# Patient Record
Sex: Female | Born: 1950
Health system: Southern US, Community
[De-identification: ages and names within clinical notes are randomized; demographics above are authoritative.]

## PROBLEM LIST (undated history)

## (undated) DIAGNOSIS — R9389 Abnormal findings on diagnostic imaging of other specified body structures: Secondary | ICD-10-CM

## (undated) DIAGNOSIS — R634 Abnormal weight loss: Secondary | ICD-10-CM

## (undated) DIAGNOSIS — K5909 Other constipation: Secondary | ICD-10-CM

## (undated) DIAGNOSIS — R110 Nausea: Secondary | ICD-10-CM

## (undated) DIAGNOSIS — Z9889 Other specified postprocedural states: Secondary | ICD-10-CM

## (undated) DIAGNOSIS — M1990 Unspecified osteoarthritis, unspecified site: Secondary | ICD-10-CM

## (undated) DIAGNOSIS — E079 Disorder of thyroid, unspecified: Secondary | ICD-10-CM

## (undated) DIAGNOSIS — F419 Anxiety disorder, unspecified: Secondary | ICD-10-CM

## (undated) DIAGNOSIS — K2970 Gastritis, unspecified, without bleeding: Secondary | ICD-10-CM

## (undated) DIAGNOSIS — K921 Melena: Secondary | ICD-10-CM

## (undated) DIAGNOSIS — E119 Type 2 diabetes mellitus without complications: Secondary | ICD-10-CM

## (undated) DIAGNOSIS — B9681 Helicobacter pylori [H. pylori] as the cause of diseases classified elsewhere: Secondary | ICD-10-CM

## (undated) HISTORY — DX: Other constipation: K59.09

## (undated) HISTORY — DX: Gastritis, unspecified, without bleeding: K29.70

## (undated) HISTORY — DX: Disorder of thyroid, unspecified: E07.9

## (undated) HISTORY — DX: Nausea: R11.0

## (undated) HISTORY — PX: ABDOMINAL HYSTERECTOMY: SHX81

## (undated) HISTORY — DX: Helicobacter pylori (H. pylori) as the cause of diseases classified elsewhere: B96.81

## (undated) HISTORY — DX: Abnormal weight loss: R63.4

## (undated) HISTORY — PX: CHOLECYSTECTOMY: SHX55

## (undated) HISTORY — DX: Type 2 diabetes mellitus without complications: E11.9

## (undated) HISTORY — DX: Melena: K92.1

## (undated) HISTORY — DX: Anxiety disorder, unspecified: F41.9

## (undated) HISTORY — DX: Other specified postprocedural states: Z98.890

---

## 1979-03-01 DIAGNOSIS — F419 Anxiety disorder, unspecified: Secondary | ICD-10-CM

## 1979-03-01 HISTORY — DX: Anxiety disorder, unspecified: F41.9

## 1987-07-01 HISTORY — PX: VESICOVAGINAL FISTULA CLOSURE W/ TAH: SUR271

## 2001-06-01 ENCOUNTER — Encounter: Payer: Self-pay | Admitting: Specialist

## 2001-06-01 ENCOUNTER — Ambulatory Visit (HOSPITAL_COMMUNITY): Admission: RE | Admit: 2001-06-01 | Discharge: 2001-06-01 | Payer: Self-pay | Admitting: Specialist

## 2001-09-14 ENCOUNTER — Ambulatory Visit (HOSPITAL_COMMUNITY): Admission: RE | Admit: 2001-09-14 | Discharge: 2001-09-14 | Payer: Self-pay | Admitting: Internal Medicine

## 2002-06-14 ENCOUNTER — Encounter: Payer: Self-pay | Admitting: Obstetrics and Gynecology

## 2002-06-14 ENCOUNTER — Ambulatory Visit (HOSPITAL_COMMUNITY): Admission: RE | Admit: 2002-06-14 | Discharge: 2002-06-14 | Payer: Self-pay | Admitting: Obstetrics and Gynecology

## 2002-08-05 ENCOUNTER — Encounter (HOSPITAL_COMMUNITY): Admission: RE | Admit: 2002-08-05 | Discharge: 2002-09-04 | Payer: Self-pay | Admitting: Cardiology

## 2003-06-19 ENCOUNTER — Ambulatory Visit (HOSPITAL_COMMUNITY): Admission: RE | Admit: 2003-06-19 | Discharge: 2003-06-19 | Payer: Self-pay | Admitting: Specialist

## 2003-07-01 HISTORY — PX: ESOPHAGOGASTRODUODENOSCOPY: SHX1529

## 2003-12-20 ENCOUNTER — Ambulatory Visit (HOSPITAL_COMMUNITY): Admission: RE | Admit: 2003-12-20 | Discharge: 2003-12-20 | Payer: Self-pay | Admitting: Pulmonary Disease

## 2004-04-30 DIAGNOSIS — B9681 Helicobacter pylori [H. pylori] as the cause of diseases classified elsewhere: Secondary | ICD-10-CM

## 2004-04-30 HISTORY — DX: Helicobacter pylori (H. pylori) as the cause of diseases classified elsewhere: B96.81

## 2004-05-15 ENCOUNTER — Ambulatory Visit (HOSPITAL_COMMUNITY): Payer: Self-pay | Admitting: Oncology

## 2004-05-15 ENCOUNTER — Encounter (HOSPITAL_COMMUNITY): Admission: RE | Admit: 2004-05-15 | Discharge: 2004-06-14 | Payer: Self-pay | Admitting: Oncology

## 2004-05-15 ENCOUNTER — Encounter: Admission: RE | Admit: 2004-05-15 | Discharge: 2004-05-15 | Payer: Self-pay | Admitting: Oncology

## 2004-05-27 ENCOUNTER — Ambulatory Visit: Payer: Self-pay | Admitting: Internal Medicine

## 2004-05-29 ENCOUNTER — Ambulatory Visit (HOSPITAL_COMMUNITY): Admission: RE | Admit: 2004-05-29 | Discharge: 2004-05-29 | Payer: Self-pay | Admitting: Internal Medicine

## 2004-07-03 ENCOUNTER — Ambulatory Visit: Payer: Self-pay | Admitting: Internal Medicine

## 2004-07-12 ENCOUNTER — Ambulatory Visit (HOSPITAL_COMMUNITY): Admission: RE | Admit: 2004-07-12 | Discharge: 2004-07-12 | Payer: Self-pay | Admitting: Specialist

## 2004-07-30 ENCOUNTER — Ambulatory Visit: Payer: Self-pay | Admitting: Internal Medicine

## 2004-08-28 ENCOUNTER — Ambulatory Visit: Payer: Self-pay | Admitting: Internal Medicine

## 2004-08-30 ENCOUNTER — Ambulatory Visit (HOSPITAL_COMMUNITY): Admission: RE | Admit: 2004-08-30 | Discharge: 2004-08-30 | Payer: Self-pay | Admitting: Internal Medicine

## 2004-10-10 ENCOUNTER — Ambulatory Visit (HOSPITAL_COMMUNITY): Admission: RE | Admit: 2004-10-10 | Discharge: 2004-10-10 | Payer: Self-pay | Admitting: Pulmonary Disease

## 2004-10-21 ENCOUNTER — Ambulatory Visit: Payer: Self-pay | Admitting: Internal Medicine

## 2005-05-20 ENCOUNTER — Ambulatory Visit (HOSPITAL_COMMUNITY): Admission: RE | Admit: 2005-05-20 | Discharge: 2005-05-20 | Payer: Self-pay | Admitting: Pulmonary Disease

## 2005-06-30 HISTORY — PX: OTHER SURGICAL HISTORY: SHX169

## 2005-07-30 ENCOUNTER — Ambulatory Visit: Payer: Self-pay | Admitting: Internal Medicine

## 2005-08-04 ENCOUNTER — Ambulatory Visit (HOSPITAL_COMMUNITY): Admission: RE | Admit: 2005-08-04 | Discharge: 2005-08-04 | Payer: Self-pay | Admitting: Internal Medicine

## 2005-08-13 ENCOUNTER — Encounter: Admission: RE | Admit: 2005-08-13 | Discharge: 2005-08-13 | Payer: Self-pay | Admitting: Obstetrics and Gynecology

## 2005-08-14 ENCOUNTER — Ambulatory Visit: Payer: Self-pay | Admitting: Internal Medicine

## 2005-08-26 ENCOUNTER — Encounter: Admission: RE | Admit: 2005-08-26 | Discharge: 2005-08-26 | Payer: Self-pay | Admitting: Obstetrics and Gynecology

## 2005-08-28 ENCOUNTER — Ambulatory Visit (HOSPITAL_COMMUNITY): Admission: RE | Admit: 2005-08-28 | Discharge: 2005-08-28 | Payer: Self-pay | Admitting: Internal Medicine

## 2005-08-28 ENCOUNTER — Ambulatory Visit: Payer: Self-pay | Admitting: Internal Medicine

## 2005-08-28 DIAGNOSIS — Z9889 Other specified postprocedural states: Secondary | ICD-10-CM

## 2005-08-28 HISTORY — DX: Other specified postprocedural states: Z98.890

## 2006-02-04 ENCOUNTER — Encounter: Admission: RE | Admit: 2006-02-04 | Discharge: 2006-02-04 | Payer: Self-pay | Admitting: Obstetrics and Gynecology

## 2006-08-17 ENCOUNTER — Encounter: Admission: RE | Admit: 2006-08-17 | Discharge: 2006-08-17 | Payer: Self-pay | Admitting: Obstetrics and Gynecology

## 2007-01-29 ENCOUNTER — Encounter: Admission: RE | Admit: 2007-01-29 | Discharge: 2007-01-29 | Payer: Self-pay | Admitting: Obstetrics and Gynecology

## 2007-07-28 ENCOUNTER — Ambulatory Visit (HOSPITAL_COMMUNITY): Admission: RE | Admit: 2007-07-28 | Discharge: 2007-07-28 | Payer: Self-pay | Admitting: Pulmonary Disease

## 2007-08-20 ENCOUNTER — Encounter: Admission: RE | Admit: 2007-08-20 | Discharge: 2007-08-20 | Payer: Self-pay | Admitting: Obstetrics and Gynecology

## 2007-08-23 ENCOUNTER — Other Ambulatory Visit: Admission: RE | Admit: 2007-08-23 | Discharge: 2007-08-23 | Payer: Self-pay | Admitting: Obstetrics and Gynecology

## 2007-12-20 ENCOUNTER — Ambulatory Visit (HOSPITAL_COMMUNITY): Admission: RE | Admit: 2007-12-20 | Discharge: 2007-12-20 | Payer: Self-pay | Admitting: Pulmonary Disease

## 2008-08-21 ENCOUNTER — Encounter: Admission: RE | Admit: 2008-08-21 | Discharge: 2008-08-21 | Payer: Self-pay | Admitting: Obstetrics and Gynecology

## 2008-09-11 ENCOUNTER — Ambulatory Visit (HOSPITAL_COMMUNITY): Admission: RE | Admit: 2008-09-11 | Discharge: 2008-09-11 | Payer: Self-pay | Admitting: Pulmonary Disease

## 2009-01-24 ENCOUNTER — Encounter: Payer: Self-pay | Admitting: Family Medicine

## 2009-06-30 HISTORY — PX: COLONOSCOPY: SHX174

## 2009-08-16 ENCOUNTER — Ambulatory Visit: Payer: Self-pay | Admitting: Family Medicine

## 2009-08-16 ENCOUNTER — Encounter: Payer: Self-pay | Admitting: Physician Assistant

## 2009-08-16 ENCOUNTER — Telehealth: Payer: Self-pay | Admitting: Family Medicine

## 2009-08-16 DIAGNOSIS — F411 Generalized anxiety disorder: Secondary | ICD-10-CM | POA: Insufficient documentation

## 2009-08-16 DIAGNOSIS — F329 Major depressive disorder, single episode, unspecified: Secondary | ICD-10-CM | POA: Insufficient documentation

## 2009-08-16 DIAGNOSIS — R5381 Other malaise: Secondary | ICD-10-CM | POA: Insufficient documentation

## 2009-08-16 DIAGNOSIS — R5383 Other fatigue: Secondary | ICD-10-CM

## 2009-08-23 ENCOUNTER — Encounter: Admission: RE | Admit: 2009-08-23 | Discharge: 2009-08-23 | Payer: Self-pay | Admitting: Family Medicine

## 2009-08-27 LAB — CONVERTED CEMR LAB
BUN: 15 mg/dL (ref 6–23)
Basophils Absolute: 0 10*3/uL (ref 0.0–0.1)
Basophils Relative: 0 % (ref 0–1)
Bilirubin, Direct: 0.1 mg/dL (ref 0.0–0.3)
Creatinine, Ser: 0.75 mg/dL (ref 0.40–1.20)
Eosinophils Absolute: 0 10*3/uL (ref 0.0–0.7)
Eosinophils Relative: 0 % (ref 0–5)
Glucose, Bld: 92 mg/dL (ref 70–99)
HCT: 44.4 % (ref 36.0–46.0)
Monocytes Relative: 12 % (ref 3–12)
Neutrophils Relative %: 65 % (ref 43–77)
Platelets: 228 10*3/uL (ref 150–400)
RBC: 4.85 M/uL (ref 3.87–5.11)
TSH: 0.764 microintl units/mL (ref 0.350–4.500)
Total Bilirubin: 0.5 mg/dL (ref 0.3–1.2)
Vit D, 25-Hydroxy: 43 ng/mL (ref 30–89)
WBC: 6.1 10*3/uL (ref 4.0–10.5)

## 2009-09-24 ENCOUNTER — Ambulatory Visit: Payer: Self-pay | Admitting: Internal Medicine

## 2009-09-24 DIAGNOSIS — K5909 Other constipation: Secondary | ICD-10-CM | POA: Insufficient documentation

## 2009-09-24 DIAGNOSIS — K921 Melena: Secondary | ICD-10-CM | POA: Insufficient documentation

## 2009-09-24 DIAGNOSIS — R634 Abnormal weight loss: Secondary | ICD-10-CM | POA: Insufficient documentation

## 2009-09-24 DIAGNOSIS — R110 Nausea: Secondary | ICD-10-CM | POA: Insufficient documentation

## 2009-10-01 ENCOUNTER — Encounter: Payer: Self-pay | Admitting: Internal Medicine

## 2009-10-23 ENCOUNTER — Ambulatory Visit (HOSPITAL_COMMUNITY): Admission: RE | Admit: 2009-10-23 | Discharge: 2009-10-23 | Payer: Self-pay | Admitting: Internal Medicine

## 2009-10-23 ENCOUNTER — Ambulatory Visit: Payer: Self-pay | Admitting: Internal Medicine

## 2009-10-25 ENCOUNTER — Encounter: Payer: Self-pay | Admitting: Internal Medicine

## 2009-10-26 ENCOUNTER — Ambulatory Visit: Payer: Self-pay | Admitting: Family Medicine

## 2009-10-26 DIAGNOSIS — M25569 Pain in unspecified knee: Secondary | ICD-10-CM | POA: Insufficient documentation

## 2009-10-26 DIAGNOSIS — R519 Headache, unspecified: Secondary | ICD-10-CM | POA: Insufficient documentation

## 2009-10-26 DIAGNOSIS — R079 Chest pain, unspecified: Secondary | ICD-10-CM | POA: Insufficient documentation

## 2009-10-26 DIAGNOSIS — R51 Headache: Secondary | ICD-10-CM | POA: Insufficient documentation

## 2009-11-02 ENCOUNTER — Ambulatory Visit (HOSPITAL_COMMUNITY): Admission: RE | Admit: 2009-11-02 | Discharge: 2009-11-02 | Payer: Self-pay | Admitting: Family Medicine

## 2009-11-19 ENCOUNTER — Ambulatory Visit (HOSPITAL_COMMUNITY): Payer: Self-pay | Admitting: Psychiatry

## 2009-11-30 ENCOUNTER — Ambulatory Visit (HOSPITAL_COMMUNITY): Payer: Self-pay | Admitting: Psychiatry

## 2009-12-26 ENCOUNTER — Ambulatory Visit (HOSPITAL_COMMUNITY): Payer: Self-pay | Admitting: Psychiatry

## 2009-12-27 ENCOUNTER — Other Ambulatory Visit: Admission: RE | Admit: 2009-12-27 | Discharge: 2009-12-27 | Payer: Self-pay | Admitting: Family Medicine

## 2009-12-27 ENCOUNTER — Ambulatory Visit: Payer: Self-pay | Admitting: Family Medicine

## 2009-12-27 DIAGNOSIS — E782 Mixed hyperlipidemia: Secondary | ICD-10-CM | POA: Insufficient documentation

## 2009-12-27 DIAGNOSIS — E785 Hyperlipidemia, unspecified: Secondary | ICD-10-CM | POA: Insufficient documentation

## 2009-12-28 LAB — CONVERTED CEMR LAB
ALT: 19 units/L (ref 0–35)
Albumin: 4.5 g/dL (ref 3.5–5.2)
Cholesterol: 164 mg/dL (ref 0–200)
HDL: 70 mg/dL (ref >39)
Total CHOL/HDL Ratio: 2.3
Total Protein: 7.2 g/dL (ref 6.0–8.3)
Triglycerides: 69 mg/dL (ref <150)

## 2010-01-02 LAB — CONVERTED CEMR LAB: Pap Smear: NEGATIVE

## 2010-01-09 ENCOUNTER — Ambulatory Visit (HOSPITAL_COMMUNITY): Payer: Self-pay | Admitting: Psychiatry

## 2010-01-29 ENCOUNTER — Ambulatory Visit (HOSPITAL_COMMUNITY): Payer: Self-pay | Admitting: Psychiatry

## 2010-02-13 ENCOUNTER — Ambulatory Visit (HOSPITAL_COMMUNITY): Payer: Self-pay | Admitting: Psychiatry

## 2010-02-27 ENCOUNTER — Ambulatory Visit (HOSPITAL_COMMUNITY): Payer: Self-pay | Admitting: Psychiatry

## 2010-03-13 ENCOUNTER — Ambulatory Visit: Payer: Self-pay | Admitting: Family Medicine

## 2010-03-13 DIAGNOSIS — N3090 Cystitis, unspecified without hematuria: Secondary | ICD-10-CM | POA: Insufficient documentation

## 2010-03-13 DIAGNOSIS — R109 Unspecified abdominal pain: Secondary | ICD-10-CM | POA: Insufficient documentation

## 2010-03-13 DIAGNOSIS — B369 Superficial mycosis, unspecified: Secondary | ICD-10-CM | POA: Insufficient documentation

## 2010-03-13 DIAGNOSIS — N3020 Other chronic cystitis without hematuria: Secondary | ICD-10-CM | POA: Insufficient documentation

## 2010-03-13 LAB — CONVERTED CEMR LAB
Glucose, Urine, Semiquant: NEGATIVE
Protein, U semiquant: NEGATIVE
pH: 7

## 2010-03-18 ENCOUNTER — Ambulatory Visit (HOSPITAL_COMMUNITY): Admission: RE | Admit: 2010-03-18 | Discharge: 2010-03-18 | Payer: Self-pay | Admitting: Family Medicine

## 2010-03-21 ENCOUNTER — Ambulatory Visit (HOSPITAL_COMMUNITY): Payer: Self-pay | Admitting: Psychiatry

## 2010-04-10 ENCOUNTER — Ambulatory Visit (HOSPITAL_COMMUNITY): Payer: Self-pay | Admitting: Psychiatry

## 2010-04-17 ENCOUNTER — Ambulatory Visit: Payer: Self-pay | Admitting: Family Medicine

## 2010-04-17 DIAGNOSIS — J309 Allergic rhinitis, unspecified: Secondary | ICD-10-CM | POA: Insufficient documentation

## 2010-04-17 DIAGNOSIS — B009 Herpesviral infection, unspecified: Secondary | ICD-10-CM | POA: Insufficient documentation

## 2010-05-20 ENCOUNTER — Ambulatory Visit (HOSPITAL_COMMUNITY): Payer: Self-pay | Admitting: Psychiatry

## 2010-05-28 LAB — CONVERTED CEMR LAB
AST: 30 units/L (ref 0–37)
Alkaline Phosphatase: 53 units/L (ref 39–117)
Bilirubin, Direct: 0.1 mg/dL (ref 0.0–0.3)
Cholesterol: 202 mg/dL — ABNORMAL HIGH (ref 0–200)
Indirect Bilirubin: 0.3 mg/dL (ref 0.0–0.9)
Total Bilirubin: 0.4 mg/dL (ref 0.3–1.2)
Total CHOL/HDL Ratio: 2.7

## 2010-06-10 ENCOUNTER — Ambulatory Visit (HOSPITAL_COMMUNITY): Payer: Self-pay | Admitting: Psychiatry

## 2010-06-17 ENCOUNTER — Ambulatory Visit: Payer: Self-pay | Admitting: Family Medicine

## 2010-06-17 DIAGNOSIS — H6690 Otitis media, unspecified, unspecified ear: Secondary | ICD-10-CM | POA: Insufficient documentation

## 2010-07-15 ENCOUNTER — Ambulatory Visit (HOSPITAL_COMMUNITY): Admit: 2010-07-15 | Payer: Self-pay | Admitting: Psychiatry

## 2010-07-19 ENCOUNTER — Ambulatory Visit
Admission: RE | Admit: 2010-07-19 | Discharge: 2010-07-19 | Payer: Self-pay | Source: Home / Self Care | Attending: Family Medicine | Admitting: Family Medicine

## 2010-07-19 DIAGNOSIS — H9209 Otalgia, unspecified ear: Secondary | ICD-10-CM | POA: Insufficient documentation

## 2010-07-19 DIAGNOSIS — J029 Acute pharyngitis, unspecified: Secondary | ICD-10-CM | POA: Insufficient documentation

## 2010-07-19 DIAGNOSIS — L259 Unspecified contact dermatitis, unspecified cause: Secondary | ICD-10-CM | POA: Insufficient documentation

## 2010-07-19 LAB — CONVERTED CEMR LAB: Rapid Strep: NEGATIVE

## 2010-07-21 ENCOUNTER — Encounter: Payer: Self-pay | Admitting: Specialist

## 2010-07-26 ENCOUNTER — Encounter: Payer: Self-pay | Admitting: Family Medicine

## 2010-08-01 NOTE — Assessment & Plan Note (Signed)
Summary: f up   Vital Signs:  Patient profile:   60 year old female Menstrual status:  hysterectomy Height:      67 inches Weight:      126.75 pounds BMI:     19.92 O2 Sat:      98 % Pulse rate:   67 / minute Pulse rhythm:   regular Resp:     16 per minute BP sitting:   120 / 80  (left arm)  Vitals Entered By: Everitt Amber LPN (March 13, 2010 10:47 AM) CC: Follow up chronic problems   Primary Care Kahleb Mcclane:  Lodema Hong  CC:  Follow up chronic problems.  History of Present Illness: Reports  that she has been doing better  since she started therapy. She does experience panic attacks which are not new, and states that her medication does not seem to be helping her much. She c/o lower pelvic pain, was told at ontime this was due to scar tissue.she is concerned and wants this checked.   Denies recent fever or chills. Denies sinus pressure, nasal congestion , ear pain or sore throat. Denies chest congestion, or cough productive of sputum. Denies chest pain, palpitations, PND, orthopnea or leg swelling. Denies abdominal pain, nausea, vomitting, diarrhea or constipation. Denies change in bowel movements or bloody stool. Denies dysuria , frequency, incontinence or hesitancy. Denies  joint pain, swelling, or reduced mobility. Denies headaches, vertigo, seizures. Denies depression, anxiety or insomnia. She still c/o itching in herscalp    Current Medications (verified): 1)  Diazepam 5 Mg Tabs (Diazepam) .... Take 1 Tablet By Mouth Two Times A Day As Needed 2)  Travatan Z 0.004 % Soln (Travoprost) .Marland Kitchen.. 1 Drop in Each Eye Every Evening 3)  Centrum Silver  Tabs (Multiple Vitamins-Minerals) .... Take 1 Tablet By Mouth Once A Day 4)  Derma-Smoothe/fs Body 0.01 % Oil (Fluocinolone Acetonide) .... Apply To Scalp After Shampoo 5)  Ketoconazole 2 % Sham (Ketoconazole) .... Apply To Scalp Once A Week 6)  Hydroxyzine Hcl 10 Mg Tabs (Hydroxyzine Hcl) .Marland Kitchen.. 1-3 Tabs At Bedtime 7)  Zocor 20  Mg Tabs (Simvastatin) .... Take 1 Tab By Mouth At Bedtime  Allergies (verified): No Known Drug Allergies  Past History:  Past Medical History: Anxiety-1980's Glaucoma since 2004  (D.>Whittaker), laser to left  eye  Sept 2011 WEIGHT LOSS (ICD-783.21) NAUSEA, CHRONIC (ICD-787.02) HEMATOCHEZIA (ICD-578.1) Chronic constipation DEPRESSION (ICD-311) negative celiac AB  Last colonoscopy Dr Jena Gauss 08/2005->hemorrhoids h pylori gastritis, last EGD 04/2004  Review of Systems      See HPI Eyes:  Denies discharge, eye pain, and red eye. Derm:  Complains of lesion(s) and rash; round red lesion on right thigh x 2 weeks, raised edge. Psych:  Complains of anxiety; denies depression; symptoms have improved tremendously withtherapy, she states she is glad that she is involved with this. Endo:  Denies cold intolerance, excessive hunger, excessive thirst, and excessive urination. Heme:  Denies abnormal bruising and bleeding. Allergy:  Denies hives or rash and itching eyes.  Physical Exam  General:  Well-developed,well-nourished,in no acute distress; alert,appropriate and cooperative throughout examination HEENT: No facial asymmetry,  EOMI, No sinus tenderness, TM's Clear, oropharynx  pink and moist.   Chest: Clear to auscultation bilaterally.  CVS: S1, S2, No murmurs, No S3.   Abd: Soft, Nontender.  MS: Adequate ROM spine, hips, shoulders and knees.  Ext: No edema.   CNS: CN 2-12 intact, power tone and sensation normal throughout.   Skin: Intact,fungal infectio onm right thigh Psych:  Good eye contact, normal affect.  Memory intact, not anxious or depressed appearing.    Impression & Recommendations:  Problem # 1:  PELVIC PAIN, CHRONIC (ICD-789.09) Assessment Unchanged  Orders: Radiology Referral (Radiology)  Problem # 2:  HYPERLIPIDEMIA (ICD-272.4) Assessment: Improved  Her updated medication list for this problem includes:    Zocor 20 Mg Tabs (Simvastatin) .Marland Kitchen... Take 1 tab by mouth  at bedtime Low fat diet discussed and encouraged, and literature also given  Orders: T-Hepatic Function [phone]) T-Lipid Profile [phone])  Labs Reviewed: SGOT: 21 (12/27/2009)   SGPT: 19 (12/27/2009)   HDL:70 (12/27/2009)  LDL:80 (12/27/2009)  Chol:164 (12/27/2009)  Trig:69 (12/27/2009)  Problem # 3:  ANXIETY (ICD-300.00) Assessment: Improved  The following medications were removed from the medication list:    Diazepam 5 Mg Tabs (Diazepam) .Marland Kitchen... Take 1 tablet by mouth two times a day as needed Her updated medication list for this problem includes:    Hydroxyzine Hcl 10 Mg Tabs (Hydroxyzine hcl) .Marland Kitchen... 1-3 tabs at bedtime    Buspirone Hcl 5 Mg Tabs (Buspirone hcl) .Marland Kitchen... Take 1 tablet by mouth two times a day    Ativan 0.5 Mg Tabs (Lorazepam) ..... Half to one tablet to be taken 30 minutes before anticipated panic situation  Problem # 4:  DERMATOMYCOSIS (ICD-111.9) Assessment: Comment Only  Her updated medication list for this problem includes:    Ketoconazole 2 % Sham (Ketoconazole) .Marland Kitchen... Apply to scalp once a week    Clotrimazole-betamethasone 1-0.05 % Lotn (Clotrimazole-betamethasone) .Marland Kitchen... Apply twice daily to affected area for 10 days, then as needed  Complete Medication List: 1)  Travatan Z 0.004 % Soln (Travoprost) .Marland Kitchen.. 1 drop in each eye every evening 2)  Centrum Silver Tabs (Multiple vitamins-minerals) .... Take 1 tablet by mouth once a day 3)  Derma-smoothe/fs Body 0.01 % Oil (Fluocinolone acetonide) .... Apply to scalp after shampoo 4)  Ketoconazole 2 % Sham (Ketoconazole) .... Apply to scalp once a week 5)  Hydroxyzine Hcl 10 Mg Tabs (Hydroxyzine hcl) .Marland Kitchen.. 1-3 tabs at bedtime 6)  Zocor 20 Mg Tabs (Simvastatin) .... Take 1 tab by mouth at bedtime 7)  Buspirone Hcl 5 Mg Tabs (Buspirone hcl) .... Take 1 tablet by mouth two times a day 8)  Clotrimazole-betamethasone 1-0.05 % Lotn (Clotrimazole-betamethasone) .... Apply twice daily to affected area for 10 days, then  as needed 9)  Oscal 500/200 D-3 500-200 Mg-unit Tabs (Calcium-vitamin d) .... Take 1 tablet by mouth three times a day 10)  Ativan 0.5 Mg Tabs (Lorazepam) .... Half to one tablet to be taken 30 minutes before anticipated panic situation  Other Orders: UA Dipstick W/ Micro (manual) (30865) Influenza Vaccine NON MCR (78469)  Patient Instructions: 1)  Please schedule a follow-up appointment in 3 months. 2)  You will be referred for a pelvic US to eval pain. 3)  congrats on weight gain, you are obviously doing much better.Continue therapy and exercise. 4)  Pls start eating more regularly as discussed. 5)  New med for chronic anxiety , also for panic attacks.stop diazepam. 6)  script will be given for the rash 7)  pls start taking calcium regularly for your bones, med has been sent in. Prescriptions: ATIVAN 0.5 MG TABS (LORAZEPAM) half to one tablet to be taken 30 minutes before anticipated panic situation  #15 x 0   Entered and Authorized by:   Syliva Overman MD   Signed by:   Syliva Overman MD on 03/13/2010   Method used:   Printed then  faxed to ...       [facility]* (retail)       [address]       [place], [place]  [address]       Ph: [phone]       Fax: [phone]   RxID:   [id number] OSCAL 500/200 D-3 500-200 MG-UNIT TABS (CALCIUM-VITAMIN D) Take 1 tablet by mouth three times a day  #90 x 11   Entered and Authorized by:   Syliva Overman MD   Signed by:   Syliva Overman MD on 03/13/2010   Method used:   Electronically to        [facility]* (retail)       [address] [place], [place]  [address]       Ph: [phone]       Fax: [phone]   RxID:   [id number] CLOTRIMAZOLE-BETAMETHASONE 1-0.05 % LOTN (CLOTRIMAZOLE-BETAMETHASONE) apply twice daily to affected area for 10 days, then as needed  #45gm x 0   Entered and Authorized by:   Syliva Overman MD   Signed by:   Syliva Overman MD on 03/13/2010   Method used:   Print then Give to Patient   RxID:   [id number] BUSPIRONE HCL 5 MG TABS (BUSPIRONE HCL) Take 1 tablet by mouth two times a day  #60 x 3   Entered and Authorized by:   Syliva Overman MD   Signed by:   Syliva Overman MD on 03/13/2010   Method used:   Electronically to        [facility] (retail)       [address]       [place], [place]  [address]       Ph: [phone]       Fax: [phone]   RxID:   [id number]    Laboratory Results   Urine Tests    Routine Urinalysis   Color: lt. yellow Glucose: negative   (Normal Range: Negative) Bilirubin: negative   (Normal Range: Negative) Ketone: negative   (Normal Range: Negative) Spec. Gravity: 1.015   (Normal Range: 1.003-1.035) Blood: negative   (Normal Range: Negative) pH: 7.0   (Normal Range: 5.0-8.0) Protein: negative   (Normal Range: Negative) Urobilinogen: 0.2   (Normal Range: 0-1) Nitrite: negative   (Normal Range: Negative) Leukocyte Esterace: negative   (Normal Range: Negative)        Influenza Vaccine    Vaccine Type: Fluvax Non-MCR    Site: right deltoid    Mfr: novartis     Dose: 0.5 ml    Route: IM    Given by: Everitt Amber LPN    Exp. Date: 10/2010    Lot #: [id number]

## 2010-08-01 NOTE — Assessment & Plan Note (Signed)
Summary: office visit   Vital Signs:  Patient profile:   60 year old female Menstrual status:  hysterectomy Height:      67 inches Weight:      120.25 pounds BMI:     18.90 O2 Sat:      97 % Pulse rate:   91 / minute Pulse rhythm:   regular Resp:     16 per minute BP sitting:   100 / 70 Cuff size:   regular  Vitals Entered By: Everitt Amber LPN (October 26, 2009 10:56 AM) CC: Follow up chronic problems   Primary Care Provider:  Lodema Hong  CC:  Follow up chronic problems.  History of Present Illness: The pt comes in with multiple concerns. She recently had a negative colonscopy, which is a relief for her. She continues to experience symptoms of uncontrolled de[pression, she states she was intolerant of the med I put her on ,and goes on to state that she has a long h/o intolerance to depressio  med, has been hospitalised in the past for depression, and is willingto see psychology and psychioatry at this time since she really does not feel well. She denies being either suicidal por homicidal.  She is concerned about hair loss, statews she breaks out intermittentl;y in areas whiuch are tender and puritic in her scalp, dermatology is currently working on this.  She reports increased frequency and severity of headaches, and wonders if something inside of her head is causing the outbreaks on her scalp She denies neurologic deficits associated with the headaches   Current Medications (verified): 1)  Diazepam 5 Mg Tabs (Diazepam) .... Take 1 Tablet By Mouth Two Times A Day As Needed 2)  Travatan Z 0.004 % Soln (Travoprost) .Marland Kitchen.. 1 Drop in Each Eye Every Evening 3)  Centrum Silver  Tabs (Multiple Vitamins-Minerals) .... Take 1 Tablet By Mouth Once A Day 4)  Derma-Smoothe/fs Body 0.01 % Oil (Fluocinolone Acetonide) .... Apply To Scalp After Shampoo 5)  Ketoconazole 2 % Sham (Ketoconazole) .... Apply To Scalp Once A Week 6)  Hydroxyzine Hcl 10 Mg Tabs (Hydroxyzine Hcl) .Marland Kitchen.. 1-3 Tabs At  Bedtime  Allergies (verified): No Known Drug Allergies  Review of Systems      See HPI General:  Complains of fatigue; denies chills and fever. Eyes:  Denies blurring, discharge, eye pain, and red eye. ENT:  Denies earache, hoarseness, nasal congestion, sinus pressure, and sore throat. CV:  Complains of chest pain or discomfort; denies difficulty breathing while lying down, palpitations, and swelling of feet; intermittent chest pain, substernal, no association with activity specifically, non radiating, no associated lightheadedness, nausea or diaphoresis. Resp:  Denies cough and sputum productive. GI:  Denies abdominal pain, constipation, diarrhea, nausea, and vomiting. GU:  intermittent vaginal itch x 1 year, deny any d/c. MS:  Complains of joint pain; arthroscopy right knee in the past,  2007has noterd some instability in the knee over the past severAL months, approx 4 monyths in all. Derm:  Complains of hair loss, itching, lesion(s), and rash. Neuro:  Complains of headaches; denies poor balance, seizures, and sensation of room spinning. Psych:  Complains of anxiety, depression, easily tearful, irritability, and mental problems; denies suicidal thoughts/plans, thoughts of violence, and unusual visions or sounds; unable to tolerate sertraline want s therapy and psych. Endo:  Denies excessive thirst, excessive urination, and heat intolerance. Heme:  Denies abnormal bruising and bleeding. Allergy:  Denies hives or rash and itching eyes.  Physical Exam  General:  Well-developed,well-nourished,in no  acute distress; alert,appropriate and cooperative throughout examination HEENT: No facial asymmetry,  EOMI, No sinus tenderness, TM's Clear, oropharynx  pink and moist.   Chest: Clear to auscultation bilaterally.  CVS: S1, S2, No murmurs, No S3.   Abd: Soft, Nontender.  MS: Adequate ROM spine, hips, shoulders and reduced in knee, with crepitus  Ext: No edema.   CNS: CN 2-12 intact, power tone  and sensation normal throughout.   Skin: Intact, erythemaatous scalp lesions with excoriations present where pt has scraTCHED, no purulent drainage Psych: Good eye contact and depressed appearing.Also anxious    Impression & Recommendations:  Problem # 1:  CHEST PAIN UNSPECIFIED (ICD-786.50)  Orders: EKG w/ Interpretation (93000)NSRT with no ischemia or hypertrophy  Problem # 2:  HEADACHE (ICD-784.0) Assessment: Deteriorated  Orders: Radiology Referral (Radiology)  Problem # 3:  KNEE PAIN, RIGHT (BMW-413.24) Assessment: Deteriorated  Orders: Orthopedic Referral (Ortho)  Problem # 4:  DEPRESSION (ICD-311)  Her updated medication list for this problem includes:    Diazepam 5 Mg Tabs (Diazepam) .Marland Kitchen... Take 1 tablet by mouth two times a day as needed    Hydroxyzine Hcl 10 Mg Tabs (Hydroxyzine hcl) .Marland Kitchen... 1-3 tabs at bedtime  Orders: Psychology Referral (Psychology)  Problem # 5:  ANXIETY (ICD-300.00) Assessment: Deteriorated  Her updated medication list for this problem includes:    Diazepam 5 Mg Tabs (Diazepam) .Marland Kitchen... Take 1 tablet by mouth two times a day as needed    Hydroxyzine Hcl 10 Mg Tabs (Hydroxyzine hcl) .Marland Kitchen... 1-3 tabs at bedtime  Complete Medication List: 1)  Diazepam 5 Mg Tabs (Diazepam) .... Take 1 tablet by mouth two times a day as needed 2)  Travatan Z 0.004 % Soln (Travoprost) .Marland Kitchen.. 1 drop in each eye every evening 3)  Centrum Silver Tabs (Multiple vitamins-minerals) .... Take 1 tablet by mouth once a day 4)  Derma-smoothe/fs Body 0.01 % Oil (Fluocinolone acetonide) .... Apply to scalp after shampoo 5)  Ketoconazole 2 % Sham (Ketoconazole) .... Apply to scalp once a week 6)  Hydroxyzine Hcl 10 Mg Tabs (Hydroxyzine hcl) .Marland Kitchen.. 1-3 tabs at bedtime  Patient Instructions: 1)  cPE in 4 to 8 weeks. 2)  you will be referred to ortho, and for an MRI of the brain and to psyxchology/psychiatry

## 2010-08-01 NOTE — Letter (Signed)
Summary: Internal Other /Procedure schedule  Internal Other /Procedure schedule   Imported By: Cloria Spring LPN 04/54/0981 19:14:78  _____________________________________________________________________  External Attachment:    Type:   Image     Comment:   External Document

## 2010-08-01 NOTE — Assessment & Plan Note (Signed)
Summary: office visit   Vital Signs:  Patient profile:   60 year old female Menstrual status:  hysterectomy Height:      67 inches Weight:      121.75 pounds BMI:     19.14 O2 Sat:      99 % Pulse rate:   87 / minute Pulse rhythm:   regular Resp:     16 per minute BP sitting:   120 / 82  (left arm) Cuff size:   regular  Vitals Entered By: Everitt Amber LPN (December 27, 2009 10:41 AM) CC: here for CPE    Primary Care Provider:  Lodema Hong  CC:  here for CPE .  History of Present Illness: Reports  that she has been doing fairly well. She has now started seeing a therapist , which she finds helpful, she is considering an appt with the psychiatrist for medication management also, I fully encourage herto do this. Denies recent fever or chills. Denies sinus pressure, nasal congestion , ear pain or sore throat. Denies chest congestion, or cough productive of sputum. Denies chest pain, palpitations, PND, orthopnea or leg swelling. Denies abdominal pain, nausea, vomitting, diarrhea or constipation. Denies change in bowel movements or bloody stool. Denies dysuria , frequency, incontinence or hesitancy. Denies  joint pain, swelling, or reduced mobility. Denies headaches, vertigo, seizures.Denies depression, anxiety or insomnia. Stillc/o generalised puritus, no visible rash, few scattered scratch marks, following with dermatology.     Current Medications (verified): 1)  Diazepam 5 Mg Tabs (Diazepam) .... Take 1 Tablet By Mouth Two Times A Day As Needed 2)  Travatan Z 0.004 % Soln (Travoprost) .Marland Kitchen.. 1 Drop in Each Eye Every Evening 3)  Centrum Silver  Tabs (Multiple Vitamins-Minerals) .... Take 1 Tablet By Mouth Once A Day 4)  Derma-Smoothe/fs Body 0.01 % Oil (Fluocinolone Acetonide) .... Apply To Scalp After Shampoo 5)  Ketoconazole 2 % Sham (Ketoconazole) .... Apply To Scalp Once A Week 6)  Hydroxyzine Hcl 10 Mg Tabs (Hydroxyzine Hcl) .Marland Kitchen.. 1-3 Tabs At Bedtime 7)  Zocor 20 Mg Tabs  (Simvastatin) .... Take 1 Tab By Mouth At Bedtime  Allergies (verified): No Known Drug Allergies  Review of Systems      See HPI Eyes:  Denies blurring, discharge, eye pain, and red eye. Psych:  Complains of anxiety, depression, and mental problems; denies suicidal thoughts/plans, thoughts of violence, and unusual visions or sounds. Endo:  Denies cold intolerance, excessive thirst, excessive urination, polyuria, and weight change. Heme:  Denies abnormal bruising and bleeding. Allergy:  Complains of seasonal allergies; denies hives or rash and itching eyes.  Physical Exam  General:  Well-developed,well-nourished,in no acute distress; alert,appropriate and cooperative throughout examination Head:  Normocephalic and atraumatic without obvious abnormalities. No apparent alopecia or balding. Eyes:  No corneal or conjunctival inflammation noted. EOMI. Perrla. Funduscopic exam benign, without hemorrhages, exudates or papilledema. Vision grossly normal. Ears:  External ear exam shows no significant lesions or deformities.  Otoscopic examination reveals clear canals, tympanic membranes are intact bilaterally without bulging, retraction, inflammation or discharge. Hearing is grossly normal bilaterally. Nose:  External nasal examination shows no deformity or inflammation. Nasal mucosa are pink and moist without lesions or exudates. Mouth:  Oral mucosa and oropharynx without lesions or exudates.  Teeth in good repair. Neck:  No deformities, masses, or tenderness noted. Chest Wall:  No deformities, masses, or tenderness noted. Breasts:  No mass, nodules, thickening, tenderness, bulging, retraction, inflamation, nipple discharge or skin changes noted.   Lungs:  Normal  respiratory effort, chest expands symmetrically. Lungs are clear to auscultation, no crackles or wheezes. Heart:  Normal rate and regular rhythm. S1 and S2 normal without gallop, murmur, click, rub or other extra sounds. Abdomen:  Bowel  sounds positive,abdomen soft and non-tender without masses, organomegaly or hernias noted. Rectal:  No external abnormalities noted. Normal sphincter tone. No rectal masses or tenderness.guaic negative stool Genitalia:  normal introitus, no external lesions, no vaginal discharge, mucosa pink and moist, and no adnexal masses or tenderness.  uterus absent Msk:  No deformity or scoliosis noted of thoracic or lumbar spine.   Pulses:  R and L carotid,radial,femoral,dorsalis pedis and posterior tibial pulses are full and equal bilaterally Extremities:  No clubbing, cyanosis, edema, or deformity noted with normal full range of motion of all joints.   Neurologic:  No cranial nerve deficits noted. Station and gait are normal. Plantar reflexes are down-going bilaterally. DTRs are symmetrical throughout. Sensory, motor and coordinative functions appear intact. Skin:  Intact without suspicious lesions or rashes Cervical Nodes:  No lymphadenopathy noted Axillary Nodes:  No palpable lymphadenopathy Inguinal Nodes:  No significant adenopathy Psych:  Cognition and judgment appear intact. Alert and cooperative with normal attention span and concentration. No apparent delusions, illusions, hallucinations   Impression & Recommendations:  Problem # 1:  SCREENING FOR MALIGNANT NEOPLASM OF THE CERVIX (ICD-V76.2) Assessment Comment Only  Orders: Pap Smear (11914)  Problem # 2:  HYPERLIPIDEMIA (ICD-272.4) Assessment: Comment Only  Her updated medication list for this problem includes:    Zocor 20 Mg Tabs (Simvastatin) .Marland Kitchen... Take 1 tab by mouth at bedtime  Orders: T-Hepatic Function [id number]) T-Lipid Profile [id number])  Labs Reviewed: SGOT: 21 (08/16/2009)   SGPT: 18 (08/16/2009), will review f/u labs  Problem # 3:  DEPRESSION (ICD-311) Assessment: Improved  Her updated medication list for this problem includes:    Diazepam 5 Mg Tabs (Diazepam) .Marland Kitchen... Take 1 tablet by mouth two times a day as  needed    Hydroxyzine Hcl 10 Mg Tabs (Hydroxyzine hcl) .Marland Kitchen... 1-3 tabs at bedtime still believe strongly pt needs a mood stabilser/antidepressant, she is open to psych eval  Problem # 4:  ANXIETY (ICD-300.00) Assessment: Unchanged  Her updated medication list for this problem includes:    Diazepam 5 Mg Tabs (Diazepam) .Marland Kitchen... Take 1 tablet by mouth two times a day as needed    Hydroxyzine Hcl 10 Mg Tabs (Hydroxyzine hcl) .Marland Kitchen... 1-3 tabs at bedtime  Complete Medication List: 1)  Diazepam 5 Mg Tabs (Diazepam) .... Take 1 tablet by mouth two times a day as needed 2)  Travatan Z 0.004 % Soln (Travoprost) .Marland Kitchen.. 1 drop in each eye every evening 3)  Centrum Silver Tabs (Multiple vitamins-minerals) .... Take 1 tablet by mouth once a day 4)  Derma-smoothe/fs Body 0.01 % Oil (Fluocinolone acetonide) .... Apply to scalp after shampoo 5)  Ketoconazole 2 % Sham (Ketoconazole) .... Apply to scalp once a week 6)  Hydroxyzine Hcl 10 Mg Tabs (Hydroxyzine hcl) .Marland Kitchen.. 1-3 tabs at bedtime 7)  Zocor 20 Mg Tabs (Simvastatin) .... Take 1 tab by mouth at bedtime  Patient Instructions: 1)  Please schedule a follow-up appointment in 2.5 months. 2)  Hepatic Panel prior to visit, ICD-9: 3)  Lipid Panel prior to visit, ICD-9:   fasting today. 4)  I really do encourage you to get treatment from the psychiatrist also, and to continue to go to therapy.

## 2010-08-01 NOTE — Progress Notes (Signed)
Summary: referral to breast center  Phone Note Call from Patient   Summary of Call: order pt a appt at breast center. she is to go on 08/23/2009 1:50. pt was notified before leaving office. Initial call taken by: Rudene Anda,  August 16, 2009 1:19 PM

## 2010-08-01 NOTE — Assessment & Plan Note (Signed)
Summary: new pt   Vital Signs:  Patient profile:   60 year old female Menstrual status:  hysterectomy Height:      67 inches Weight:      115 pounds BMI:     18.08 O2 Sat:      98 % Pulse rate:   106 / minute Pulse rhythm:   regular Resp:     16 per minute BP sitting:   104 / 70  (left arm) Cuff size:   regular  Vitals Entered By: Everitt Amber LPN (August 16, 2009 10:26 AM) CC: NEW PATIENT     Menstrual Status hysterectomy   CC:  NEW PATIENT.  History of Present Illness: New pt evaluation. She has a previous h/o poor apetite and weight loss down to 99 pounds over a  3 year period She states in the past 1 month she had an episode of constipation, was checked by her past primary  and was told she had the recrtal bleed because  of hemmorhoids. She sttaes every since this she has had some nausea whenever she eats and states she sometimes has back pain with it . She reports chronic depression, and is only on an anxiolytic. SDhe has had this for years, and denies psych hospitalisation. She denies any recent fever or chills. She denies any current head or chest congestion. She denes dysuria , frequency but has  incontinence  Preventive Screening-Counseling & Management  Alcohol-Tobacco     Smoking Status: never  Caffeine-Diet-Exercise     Does Patient Exercise: yes      Drug Use:  no.    Current Medications (verified): 1)  Diazepam 5 Mg Tabs (Diazepam) .... Take 1 Tablet By Mouth Two Times A Day As Needed 2)  Travatan Z 0.004 % Soln (Travoprost) .Marland Kitchen.. 1 Drop in Each Eye Every Evening 3)  Centrum Silver  Tabs (Multiple Vitamins-Minerals) .... Take 1 Tablet By Mouth Once A Day  Allergies (verified): No Known Drug Allergies  Past History:  Family History: Last updated: 08/16/2009 Mother-Living-HTN, glacoma, diverticulosis Father-Deceased-Lung Cancer 3 sisters-1 sister, mentally challenged-2 healthy 1 brother-Healthy, GERD  Social History: Last updated:  08/16/2009 Occupation: homemaker Married Never Smoked Alcohol use-no Drug use-no Regular exercise-yes  Risk Factors: Exercise: yes (08/16/2009)  Risk Factors: Smoking Status: never (08/16/2009)  Past Medical History: Anxiety-1980's Glaucoma since 2004  (D.>Whittaker)  Past Surgical History: Cholecystectomy-1990's Hysterectomy-1989 arthoscopic surgery on right knee-2007  Family History: Mother-Living-HTN, glacoma, diverticulosis Father-Deceased-Lung Cancer 3 sisters-1 sister, mentally challenged-2 healthy 1 brother-Healthy, GERD  Social History: Occupation: homemaker Married Never Smoked Alcohol use-no Drug use-no Regular exercise-yes Smoking Status:  never Drug Use:  no Does Patient Exercise:  yes  Review of Systems      See HPI General:  Complains of fatigue, loss of appetite, and weight loss; denies chills and fever. Eyes:  Complains of vision loss-both eyes; glaucoma. ENT:  Denies earache, hoarseness, nasal congestion, and sinus pressure. CV:  Denies chest pain or discomfort, difficulty breathing while lying down, palpitations, and swelling of feet. Resp:  Complains of cough; denies shortness of breath and sputum productive; intermittent. GI:  Complains of diarrhea and vomiting; 3 day history, with chills and body aches, states in 2005 she had loss of apetite, lost down to 97 pounds,, she had at most 5 watery stools and has vomitted at most oncel. GU:  Complains of incontinence. MS:  Denies joint pain, low back pain, mid back pain, and stiffness. Derm:  Denies changes in color of skin and  dryness. Neuro:  Denies headaches, seizures, and sensation of room spinning. Endo:  Denies cold intolerance, excessive hunger, excessive thirst, excessive urination, heat intolerance, polyuria, and weight change. Heme:  Denies abnormal bruising and bleeding. Allergy:  Denies hives or rash.  Physical Exam  General:  Well-developed,well-nourished,in no acute distress;  alert,appropriate and cooperative throughout examination HEENT: No facial asymmetry,  EOMI, No sinus tenderness, TM's Clear, oropharynx  pink and moist.   Chest: Clear to auscultation bilaterally.  CVS: S1, S2, No murmurs, No S3.   Abd: Soft, Nontender.  MS: Adequate ROM spine, hips, shoulders and knees.  Ext: No edema.   CNS: CN 2-12 intact, power tone and sensation normal throughout.   Skin: Intact, no visible lesions or rashes.  Psych: Good eyand depressed appearing.    Impression & Recommendations:  Problem # 1:  SPECIAL SCREENING FOR OSTEOPOROSIS (ICD-V82.81) Assessment Comment Only  Orders: T-Vitamin D (25-Hydroxy) ([id number])  Problem # 2:  FATIGUE (ICD-780.79) Assessment: Comment Only  Orders: T-Basic Metabolic Panel [id number]) T-CBC w/Diff [phone]) T-TSH [phone])  Problem # 3:  WEIGHT LOSS (ICD-783.21) Assessment: Comment Only  Orders: Gastroenterology Referral (GI)  Problem # 4:  DEPRESSION (ICD-311) Assessment: Deteriorated  Her updated medication list for this problem includes:    Diazepam 5 Mg Tabs (Diazepam) .Marland Kitchen... Take 1 tablet by mouth two times a day as needed    Sertraline Hcl 25 Mg Tabs (Sertraline hcl) .Marland Kitchen... Take 1 tablet by mouth once a day  Problem # 5:  ANXIETY (ICD-300.00) Assessment: Deteriorated  Her updated medication list for this problem includes:    Diazepam 5 Mg Tabs (Diazepam) .Marland Kitchen... Take 1 tablet by mouth two times a day as needed    Sertraline Hcl 25 Mg Tabs (Sertraline hcl) .Marland Kitchen... Take 1 tablet by mouth once a day  Complete Medication List: 1)  Diazepam 5 Mg Tabs (Diazepam) .... Take 1 tablet by mouth two times a day as needed 2)  Travatan Z 0.004 % Soln (Travoprost) .Marland Kitchen.. 1 drop in each eye every evening 3)  Centrum Silver Tabs (Multiple vitamins-minerals) .... Take 1 tablet by mouth once a day 4)  Sertraline Hcl 25 Mg Tabs (Sertraline hcl) .... Take 1 tablet by mouth once a day 5)  Simvastatin 20 Mg Tabs  (Simvastatin) .... Take 1 tab by mouth at bedtime  Other Orders: T-Hepatic Function [phone]) Tdap => 69yrs IM (74259) Admin 1st Vaccine (56387) Admin 1st Vaccine [facility]) [phone])  Patient Instructions: 1)  Please schedule a follow-up appointment in 2 months. 2)  I will start you  on a new med  for depression and anxiety, pls taper off of the valium as you are able and we discussed GRADUALLY.  3)  BMP prior to visit, ICD-9: 4)  CBC w/ Diff prior to visit, ICD-9:  tod 5)  TSh, hepatic panel 6)  You will start med for cholesterol once we assure you that your liver is fine Prescriptions: SIMVASTATIN 20 MG TABS (SIMVASTATIN) Take 1 tab by mouth at bedtime  #30 x 4   Entered and Authorized by:   Syliva Overman MD   Signed by:   Syliva Overman MD on 08/16/2009   Method used:   Electronically to        [facility] (retail)       [address] [place], [place]  [address]       Ph: [phone]  Fax: [phone]   RxID:   [id number] SERTRALINE HCL 25 MG TABS (SERTRALINE HCL) Take 1 tablet by mouth once a day  #30 x 3   Entered and Authorized by:   Syliva Overman MD   Signed by:   Syliva Overman MD on 08/16/2009   Method used:   Electronically to        [facility] (retail)       [address]       [place], [place]  [address]       Ph: [phone]       Fax: [phone]   RxID:   [id number]    Orders Added: 1)  New Patient Level IV [99204] 2)  T-Basic Metabolic Panel [phone] 3)  T-Hepatic Function [80076-22960] 4)  T-CBC w/Diff [[id number]] 5)  T-TSH [76160-73710] 6)  T-Vitamin D (25-Hydroxy) [62694-85462] 7)  Gastroenterology Referral [GI] 8)  Tdap => 22yrs IM [90715] 9)  Admin 1st Vaccine [90471] 10)  Admin 1st Vaccine [facility]) [70350K]    Tetanus/Td Vaccine    Vaccine Type: Tdap    Site: left deltoid    Mfr: Sanofi Pasteur    Dose: 0.5 ml    Route: IM     Given by: Everitt Amber LPN    Exp. Date: 09/20/2011    Lot #: [id number]

## 2010-08-01 NOTE — Assessment & Plan Note (Signed)
Summary: office visit   Vital Signs:  Patient profile:   60 year old female Menstrual status:  hysterectomy Height:      67 inches Weight:      127.75 pounds BMI:     20.08 O2 Sat:      98 % on Room air Pulse rate:   77 / minute Resp:     16 per minute BP sitting:   102 / 70  (left arm)  Vitals Entered By: Mauricia Area CMA (April 17, 2010 2:12 PM) CC: Headache, earache, sore throat, nose stuffy for about a week. Cold sores appeared on lips last night   Referring Provider:  Lodema Hong Primary Provider:  Lodema Hong  CC:  Headache, earache, sore throat, and nose stuffy for about a week. Cold sores appeared on lips last night.  History of Present Illness: Pt reports that she has been sick for about 1 1/2 weeks.  Started with sore throat for a few days, and seemed to be improving but then developed nasal congestion and cough.  Nonprod cough.  Clear nasal mucus. + sneezing.  Remembers having same sxs last yr at this time. No fever or chills.  Today developed fever blisters on lip.  Taking an over the counter decongestant the last few days and nasal congestion is doing much better.    Current Medications (verified): 1)  Travatan Z 0.004 % Soln (Travoprost) .Marland Kitchen.. 1 Drop in Each Eye Every Evening 2)  Centrum Silver  Tabs (Multiple Vitamins-Minerals) .... Take 1 Tablet By Mouth Once A Day 3)  Derma-Smoothe/fs Body 0.01 % Oil (Fluocinolone Acetonide) .... Apply To Scalp After Shampoo 4)  Hydroxyzine Hcl 10 Mg Tabs (Hydroxyzine Hcl) .Marland Kitchen.. 1-3 Tabs At Bedtime 5)  Zocor 20 Mg Tabs (Simvastatin) .... Take 1 Tab By Mouth At Bedtime 6)  Buspirone Hcl 5 Mg Tabs (Buspirone Hcl) .... Take 1 Tablet By Mouth Two Times A Day 7)  Oscal 500/200 D-3 500-200 Mg-Unit Tabs (Calcium-Vitamin D) .... Take 1 Tablet By Mouth Three Times A Day 8)  Ativan 0.5 Mg Tabs (Lorazepam) .... Half To One Tablet To Be Taken 30 Minutes Before Anticipated Panic Situation 9)  Amitiza 24 Mcg Caps (Lubiprostone) .Marland Kitchen.. 1 Cap Twice  Daily  Allergies (verified): No Known Drug Allergies  Past History:  Past medical history reviewed for relevance to current acute and chronic problems.  Past Medical History: Reviewed history from 03/13/2010 and no changes required. Anxiety-1980's Glaucoma since 2004  (D.>Whittaker), laser to left  eye  Sept 2011 WEIGHT LOSS (ICD-783.21) NAUSEA, CHRONIC (ICD-787.02) HEMATOCHEZIA (ICD-578.1) Chronic constipation DEPRESSION (ICD-311) negative celiac AB  Last colonoscopy Dr Jena Gauss 08/2005->hemorrhoids h pylori gastritis, last EGD 04/2004  Review of Systems General:  Denies chills and fever. ENT:  Complains of nasal congestion and postnasal drainage; denies earache, sinus pressure, and sore throat; STATES EARS AND THROAT FEEL ITCHY. CV:  Denies chest pain or discomfort and palpitations. Resp:  Complains of cough; denies shortness of breath, sputum productive, and wheezing. Allergy:  Complains of sneezing.  Physical Exam  General:  Well-developed,well-nourished,in no acute distress; alert,appropriate and cooperative throughout examination Head:  Normocephalic and atraumatic without obvious abnormalities. No apparent alopecia or balding. Ears:  External ear exam shows no significant lesions or deformities.  Otoscopic examination reveals clear canals, tympanic membranes are intact bilaterally without bulging, retraction, inflammation or discharge. Hearing is grossly normal bilaterally. Nose:  no external deformity.  mild mucosal swelling, pale, with clear mucus.  sinuses nontender to percussion Mouth:  Oral mucosa  and oropharynx without lesions or exudates.  Teeth in good repair.  Upper lip along vermillion border multiple tiny vesicles. Neck:  No deformities, masses, or tenderness noted. Lungs:  Normal respiratory effort, chest expands symmetrically. Lungs are clear to auscultation, no crackles or wheezes. Heart:  Normal rate and regular rhythm. S1 and S2 normal without gallop, murmur,  click, rub or other extra sounds. Cervical Nodes:  No lymphadenopathy noted Psych:  Cognition and judgment appear intact. Alert and cooperative with normal attention span and concentration. No apparent delusions, illusions, hallucinations   Impression & Recommendations:  Problem # 1:  ALLERGIC RHINITIS (ICD-477.9) Assessment New  Her updated medication list for this problem includes:    Fexofenadine Hcl 180 Mg Tabs (Fexofenadine hcl) .Marland Kitchen... Take one daily as needed for allergies  Problem # 2:  HERPES LABIALIS (ICD-054.9) Assessment: New  Complete Medication List: 1)  Travatan Z 0.004 % Soln (Travoprost) .Marland Kitchen.. 1 drop in each eye every evening 2)  Centrum Silver Tabs (Multiple vitamins-minerals) .... Take 1 tablet by mouth once a day 3)  Derma-smoothe/fs Body 0.01 % Oil (Fluocinolone acetonide) .... Apply to scalp after shampoo 4)  Hydroxyzine Hcl 10 Mg Tabs (Hydroxyzine hcl) .Marland Kitchen.. 1-3 tabs at bedtime 5)  Zocor 20 Mg Tabs (Simvastatin) .... Take 1 tab by mouth at bedtime 6)  Buspirone Hcl 5 Mg Tabs (Buspirone hcl) .... Take 1 tablet by mouth two times a day 7)  Oscal 500/200 D-3 500-200 Mg-unit Tabs (Calcium-vitamin d) .... Take 1 tablet by mouth three times a day 8)  Ativan 0.5 Mg Tabs (Lorazepam) .... Half to one tablet to be taken 30 minutes before anticipated panic situation 9)  Amitiza 24 Mcg Caps (Lubiprostone) .Marland Kitchen.. 1 cap twice daily 10)  Fexofenadine Hcl 180 Mg Tabs (Fexofenadine hcl) .... Take one daily as needed for allergies 11)  Valacyclovir Hcl 1 Gm Tabs (Valacyclovir hcl) .... Take 2 tabs now and again in 12 hrs  Patient Instructions: 1)  Follow up as planned. 2)  I have prescribed an allergy medication for you and a medication for your cold sores. 3)  You may continue to take your over the counter decongestant and other medications. Prescriptions: VALACYCLOVIR HCL 1 GM TABS (VALACYCLOVIR HCL) take 2 tabs now and again in 12 hrs  #4 x 0   Entered and Authorized by:   Esperanza Sheets PA   Signed by:   Esperanza Sheets PA on 04/17/2010   Method used:   Electronically to        [facility] (retail)       [address]       [place], [place]  [address]       Ph: [phone]       Fax: [phone]   RxID:   [id number] FEXOFENADINE HCL 180 MG TABS (FEXOFENADINE HCL) take one daily as needed for allergies  #30 x 1   Entered and Authorized by:   Esperanza Sheets PA   Signed by:   Esperanza Sheets PA on 04/17/2010   Method used:   Electronically to        [facility] (retail)       [address]       [place], [place]  [address]       Ph: [phone]       Fax: [phone]   RxID:   [id number]    Orders  Added: 1)  Est. Patient Level III [16109]

## 2010-08-01 NOTE — Assessment & Plan Note (Signed)
Summary: office visit   Vital Signs:  Patient profile:   61 year old female Menstrual status:  hysterectomy Height:      67 inches Weight:      131 pounds BMI:     20.59 O2 Sat:      97 % on Room air Pulse rate:   88 / minute Resp:     16 per minute BP sitting:   130 / 80  (left arm)  Vitals Entered By: Adella Hare LPN (June 17, 2010 1:56 PM)  O2 Flow:  Room air CC: follow-up visit Is Patient Diabetic? No   Primary Care Kelisha Dall:  Lodema Hong  CC:  follow-up visit.  History of Present Illness: Reports  that she has been doing fairly well.she has noted improvement ion her generalised anxiety and depression with counselling and has gaioned weight. she is voluntarily off any antidepressant, adn uses shoert acting anxiolytics as needed Denies recent fever or chills. Denies sinus pressure, nasal congestion ,  or sore throat. Denies chest congestion, or cough productive of sputum. Denies chest pain, palpitations, PND, orthopnea or leg swelling. Denies abdominal pain, nausea, vomitting, diarrhea and reports  improved  constipation. Denies change in bowel movements or bloody stool. Denies dysuria , frequency, incontinence or hesitancy.Has problems with hot flashes Denies  joint pain, swelling, or reduced mobility. Denies headaches, vertigo, seizures.  Denies  rash, lesions, or itch.     Current Medications (verified): 1)  Travatan Z 0.004 % Soln (Travoprost) .Marland Kitchen.. 1 Drop in Each Eye Every Evening 2)  Centrum Silver  Tabs (Multiple Vitamins-Minerals) .... Take 1 Tablet By Mouth Once A Day 3)  Hydroxyzine Hcl 10 Mg Tabs (Hydroxyzine Hcl) .Marland Kitchen.. 1-3 Tabs At Bedtime or Every 4 -6 Hours As Needed 4)  Zocor 20 Mg Tabs (Simvastatin) .... Take 1 Tab By Mouth At Bedtime 5)  Oscal 500/200 D-3 500-200 Mg-Unit Tabs (Calcium-Vitamin D) .... Take 1 Tablet By Mouth Three Times A Day 6)  Diazepam 5 Mg Tabs (Diazepam) .... One Tab By Mouth Two Times A Day As Needed For Anxiety  Allergies  (verified): No Known Drug Allergies  Review of Systems      See HPI General:  Complains of fatigue. Eyes:  Denies discharge and red eye. ENT:  Complains of earache; RIGHT EARACHE INTERMITTETLY, ALSO REPORTS SHE HAS NOTED GUM PAIN SINCE LAST NIGHt. GI:  Complains of constipation; using only metAMUCIL WITH SUCCESS. GU:  hot flashes persist. Psych:  Complains of anxiety and panic attacks; denies depression, suicidal thoughts/plans, thoughts of violence, and unusual visions or sounds; less frequent panic attacks, utilising behaviora modification l techniques. Endo:  Denies cold intolerance, excessive hunger, excessive thirst, excessive urination, and heat intolerance. Heme:  Denies abnormal bruising, bleeding, enlarge lymph nodes, and fevers. Allergy:  Complains of seasonal allergies; denies hives or rash and itching eyes.  Physical Exam  General:  Well-developed,well-nourished,in no acute distress; alert,appropriate and cooperative throughout examination HEENT: No facial asymmetry,  EOMI, No sinus tenderness, right tM erythematous and dul,left TM clear, oropharynx  pink and moist.   Chest: Clear to auscultation bilaterally.  CVS: S1, S2, No murmurs, No S3.   Abd: Soft, Nontender.  MS: Adequate ROM spine, hips, shoulders and knees.  Ext: No edema.   CNS: CN 2-12 intact, power tone and sensation normal throughout.   Skin: Intact, no visible lesions or rashes.  Psych: Good eye contact, normal affect.  Memory intact, mildly anxious not depressed appearing.    Impression & Recommendations:  Problem #  1:  ROM (ICD-382.9) Assessment Comment Only  Her updated medication list for this problem includes:    Cephalexin 250 Mg Caps (Cephalexin) .Marland Kitchen... Take 1 capsule by mouth two times a day  Problem # 2:  ALLERGIC RHINITIS (ICD-477.9) Assessment: Unchanged  The following medications were removed from the medication list:    Fexofenadine Hcl 180 Mg Tabs (Fexofenadine hcl) .Marland Kitchen... Take one  daily as needed for allergies  Problem # 3:  DERMATOMYCOSIS (ICD-111.9) Assessment: Unchanged  Her updated medication list for this problem includes:    Fluconazole 150 Mg Tabs (Fluconazole) .Marland Kitchen... Take 1 tablet by mouth once a day as needed for vaginal itch  Problem # 4:  HYPERLIPIDEMIA (ICD-272.4) Assessment: Deteriorated  Her updated medication list for this problem includes:    Zocor 20 Mg Tabs (Simvastatin) .Marland Kitchen... Take 1 tab by mouth at bedtime  Orders: T-Lipid Profile ([id number]) T-Hepatic Function [phone]) Low fat dietdiscussed and encouraged  Labs Reviewed: SGOT: 30 (05/21/2010)   SGPT: 21 (05/21/2010)   HDL:75 (05/21/2010), 70 (12/27/2009)  LDL:113 (05/21/2010), 80 (62/95/2841)  Chol:202 (05/21/2010), 164 (12/27/2009)  Trig:71 (05/21/2010), 69 (12/27/2009)  Complete Medication List: 1)  Travatan Z 0.004 % Soln (Travoprost) .Marland Kitchen.. 1 drop in each eye every evening 2)  Centrum Silver Tabs (Multiple vitamins-minerals) .... Take 1 tablet by mouth once a day 3)  Hydroxyzine Hcl 10 Mg Tabs (Hydroxyzine hcl) .Marland Kitchen.. 1-3 tabs at bedtime or every 4 -6 hours as needed 4)  Zocor 20 Mg Tabs (Simvastatin) .... Take 1 tab by mouth at bedtime 5)  Oscal 500/200 D-3 500-200 Mg-unit Tabs (Calcium-vitamin d) .... Take 1 tablet by mouth three times a day 6)  Diazepam 5 Mg Tabs (Diazepam) .... One half  tablet once daily asneeded for anxiety 7)  Cephalexin 250 Mg Caps (Cephalexin) .... Take 1 capsule by mouth two times a day 8)  Fluconazole 150 Mg Tabs (Fluconazole) .... Take 1 tablet by mouth once a day as needed for vaginal itch  Other Orders: T-Basic Metabolic Panel [phone]) T-CBC w/Diff [phone]) T-TSH [phone])  Patient Instructions: 1)  Please schedule a follow-up appointment in 4 months. 2)  BMP prior to visit, ICD-9: 3)  Hepatic Panel prior to visit, ICD-9: 4)  Lipid Panel prior to visit, ICD-9: 5)  TSH prior to visit, ICD-9: 6)  CBC w/ Diff prior to visit,  ICD-9: 7)  Med are sent in for right ear infection. 8)  Pls keep appt with dentist in January. 9)  You will receive info. on hot flashes 10)  pLS resume simvastatin one every night Prescriptions: FLUCONAZOLE 150 MG TABS (FLUCONAZOLE) Take 1 tablet by mouth once a day as needed for vaginal itch  #2 x 0   Entered and Authorized by:   Syliva Overman MD   Signed by:   Syliva Overman MD on 06/17/2010   Method used:   Electronically to        [facility] (retail)       [address]       [place], [place]  [address]       Ph: [phone]       Fax: [phone]   RxID:   [id number] CEPHALEXIN 250 MG CAPS (CEPHALEXIN) Take 1 capsule by mouth two times a day  #20 x 0   Entered and Authorized by:   Syliva Overman MD   Signed by:   Syliva Overman MD on 06/17/2010   Method used:  Electronically to        [facility] (retail)       [address]       [place], [place]  [address]       Ph: [phone]       Fax: [phone]   RxID:   [id number] DIAZEPAM 5 MG TABS (DIAZEPAM) one half  tablet once daily asneeded for anxiety  #15 x 5   Entered and Authorized by:   Syliva Overman MD   Signed by:   Syliva Overman MD on 06/17/2010   Method used:   Printed then faxed to ...       [facility] (retail)       [address]       [place], [place]  [address]       Ph: [phone]       Fax: [phone]   RxID:   [id number]    Orders Added: 1)  Est. Patient Level IV [01093] 2)  T-Basic Metabolic Panel [phone] 3)  T-Lipid Profile [80061-22930] 4)  T-Hepatic Function [80076-22960] 5)  T-CBC w/Diff [[id number]] 6)  T-TSH [28315-17616]

## 2010-08-01 NOTE — Assessment & Plan Note (Signed)
Summary: WT LOSS,GU   Visit Type:  Initial Consult Referring Provider:  Lodema Hong Primary Care Provider:  Lodema Hong  Chief Complaint:  Wt. loss.  History of Present Illness: 60 y/o black female here for evaluation of wt loss.  Changed to Dr Lodema Hong for PCP.  Has had 8# wt loss in 1 yr and constipation.  Pt has long-standing hx intermittant constipation and previously evaluated here for nausea and weight loss in 2006-2007.  c/o constipation started 3 months.  Tried stool softener two times a day or enemas.  c/o incomplete evacuation.  Lots of straining.  Saw bright red blood 2 months ago one time in large amt, felt like a hemorrhoid.  Tried suppositories which helped w/ bleeding.  Occ nausea, no vomiting. c/o "hot flashes."  Denies heartburn or indigestion.  Denies dysphagia, or odynophagia.  c/o anorexia w/ constipation eating 1-2 good meals per day.  "Feel like I have to have BM every day."    Current Problems (verified): 1)  Constipation, Chronic  (ICD-564.09) 2)  Weight Loss  (ICD-783.21) 3)  Nausea, Chronic  (ICD-787.02) 4)  Fatigue  (ICD-780.79) 5)  Hematochezia  (ICD-578.1) 6)  Depression  (ICD-311) 7)  Anxiety  (ICD-300.00)  Current Medications (verified): 1)  Diazepam 5 Mg Tabs (Diazepam) .... Take 1 Tablet By Mouth Two Times A Day As Needed 2)  Travatan Z 0.004 % Soln (Travoprost) .Marland Kitchen.. 1 Drop in Each Eye Every Evening 3)  Centrum Silver  Tabs (Multiple Vitamins-Minerals) .... Take 1 Tablet By Mouth Once A Day  Allergies (verified): No Known Drug Allergies  Past History:  Past Medical History: Anxiety-1980's Glaucoma since 2004  (D.>Whittaker) WEIGHT LOSS (ICD-783.21) NAUSEA, CHRONIC (ICD-787.02) HEMATOCHEZIA (ICD-578.1) Chronic constipation DEPRESSION (ICD-311) negative celiac AB  Last colonoscopy Dr Jena Gauss 08/2005->hemorrhoids h pylori gastritis, last EGD 04/2004  Family History: Mother-Living-HTN, glacoma, diverticulosis Father-Deceased-Lung Cancer 3 sisters-1  sister, mentally challenged-2 healthy 1 brother-Healthy, GERD, colon polyps 1 son-colon polyps  Review of Systems General:  Complains of fatigue and weight loss; denies fever, chills, sweats, anorexia, weakness, malaise, and sleep disorder. CV:  Denies chest pains, angina, palpitations, syncope, dyspnea on exertion, orthopnea, PND, peripheral edema, and claudication. Resp:  Denies dyspnea at rest, dyspnea with exercise, cough, sputum, wheezing, coughing up blood, and pleurisy. GI:  Denies difficulty swallowing, pain on swallowing, indigestion/heartburn, vomiting, vomiting blood, abdominal pain, jaundice, diarrhea, bloody BM's, black BMs, and fecal incontinence. GU:  Denies urinary burning, blood in urine, nocturnal urination, urinary frequency, and urinary incontinence. Derm:  Denies rash, itching, dry skin, hives, moles, warts, and unhealing ulcers. Psych:  Denies depression, anxiety, memory loss, suicidal ideation, hallucinations, paranoia, phobia, and confusion. Heme:  Denies bruising, bleeding, and enlarged lymph nodes.  Vital Signs:  Patient profile:   60 year old female Menstrual status:  hysterectomy Height:      67 inches Weight:      120 pounds BMI:     18.86 Temp:     98.0 degrees F oral Pulse rate:   72 / minute BP sitting:   110 / 78  (left arm) Cuff size:   regular  Vitals Entered By: Cloria Spring LPN (September 24, 2009 3:01 PM)  Physical Exam  General:  Well developed, well nourished, no acute distress. Head:  Normocephalic and atraumatic. Eyes:  Sclera clear, no icterus. Ears:  Normal auditory acuity. Nose:  No deformity, discharge,  or lesions. Mouth:  No deformity or lesions, dentition normal. Neck:  Supple; no masses or thyromegaly. Lungs:  Clear throughout  to auscultation. Heart:  Regular rate and rhythm; no murmurs, rubs,  or bruits. Abdomen:  Soft, nontender and nondistended. No masses, hepatosplenomegaly or hernias noted. Normal bowel sounds.without guarding  and without rebound.   Msk:  Symmetrical with no gross deformities. Normal posture. Pulses:  Normal pulses noted. Extremities:  No clubbing, cyanosis, edema or deformities noted. Neurologic:  Alert and  oriented x4;  grossly normal neurologically. Skin:  Intact without significant lesions or rashes. Cervical Nodes:  No significant cervical adenopathy. Psych:  Alert and cooperative. Normal mood and affect.  Impression & Recommendations:  Problem # 1:  WEIGHT LOSS (ICD-783.21) 60 y/o black female w/ 8# wt loss in past year.  She has hx chronic constipation, hx h pylori gastritis, and evaluation of weight loss in 2006-2007 without significant findings in GI tract.  She has actually gained 9.5# since last office visit 4 yrs ago.  That being said, she has had recent hematachezia which is concerning and warrants further work-up to be sure this is benign ano-rectal source such as hemorrhoids with straining,given her last colonoscopy was 4 yrs ago.   If nothing is found to explain weight loss, would consider EGD to r/o PUD or refractory h pylori.    Diagnostic colonoscopy +/- EGD to be performed by Dr. Suszanne Conners Rourk in the near future.  I have discussed risks and benefits which include, but are not limited to, bleeding, infection, perforation, or medication reaction.  The patient agrees with this plan and consent will be obtained.  Orders: Consultation Level III (40981)  Problem # 2:  HEMATOCHEZIA (ICD-578.1) See #1  Problem # 3:  CONSTIPATION, CHRONIC (ICD-564.09) See #1  Problem # 4:  NAUSEA, CHRONIC (ICD-787.02) See #1  Patient Instructions: 1)  Begin high fiber diet (handout given) 2)  Amitiza up to two times a day w/ food as needed for constipation.

## 2010-08-01 NOTE — Letter (Signed)
Summary: Patient Notice, Colon Biopsy Results  [facility]  [address]   [place], [place] [address]   Phone: [phone]  Fax: [phone]       October 25, 2009   Tamara Lam [address] [place], [place]  [address] 12-27-1950    Dear [name],  I am pleased to inform you that the biopsies taken during your recent colonoscopy did not show any evidence of cancer upon pathologic examination.  Additional information/recommendations:  Continue with the treatment plan as outlined on the day of your exam.  You should have a repeat colonoscopy examination  in 5 years.  Please call us if you are having persistent problems or have questions about your condition that have not been fully answered at this time.  Sincerely,    R. Roetta Sessions MD, FACP [facility] Ph: [phone]    Fax: [phone]   Appended Document: Patient Notice, Colon Biopsy Results mailed letter to pt  Appended Document: Patient Notice, Colon Biopsy Results reminder in computer

## 2010-08-02 ENCOUNTER — Other Ambulatory Visit: Payer: Self-pay | Admitting: Family Medicine

## 2010-08-02 DIAGNOSIS — Z1231 Encounter for screening mammogram for malignant neoplasm of breast: Secondary | ICD-10-CM

## 2010-08-07 NOTE — Assessment & Plan Note (Signed)
Summary: office visit   Vital Signs:  Patient profile:   60 year old female Menstrual status:  hysterectomy Height:      67 inches Weight:      130.75 pounds BMI:     20.55 O2 Sat:      97 % Temp:     99.7 degrees F Pulse rate:   91 / minute Resp:     16 per minute BP sitting:   120 / 84  (left arm) Cuff size:   regular  Vitals Entered By: Everitt Amber LPN (July 19, 2010 8:45 AM) CC: c/o very bad sore throat and eyes have been very red, itchy and burning. The right one has been swollen on the top lid. Fever, chills and just feeling bad   Primary Care Provider:  Lodema Hong  CC:  c/o very bad sore throat and eyes have been very red, itchy and burning. The right one has been swollen on the top lid. Fever, and chills and just feeling bad.  History of Present Illness: 5 day h/o generalised aches, chills, itchy eyes with redness and sticky drainage at times, sore throat which is worsening, right ear pain, and itch which is not new, she states her symptoms are worse  Current Medications (verified): 1)  Travatan Z 0.004 % Soln (Travoprost) .Marland Kitchen.. 1 Drop in Each Eye Every Evening 2)  Centrum Silver  Tabs (Multiple Vitamins-Minerals) .... Take 1 Tablet By Mouth Once A Day 3)  Hydroxyzine Hcl 10 Mg Tabs (Hydroxyzine Hcl) .Marland Kitchen.. 1-3 Tabs At Bedtime or Every 4 -6 Hours As Needed 4)  Zocor 20 Mg Tabs (Simvastatin) .... Take 1 Tab By Mouth At Bedtime 5)  Oscal 500/200 D-3 500-200 Mg-Unit Tabs (Calcium-Vitamin D) .... Take 1 Tablet By Mouth Three Times A Day 6)  Diazepam 5 Mg Tabs (Diazepam) .... One Half  Tablet Once Daily Asneeded For Anxiety  Allergies (verified): No Known Drug Allergies  Review of Systems      See HPI General:  Complains of chills, fatigue, malaise, and weakness. Eyes:  Denies discharge and red eye. ENT:  Complains of earache, postnasal drainage, and sore throat. CV:  Denies chest pain or discomfort, palpitations, shortness of breath with exertion, and swelling of  feet. Resp:  Denies cough and sputum productive. GI:  Denies abdominal pain, constipation, diarrhea, nausea, and vomiting. GU:  Denies dysuria and urinary frequency. MS:  Complains of muscle aches and muscle weakness. Derm:  Complains of itching and lesion(s). Psych:  Complains of anxiety; denies depression. Endo:  Denies excessive thirst, excessive urination, and heat intolerance. Heme:  Denies abnormal bruising, bleeding, enlarge lymph nodes, and fevers. Allergy:  Denies hives or rash, itching eyes, and seasonal allergies.  Physical Exam  General:  Well-developed,well-nourished,in no acute distress; alert,appropriate and cooperative throughout examination HEENT: No facial asymmetry,  EOMI, No sinus tenderness, TM's Clear, oropharynx  erythematous and moist.No exudate notedBilateral cervical adenitis   Chest: Clear to auscultation bilaterally.  CVS: S1, S2, No murmurs, No S3.   Abd: Soft, Nontender.  MS: Adequate ROM spine, hips, shoulders and knees.  Ext: No edema.   CNS: CN 2-12 intact, power tone and sensation normal throughout.   Skin: Intact, no visible lesions or rashes.  Psych: Good eye contact, normal affect.  Memory intact,mildly anxious not  depressed appearing.    Impression & Recommendations:  Problem # 1:  DERMATITIS (ICD-692.9) Assessment Unchanged  Future Orders: Dermatology Referral (Derma) ... 07/26/2010, pt seeking opinion of a specific derm re purtus  of sclp , has seen at least 3 in the past. She is aware that she may hear nothing new but wants the eval.  Problem # 2:  EAR PAIN, RIGHT (ICD-388.70) Assessment: Unchanged  The following medications were removed from the medication list:    Cephalexin 250 Mg Caps (Cephalexin) .Marland Kitchen... Take 1 capsule by mouth two times a day Her updated medication list for this problem includes:    Penicillin V Potassium 500 Mg Tabs (Penicillin v potassium) .Marland Kitchen... Take 1 tablet by mouth three times a day  Future Orders: ENT  Referral (ENT) ... 07/22/2010  Problem # 3:  ANXIETY (ICD-300.00) Assessment: Improved  Her updated medication list for this problem includes:    Hydroxyzine Hcl 10 Mg Tabs (Hydroxyzine hcl) .Marland Kitchen... 1-3 tabs at bedtime or every 4 -6 hours as needed    Diazepam 5 Mg Tabs (Diazepam) ..... One half  tablet once daily asneeded for anxiety psycho therapy has been espescially helpful  Problem # 4:  ACUTE PHARYNGITIS (ICD-462) Assessment: Comment Only  The following medications were removed from the medication list:    Cephalexin 250 Mg Caps (Cephalexin) .Marland Kitchen... Take 1 capsule by mouth two times a day Her updated medication list for this problem includes:    Penicillin V Potassium 500 Mg Tabs (Penicillin v potassium) .Marland Kitchen... Take 1 tablet by mouth three times a day    First-dukes Mouthwash Susp (Diphenhyd-hydrocort-nystatin) .Marland KitchenMarland KitchenMarland KitchenMarland Kitchen 10cc (two teaspoons) every 8 hours for 5 days , then as needed for painful swallowing  Orders: Rapid Strep (29562)  Instructed to complete antibiotics and call if not improved in 48 hours.   Complete Medication List: 1)  Travatan Z 0.004 % Soln (Travoprost) .Marland Kitchen.. 1 drop in each eye every evening 2)  Centrum Silver Tabs (Multiple vitamins-minerals) .... Take 1 tablet by mouth once a day 3)  Hydroxyzine Hcl 10 Mg Tabs (Hydroxyzine hcl) .Marland Kitchen.. 1-3 tabs at bedtime or every 4 -6 hours as needed 4)  Zocor 20 Mg Tabs (Simvastatin) .... Take 1 tab by mouth at bedtime 5)  Oscal 500/200 D-3 500-200 Mg-unit Tabs (Calcium-vitamin d) .... Take 1 tablet by mouth three times a day 6)  Diazepam 5 Mg Tabs (Diazepam) .... One half  tablet once daily asneeded for anxiety 7)  Penicillin V Potassium 500 Mg Tabs (Penicillin v potassium) .... Take 1 tablet by mouth three times a day 8)  First-dukes Mouthwash Susp (Diphenhyd-hydrocort-nystatin) .Marland Kitchen.. 10cc (two teaspoons) every 8 hours for 5 days , then as needed for painful swallowing  Patient Instructions: 1)  f/u as before. 2)  You are being  treated for acute pharyngitis, meds are sent in for this.You also have upper respiratory infection. 3)  You are being referred to eNT and dermatology 4)  Get plenty of rest, drink lots of clear liquids, and use Tylenol or Ibuprofen for fever and comfort. Return in 7-10 days if you're not better:sooner if you're feeling worse. 5)  Take 650-1000mg  of Tylenol every 4-6 hours as needed for relief of pain or comfort of fever AVOID taking more than 4000mg   in a 24 hour period (can cause liver damage in higher doses). 6)  Oral Rehydration Solution: drink 1/2 ounce every 15 minutes. If tolerated afert 1 hour, drink 1 ounce every 15 minutes. As you can tolerate, keep adding 1/2 ounce every 15 minutes, up to a total of 2-4 ounces. Contact the office if unable to tolerate oral solution, if you keep vomiting, or you continue to have signs of dehydration.  7)  Take your antibiotic as prescribed until ALL of it is gone, but stop if you develop a rash or swelling and contact our office as soon as possible. Prescriptions: FIRST-DUKES MOUTHWASH  SUSP (DIPHENHYD-HYDROCORT-NYSTATIN) 10cc (two teaspoons) every 8 hours for 5 days , then as needed for painful swallowing  #338ml x 0   Entered and Authorized by:   Syliva Overman MD   Signed by:   Syliva Overman MD on 07/19/2010   Method used:   Electronically to        [facility] (retail)       [address]       [place], [place]  [address]       Ph: [phone]       Fax: [phone]   RxID:   [id number] PENICILLIN V POTASSIUM 500 MG TABS (PENICILLIN V POTASSIUM) Take 1 tablet by mouth three times a day  #30 x 0   Entered and Authorized by:   Syliva Overman MD   Signed by:   Syliva Overman MD on 07/19/2010   Method used:   Electronically to        [facility] (retail)       [address]       [place], [place]  [address]       Ph: [phone]       Fax: [phone]   RxID:    [id number]    Orders Added: 1)  Est. Patient Level IV [88416] 2)  Rapid Strep [60630] 3)  ENT Referral [ENT] 4)  Dermatology Referral [Derma]    Laboratory Results    Other Tests  Rapid Strep: negative

## 2010-08-07 NOTE — Letter (Signed)
Summary: ent  ent   Imported By: Lind Guest 07/30/2010 13:13:05  _____________________________________________________________________  External Attachment:    Type:   Image     Comment:   External Document

## 2010-08-26 ENCOUNTER — Ambulatory Visit
Admission: RE | Admit: 2010-08-26 | Discharge: 2010-08-26 | Disposition: A | Discharge status: Home / Self Care | Payer: 59 | Source: Ambulatory Visit | Attending: Family Medicine | Admitting: Family Medicine

## 2010-08-26 DIAGNOSIS — Z1231 Encounter for screening mammogram for malignant neoplasm of breast: Secondary | ICD-10-CM

## 2010-08-29 ENCOUNTER — Encounter: Payer: Self-pay | Admitting: Family Medicine

## 2010-09-05 NOTE — Letter (Signed)
Summary: ent  ent   Imported By: Lind Guest 08/30/2010 14:48:29  _____________________________________________________________________  External Attachment:    Type:   Image     Comment:   External Document

## 2010-10-16 ENCOUNTER — Encounter: Payer: Self-pay | Admitting: Family Medicine

## 2010-10-17 ENCOUNTER — Ambulatory Visit (INDEPENDENT_AMBULATORY_CARE_PROVIDER_SITE_OTHER): Payer: 59 | Admitting: Family Medicine

## 2010-10-17 ENCOUNTER — Encounter: Payer: Self-pay | Admitting: Family Medicine

## 2010-10-17 VITALS — BP 110/80 | HR 80 | Resp 16 | Ht 66.5 in | Wt 127.1 lb

## 2010-10-17 DIAGNOSIS — F329 Major depressive disorder, single episode, unspecified: Secondary | ICD-10-CM

## 2010-10-17 DIAGNOSIS — R5383 Other fatigue: Secondary | ICD-10-CM

## 2010-10-17 DIAGNOSIS — E785 Hyperlipidemia, unspecified: Secondary | ICD-10-CM

## 2010-10-17 DIAGNOSIS — F32A Depression, unspecified: Secondary | ICD-10-CM

## 2010-10-17 DIAGNOSIS — R5381 Other malaise: Secondary | ICD-10-CM

## 2010-10-17 MED ORDER — VENLAFAXINE HCL ER 37.5 MG PO CP24: 37.5000 mg | ORAL_CAPSULE | Freq: Every day | ORAL | Status: DC

## 2010-10-17 MED ORDER — SIMVASTATIN 20 MG PO TABS: 20.0000 mg | ORAL_TABLET | Freq: Every day | ORAL | Status: DC

## 2010-10-17 NOTE — Patient Instructions (Signed)
CPE in 4 months. New med for hot flashes and to improve your energy. Your mammogram in Feb was normal.  Fasting labs are due.

## 2010-10-20 ENCOUNTER — Encounter: Payer: Self-pay | Admitting: Family Medicine

## 2010-10-20 NOTE — Progress Notes (Signed)
  Subjective:    Patient ID: Tamara Lam, female    DOB: [birth year], 60 y.o.   MRN: [id number]  HPI The PT is here for follow up and re-evaluation of chronic medical conditions, medication management and review of recent lab and radiology data.  Preventive health is updated, specifically  Cancer screening, Osteoporosis screening and Immunization.   Questions or concerns regarding consultations or procedures which the PT has had in the interim are  addressed. The PT denies any adverse reactions to current medications since the last visit.  Tamara Lam reports continued chronic fatigue, though she denies being actively depressed, she takes xanax for situational anxiety in particular, and has been in therapy many times in the past. She also has complaints about significant hot flashes, which do not seem to be lessening      Review of Systems SeeHPI Denies recent fever or chills. Denies sinus pressure, nasal congestion, ear pain or sore throat. Denies chest congestion, productive cough or wheezing. Denies chest pains, palpitations, paroxysmal nocturnal dyspnea, orthopnea and leg swelling Denies abdominal pain, nausea, vomiting,diarrhea or constipation.  Denies rectal bleeding or change in bowel movement. Denies dysuria, frequency, hesitancy or incontinence. Denies joint pain, swelling and limitation and mobility. Denies headaches, seizure, numbness, or tingling. Denies skin break down or rash.        Objective:   Physical Exam Patient alert and oriented and in no Cardiopulmonary distress.  HEENT: No facial asymmetry, EOMI, no sinus tenderness, TM's clear, Oropharynx pink and moist.  Neck supple no adenopathy.  Chest: Clear to auscultation bilaterally.  CVS: S1, S2 no murmurs, no S3.  ABD: Soft non tender. Bowel sounds normal.  Ext: No edema  Tamara: Adequate ROM spine, shoulders, hips and knees.  Skin: Intact, no ulcerations or rash noted.  Psych: Good eye contact, normal  affect. Memory intact , mildly anxious but not  depressed appearing.  CNS: CN 2-12 intact, power, tone and sensation normal throughout.        Assessment & Plan:  Fatigue with disabling hot flashes and underlying anxiety  With mild depression: will attempt to add venlafexine 2. Hyperlipidemia;controlled no med change Dematitis and seborreah: unchanged

## 2010-10-29 LAB — HEPATIC FUNCTION PANEL
AST: 22 U/L (ref 0–37)
Albumin: 4.5 g/dL (ref 3.5–5.2)
Alkaline Phosphatase: 52 U/L (ref 39–117)
Total Protein: 7.2 g/dL (ref 6.0–8.3)

## 2010-10-29 LAB — CBC WITH DIFFERENTIAL/PLATELET
Eosinophils Relative: 1 % (ref 0–5)
HCT: 41.7 % (ref 36.0–46.0)
Hemoglobin: 12.9 g/dL (ref 12.0–15.0)
Lymphocytes Relative: 38 % (ref 12–46)
MCHC: 30.9 g/dL (ref 30.0–36.0)
MCV: 90.3 fL (ref 78.0–100.0)
Monocytes Absolute: 0.6 10*3/uL (ref 0.1–1.0)
Monocytes Relative: 9 % (ref 3–12)
Neutro Abs: 3.6 10*3/uL (ref 1.7–7.7)
RDW: 14.4 % (ref 11.5–15.5)
WBC: 6.9 10*3/uL (ref 4.0–10.5)

## 2010-10-29 LAB — BASIC METABOLIC PANEL
CO2: 28 mEq/L (ref 19–32)
Calcium: 9.3 mg/dL (ref 8.4–10.5)
Glucose, Bld: 94 mg/dL (ref 70–99)
Potassium: 4.1 mEq/L (ref 3.5–5.3)
Sodium: 143 mEq/L (ref 135–145)

## 2010-10-29 LAB — LIPID PANEL
HDL: 55 mg/dL (ref >39)
LDL Cholesterol: 93 mg/dL (ref 0–99)
Triglycerides: 69 mg/dL (ref <150)
VLDL: 14 mg/dL (ref 0–40)

## 2010-10-29 LAB — TSH: TSH: 1.411 u[IU]/mL (ref 0.350–4.500)

## 2010-11-15 NOTE — Op Note (Signed)
NAME:  Tamara Lam, Tamara Lam NO.:  [id number]   MEDICAL RECORD NO.:  [id number]          PATIENT TYPE:  AMB   LOCATION:  DAY                           FACILITY:  [facility]   PHYSICIAN:  R. Roetta Sessions, M.D. DATE OF BIRTH:  10/21/1950   DATE OF PROCEDURE:  05/29/2004  DATE OF DISCHARGE:                                 OPERATIVE REPORT   PROCEDURE:  Esophagogastroduodenoscopy with biopsy.   INDICATION FOR PROCEDURE:  The patient is a 60 year old lady with recent  postprandial nausea and weight loss, recent history of easy bruisability for  which she saw Dr. Mariel Sleet.  CT of the abdomen demonstrated a markedly  thickened gastric mucosa concerning for infiltrating process.  She comes now  for EGD.  This approach has been discussed with the patient at length.  The  potential risks, benefits, and alternatives have been reviewed and questions  answered.  She is agreeable.  Please see the documentation in the medical  record for more information.   PROCEDURE NOTE:  O2 saturation, blood pressure, pulse, and respirations were  monitored throughout the entire procedure.   CONSCIOUS SEDATION:  Versed 4 mg IV, Demerol 75 mg IV in divided doses.   INSTRUMENT USED:  Olympus video chip system.   FINDINGS:  Examination of the tubular esophagus revealed no mucosal  abnormalities.  The EG junction was easily traversed.   Stomach:  The gastric cavity was empty.  It failed to insufflate  satisfactorily with air.  There was somewhat of an hourglass configuration  with noncompliance of the gastric cavity midbody to inflate fully.  There  were diffuse submucosal petechial hemorrhages.  Otherwise the mucosa itself  appeared normal.  There was no obvious infiltrating process or ulceration or  erosion seen.  A thorough examination of the gastric mucosa was carried out.  The pylorus was patent and easily traversed.  Examination of the bulb and  second portion revealed no abnormalities.   Therapeutic/diagnostic maneuvers performed:  At least 12 biopsies of the  gastric mucosa were taken for histologic study.  The patient tolerated the  procedure well, was reacted in endoscopy.   IMPRESSION:  1.  Normal esophagus.  2.  Diffuse submucosal petechial hemorrhage of the gastric mucosa, some      noncompliance of the gastric cavity, failure to insufflate fully,      suggestive of hourglass configuration concerning for an underlying      infiltrating process.  Multiple gastric biopsies taken.  3.  Normal D1, D2.   RECOMMENDATIONS:  1.  These findings are concerning given her clinical scenario and the      abnormal CT; however, biopsies may      ultimately not be diagnostic.  Further diagnostic studies may be needed.      Will follow up on pathology.  2.  Check Helicobacter pylori serologies.  3.  Further recommendations to follow.     Otelia Sergeant   RMR/MEDQ  D:  05/29/2004  T:  05/29/2004  Job:  [id number]   cc:   Ramon Dredge L. Juanetta Gosling, M.D.  [address]  [place]  [place] [address]  Fax: [phone]   Laverle Hobby, M.D.   Ladona Horns. Neijstrom, MD  [address]  [place]  [place] [address]  Fax: [phone]

## 2010-11-15 NOTE — Op Note (Signed)
[facility]  Patient:    Tamara Lam, Tamara Lam Visit Number: [id number] MRN: [id number]          Service Type: END Location: DAY Attending Physician:  Jonathon Bellows Dictated by:   Roetta Sessions, M.D. Admit Date:  09/14/2001 Discharge Date: 09/14/2001                             Operative Report  INDICATIONS FOR PROCEDURE:  The patient is a 60 year old lady referred at the courtesy of Dr. Kari Baars for colorectal cancer screening.  She is devoid of any GI symptoms and no family history of colorectal neoplasia.  No prior imaging of her lower GI tract.  Colonoscopy is now being done as a standard screening approach.  This approach has been discussed with Ms. Constance Haw at length at the bedside.  The potential risks, benefits, and alternatives have been reviewed and questions answered.  She is agreeable.  Please see my handwritten H&P for more information.  PROCEDURE NOTE:  O2 saturation, blood pressure, pulse, and respirations were monitored throughout the entirety of the procedure.  CONSCIOUS SEDATION:  Demerol 75 mg IV and Versed 3 mg IV in divided doses.  INSTRUMENT:  Olympus video chip colonoscope.  FINDINGS:  Digital rectal exam revealed no abnormalities.  ENDOSCOPIC FINDINGS:  Prep was good.  RECTUM:  Examination of the rectal mucosa including retroflexed view of the anal verge revealed a 5 mm sessile polyp at 7 cm from the anal verge.  COLON:  The colonic mucosa was surveyed from the rectosigmoid junction through the left transverse and right colon to the area of the appendiceal orifice, ileocecal valve, and cecum. These structures were well-seen and photographed. No colonic mucosal abnormalities were noted.  Upon advancement of the scope to the cecum, the cecum, ileocecal valve, and appendiceal orifice were photographed for the record. From this level, the scope was slowly withdrawn. All previously mentioned mucosal surfaces were again  seen.  No other abnormalities were observed.  The polyp in the rectum was removed with snare cautery and recovered through the scope.  The patient tolerated the procedure well.  IMPRESSION: 1. Polyp in the rectum, resected with snares as described above.     The remainder of the rectal mucosa appeared normal. 2. Normal colon.  RECOMMENDATIONS: 1. No arthritis medications or aspirin for 10 days. 2. Followup on pathology. 3. Further recommendations to follow. Dictated by:   Roetta Sessions, M.D. Attending Physician:  Jonathon Bellows DD:  09/14/01 TD:  09/16/01 Job: [id number]

## 2010-11-15 NOTE — Op Note (Signed)
NAME:  Tamara Lam, Tamara Lam NO.:  [id number]   MEDICAL RECORD NO.:  [id number]          PATIENT TYPE:  AMB   LOCATION:  DAY                           FACILITY:  [facility]   PHYSICIAN:  R. Roetta Sessions, M.D. DATE OF BIRTH:  29-Dec-1950   DATE OF PROCEDURE:  08/28/2005  DATE OF DISCHARGE:                                 OPERATIVE REPORT   PROCEDURE:  Diagnostic colonoscopy.   INDICATIONS FOR PROCEDURE:  The patient is a 60 year old lady with  intermittent nausea who was found to have three out of three returnable  Hemoccult cards being positive recently. She had a colonoscopy in 2003 and  had a benign polyp in the rectum which was removed. Clinically, she has not  had any GI bleeding. No melena or rectal bleeding. No hematemesis. She is  intermittently nauseated. She has had no abdominal pain. She gets nauseated  when she gets stressed out (she states that she when she had a mammogram a  week ago it revealed a cyst requiring further followup, and she became  nauseated). Otherwise, she has done well without GI symptoms most of the  time. She tells me she has gained weight since Christmas of 2006. She  recently had CT of the abdomen and pelvis which demonstrated no  abnormalities. Colonoscopy is now being done. This approach has been  discussed with the patient at length. Potential risks, benefits, and  alternatives have been reviewed and questions answered. She is agreeable.  Please see documentation in the medical record.   PROCEDURE NOTE:  O2 saturation, blood pressure, pulse, and respirations were  monitored throughout the entire procedure. Conscious sedation with Versed 4  mg IV and Demerol 75 mg IV in divided doses.   INSTRUMENT:  Olympus video chip system, pediatric scope.   FINDINGS:  Digital rectal exam revealed no abnormalities.   ENDOSCOPIC FINDINGS:  Prep was good.   Rectum:  Examination of the rectal mucosa including retroflexed view of the  anal verge  revealed only internal hemorrhoids.   Colon:  Colonic mucosa was surveyed from the rectosigmoid junction through  the left, transverse, and right colon to the area of the appendiceal  orifice, ileocecal valve, and cecum. These structures were well seen and  photographed for the record. Terminal ileum was intubated to 10 cm. From  this level, the scope was slowly and cautiously withdrawn, and all  previously mentioned mucosal surfaces were again seen. The colonic mucosa  appeared normal as did the terminal ileal mucosa. The patient tolerated the  procedure well and was reactive to endoscopy.   IMPRESSION:  Internal hemorrhoids. Otherwise, normal rectum, colon, terminal  ileum.   RECOMMENDATIONS:  Baseline CBC today. If her CBC is normal, no further GI  evaluation warranted unless she were to develop GI symptoms.      Jonathon Bellows, M.D.  Electronically Signed     RMR/MEDQ  D:  08/28/2005  T:  08/29/2005  Job:  [id number]   cc:   Lazaro Arms, M.D.  Fax: [phone]   Oneal Deputy. Juanetta Gosling, M.D.  Fax: [phone]

## 2011-01-31 ENCOUNTER — Telehealth: Payer: Self-pay | Admitting: Family Medicine

## 2011-01-31 MED ORDER — DIAZEPAM 5 MG PO TABS: ORAL_TABLET | ORAL | Status: DC

## 2011-01-31 NOTE — Telephone Encounter (Signed)
refilled 

## 2011-02-14 ENCOUNTER — Encounter: Payer: Self-pay | Admitting: Family Medicine

## 2011-02-17 ENCOUNTER — Encounter: Payer: Self-pay | Admitting: Family Medicine

## 2011-02-17 ENCOUNTER — Other Ambulatory Visit (HOSPITAL_COMMUNITY)
Admission: RE | Admit: 2011-02-17 | Discharge: 2011-02-17 | Disposition: A | Discharge status: Home / Self Care | Payer: 59 | Source: Ambulatory Visit | Attending: Family Medicine | Admitting: Family Medicine

## 2011-02-17 ENCOUNTER — Ambulatory Visit (INDEPENDENT_AMBULATORY_CARE_PROVIDER_SITE_OTHER): Payer: 59 | Admitting: Family Medicine

## 2011-02-17 VITALS — BP 130/90 | HR 77 | Resp 16 | Ht 66.5 in | Wt 128.1 lb

## 2011-02-17 DIAGNOSIS — L259 Unspecified contact dermatitis, unspecified cause: Secondary | ICD-10-CM

## 2011-02-17 DIAGNOSIS — F411 Generalized anxiety disorder: Secondary | ICD-10-CM

## 2011-02-17 DIAGNOSIS — Z1211 Encounter for screening for malignant neoplasm of colon: Secondary | ICD-10-CM

## 2011-02-17 DIAGNOSIS — Z23 Encounter for immunization: Secondary | ICD-10-CM

## 2011-02-17 DIAGNOSIS — Z2911 Encounter for prophylactic immunotherapy for respiratory syncytial virus (RSV): Secondary | ICD-10-CM

## 2011-02-17 DIAGNOSIS — E785 Hyperlipidemia, unspecified: Secondary | ICD-10-CM

## 2011-02-17 DIAGNOSIS — Z01419 Encounter for gynecological examination (general) (routine) without abnormal findings: Secondary | ICD-10-CM | POA: Insufficient documentation

## 2011-02-17 DIAGNOSIS — Z0000 Encounter for general adult medical examination without abnormal findings: Secondary | ICD-10-CM

## 2011-02-17 LAB — POC HEMOCCULT BLD/STL (OFFICE/1-CARD/DIAGNOSTIC): Fecal Occult Blood, POC: NEGATIVE

## 2011-02-17 MED ORDER — DIAZEPAM 5 MG PO TABS: ORAL_TABLET | ORAL | Status: DC

## 2011-02-17 MED ORDER — SIMVASTATIN 20 MG PO TABS: 20.0000 mg | ORAL_TABLET | Freq: Every day | ORAL | Status: DC

## 2011-02-17 MED ORDER — MOMETASONE FUROATE 0.1 % EX CREA: TOPICAL_CREAM | CUTANEOUS | Status: DC

## 2011-02-17 NOTE — Assessment & Plan Note (Signed)
Controlled, no change in medication  

## 2011-02-17 NOTE — Patient Instructions (Addendum)
F/u early December  Fasting lipid  And hepatic in 4 months, just before next visit.  Pls call Sept 12 or after for flu vaccine  zostavax today  Pls keep salt down and commit to 30 mins daily of exercise at least 5 days per week.   Blood pressure is elevated today.  Med is sent for rash, use sparingly

## 2011-02-20 NOTE — Progress Notes (Signed)
  Subjective:    Patient ID: Tamara Lam, female    DOB: 1951-03-10, 60 y.o.   MRN: [id number]  HPI The PT is here for annual examand re-evaluation of chronic medical conditions, medication management and review of any available recent lab and radiology data.  Preventive health is updated, specifically  Cancer screening and Immunization.   Questions or concerns regarding consultations or procedures which the PT has had in the interim are  addressed. The PT denies any adverse reactions to current medications since the last visit.  There are no new concerns.  C/o recent flare of eczema with red blotches on arms and face    Review of Systems Denies recent fever or chills. Denies sinus pressure, nasal congestion, ear pain or sore throat. Denies chest congestion, productive cough or wheezing. Denies chest pains, palpitations and leg swelling Denies abdominal pain, nausea, vomiting,diarrhea or constipation.   Denies dysuria, frequency, hesitancy or incontinence. Denies joint pain, swelling and limitation in mobility. Denies headaches, seizures, numbness, or tingling. Denies depression. Continues to experience a significant amount of  anxiety  And mild  insomnia.       Objective:   Physical Exam Pleasant well nourished female, alert and oriented x 3, in no cardio-pulmonary distress. Afebrile. HEENT No facial trauma or asymetry. Sinuses non tender.  EOMI, PERTL, glaucoma, followed by opthalmology  External ears normal, tympanic membranes clear. Oropharynx moist, no exudate,  Neck: supple, no adenopathy,JVD or thyromegaly.No bruits.  Chest: Clear to ascultation bilaterally.No crackles or wheezes. Non tender to palpation  Breast: No asymetry,no masses. No nipple discharge or inversion. No axillary or supraclavicular adenopathy  Cardiovascular system; Heart sounds normal,  S1 and  S2 ,no S3.  No murmur, or thrill. Apical beat not displaced Peripheral pulses  normal.  Abdomen: Soft, non tender, no organomegaly or masses. No bruits. Bowel sounds normal. No guarding, tenderness or rebound.  Rectal:  No mass. Guaiac negative stool.  GU: External genitalia normal. No lesions. Vaginal canal normal.No discharge. Uterus absent, no adnexal masses, no  adnexal tenderness.  Musculoskeletal exam: Full ROM of spine, hips , shoulders and knees. No deformity ,swelling or crepitus noted. No muscle wasting or atrophy.   Neurologic: Cranial nerves 2 to 12 intact. Power, tone ,sensation and reflexes normal throughout. No disturbance in gait. No tremor.  Skin: Intact,erythematous macular rash on forearms and face Pigmentation normal throughout  Psych; Normal mood and affect. Judgement and concentration normal        Assessment & Plan:

## 2011-02-20 NOTE — Assessment & Plan Note (Signed)
Improved, but still persists, not currently seeing therapist, does not feel she needs to

## 2011-02-20 NOTE — Assessment & Plan Note (Signed)
Flare of eczema, med prescribed

## 2011-03-04 ENCOUNTER — Other Ambulatory Visit: Payer: Self-pay | Admitting: Family Medicine

## 2011-03-12 ENCOUNTER — Ambulatory Visit (INDEPENDENT_AMBULATORY_CARE_PROVIDER_SITE_OTHER): Payer: 59 | Admitting: Family Medicine

## 2011-03-12 DIAGNOSIS — Z23 Encounter for immunization: Secondary | ICD-10-CM

## 2011-03-12 MED ORDER — INFLUENZA VAC TYPES A & B PF IM SUSP: 0.5000 mL | Freq: Once | INTRAMUSCULAR | Status: DC

## 2011-03-16 NOTE — Progress Notes (Signed)
  Subjective:    Patient ID: Tamara Lam, female    DOB: [birth year], 60 y.o.   MRN: [id number]  HPI    Review of Systems     Objective:   Physical Exam        Assessment & Plan:  Flu vaccine administered with no adverse effects

## 2011-05-07 ENCOUNTER — Telehealth: Payer: Self-pay | Admitting: Family Medicine

## 2011-05-08 ENCOUNTER — Other Ambulatory Visit: Payer: Self-pay | Admitting: Family Medicine

## 2011-05-08 MED ORDER — DIAZEPAM 5 MG PO TABS: 5.0000 mg | ORAL_TABLET | Freq: Three times a day (TID) | ORAL | Status: DC | PRN

## 2011-05-08 NOTE — Telephone Encounter (Signed)
Message of condolence left script printed to be faxed in am

## 2011-06-03 LAB — HEPATIC FUNCTION PANEL
AST: 18 U/L (ref 0–37)
Albumin: 4.4 g/dL (ref 3.5–5.2)
Alkaline Phosphatase: 46 U/L (ref 39–117)
Total Bilirubin: 0.4 mg/dL (ref 0.3–1.2)
Total Protein: 6.9 g/dL (ref 6.0–8.3)

## 2011-06-03 LAB — LIPID PANEL
HDL: 54 mg/dL (ref >39)
Total CHOL/HDL Ratio: 2.9 Ratio
Triglycerides: 78 mg/dL (ref <150)

## 2011-06-05 ENCOUNTER — Encounter: Payer: Self-pay | Admitting: Family Medicine

## 2011-06-06 ENCOUNTER — Encounter: Payer: Self-pay | Admitting: Family Medicine

## 2011-06-11 ENCOUNTER — Ambulatory Visit (INDEPENDENT_AMBULATORY_CARE_PROVIDER_SITE_OTHER): Payer: 59 | Admitting: Family Medicine

## 2011-06-11 ENCOUNTER — Encounter: Payer: Self-pay | Admitting: Family Medicine

## 2011-06-11 VITALS — BP 124/90 | HR 77 | Resp 16 | Ht 66.5 in | Wt 120.4 lb

## 2011-06-11 DIAGNOSIS — L259 Unspecified contact dermatitis, unspecified cause: Secondary | ICD-10-CM

## 2011-06-11 DIAGNOSIS — E785 Hyperlipidemia, unspecified: Secondary | ICD-10-CM

## 2011-06-11 DIAGNOSIS — Z13820 Encounter for screening for osteoporosis: Secondary | ICD-10-CM

## 2011-06-11 DIAGNOSIS — F411 Generalized anxiety disorder: Secondary | ICD-10-CM

## 2011-06-11 DIAGNOSIS — K5909 Other constipation: Secondary | ICD-10-CM

## 2011-06-11 MED ORDER — MIRTAZAPINE 15 MG PO TBDP: 15.0000 mg | ORAL_TABLET | Freq: Every day | ORAL | Status: AC

## 2011-06-11 NOTE — Assessment & Plan Note (Signed)
Improved and controlled, no med change 

## 2011-06-11 NOTE — Assessment & Plan Note (Signed)
Increased due to recentloss of spouse , will add remeron, poor sleep and weight loss, does not feel the need for therapy aththis time

## 2011-06-11 NOTE — Patient Instructions (Signed)
F/u in early March  New med remeron at bedtime for sleep, appetite and mild depression.  Try calcium with D gel capsule 1200mg /1000IU once daily for bones pls  Dexa will be scheduled for bone density in January   Accept my prayerful condolence in the loss of your spouse

## 2011-06-11 NOTE — Assessment & Plan Note (Signed)
Unchanged, has stopped prescription med , using vinegar on her scalp

## 2011-06-11 NOTE — Progress Notes (Signed)
  Subjective:    Patient ID: Tamara Lam, female    DOB: 1951-02-26, 60 y.o.   MRN: [id number]  HPI The PT is here for follow up and re-evaluation of chronic medical conditions, medication management and review of any available recent lab and radiology data.  Preventive health is updated, specifically  Cancer screening and Immunization.   Questions or concerns regarding consultations or procedures which the PT has had in the interim are  addressed. The PT denies any adverse reactions to current medications since the last visit.   Lost her spouse unexpectedly in the past 2 months, sleep is negatively affected, appetite is poor and she is moderately depressed, not suicidal or homicidal and reports support from family Has stopped using prescription med on scalp states it makes no difference, scalp still itches    Review of Systems See HPI Denies recent fever or chills. Denies sinus pressure, nasal congestion, ear pain or sore throat. Denies chest congestion, productive cough or wheezing. Denies chest pains, palpitations and leg swelling Denies abdominal pain, nausea, vomiting,diarrhea or constipation.   Denies dysuria, frequency, hesitancy or incontinence. Denies joint pain, swelling and limitation in mobility. Denies headaches, seizures, numbness, or tingling.  Denies skin break down or rash.        Objective:   Physical Exam Patient alert and oriented and in no cardiopulmonary distress.  HEENT: No facial asymmetry, EOMI, no sinus tenderness,  oropharynx pink and moist.  Neck supple no adenopathy.  Chest: Clear to auscultation bilaterally.  CVS: S1, S2 no murmurs, no S3.  ABD: Soft non tender. Bowel sounds normal.  Ext: No edema  MS: Adequate ROM spine, shoulders, hips and knees.  Skin: Intact, no ulcerations or rash noted.  Psych: Good eye contact, normal affect. Memory intact mildly depressed appearing.  CNS: CN 2-12 intact, power, tone and sensation normal  throughout.        Assessment & Plan:

## 2011-06-11 NOTE — Assessment & Plan Note (Signed)
Worsened with calcium so stopped med,. Needs dexa next year

## 2011-06-12 ENCOUNTER — Telehealth: Payer: Self-pay | Admitting: Family Medicine

## 2011-06-12 MED ORDER — DIAZEPAM 5 MG PO TABS: 5.0000 mg | ORAL_TABLET | Freq: Three times a day (TID) | ORAL | Status: DC | PRN

## 2011-06-12 NOTE — Telephone Encounter (Signed)
Will send in to [facility]

## 2011-06-19 ENCOUNTER — Ambulatory Visit (HOSPITAL_COMMUNITY)
Admission: RE | Admit: 2011-06-19 | Discharge: 2011-06-19 | Disposition: A | Discharge status: Home / Self Care | Payer: 59 | Source: Ambulatory Visit | Attending: Family Medicine | Admitting: Family Medicine

## 2011-06-19 DIAGNOSIS — M899 Disorder of bone, unspecified: Secondary | ICD-10-CM | POA: Insufficient documentation

## 2011-06-19 DIAGNOSIS — M949 Disorder of cartilage, unspecified: Secondary | ICD-10-CM | POA: Insufficient documentation

## 2011-06-19 DIAGNOSIS — Z13820 Encounter for screening for osteoporosis: Secondary | ICD-10-CM

## 2011-06-19 DIAGNOSIS — Z780 Asymptomatic menopausal state: Secondary | ICD-10-CM | POA: Insufficient documentation

## 2011-06-20 ENCOUNTER — Telehealth: Payer: Self-pay

## 2011-06-20 NOTE — Telephone Encounter (Signed)
Spoke with [facility] and they are needing a order for bone density which was recently performed

## 2011-07-07 ENCOUNTER — Other Ambulatory Visit: Payer: Self-pay | Admitting: Family Medicine

## 2011-07-07 DIAGNOSIS — M8580 Other specified disorders of bone density and structure, unspecified site: Secondary | ICD-10-CM

## 2011-07-10 ENCOUNTER — Other Ambulatory Visit: Payer: Self-pay

## 2011-07-10 DIAGNOSIS — M8580 Other specified disorders of bone density and structure, unspecified site: Secondary | ICD-10-CM

## 2011-07-10 MED ORDER — ALENDRONATE SODIUM 70 MG PO TABS: 70.0000 mg | ORAL_TABLET | ORAL | Status: DC

## 2011-07-15 ENCOUNTER — Encounter: Payer: Self-pay | Admitting: Internal Medicine

## 2011-07-21 ENCOUNTER — Other Ambulatory Visit (HOSPITAL_COMMUNITY): Payer: 59

## 2011-07-22 ENCOUNTER — Encounter: Payer: Self-pay | Admitting: Family Medicine

## 2011-08-04 ENCOUNTER — Other Ambulatory Visit: Payer: Self-pay | Admitting: Family Medicine

## 2011-08-04 DIAGNOSIS — Z1231 Encounter for screening mammogram for malignant neoplasm of breast: Secondary | ICD-10-CM

## 2011-08-07 ENCOUNTER — Telehealth: Payer: Self-pay | Admitting: Family Medicine

## 2011-08-07 MED ORDER — SIMVASTATIN 20 MG PO TABS: 20.0000 mg | ORAL_TABLET | Freq: Every day | ORAL | Status: DC

## 2011-08-07 NOTE — Telephone Encounter (Signed)
Sent in as requested 

## 2011-08-25 ENCOUNTER — Telehealth: Payer: Self-pay | Admitting: Family Medicine

## 2011-08-25 ENCOUNTER — Other Ambulatory Visit: Payer: Self-pay

## 2011-08-25 MED ORDER — DIAZEPAM 5 MG PO TABS: 5.0000 mg | ORAL_TABLET | Freq: Three times a day (TID) | ORAL | Status: DC | PRN

## 2011-08-25 NOTE — Telephone Encounter (Signed)
Med refilled.

## 2011-08-29 ENCOUNTER — Ambulatory Visit
Admission: RE | Admit: 2011-08-29 | Discharge: 2011-08-29 | Disposition: A | Discharge status: Home / Self Care | Payer: Private Health Insurance - Indemnity | Source: Ambulatory Visit | Attending: Family Medicine | Admitting: Family Medicine

## 2011-08-29 DIAGNOSIS — Z1231 Encounter for screening mammogram for malignant neoplasm of breast: Secondary | ICD-10-CM

## 2011-09-17 ENCOUNTER — Other Ambulatory Visit: Payer: Self-pay | Admitting: Family Medicine

## 2011-09-17 ENCOUNTER — Encounter: Payer: Self-pay | Admitting: Family Medicine

## 2011-09-17 ENCOUNTER — Ambulatory Visit (INDEPENDENT_AMBULATORY_CARE_PROVIDER_SITE_OTHER): Payer: Private Health Insurance - Indemnity | Admitting: Family Medicine

## 2011-09-17 VITALS — BP 122/74 | HR 89 | Resp 18 | Ht 66.5 in | Wt 119.1 lb

## 2011-09-17 DIAGNOSIS — R5381 Other malaise: Secondary | ICD-10-CM

## 2011-09-17 DIAGNOSIS — Z1321 Encounter for screening for nutritional disorder: Secondary | ICD-10-CM

## 2011-09-17 DIAGNOSIS — R7989 Other specified abnormal findings of blood chemistry: Secondary | ICD-10-CM

## 2011-09-17 DIAGNOSIS — E785 Hyperlipidemia, unspecified: Secondary | ICD-10-CM

## 2011-09-17 DIAGNOSIS — Z0000 Encounter for general adult medical examination without abnormal findings: Secondary | ICD-10-CM

## 2011-09-17 DIAGNOSIS — F411 Generalized anxiety disorder: Secondary | ICD-10-CM

## 2011-09-17 DIAGNOSIS — Z0189 Encounter for other specified special examinations: Secondary | ICD-10-CM

## 2011-09-17 DIAGNOSIS — R6889 Other general symptoms and signs: Secondary | ICD-10-CM

## 2011-09-17 DIAGNOSIS — F329 Major depressive disorder, single episode, unspecified: Secondary | ICD-10-CM

## 2011-09-17 DIAGNOSIS — F32A Depression, unspecified: Secondary | ICD-10-CM | POA: Insufficient documentation

## 2011-09-17 DIAGNOSIS — Z130 Encounter for screening for diseases of the blood and blood-forming organs and certain disorders involving the immune mechanism: Secondary | ICD-10-CM

## 2011-09-17 DIAGNOSIS — R5383 Other fatigue: Secondary | ICD-10-CM

## 2011-09-17 DIAGNOSIS — Z1329 Encounter for screening for other suspected endocrine disorder: Secondary | ICD-10-CM

## 2011-09-17 MED ORDER — MIRTAZAPINE 15 MG PO TBDP: 15.0000 mg | ORAL_TABLET | Freq: Every day | ORAL | Status: AC

## 2011-09-17 NOTE — Assessment & Plan Note (Signed)
Positive depression, and recent stress of unexpected death of her spouse, never started remeron but intends to Need B12, cbc, ab and tSH

## 2011-09-17 NOTE — Progress Notes (Signed)
  Subjective:    Patient ID: Tamara Lam, female    DOB: 1951-06-15, 61 y.o.   MRN: [id number]  HPI The PT is here for follow up and re-evaluation of chronic medical conditions, medication management and review of any available recent lab and radiology data.  Preventive health is updated, specifically  Cancer screening and Immunization.   Unfortunately, pt unexpectedly lost her spouse approx 4 months ago. Though not overtly depressedIi some ways, her screen is certainly positive, some of which I believe can be safely attributed to the grieving process. Her appetite is poor, she has lost weight and has very poor sleep. She c/o fatigue     Review of Systems See HPI Denies recent fever or chills. Denies sinus pressure, nasal congestion, ear pain or sore throat. Denies chest congestion, productive cough or wheezing. Denies chest pains, palpitations and leg swelling Denies abdominal pain, nausea, vomiting,diarrhea or constipation.   Denies dysuria, frequency, hesitancy or incontinence. Denies joint pain, swelling and limitation in mobility. Denies headaches, seizures, numbness, or tingling.   Denies skin break down or rash.        Objective:   Physical Exam Patient alert and oriented and in no cardiopulmonary distress.  HEENT: No facial asymmetry, EOMI, no sinus tenderness,  oropharynx pink and moist.  Neck supple no adenopathy.  Chest: Clear to auscultation bilaterally.  CVS: S1, S2 no murmurs, no S3.  ABD: Soft non tender. Bowel sounds normal.  Ext: No edema  MS: Adequate ROM spine, shoulders, hips and knees.  Skin: Intact, no ulcerations or rash noted.  Psych: Good eye contact, flat  affect. Memory intact  anxious and  depressed appearing.  CNS: CN 2-12 intact, power, tone and sensation normal throughout.        Assessment & Plan:

## 2011-09-17 NOTE — Patient Instructions (Addendum)
F/u in 3 month.  Please, if you are unable to take remeron , which I believe will help you to feel better and gain weight, call and let me know, since I do believe you need medication. You are coping well overall, and I am sure as time goes on and you start visiting nursing home patients and so on , this will help.  CBC, B12 and TSH and Vit D level check today

## 2011-09-18 LAB — CBC
HCT: 42.9 % (ref 36.0–46.0)
Hemoglobin: 12.7 g/dL (ref 12.0–15.0)
MCH: 27.1 pg (ref 26.0–34.0)
MCHC: 29.6 g/dL — ABNORMAL LOW (ref 30.0–36.0)
MCV: 91.5 fL (ref 78.0–100.0)
RBC: 4.69 MIL/uL (ref 3.87–5.11)

## 2011-09-18 LAB — VITAMIN D 25 HYDROXY (VIT D DEFICIENCY, FRACTURES): Vit D, 25-Hydroxy: 49 ng/mL (ref 30–89)

## 2011-09-18 LAB — VITAMIN B12: Vitamin B-12: 646 pg/mL (ref 211–911)

## 2011-09-19 LAB — T3, FREE: T3, Free: 3.2 pg/mL (ref 2.3–4.2)

## 2011-09-22 ENCOUNTER — Telehealth: Payer: Self-pay | Admitting: Family Medicine

## 2011-09-22 DIAGNOSIS — R7989 Other specified abnormal findings of blood chemistry: Secondary | ICD-10-CM | POA: Insufficient documentation

## 2011-09-22 NOTE — Assessment & Plan Note (Signed)
Increased anxiety and insomnia with recent loss of spouse

## 2011-09-22 NOTE — Assessment & Plan Note (Signed)
Controlled, no change in medication  

## 2011-09-22 NOTE — Telephone Encounter (Signed)
pls let pt know that her thyroid functions screening test is slightly low, which suggests her gland may be slightly overactive, sh needs to have a thyroid US to loofk further at the gland, I have put in referral so let schedulers know after you spk with her please

## 2011-09-24 NOTE — Telephone Encounter (Signed)
Patient aware.

## 2011-09-24 NOTE — Telephone Encounter (Signed)
Called patient and left message for them to return call at the office   

## 2011-09-30 ENCOUNTER — Other Ambulatory Visit: Payer: Self-pay | Admitting: Family Medicine

## 2011-09-30 ENCOUNTER — Ambulatory Visit (HOSPITAL_COMMUNITY)
Admission: RE | Admit: 2011-09-30 | Discharge: 2011-09-30 | Disposition: A | Discharge status: Home / Self Care | Payer: Private Health Insurance - Indemnity | Source: Ambulatory Visit | Attending: Family Medicine | Admitting: Family Medicine

## 2011-09-30 DIAGNOSIS — E042 Nontoxic multinodular goiter: Secondary | ICD-10-CM | POA: Insufficient documentation

## 2011-09-30 DIAGNOSIS — R7989 Other specified abnormal findings of blood chemistry: Secondary | ICD-10-CM

## 2011-09-30 DIAGNOSIS — E041 Nontoxic single thyroid nodule: Secondary | ICD-10-CM

## 2011-09-30 DIAGNOSIS — E349 Endocrine disorder, unspecified: Secondary | ICD-10-CM | POA: Insufficient documentation

## 2011-10-09 ENCOUNTER — Other Ambulatory Visit: Payer: Self-pay | Admitting: Family Medicine

## 2011-10-09 ENCOUNTER — Telehealth: Payer: Self-pay | Admitting: Family Medicine

## 2011-10-09 DIAGNOSIS — R7989 Other specified abnormal findings of blood chemistry: Secondary | ICD-10-CM

## 2011-10-09 NOTE — Telephone Encounter (Signed)
pls refer pt to dr Fransico Him, fax tsh report and thyroid US , eval of goiter an abn TSH.   Pls fax over paper and let Dr Luanne Bras office know that I am cancelling her appointment with [facility] at this time. Pt has also been asked to call and cancel the appt  Any questions, pls ask me

## 2011-10-15 NOTE — Progress Notes (Signed)
Pt was referred to dr nida office. They will call pt with appt. Pt was called and is aware of this referral

## 2011-10-20 ENCOUNTER — Other Ambulatory Visit (HOSPITAL_COMMUNITY): Payer: Self-pay | Admitting: "Endocrinology

## 2011-10-20 DIAGNOSIS — E049 Nontoxic goiter, unspecified: Secondary | ICD-10-CM

## 2011-10-23 ENCOUNTER — Ambulatory Visit (HOSPITAL_COMMUNITY)
Admission: RE | Admit: 2011-10-23 | Discharge: 2011-10-23 | Disposition: A | Discharge status: Home / Self Care | Payer: Private Health Insurance - Indemnity | Source: Ambulatory Visit | Attending: "Endocrinology | Admitting: "Endocrinology

## 2011-10-23 ENCOUNTER — Other Ambulatory Visit (HOSPITAL_COMMUNITY): Payer: Self-pay | Admitting: "Endocrinology

## 2011-10-23 VITALS — BP 134/81 | HR 83 | Temp 97.4°F | Resp 16

## 2011-10-23 DIAGNOSIS — E049 Nontoxic goiter, unspecified: Secondary | ICD-10-CM

## 2011-10-23 DIAGNOSIS — E042 Nontoxic multinodular goiter: Secondary | ICD-10-CM | POA: Insufficient documentation

## 2011-10-23 NOTE — Progress Notes (Signed)
Lidocaine 2%       2mL injected 

## 2011-11-17 ENCOUNTER — Telehealth: Payer: Self-pay | Admitting: Family Medicine

## 2011-11-17 MED ORDER — DIAZEPAM 5 MG PO TABS: 5.0000 mg | ORAL_TABLET | Freq: Three times a day (TID) | ORAL | Status: DC | PRN

## 2011-11-17 NOTE — Telephone Encounter (Signed)
Called in verbally to [facility]

## 2011-12-17 ENCOUNTER — Encounter: Payer: Self-pay | Admitting: Family Medicine

## 2011-12-17 ENCOUNTER — Ambulatory Visit (INDEPENDENT_AMBULATORY_CARE_PROVIDER_SITE_OTHER): Payer: Private Health Insurance - Indemnity | Admitting: Family Medicine

## 2011-12-17 VITALS — BP 122/82 | HR 82 | Resp 16 | Ht 66.5 in | Wt 120.4 lb

## 2011-12-17 DIAGNOSIS — F329 Major depressive disorder, single episode, unspecified: Secondary | ICD-10-CM

## 2011-12-17 DIAGNOSIS — R6889 Other general symptoms and signs: Secondary | ICD-10-CM

## 2011-12-17 DIAGNOSIS — F32A Depression, unspecified: Secondary | ICD-10-CM

## 2011-12-17 DIAGNOSIS — E785 Hyperlipidemia, unspecified: Secondary | ICD-10-CM

## 2011-12-17 DIAGNOSIS — R7989 Other specified abnormal findings of blood chemistry: Secondary | ICD-10-CM

## 2011-12-17 DIAGNOSIS — R0789 Other chest pain: Secondary | ICD-10-CM

## 2011-12-17 MED ORDER — SIMVASTATIN 20 MG PO TABS: 20.0000 mg | ORAL_TABLET | Freq: Every day | ORAL | Status: DC

## 2011-12-17 NOTE — Patient Instructions (Addendum)
F/u in 5 month with rectal exam  No changes in medication.  I am happy that the thyroid evaluation is normal  Please commit to regular physical activity 30 minutes every day.  Aim for at least 7 hours of sleep daily   You will have an EKG in the office for  chest pain   Fasting lipid and hepatic due August 20 or after

## 2011-12-17 NOTE — Progress Notes (Signed)
  Subjective:    Patient ID: Tamara Lam, female    DOB: [birth year], 61 y.o.   MRN: [id number]  HPI The PT is here for follow up and re-evaluation of chronic medical conditions, medication management and review of any available recent lab and radiology data.  Preventive health is updated, specifically  Cancer screening and Immunization.   Questions or concerns regarding consultations or procedures which the PT has had in the interim are  Addressed.Has had complete eval of abn thyroid function test with nodule on gland, all is benign and she requires no med The PT denies any adverse reactions to current medications since the last visit.  States this past weekend she experienced left chest tightness intermittently for approx 1 hours, no nausea, light headedness,  or diaphoresis, no palpitations. Pain had no specific aggravating or relieving factors, was not positional;. She is concerned as to whether this may represent heart disease. C/o excessive hot flashes ans wella s some mild depression, adamant she wants no "depression med" for any reason, she is intolerant to all     Review of Systems See HPI Denies recent fever or chills. Denies sinus pressure, nasal congestion, ear pain or sore throat. Denies chest congestion, productive cough or wheezing.  Denies abdominal pain, nausea, vomiting,diarrhea or constipation.   Denies dysuria, frequency, hesitancy or incontinence. Denies joint pain, swelling and limitation in mobility. Denies headaches, seizures, numbness, or tingling.  Denies skin break down or rash.        Objective:   Physical Exam Patient alert and oriented and in no cardiopulmonary distress.  HEENT: No facial asymmetry, EOMI, no sinus tenderness,  oropharynx pink and moist.  Neck supple no adenopathy.  Chest: Clear to auscultation bilaterally.no reproducible chest wall tenderness  CVS: S1, S2 no murmurs, no S3. EKG: NSR , no LVH, no ischemic changes ABD: Soft non  tender. Bowel sounds normal.  Ext: No edema  MS: Adequate ROM spine, shoulders, hips and knees.  Skin: Intact, no ulcerations or rash noted.  Psych: Good eye contact, normal affect. Memory intact not anxious or depressed appearing.  CNS: CN 2-12 intact, power, tone and sensation normal throughout.        Assessment & Plan:

## 2011-12-21 NOTE — Assessment & Plan Note (Signed)
Has been thoroughly investigated both by endo and ENT, no significant pathology

## 2011-12-21 NOTE — Assessment & Plan Note (Signed)
Voiced concern over possibility of CAD with atypical chest pain history, office EKG shows normal sinus rhythm with no ischemia

## 2011-12-21 NOTE — Assessment & Plan Note (Signed)
Hyperlipidemia:Low fat diet discussed and encouraged.  Controlled, no change in medication Updated lab before the end of this year

## 2011-12-21 NOTE — Assessment & Plan Note (Signed)
Improved, acute grief over husband's passing is much improved, consistently refuses medication, not suicidal or homicidal

## 2011-12-29 ENCOUNTER — Encounter: Payer: Self-pay | Admitting: Family Medicine

## 2012-02-12 ENCOUNTER — Other Ambulatory Visit: Payer: Self-pay | Admitting: Family Medicine

## 2012-06-08 ENCOUNTER — Other Ambulatory Visit: Payer: Self-pay | Admitting: Family Medicine

## 2012-06-09 LAB — HEPATIC FUNCTION PANEL
Alkaline Phosphatase: 47 U/L (ref 39–117)
Bilirubin, Direct: 0.1 mg/dL (ref 0.0–0.3)
Indirect Bilirubin: 0.4 mg/dL (ref 0.0–0.9)
Total Protein: 7 g/dL (ref 6.0–8.3)

## 2012-06-09 LAB — LIPID PANEL
LDL Cholesterol: 87 mg/dL (ref 0–99)
Triglycerides: 55 mg/dL (ref <150)

## 2012-06-16 ENCOUNTER — Encounter: Payer: Self-pay | Admitting: Family Medicine

## 2012-06-16 ENCOUNTER — Ambulatory Visit (INDEPENDENT_AMBULATORY_CARE_PROVIDER_SITE_OTHER): Payer: Private Health Insurance - Indemnity | Admitting: Family Medicine

## 2012-06-16 VITALS — BP 110/78 | HR 72 | Resp 16 | Ht 66.5 in | Wt 116.4 lb

## 2012-06-16 DIAGNOSIS — M8580 Other specified disorders of bone density and structure, unspecified site: Secondary | ICD-10-CM

## 2012-06-16 DIAGNOSIS — L259 Unspecified contact dermatitis, unspecified cause: Secondary | ICD-10-CM

## 2012-06-16 DIAGNOSIS — E785 Hyperlipidemia, unspecified: Secondary | ICD-10-CM

## 2012-06-16 DIAGNOSIS — F32A Depression, unspecified: Secondary | ICD-10-CM

## 2012-06-16 DIAGNOSIS — M949 Disorder of cartilage, unspecified: Secondary | ICD-10-CM

## 2012-06-16 DIAGNOSIS — Z1211 Encounter for screening for malignant neoplasm of colon: Secondary | ICD-10-CM

## 2012-06-16 DIAGNOSIS — F329 Major depressive disorder, single episode, unspecified: Secondary | ICD-10-CM

## 2012-06-16 DIAGNOSIS — M899 Disorder of bone, unspecified: Secondary | ICD-10-CM

## 2012-06-16 MED ORDER — ALENDRONATE SODIUM 70 MG PO TABS: 70.0000 mg | ORAL_TABLET | ORAL | Status: DC

## 2012-06-16 NOTE — Progress Notes (Signed)
  Subjective:    Patient ID: Tamara Lam, female    DOB: 09/22/1950, 61 y.o.   MRN: [id number]  HPI The PT is here for follow up and re-evaluation of chronic medical conditions, medication management and review of any available recent lab and radiology data.  Preventive health is updated, specifically  Cancer screening and Immunization.   Questions or concerns regarding consultations or procedures which the PT has had in the interim are  addressed. The PT denies any adverse reactions to current medications since the last visit.  There are no new concerns.  C/o excessive fatigue, poor appetite and weight loss. Depression screen is positive, grieving the 1 year unexpected loss of her spouse I believe has something to do with this. Continues to c/o itchy scalp, has upcoming derm appt. Has not remained commited to exercise  , but intends to change this as she does admit this helps her to feel better     Review of Systems .c1 Denies recent fever or chills. Denies sinus pressure, nasal congestion, ear pain or sore throat. Denies chest congestion, productive cough or wheezing. Denies chest pains, palpitations and leg swelling Denies abdominal pain, nausea, vomiting,diarrhea or constipation.   Denies dysuria, frequency, hesitancy or incontinence. Denies joint pain, swelling and limitation in mobility. Denies headaches, seizures, numbness, or tingling.       Objective:   Physical Exam  Patient alert and oriented and in no cardiopulmonary distress.  HEENT: No facial asymmetry, EOMI, no sinus tenderness,  oropharynx pink and moist.  Neck supple no adenopathy.  Chest: Clear to auscultation bilaterally.  CVS: S1, S2 no murmurs, no S3.  ABD: Soft non tender. Bowel sounds normal. Rectal: heme negative stool Ext: No edema  MS: Adequate ROM spine, shoulders, hips and knees.  Skin: Intact, no ulcerations or rash noted.  Psych: Good eye contact, blunted  affect. Memory intact not  anxious mildly  depressed appearing.Not suicidal or homicidal  CNS: CN 2-12 intact, power, tone and sensation normal throughout.       Assessment & Plan:

## 2012-06-16 NOTE — Patient Instructions (Addendum)
F/u in 6 month, call if you need me before  PLEASE start calcium with D 1200mg /1000IU one gel capsule once daily for bones Please start once weekly fosamax for bones to rebuild them    Your cholesterol is excellent  You need to start baby aspirin 81mg  once daily for stroke prevention  Take 1 multivitamin one daily  Start one Vit B12 OTC tablet once daily, St. John's Wort , OTC once daily. Start going to the gym at least 5 days per week. Consider volunteering at school or wherever you are interested in. Think of a hobby you can start, this will help Set a bedtime, and go to bed with no light or sound same time every night  Fasting lipid, cmp, vit B12 level, cbc in 6 month  Stool has no hidden blood , which is excellent

## 2012-06-24 ENCOUNTER — Telehealth: Payer: Self-pay | Admitting: Family Medicine

## 2012-06-24 MED ORDER — DIAZEPAM 5 MG PO TABS: ORAL_TABLET | ORAL | Status: DC

## 2012-06-24 NOTE — Telephone Encounter (Signed)
Refill sent.

## 2012-07-05 NOTE — Assessment & Plan Note (Signed)
Controlled, no change in medication Hyperlipidemia:Low fat diet discussed and encouraged.  \ 

## 2012-07-05 NOTE — Assessment & Plan Note (Signed)
Ongoing scalp itch, has upcoming appt with yet another dermatologist to evaluate this.Anxiety and mental health issues I believe are a major contributor, as the skin on the scalp is normal when examined

## 2012-07-05 NOTE — Assessment & Plan Note (Signed)
Pt encouraged to take calcium with D supplements as well as a bisphosphonate

## 2012-07-05 NOTE — Assessment & Plan Note (Signed)
Deteriorated, not suicidal or homicidal. Intloerant of ll medication and no interest in therapy, will work on lifestyle modification

## 2012-08-18 ENCOUNTER — Other Ambulatory Visit: Payer: Self-pay | Admitting: Family Medicine

## 2012-08-25 ENCOUNTER — Other Ambulatory Visit: Payer: Self-pay

## 2012-08-25 ENCOUNTER — Other Ambulatory Visit: Payer: Self-pay | Admitting: Family Medicine

## 2012-08-25 DIAGNOSIS — Z1231 Encounter for screening mammogram for malignant neoplasm of breast: Secondary | ICD-10-CM

## 2012-09-20 ENCOUNTER — Ambulatory Visit
Admission: RE | Admit: 2012-09-20 | Discharge: 2012-09-20 | Disposition: A | Discharge status: Home / Self Care | Payer: BC Managed Care – PPO | Source: Ambulatory Visit

## 2012-09-20 DIAGNOSIS — Z1231 Encounter for screening mammogram for malignant neoplasm of breast: Secondary | ICD-10-CM

## 2012-10-21 DIAGNOSIS — L662 Folliculitis decalvans: Secondary | ICD-10-CM | POA: Insufficient documentation

## 2012-12-15 ENCOUNTER — Ambulatory Visit (INDEPENDENT_AMBULATORY_CARE_PROVIDER_SITE_OTHER): Payer: BC Managed Care – PPO | Admitting: Family Medicine

## 2012-12-15 ENCOUNTER — Encounter: Payer: Self-pay | Admitting: Family Medicine

## 2012-12-15 VITALS — BP 106/72 | HR 80 | Resp 18 | Ht 66.5 in | Wt 118.0 lb

## 2012-12-15 DIAGNOSIS — L309 Dermatitis, unspecified: Secondary | ICD-10-CM

## 2012-12-15 DIAGNOSIS — E785 Hyperlipidemia, unspecified: Secondary | ICD-10-CM

## 2012-12-15 DIAGNOSIS — M8580 Other specified disorders of bone density and structure, unspecified site: Secondary | ICD-10-CM

## 2012-12-15 DIAGNOSIS — R6889 Other general symptoms and signs: Secondary | ICD-10-CM

## 2012-12-15 DIAGNOSIS — M899 Disorder of bone, unspecified: Secondary | ICD-10-CM

## 2012-12-15 DIAGNOSIS — F411 Generalized anxiety disorder: Secondary | ICD-10-CM

## 2012-12-15 DIAGNOSIS — R7989 Other specified abnormal findings of blood chemistry: Secondary | ICD-10-CM

## 2012-12-15 DIAGNOSIS — M949 Disorder of cartilage, unspecified: Secondary | ICD-10-CM

## 2012-12-15 DIAGNOSIS — L259 Unspecified contact dermatitis, unspecified cause: Secondary | ICD-10-CM

## 2012-12-15 MED ORDER — PREDNISONE (PAK) 10 MG PO TABS: 10.0000 mg | ORAL_TABLET | Freq: Two times a day (BID) | ORAL | Status: AC

## 2012-12-15 MED ORDER — DIAZEPAM 5 MG PO TABS: ORAL_TABLET | ORAL | Status: DC

## 2012-12-15 MED ORDER — SIMVASTATIN 20 MG PO TABS: ORAL_TABLET | ORAL | Status: DC

## 2012-12-15 MED ORDER — ESOMEPRAZOLE MAGNESIUM 40 MG PO CPDR: 40.0000 mg | DELAYED_RELEASE_CAPSULE | Freq: Every day | ORAL | Status: DC

## 2012-12-15 NOTE — Progress Notes (Signed)
  Subjective:    Patient ID: Tamara Lam, female    DOB: [birth year], 62 y.o.   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 Denies sinus pressure, nasal congestion, ear pain or sore throat. Denies chest congestion, productive cough or wheezing. Denies chest pains, palpitations and leg swelling Denies abdominal pain, nausea, vomiting,diarrhea or constipation.   Denies dysuria, frequency, hesitancy or incontinence. Denies joint pain, swelling and limitation in mobility. Denies headaches, seizures, numbness, or tingling. Denies depression, anxiety or insomnia.       Objective:   Physical Exam  Patient alert and oriented and in no cardiopulmonary distress.  HEENT: No facial asymmetry, EOMI, no sinus tenderness,  oropharynx pink and moist.  Neck supple no adenopathy.  Chest: Clear to auscultation bilaterally.  CVS: S1, S2 no murmurs, no S3.  ABD: Soft non tender. Bowel sounds normal.  Ext: No edema  MS: Adequate ROM spine, shoulders, hips and knees.  Skin:erythematous macular rash occupying mid and lower 2/3 of face, no drainage, no skin breakdown.  Psych: Good eye contact, normal affect. Memory intact not anxious or depressed appearing.  CNS: CN 2-12 intact, power, tone and sensation normal throughout.       Assessment & Plan:

## 2012-12-15 NOTE — Patient Instructions (Addendum)
F/u with rectal December 19 or after.  Call in October for your flu vaccine  Pls call if you need me before   CBC, fasting lipid, cmp and TSH end July (give prednisone a chance to get out of your system as may raise blood sugar)  You will be referred to see Dr Fransico Him early August for follow up  Prednisone for 5 days is prescribed for the rash on your face, start today,and I hope this helps the scalp also.  I strongly suggest you call for a sooner appt with your dermatologist at [facility] so they can see the rash on your face also  You will get a script and a coupon for nexium 1 daily for 1 week, to reduce the potential stomach irritation which you may develop

## 2012-12-18 NOTE — Assessment & Plan Note (Signed)
Controlled, no change in medication Med as before

## 2012-12-18 NOTE — Assessment & Plan Note (Signed)
Followed by endo, has appt in next 1 to 2 monht

## 2012-12-18 NOTE — Assessment & Plan Note (Signed)
Acute flare of rash on face, trial of prednisone, encouraged pt to have her derm see her asap

## 2012-12-18 NOTE — Assessment & Plan Note (Signed)
continue calcium and bishosphonate

## 2012-12-18 NOTE — Assessment & Plan Note (Signed)
Hyperlipidemia:Low fat diet discussed and encouraged.  Updated lab needed 

## 2013-02-02 ENCOUNTER — Other Ambulatory Visit (HOSPITAL_COMMUNITY): Payer: Self-pay | Admitting: "Endocrinology

## 2013-02-02 DIAGNOSIS — E049 Nontoxic goiter, unspecified: Secondary | ICD-10-CM

## 2013-02-03 ENCOUNTER — Ambulatory Visit (HOSPITAL_COMMUNITY)
Admission: RE | Admit: 2013-02-03 | Discharge: 2013-02-03 | Disposition: A | Discharge status: Home / Self Care | Payer: BC Managed Care – PPO | Source: Ambulatory Visit | Attending: "Endocrinology | Admitting: "Endocrinology

## 2013-02-03 DIAGNOSIS — E042 Nontoxic multinodular goiter: Secondary | ICD-10-CM | POA: Insufficient documentation

## 2013-02-03 DIAGNOSIS — E049 Nontoxic goiter, unspecified: Secondary | ICD-10-CM

## 2013-02-16 LAB — COMPREHENSIVE METABOLIC PANEL
AST: 21 U/L (ref 0–37)
Albumin: 4 g/dL (ref 3.5–5.2)
Alkaline Phosphatase: 46 U/L (ref 39–117)
BUN: 9 mg/dL (ref 6–23)
Calcium: 9.5 mg/dL (ref 8.4–10.5)
Chloride: 106 mEq/L (ref 96–112)
Glucose, Bld: 84 mg/dL (ref 70–99)
Potassium: 4 mEq/L (ref 3.5–5.3)
Sodium: 144 mEq/L (ref 135–145)
Total Protein: 6.8 g/dL (ref 6.0–8.3)

## 2013-02-16 LAB — CBC
HCT: 38.2 % (ref 36.0–46.0)
MCV: 87.4 fL (ref 78.0–100.0)
RBC: 4.37 MIL/uL (ref 3.87–5.11)
RDW: 14.7 % (ref 11.5–15.5)
WBC: 6.2 10*3/uL (ref 4.0–10.5)

## 2013-02-16 LAB — LIPID PANEL: LDL Cholesterol: 76 mg/dL (ref 0–99)

## 2013-03-28 ENCOUNTER — Ambulatory Visit (INDEPENDENT_AMBULATORY_CARE_PROVIDER_SITE_OTHER): Payer: BC Managed Care – PPO | Admitting: Family Medicine

## 2013-03-28 ENCOUNTER — Encounter: Payer: Self-pay | Admitting: Family Medicine

## 2013-03-28 VITALS — BP 106/64 | HR 86 | Resp 18 | Ht 66.5 in | Wt 120.0 lb

## 2013-03-28 DIAGNOSIS — K219 Gastro-esophageal reflux disease without esophagitis: Secondary | ICD-10-CM

## 2013-03-28 DIAGNOSIS — M79671 Pain in right foot: Secondary | ICD-10-CM | POA: Insufficient documentation

## 2013-03-28 DIAGNOSIS — Z23 Encounter for immunization: Secondary | ICD-10-CM

## 2013-03-28 DIAGNOSIS — L259 Unspecified contact dermatitis, unspecified cause: Secondary | ICD-10-CM

## 2013-03-28 DIAGNOSIS — M79609 Pain in unspecified limb: Secondary | ICD-10-CM

## 2013-03-28 DIAGNOSIS — M8580 Other specified disorders of bone density and structure, unspecified site: Secondary | ICD-10-CM

## 2013-03-28 DIAGNOSIS — F411 Generalized anxiety disorder: Secondary | ICD-10-CM

## 2013-03-28 DIAGNOSIS — M899 Disorder of bone, unspecified: Secondary | ICD-10-CM

## 2013-03-28 DIAGNOSIS — I889 Nonspecific lymphadenitis, unspecified: Secondary | ICD-10-CM

## 2013-03-28 DIAGNOSIS — E785 Hyperlipidemia, unspecified: Secondary | ICD-10-CM

## 2013-03-28 MED ORDER — GABAPENTIN 100 MG PO CAPS: ORAL_CAPSULE | ORAL | Status: DC

## 2013-03-28 MED ORDER — CEPHALEXIN 500 MG PO CAPS: 500.0000 mg | ORAL_CAPSULE | Freq: Two times a day (BID) | ORAL | Status: AC

## 2013-03-28 NOTE — Progress Notes (Signed)
  Subjective:    Patient ID: Tamara Lam, female    DOB: May 05, 1951, 62 y.o.   MRN: [id number]  HPI  Tenderness of both feet x 4 months, and she has laso noted blue spots on lateral aspects of both feet with no trauma in the past 2 week, concerned about poor circulation or blood clots Knot in left groin x 2 weeks, no fever, always  Feels cold. No other new concerns at this time. Still experiencing itching of the scalp, she has stopped using chemicals and is still bothered, under the care of dermatology  Review of Systems See HPI Denies recent fever or chills. Denies sinus pressure, nasal congestion, ear pain or sore throat. Denies chest congestion, productive cough or wheezing. Denies chest pains, palpitations and leg swelling Denies abdominal pain, nausea, vomiting,diarrhea or constipation.   Denies dysuria, frequency, hesitancy or incontinence. Denies joint pain, swelling and limitation in mobility. Denies headaches, seizures, numbness, or tingling. Denies depression, uncontrolled  anxiety or insomnia.       Objective:   Physical Exam  Patient alert and oriented and in no cardiopulmonary distress.  HEENT: No facial asymmetry, EOMI, no sinus tenderness,  oropharynx pink and moist.  Neck supple no adenopathy.  Chest: Clear to auscultation bilaterally.  CVS: S1, S2 no murmurs, no S3.Good pulses DP and posterior tibialis  ABD: Soft non tender. Bowel sounds normal.tender left inguinal node  Ext: No edema  MS: Adequate ROM spine, shoulders, hips and knees.  Skin: Intact, no ulcerations or rash noted.No erythema , no discoloration, even in extremities, area of patient's concern  Psych: Good eye contact, normal affect. Memory intact mildly anxious not depressed appearing.  CNS: CN 2-12 intact, power, tone and sensation normal throughout.       Assessment & Plan:

## 2013-03-28 NOTE — Patient Instructions (Addendum)
F/u with rectal in 4.5 month, call if you need me before     Flu vaccine today   Keflex prescribed for 1 week for swollen tender gland in the left groin  Neurontin 1 at bedtime prescribed for bilateral foot pain, take at bedtime  No sign of infection, poor circulation or blood clot in the feet   Fasting lipid and hepatic in 4 month, just before visit

## 2013-03-28 NOTE — Assessment & Plan Note (Addendum)
Left inguinal x 2 weeks, recent h/o left cervical, however has scalp lesions 1 week of keflex prescribed.  Pt to call back with recurrence or lack of resolution

## 2013-04-23 DIAGNOSIS — K219 Gastro-esophageal reflux disease without esophagitis: Secondary | ICD-10-CM | POA: Insufficient documentation

## 2013-04-23 NOTE — Assessment & Plan Note (Signed)
Continue fosamax and calcium 

## 2013-04-23 NOTE — Assessment & Plan Note (Signed)
Normal exam, likely related to nerve irritation in spine, trial of gabapentin low dose, pt to call back if no improvement

## 2013-04-23 NOTE — Assessment & Plan Note (Signed)
Controlled, no change in medication Updated lab next visit. Hyperlipidemia:Low fat diet discussed and encouraged.

## 2013-04-23 NOTE — Assessment & Plan Note (Signed)
Unchanged, affects scalp,managed by dermatology

## 2013-04-23 NOTE — Assessment & Plan Note (Signed)
Controlled continue medication 

## 2013-04-23 NOTE — Assessment & Plan Note (Signed)
Controlled, no change in medication  

## 2013-04-24 DIAGNOSIS — Z23 Encounter for immunization: Secondary | ICD-10-CM

## 2013-06-08 ENCOUNTER — Other Ambulatory Visit: Payer: Self-pay | Admitting: Family Medicine

## 2013-06-16 ENCOUNTER — Telehealth: Payer: Self-pay

## 2013-06-16 MED ORDER — AZITHROMYCIN 250 MG PO TABS: ORAL_TABLET | ORAL | Status: AC

## 2013-06-16 NOTE — Telephone Encounter (Signed)
z pack is being sent in, advise if no better with this medication needs evaluation at the urgent care, explain this antibiotic should trat her infection, it is a 5 day course but works for 1`0 days

## 2013-06-16 NOTE — Telephone Encounter (Signed)
Patient aware.

## 2013-07-06 ENCOUNTER — Ambulatory Visit: Payer: BC Managed Care – PPO | Admitting: Family Medicine

## 2013-07-25 LAB — HEPATIC FUNCTION PANEL
ALT: 14 U/L (ref 0–35)
AST: 19 U/L (ref 0–37)
Albumin: 4.3 g/dL (ref 3.5–5.2)
Alkaline Phosphatase: 48 U/L (ref 39–117)
Bilirubin, Direct: 0.1 mg/dL (ref 0.0–0.3)
Indirect Bilirubin: 0.4 mg/dL (ref 0.0–0.9)
Total Bilirubin: 0.5 mg/dL (ref 0.3–1.2)
Total Protein: 7.4 g/dL (ref 6.0–8.3)

## 2013-07-25 LAB — LIPID PANEL
Cholesterol: 171 mg/dL (ref 0–200)
HDL: 67 mg/dL (ref >39)
LDL Cholesterol: 91 mg/dL (ref 0–99)
Total CHOL/HDL Ratio: 2.6 Ratio
Triglycerides: 64 mg/dL (ref <150)
VLDL: 13 mg/dL (ref 0–40)

## 2013-08-01 ENCOUNTER — Encounter: Payer: Self-pay | Admitting: Family Medicine

## 2013-08-01 ENCOUNTER — Ambulatory Visit (INDEPENDENT_AMBULATORY_CARE_PROVIDER_SITE_OTHER): Payer: BC Managed Care – PPO | Admitting: Family Medicine

## 2013-08-01 VITALS — BP 118/64 | HR 92 | Resp 18 | Ht 66.5 in | Wt 118.0 lb

## 2013-08-01 DIAGNOSIS — E038 Other specified hypothyroidism: Secondary | ICD-10-CM

## 2013-08-01 DIAGNOSIS — R7989 Other specified abnormal findings of blood chemistry: Secondary | ICD-10-CM

## 2013-08-01 DIAGNOSIS — M8580 Other specified disorders of bone density and structure, unspecified site: Secondary | ICD-10-CM

## 2013-08-01 DIAGNOSIS — K219 Gastro-esophageal reflux disease without esophagitis: Secondary | ICD-10-CM

## 2013-08-01 DIAGNOSIS — E049 Nontoxic goiter, unspecified: Secondary | ICD-10-CM

## 2013-08-01 DIAGNOSIS — E041 Nontoxic single thyroid nodule: Secondary | ICD-10-CM

## 2013-08-01 DIAGNOSIS — E042 Nontoxic multinodular goiter: Secondary | ICD-10-CM | POA: Insufficient documentation

## 2013-08-01 DIAGNOSIS — E785 Hyperlipidemia, unspecified: Secondary | ICD-10-CM

## 2013-08-01 DIAGNOSIS — F411 Generalized anxiety disorder: Secondary | ICD-10-CM

## 2013-08-01 DIAGNOSIS — M949 Disorder of cartilage, unspecified: Secondary | ICD-10-CM

## 2013-08-01 DIAGNOSIS — M899 Disorder of bone, unspecified: Secondary | ICD-10-CM

## 2013-08-01 DIAGNOSIS — R946 Abnormal results of thyroid function studies: Secondary | ICD-10-CM

## 2013-08-01 DIAGNOSIS — N3000 Acute cystitis without hematuria: Secondary | ICD-10-CM

## 2013-08-01 DIAGNOSIS — L259 Unspecified contact dermatitis, unspecified cause: Secondary | ICD-10-CM

## 2013-08-01 DIAGNOSIS — H409 Unspecified glaucoma: Secondary | ICD-10-CM

## 2013-08-01 DIAGNOSIS — E039 Hypothyroidism, unspecified: Secondary | ICD-10-CM

## 2013-08-01 LAB — POCT URINALYSIS DIPSTICK
Bilirubin, UA: NEGATIVE
Glucose, UA: NEGATIVE
KETONES UA: NEGATIVE
Leukocytes, UA: NEGATIVE
Nitrite, UA: NEGATIVE
PH UA: 6.5
PROTEIN UA: NEGATIVE
Spec Grav, UA: 1.01
UROBILINOGEN UA: 0.2

## 2013-08-01 MED ORDER — DIAZEPAM 5 MG PO TABS: ORAL_TABLET | ORAL | Status: DC

## 2013-08-01 NOTE — Patient Instructions (Addendum)
  CPE mid to end August  Pls sched your mammogram due March 25 or after  You are referred to Dr Dorris Fetch about your thyroid gland  Cholesterol remains good and liver function test is normal , no change in medication for cholesterol  You are referred for dexa scan to re evaluate osteoperosis  Pls start regular exercise , this will improve all aspects of your health  TSH, free T3 and free T4 and vit D level on Thursday when you are getting blood for your dermatologist'  Ear exam is normal  Urine is being checked for infection I hope that your skin improves

## 2013-08-05 ENCOUNTER — Other Ambulatory Visit: Payer: Self-pay | Admitting: Family Medicine

## 2013-08-05 ENCOUNTER — Encounter: Payer: Self-pay | Admitting: Family Medicine

## 2013-08-05 LAB — T4, FREE: Free T4: 1.22 ng/dL (ref 0.80–1.80)

## 2013-08-05 LAB — T3, FREE: T3 FREE: 3.2 pg/mL (ref 2.3–4.2)

## 2013-08-05 LAB — TSH: TSH: 0.687 u[IU]/mL (ref 0.350–4.500)

## 2013-08-10 ENCOUNTER — Other Ambulatory Visit (HOSPITAL_COMMUNITY): Payer: BC Managed Care – PPO

## 2013-08-10 ENCOUNTER — Ambulatory Visit (HOSPITAL_COMMUNITY)
Admission: RE | Admit: 2013-08-10 | Discharge: 2013-08-10 | Disposition: A | Discharge status: Home / Self Care | Payer: BC Managed Care – PPO | Source: Ambulatory Visit | Attending: Family Medicine | Admitting: Family Medicine

## 2013-08-10 DIAGNOSIS — M8580 Other specified disorders of bone density and structure, unspecified site: Secondary | ICD-10-CM

## 2013-08-10 DIAGNOSIS — M899 Disorder of bone, unspecified: Secondary | ICD-10-CM | POA: Insufficient documentation

## 2013-08-10 DIAGNOSIS — Z780 Asymptomatic menopausal state: Secondary | ICD-10-CM | POA: Insufficient documentation

## 2013-08-10 DIAGNOSIS — R29890 Loss of height: Secondary | ICD-10-CM | POA: Insufficient documentation

## 2013-08-10 DIAGNOSIS — M949 Disorder of cartilage, unspecified: Principal | ICD-10-CM

## 2013-08-16 ENCOUNTER — Emergency Department (HOSPITAL_COMMUNITY): Payer: BC Managed Care – PPO

## 2013-08-16 ENCOUNTER — Encounter (HOSPITAL_COMMUNITY): Payer: Self-pay | Admitting: Emergency Medicine

## 2013-08-16 ENCOUNTER — Inpatient Hospital Stay (HOSPITAL_COMMUNITY)
Admission: EM | Admit: 2013-08-16 | Discharge: 2013-08-20 | DRG: 390 | Disposition: A | Discharge status: Home / Self Care | Payer: BC Managed Care – PPO | Attending: General Surgery | Admitting: General Surgery

## 2013-08-16 DIAGNOSIS — K5900 Constipation, unspecified: Secondary | ICD-10-CM | POA: Diagnosis present

## 2013-08-16 DIAGNOSIS — Z8249 Family history of ischemic heart disease and other diseases of the circulatory system: Secondary | ICD-10-CM

## 2013-08-16 DIAGNOSIS — K565 Intestinal adhesions [bands], unspecified as to partial versus complete obstruction: Principal | ICD-10-CM | POA: Diagnosis present

## 2013-08-16 DIAGNOSIS — Z83719 Family history of colon polyps, unspecified: Secondary | ICD-10-CM

## 2013-08-16 DIAGNOSIS — Z90710 Acquired absence of both cervix and uterus: Secondary | ICD-10-CM

## 2013-08-16 DIAGNOSIS — K56609 Unspecified intestinal obstruction, unspecified as to partial versus complete obstruction: Secondary | ICD-10-CM | POA: Diagnosis present

## 2013-08-16 DIAGNOSIS — H409 Unspecified glaucoma: Secondary | ICD-10-CM | POA: Diagnosis present

## 2013-08-16 DIAGNOSIS — R109 Unspecified abdominal pain: Secondary | ICD-10-CM

## 2013-08-16 DIAGNOSIS — Z8371 Family history of colonic polyps: Secondary | ICD-10-CM

## 2013-08-16 DIAGNOSIS — R112 Nausea with vomiting, unspecified: Secondary | ICD-10-CM

## 2013-08-16 DIAGNOSIS — Z801 Family history of malignant neoplasm of trachea, bronchus and lung: Secondary | ICD-10-CM

## 2013-08-16 DIAGNOSIS — F411 Generalized anxiety disorder: Secondary | ICD-10-CM | POA: Diagnosis present

## 2013-08-16 LAB — COMPREHENSIVE METABOLIC PANEL
ALT: 21 U/L (ref 0–35)
AST: 26 U/L (ref 0–37)
Albumin: 4.5 g/dL (ref 3.5–5.2)
Alkaline Phosphatase: 53 U/L (ref 39–117)
BUN: 16 mg/dL (ref 6–23)
CALCIUM: 10.9 mg/dL — AB (ref 8.4–10.5)
CO2: 29 mEq/L (ref 19–32)
CREATININE: 0.71 mg/dL (ref 0.50–1.10)
Chloride: 97 mEq/L (ref 96–112)
GFR calc Af Amer: >90 mL/min (ref >90)
GFR calc non Af Amer: >90 mL/min (ref >90)
Glucose, Bld: 141 mg/dL — ABNORMAL HIGH (ref 70–99)
Potassium: 3.7 mEq/L (ref 3.7–5.3)
Sodium: 142 mEq/L (ref 137–147)
TOTAL PROTEIN: 8.2 g/dL (ref 6.0–8.3)
Total Bilirubin: 0.4 mg/dL (ref 0.3–1.2)

## 2013-08-16 LAB — URINALYSIS, ROUTINE W REFLEX MICROSCOPIC
Bilirubin Urine: NEGATIVE
Glucose, UA: NEGATIVE mg/dL
Ketones, ur: 40 mg/dL — AB
LEUKOCYTES UA: NEGATIVE
NITRITE: NEGATIVE
Protein, ur: NEGATIVE mg/dL
SPECIFIC GRAVITY, URINE: 1.01 (ref 1.005–1.030)
Urobilinogen, UA: 0.2 mg/dL (ref 0.0–1.0)
pH: 7 (ref 5.0–8.0)

## 2013-08-16 LAB — CBC WITH DIFFERENTIAL/PLATELET
BASOS PCT: 0 % (ref 0–1)
Basophils Absolute: 0 10*3/uL (ref 0.0–0.1)
EOS ABS: 0 10*3/uL (ref 0.0–0.7)
EOS PCT: 0 % (ref 0–5)
HEMATOCRIT: 44.3 % (ref 36.0–46.0)
HEMOGLOBIN: 14.2 g/dL (ref 12.0–15.0)
Lymphocytes Relative: 10 % — ABNORMAL LOW (ref 12–46)
Lymphs Abs: 1.3 10*3/uL (ref 0.7–4.0)
MCH: 28.7 pg (ref 26.0–34.0)
MCHC: 32.1 g/dL (ref 30.0–36.0)
MCV: 89.5 fL (ref 78.0–100.0)
MONO ABS: 0.5 10*3/uL (ref 0.1–1.0)
MONOS PCT: 3 % (ref 3–12)
NEUTROS PCT: 87 % — AB (ref 43–77)
Neutro Abs: 11.8 10*3/uL — ABNORMAL HIGH (ref 1.7–7.7)
Platelets: 236 10*3/uL (ref 150–400)
RBC: 4.95 MIL/uL (ref 3.87–5.11)
RDW: 15.1 % (ref 11.5–15.5)
WBC: 13.5 10*3/uL — ABNORMAL HIGH (ref 4.0–10.5)

## 2013-08-16 LAB — URINE MICROSCOPIC-ADD ON

## 2013-08-16 LAB — LIPASE, BLOOD: LIPASE: 27 U/L (ref 11–59)

## 2013-08-16 LAB — LACTIC ACID, PLASMA: Lactic Acid, Venous: 1.7 mmol/L (ref 0.5–2.2)

## 2013-08-16 MED ORDER — ACETAMINOPHEN 325 MG PO TABS: 650.0000 mg | ORAL_TABLET | Freq: Four times a day (QID) | ORAL | Status: DC | PRN

## 2013-08-16 MED ORDER — ENOXAPARIN SODIUM 40 MG/0.4ML ~~LOC~~ SOLN
40.0000 mg | SUBCUTANEOUS | Status: DC
  Administered 2013-08-16 – 2013-08-19 (×4): 40 mg via SUBCUTANEOUS
  Filled 2013-08-16 (×4): qty 0.4

## 2013-08-16 MED ORDER — HYDROMORPHONE HCL PF 1 MG/ML IJ SOLN
1.0000 mg | INTRAMUSCULAR | Status: DC | PRN
  Administered 2013-08-16 – 2013-08-19 (×8): 1 mg via INTRAVENOUS
  Filled 2013-08-16 (×8): qty 1

## 2013-08-16 MED ORDER — KCL IN DEXTROSE-NACL 20-5-0.45 MEQ/L-%-% IV SOLN
INTRAVENOUS | Status: DC
  Administered 2013-08-16 – 2013-08-19 (×6): via INTRAVENOUS

## 2013-08-16 MED ORDER — ACETAMINOPHEN 650 MG RE SUPP: 650.0000 mg | Freq: Four times a day (QID) | RECTAL | Status: DC | PRN

## 2013-08-16 MED ORDER — ONDANSETRON HCL 4 MG/2ML IJ SOLN
4.0000 mg | Freq: Four times a day (QID) | INTRAMUSCULAR | Status: DC | PRN
  Administered 2013-08-17 – 2013-08-19 (×5): 4 mg via INTRAVENOUS
  Filled 2013-08-16 (×5): qty 2

## 2013-08-16 MED ORDER — SODIUM CHLORIDE 0.9 % IV SOLN
1000.0000 mL | Freq: Once | INTRAVENOUS | Status: AC
  Administered 2013-08-16: 1000 mL via INTRAVENOUS

## 2013-08-16 MED ORDER — TRAVOPROST 0.004 % OP SOLN
1.0000 [drp] | Freq: Every day | OPHTHALMIC | Status: DC
  Administered 2013-08-16 – 2013-08-19 (×4): 1 [drp] via OPHTHALMIC
  Filled 2013-08-16: qty 2.5

## 2013-08-16 MED ORDER — IOHEXOL 300 MG/ML  SOLN
50.0000 mL | Freq: Once | INTRAMUSCULAR | Status: AC | PRN
  Administered 2013-08-16: 50 mL via ORAL

## 2013-08-16 MED ORDER — SODIUM CHLORIDE 0.9 % IV SOLN
1000.0000 mL | INTRAVENOUS | Status: DC
  Administered 2013-08-16: 1000 mL via INTRAVENOUS

## 2013-08-16 MED ORDER — LORAZEPAM 2 MG/ML IJ SOLN: 1.0000 mg | INTRAMUSCULAR | Status: DC | PRN

## 2013-08-16 MED ORDER — HYDROMORPHONE HCL PF 1 MG/ML IJ SOLN
1.0000 mg | Freq: Once | INTRAMUSCULAR | Status: AC
  Administered 2013-08-16: 1 mg via INTRAVENOUS
  Filled 2013-08-16: qty 1

## 2013-08-16 MED ORDER — ONDANSETRON HCL 4 MG/2ML IJ SOLN
4.0000 mg | Freq: Once | INTRAMUSCULAR | Status: AC
  Administered 2013-08-16: 4 mg via INTRAVENOUS
  Filled 2013-08-16: qty 2

## 2013-08-16 MED ORDER — DIPHENHYDRAMINE HCL 50 MG/ML IJ SOLN: 12.5000 mg | Freq: Four times a day (QID) | INTRAMUSCULAR | Status: DC | PRN

## 2013-08-16 MED ORDER — IOHEXOL 300 MG/ML  SOLN
100.0000 mL | Freq: Once | INTRAMUSCULAR | Status: AC | PRN
  Administered 2013-08-16: 100 mL via INTRAVENOUS

## 2013-08-16 MED ORDER — DIPHENHYDRAMINE HCL 12.5 MG/5ML PO ELIX: 12.5000 mg | ORAL_SOLUTION | Freq: Four times a day (QID) | ORAL | Status: DC | PRN

## 2013-08-16 NOTE — H&P (Signed)
Tamara Lam is an 63 y.o. female.   Chief Complaint: Nausea and vomiting HPI: Patient presents with a less than 24-hour history of worsening nausea, vomiting, and abdominal distention. She states her last bowel movement was 48 hours ago. She's never had an episode like this before. She denies any other acute medical problem. CT scan the abdomen and pelvis reveals a small bowel obstruction most likely secondary to adhesive disease. She is status post hysterectomy and cholecystectomy in the remote past.  Past Medical History  Diagnosis Date  . Anxiety 1980's   . Glaucoma 2004    Dr. Venetia Maxon, laser to left eye Sept 2011  . Weight loss   . Chronic nausea   . Hematochezia   . Chronic constipation   . Abnormal celiac antibody panel   . Hx of colonoscopy 08/2005    Dr. Gala Romney / hemorrhoids   . Helicobacter pylori gastritis 04/2004    last EGD     Past Surgical History  Procedure Laterality Date  . Cholecystectomy  1990's  . Vesicovaginal fistula closure w/ tah  1989  . Arthoscopic surgery on right knee  2007    Family History  Problem Relation Age of Onset  . Hypertension Mother   . Glaucoma Mother   . Diverticulosis Mother   . Lung cancer Father   . Mental illness Sister   . GER disease Brother   . Colon polyps Brother   . Colon polyps Son    Social History:  reports that she has never smoked. She does not have any smokeless tobacco history on file. She reports that she does not drink alcohol. Her drug history is not on file.  Allergies: No Known Allergies   (Not in a hospital admission)  Results for orders placed during the hospital encounter of 08/16/13 (from the past 48 hour(s))  CBC WITH DIFFERENTIAL     Status: Abnormal   Collection Time    08/16/13  4:44 AM      Result Value Ref Range   WBC 13.5 (*) 4.0 - 10.5 K/uL   RBC 4.95  3.87 - 5.11 MIL/uL   Hemoglobin 14.2  12.0 - 15.0 g/dL   HCT 44.3  36.0 - 46.0 %   MCV 89.5  78.0 - 100.0 fL   MCH 28.7  26.0 - 34.0 pg    MCHC 32.1  30.0 - 36.0 g/dL   RDW 15.1  11.5 - 15.5 %   Platelets 236  150 - 400 K/uL   Neutrophils Relative % 87 (*) 43 - 77 %   Neutro Abs 11.8 (*) 1.7 - 7.7 K/uL   Lymphocytes Relative 10 (*) 12 - 46 %   Lymphs Abs 1.3  0.7 - 4.0 K/uL   Monocytes Relative 3  3 - 12 %   Monocytes Absolute 0.5  0.1 - 1.0 K/uL   Eosinophils Relative 0  0 - 5 %   Eosinophils Absolute 0.0  0.0 - 0.7 K/uL   Basophils Relative 0  0 - 1 %   Basophils Absolute 0.0  0.0 - 0.1 K/uL  COMPREHENSIVE METABOLIC PANEL     Status: Abnormal   Collection Time    08/16/13  4:44 AM      Result Value Ref Range   Sodium 142  137 - 147 mEq/L   Potassium 3.7  3.7 - 5.3 mEq/L   Chloride 97  96 - 112 mEq/L   CO2 29  19 - 32 mEq/L   Glucose, Bld 141 (*)  70 - 99 mg/dL   BUN 16  6 - 23 mg/dL   Creatinine, Ser 0.71  0.50 - 1.10 mg/dL   Calcium 10.9 (*) 8.4 - 10.5 mg/dL   Total Protein 8.2  6.0 - 8.3 g/dL   Albumin 4.5  3.5 - 5.2 g/dL   AST 26  0 - 37 U/L   ALT 21  0 - 35 U/L   Alkaline Phosphatase 53  39 - 117 U/L   Total Bilirubin 0.4  0.3 - 1.2 mg/dL   GFR calc non Af Amer >90  >90 mL/min   GFR calc Af Amer >90  >90 mL/min   Comment: (NOTE)     The eGFR has been calculated using the CKD EPI equation.     This calculation has not been validated in all clinical situations.     eGFR's persistently <90 mL/min signify possible Chronic Kidney     Disease.  LIPASE, BLOOD     Status: None   Collection Time    08/16/13  4:44 AM      Result Value Ref Range   Lipase 27  11 - 59 U/L  LACTIC ACID, PLASMA     Status: None   Collection Time    08/16/13  4:44 AM      Result Value Ref Range   Lactic Acid, Venous 1.7  0.5 - 2.2 mmol/L  URINALYSIS, ROUTINE W REFLEX MICROSCOPIC     Status: Abnormal   Collection Time    08/16/13  7:32 AM      Result Value Ref Range   Color, Urine YELLOW  YELLOW   APPearance CLEAR  CLEAR   Specific Gravity, Urine 1.010  1.005 - 1.030   pH 7.0  5.0 - 8.0   Glucose, UA NEGATIVE  NEGATIVE  mg/dL   Hgb urine dipstick TRACE (*) NEGATIVE   Bilirubin Urine NEGATIVE  NEGATIVE   Ketones, ur 40 (*) NEGATIVE mg/dL   Protein, ur NEGATIVE  NEGATIVE mg/dL   Urobilinogen, UA 0.2  0.0 - 1.0 mg/dL   Nitrite NEGATIVE  NEGATIVE   Leukocytes, UA NEGATIVE  NEGATIVE  URINE MICROSCOPIC-ADD ON     Status: None   Collection Time    08/16/13  7:32 AM      Result Value Ref Range   RBC / HPF 0-2  <3 RBC/hpf   Ct Abdomen Pelvis W Contrast  08/16/2013   CLINICAL DATA:  Epigastric abdominal pain and vomiting. Previous cholecystectomy, hysterectomy and vesico vaginal fistula repair.  EXAM: CT ABDOMEN AND PELVIS WITH CONTRAST  TECHNIQUE: Multidetector CT imaging of the abdomen and pelvis was performed using the standard protocol following bolus administration of intravenous contrast.  CONTRAST:  139m OMNIPAQUE IOHEXOL 300 MG/ML  SOLN  COMPARISON:  08/04/2005 and pelvic ultrasound dated 03/18/2010.  FINDINGS: Multiple dilated proximal small bowel loops with normal caliber distal loops. The stomach is also dilated. The transition to normal caliber small bowel is in the right upper pelvis with some focal luminal narrowing at that location. No hernia seen. No evidence of appendicitis. Cholecystectomy clips. Small amount of free peritoneal fluid.  Tiny bilateral renal calculi. No bladder or ureteral calculi seen and no hydronephrosis. Surgically absent uterus. No adnexal masses or enlarged lymph nodes.  Unremarkable spleen, pancreas and adrenal glands. Minimal atelectasis at both lung bases. Minimal lumbar spine degenerative changes.  IMPRESSION: 1. Small bowel obstruction, most likely due to an adhesion in the upper right pelvis. 2. Small amount of free peritoneal fluid. 3.  Minimal bibasilar atelectasis. 4. Tiny, bilateral, nonobstructing renal calculi.   Electronically Signed   By: Enrique Sack M.D.   On: 08/16/2013 07:44    Review of Systems  Constitutional: Positive for malaise/fatigue.  Respiratory: Negative.    Cardiovascular: Negative.   Gastrointestinal: Positive for nausea, vomiting and abdominal pain.  Genitourinary: Negative.   Skin: Negative.   All other systems reviewed and are negative.    Blood pressure 145/86, pulse 80, temperature 98.2 F (36.8 C), temperature source Oral, resp. rate 16, height '5\' 7"'  (1.702 m), weight 53.524 kg (118 lb), SpO2 98.00%. Physical Exam  Constitutional: She is oriented to person, place, and time. She appears well-developed and well-nourished.  HENT:  Head: Normocephalic and atraumatic.  Neck: Normal range of motion. Neck supple.  Cardiovascular: Normal rate, regular rhythm and normal heart sounds.   Respiratory: Effort normal and breath sounds normal.  GI: Soft. She exhibits distension. There is no tenderness. There is no rebound.  Decreased bowel sounds noted. No rigidity noted. No hernias noted.  Genitourinary:  Rectal examination unremarkable.  Neurological: She is alert and oriented to person, place, and time.  Skin: Skin is warm and dry.     Assessment/Plan Impression: Small bowel obstruction secondary to adhesive disease Plan: Will admit the patient to the hospital for bowel decompression and rest. No need for acute surgical intervention at this time. Will monitor situation. The management plan has been discussed with the patient, who agrees to the treatment plan.  Ted Goodner A 08/16/2013, 9:20 AM

## 2013-08-16 NOTE — Progress Notes (Signed)
Utilization Review Complete  
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
## 2013-08-16 NOTE — ED Provider Notes (Signed)
Patient care assumed from Dr. Roxanne Mins at shift change. Patient is a 63 year old female who presents with abdominal cramping and bloating since yesterday evening. She has vomited several times. Care was signed out to me awaiting results of a CT scan. This was performed and reveals a small bowel obstruction. I've discussed these results with Dr. Arnoldo Morale who will evaluate the patient in the ER and determine the final disposition.  Veryl Speak, MD 08/16/13 [phone]

## 2013-08-16 NOTE — Progress Notes (Signed)
Pt came up to floor from ED. PT VS are stable and she is in NAD except for complaining of nausea. Will continue to monitor.

## 2013-08-16 NOTE — ED Notes (Signed)
Pt in CT.

## 2013-08-16 NOTE — ED Notes (Signed)
Onset of "gassy" stomach 6pm, progressed to epigastric pain then vomiting. Now feels weak with 9/10 epigastric/abd pain

## 2013-08-16 NOTE — ED Notes (Signed)
Pt c/o pain and nausea, notified EDP and medications given.

## 2013-08-16 NOTE — Progress Notes (Signed)
Placed #16 fr NG tube to pt's left nare. Pt tolerated procedure well. Auscultated for placement, had Gerlean Ren, RN as second nurse to confirm placement via auscultation. NG tube hooked up to low intermittent suction, will continue to monitor.

## 2013-08-17 LAB — BASIC METABOLIC PANEL
BUN: 9 mg/dL (ref 6–23)
CHLORIDE: 102 meq/L (ref 96–112)
CO2: 30 mEq/L (ref 19–32)
Calcium: 8.9 mg/dL (ref 8.4–10.5)
Creatinine, Ser: 0.61 mg/dL (ref 0.50–1.10)
GFR calc Af Amer: >90 mL/min (ref >90)
GFR calc non Af Amer: >90 mL/min (ref >90)
Glucose, Bld: 125 mg/dL — ABNORMAL HIGH (ref 70–99)
Potassium: 4.2 mEq/L (ref 3.7–5.3)
Sodium: 139 mEq/L (ref 137–147)

## 2013-08-17 LAB — CBC
HCT: 37.3 % (ref 36.0–46.0)
Hemoglobin: 11.7 g/dL — ABNORMAL LOW (ref 12.0–15.0)
MCH: 29 pg (ref 26.0–34.0)
MCHC: 31.4 g/dL (ref 30.0–36.0)
MCV: 92.3 fL (ref 78.0–100.0)
PLATELETS: 227 10*3/uL (ref 150–400)
RBC: 4.04 MIL/uL (ref 3.87–5.11)
RDW: 15.5 % (ref 11.5–15.5)
WBC: 11.4 10*3/uL — AB (ref 4.0–10.5)

## 2013-08-17 LAB — PHOSPHORUS: Phosphorus: 2.5 mg/dL (ref 2.3–4.6)

## 2013-08-17 LAB — MAGNESIUM: Magnesium: 2.1 mg/dL (ref 1.5–2.5)

## 2013-08-17 MED ORDER — PANTOPRAZOLE SODIUM 40 MG IV SOLR
40.0000 mg | Freq: Every day | INTRAVENOUS | Status: DC
  Administered 2013-08-17 – 2013-08-20 (×4): 40 mg via INTRAVENOUS
  Filled 2013-08-17 (×4): qty 40

## 2013-08-17 MED ORDER — MENTHOL 3 MG MT LOZG
1.0000 | LOZENGE | OROMUCOSAL | Status: DC | PRN
  Administered 2013-08-17: 3 mg via ORAL
  Filled 2013-08-17: qty 9

## 2013-08-17 NOTE — Care Management Note (Signed)
    Page 1 of 1   08/17/2013     11:15:28 AM   CARE MANAGEMENT NOTE 08/17/2013  Patient:  Tamara Lam, Tamara Lam   Account Number:  [id number]  Date Initiated:  08/17/2013  Documentation initiated by:  Claretha Cooper  Subjective/Objective Assessment:   Pt admitted from home where he lives with her son. States she is independent with ADL and does not anticipate [facility] DME needs.     Action/Plan:   Anticipated DC Date:  08/20/2013   Anticipated DC Plan:  [place]  CM consult      Choice offered to / List presented to:             Status of service:  Completed, signed off Medicare Important Message given?   (If response is "NO", the following Medicare IM given date fields will be blank) Date Medicare IM given:   Date Additional Medicare IM given:    Discharge Disposition:    Per UR Regulation:    If discussed at Long Length of Stay Meetings, dates discussed:    Comments:  08/17/13 Claretha Cooper RN BSN CM

## 2013-08-17 NOTE — Progress Notes (Signed)
Subjective: Less abdominal pain noted. No flatus or bowel movement yet.  Objective: Vital signs in last 24 hours: Temp:  [97.4 F (36.3 C)-98.3 F (36.8 C)] 98.2 F (36.8 C) (02/18 1300) Pulse Rate:  [76-86] 76 (02/18 1300) Resp:  [20] 20 (02/18 1300) BP: (100-139)/(51-72) 115/72 mmHg (02/18 1300) SpO2:  [95 %-100 %] 96 % (02/18 1300) Last BM Date: 08/15/13  Intake/Output from previous day: 02/17 0701 - 02/18 0700 In: 1556.7 [I.V.:1556.7] Out: 2025 [Urine:900; Emesis/NG output:1125] Intake/Output this shift:    General appearance: alert, cooperative and no distress Resp: clear to auscultation bilaterally Cardio: regular rate and rhythm, S1, S2 normal, no murmur, click, rub or gallop GI: Soft. Less distention noted. Minimal bowel sounds appreciated. No rigidity noted.  Lab Results:   Recent Labs  08/16/13 0444 08/17/13 0534  WBC 13.5* 11.4*  HGB 14.2 11.7*  HCT 44.3 37.3  PLT 236 227   BMET  Recent Labs  08/16/13 0444 08/17/13 0534  NA 142 139  K 3.7 4.2  CL 97 102  CO2 29 30  GLUCOSE 141* 125*  BUN 16 9  CREATININE 0.71 0.61  CALCIUM 10.9* 8.9   PT/INR No results found for this basename: LABPROT, INR,  in the last 72 hours  Studies/Results: Ct Abdomen Pelvis W Contrast  08/16/2013   CLINICAL DATA:  Epigastric abdominal pain and vomiting. Previous cholecystectomy, hysterectomy and vesico vaginal fistula repair.  EXAM: CT ABDOMEN AND PELVIS WITH CONTRAST  TECHNIQUE: Multidetector CT imaging of the abdomen and pelvis was performed using the standard protocol following bolus administration of intravenous contrast.  CONTRAST:  130mL OMNIPAQUE IOHEXOL 300 MG/ML  SOLN  COMPARISON:  08/04/2005 and pelvic ultrasound dated 03/18/2010.  FINDINGS: Multiple dilated proximal small bowel loops with normal caliber distal loops. The stomach is also dilated. The transition to normal caliber small bowel is in the right upper pelvis with some focal luminal narrowing at that  location. No hernia seen. No evidence of appendicitis. Cholecystectomy clips. Small amount of free peritoneal fluid.  Tiny bilateral renal calculi. No bladder or ureteral calculi seen and no hydronephrosis. Surgically absent uterus. No adnexal masses or enlarged lymph nodes.  Unremarkable spleen, pancreas and adrenal glands. Minimal atelectasis at both lung bases. Minimal lumbar spine degenerative changes.  IMPRESSION: 1. Small bowel obstruction, most likely due to an adhesion in the upper right pelvis. 2. Small amount of free peritoneal fluid. 3. Minimal bibasilar atelectasis. 4. Tiny, bilateral, nonobstructing renal calculi.   Electronically Signed   By: Enrique Sack M.D.   On: 08/16/2013 07:44    Anti-infectives: Anti-infectives   None      Assessment/Plan: Impression: Small bowel structure most likely secondary to adhesive disease, slightly improved since yesterday. No return of bowel function noted yet. Plan: Continue current management. Continue NG tube decompression.  LOS: 1 day    Tamara Lam A 08/17/2013

## 2013-08-17 NOTE — Progress Notes (Signed)
Notified Dr. Arnoldo Morale that pts NGT output had changed color during the night per report. Output was green in color and changed to brown/reddish in color. When I arrived to dept at 0730 output was still brown/red looking with coffee ground appearance. At this time, output has changed to a tan/white color with some redness. Order received for 40mg  Protonix IV Q24hr. Dr. Arnoldo Morale will see pt later today. Marry Guan

## 2013-08-18 LAB — CBC
HEMATOCRIT: 37 % (ref 36.0–46.0)
HEMOGLOBIN: 11.7 g/dL — AB (ref 12.0–15.0)
MCH: 29.4 pg (ref 26.0–34.0)
MCHC: 31.6 g/dL (ref 30.0–36.0)
MCV: 93 fL (ref 78.0–100.0)
Platelets: 205 10*3/uL (ref 150–400)
RBC: 3.98 MIL/uL (ref 3.87–5.11)
RDW: 15.4 % (ref 11.5–15.5)
WBC: 8.8 10*3/uL (ref 4.0–10.5)

## 2013-08-18 LAB — BASIC METABOLIC PANEL
BUN: 4 mg/dL — AB (ref 6–23)
CHLORIDE: 101 meq/L (ref 96–112)
CO2: 30 meq/L (ref 19–32)
Calcium: 8.8 mg/dL (ref 8.4–10.5)
Creatinine, Ser: 0.6 mg/dL (ref 0.50–1.10)
GFR calc Af Amer: >90 mL/min (ref >90)
GFR calc non Af Amer: >90 mL/min (ref >90)
GLUCOSE: 118 mg/dL — AB (ref 70–99)
POTASSIUM: 4.1 meq/L (ref 3.7–5.3)
Sodium: 139 mEq/L (ref 137–147)

## 2013-08-18 NOTE — Progress Notes (Signed)
Pt ambulated in hallway, she ambulated past nurses station. Pt tolerated very well. Pt states that she has begun to pass gas periodically. Marry Guan

## 2013-08-18 NOTE — Progress Notes (Signed)
  Subjective: No significant bowel movement yet. No abdominal pain noted.  Objective: Vital signs in last 24 hours: Temp:  [97.2 F (36.2 C)-99 F (37.2 C)] 97.2 F (36.2 C) (02/19 0528) Pulse Rate:  [73-80] 80 (02/19 0528) Resp:  [20] 20 (02/19 0528) BP: (115-136)/(68-75) 125/75 mmHg (02/19 0528) SpO2:  [96 %-99 %] 98 % (02/19 0528) Last BM Date: 08/15/13  Intake/Output from previous day: 02/18 0701 - 02/19 0700 In: 1076.7 [I.V.:1076.7] Out: 2575 [Urine:2575] Intake/Output this shift: Total I/O In: 1076.7 [I.V.:1076.7] Out: 1650 [Urine:1650]  General appearance: alert, cooperative and no distress Resp: clear to auscultation bilaterally Cardio: regular rate and rhythm, S1, S2 normal, no murmur, click, rub or gallop GI: Soft. Occasional bowel sounds appreciated. No distention noted.  Lab Results:   Recent Labs  08/17/13 0534 08/18/13 0557  WBC 11.4* 8.8  HGB 11.7* 11.7*  HCT 37.3 37.0  PLT 227 205   BMET  Recent Labs  08/16/13 0444 08/17/13 0534  NA 142 139  K 3.7 4.2  CL 97 102  CO2 29 30  GLUCOSE 141* 125*  BUN 16 9  CREATININE 0.71 0.61  CALCIUM 10.9* 8.9   PT/INR No results found for this basename: LABPROT, INR,  in the last 72 hours  Studies/Results: Ct Abdomen Pelvis W Contrast  08/16/2013   CLINICAL DATA:  Epigastric abdominal pain and vomiting. Previous cholecystectomy, hysterectomy and vesico vaginal fistula repair.  EXAM: CT ABDOMEN AND PELVIS WITH CONTRAST  TECHNIQUE: Multidetector CT imaging of the abdomen and pelvis was performed using the standard protocol following bolus administration of intravenous contrast.  CONTRAST:  187mL OMNIPAQUE IOHEXOL 300 MG/ML  SOLN  COMPARISON:  08/04/2005 and pelvic ultrasound dated 03/18/2010.  FINDINGS: Multiple dilated proximal small bowel loops with normal caliber distal loops. The stomach is also dilated. The transition to normal caliber small bowel is in the right upper pelvis with some focal luminal  narrowing at that location. No hernia seen. No evidence of appendicitis. Cholecystectomy clips. Small amount of free peritoneal fluid.  Tiny bilateral renal calculi. No bladder or ureteral calculi seen and no hydronephrosis. Surgically absent uterus. No adnexal masses or enlarged lymph nodes.  Unremarkable spleen, pancreas and adrenal glands. Minimal atelectasis at both lung bases. Minimal lumbar spine degenerative changes.  IMPRESSION: 1. Small bowel obstruction, most likely due to an adhesion in the upper right pelvis. 2. Small amount of free peritoneal fluid. 3. Minimal bibasilar atelectasis. 4. Tiny, bilateral, nonobstructing renal calculi.   Electronically Signed   By: Enrique Sack M.D.   On: 08/16/2013 07:44    Anti-infectives: Anti-infectives   None      Assessment/Plan: Impression: Small bowel obstruction, no significant return of bowel function yet. Plan: Continue NG tube decompression.  LOS: 2 days    Tamara Lam A 08/18/2013

## 2013-08-19 NOTE — Progress Notes (Signed)
  Subjective: Has passed gas. No bowel movement yet. No abdominal pain noted.  Objective: Vital signs in last 24 hours: Temp:  [98.1 F (36.7 C)-98.6 F (37 C)] 98.4 F (36.9 C) (02/20 0504) Pulse Rate:  [81-84] 81 (02/20 0504) Resp:  [18-20] 18 (02/20 0504) BP: (104-134)/(62-80) 131/80 mmHg (02/20 0504) SpO2:  [95 %-98 %] 97 % (02/20 0504) Last BM Date: 08/15/13  Intake/Output from previous day: 02/19 0701 - 02/20 0700 In: 1843.8 [I.V.:1843.8] Out: 3300 [Urine:3100; Emesis/NG output:200] Intake/Output this shift:    General appearance: alert, cooperative and no distress Resp: clear to auscultation bilaterally Cardio: regular rate and rhythm, S1, S2 normal, no murmur, click, rub or gallop GI: soft, non-tender; bowel sounds normal; no masses,  no organomegaly  Lab Results:   Recent Labs  08/17/13 0534 08/18/13 0557  WBC 11.4* 8.8  HGB 11.7* 11.7*  HCT 37.3 37.0  PLT 227 205   BMET  Recent Labs  08/17/13 0534 08/18/13 0557  NA 139 139  K 4.2 4.1  CL 102 101  CO2 30 30  GLUCOSE 125* 118*  BUN 9 4*  CREATININE 0.61 0.60  CALCIUM 8.9 8.8   PT/INR No results found for this basename: LABPROT, INR,  in the last 72 hours  Studies/Results: No results found.  Anti-infectives: Anti-infectives   None      Assessment/Plan: Impression: Small bowel obstruction, resolving Plan: Will remove NG tube and advance to full liquid diet. Will urge ambulation. No need for surgical intervention at this time.  LOS: 3 days    Tamara Lam A 08/19/2013

## 2013-08-20 DIAGNOSIS — H409 Unspecified glaucoma: Secondary | ICD-10-CM | POA: Insufficient documentation

## 2013-08-20 DIAGNOSIS — N3001 Acute cystitis with hematuria: Secondary | ICD-10-CM | POA: Insufficient documentation

## 2013-08-20 NOTE — Discharge Summary (Signed)
Physician Discharge Summary  Patient ID: Tamara Lam MRN: [id number] DOB/AGE: [birth year] 63 y.o.  Admit date: 08/16/2013 Discharge date: 08/20/2013  Admission Diagnoses: Small bowel obstruction secondary to adhesive disease  Discharge Diagnoses: Same Active Problems:   Small bowel obstruction   Discharged Condition: good  Hospital Course: Patient is a 63 year old black female who presented to the emergency room with worsening nausea, vomiting, abdominal distention. She is on CT scan the abdomen and pelvis to have a small bowel obstruction most likely secondary to adhesive disease. She was admitted the hospital for NG tube decompression and IV fluids. Once her bowel function returned, her diet was advanced. She has had multiple bowel movements over the past 24 hours. She does not require surgical intervention. She is being discharged home in good improving condition.  Discharge Exam: Blood pressure 124/80, pulse 61, temperature 97.9 F (36.6 C), temperature source Oral, resp. rate 20, height 5\' 7"  (1.702 m), weight 54.035 kg (119 lb 2 oz), SpO2 97.00%. General appearance: alert, cooperative and no distress Resp: clear to auscultation bilaterally Cardio: regular rate and rhythm, S1, S2 normal, no murmur, click, rub or gallop GI: soft, non-tender; bowel sounds normal; no masses,  no organomegaly  Disposition: Home   Future Appointments Provider Department Dept Phone   02/20/2014 1:00 PM Fayrene Helper, MD [facility] [phone]   Patient should bring all necessary paperwork to be completed.  Arrive 15 minutes prior to the appointment.       Medication List         alendronate 70 MG tablet  Commonly known as:  FOSAMAX  TAKE 1 TABLET EVERY WEEK IN THE MORNING 30 MIN BEFORE EATING WITH AN 8OZ GLASS OF WATER (SIT UP 30 MIN)     aspirin EC 81 MG tablet  Take 1 tablet (81 mg total) by mouth daily.     calcium-vitamin D 500-200 MG-UNIT per tablet  Commonly known  as:  OSCAL WITH D  Take 1 tablet by mouth daily.     CENTRUM SILVER PO  Take 1 tablet by mouth daily.     diazepam 5 MG tablet  Commonly known as:  VALIUM  - One tablet once daily, as needed, for anxiety   -   - Ten tablets to last 30 days     docusate sodium 100 MG capsule  Commonly known as:  COLACE  Take 200 mg by mouth daily as needed for mild constipation.     folic acid 1 MG tablet  Commonly known as:  FOLVITE  Take 1 mg by mouth daily.     methotrexate 2.5 MG tablet  Commonly known as:  RHEUMATREX  Take 10 mg by mouth once a week. Four tablets once weekly.     simvastatin 20 MG tablet  Commonly known as:  ZOCOR  TAKE (1) TABLET BY MOUTH AT BEDTIME FOR CHOLESTEROL.     TRAVATAN 0.004 % ophthalmic solution  Generic drug:  travoprost (benzalkonium)  Place 1 drop into both eyes at bedtime.           Follow-up Information   Follow up with Jamesetta So, MD. (As needed)    Specialty:  General Surgery   Contact information:   [address] [place] [address] [phone]       Signed: Aviva Signs A 08/20/2013, 12:04 PM

## 2013-08-20 NOTE — Assessment & Plan Note (Signed)
Chronic scaling of scalp follpowed for years by multiple different dermatologists, now on methotrexate , which is new , her provider is at [facility]

## 2013-08-20 NOTE — Assessment & Plan Note (Signed)
Controlled, no change in medication Hyperlipidemia:Low fat diet discussed and encouraged.  \ 

## 2013-08-20 NOTE — Discharge Instructions (Signed)
Small Bowel Obstruction °A small bowel obstruction means something is blocking the small intestine. The small intestine is the long tube that connects the stomach to the colon. Treatment depends on what is causing the problem. Treatment also depends on how bad the problem is. °HOME CARE °Your doctor may let you go home if your small bowel is not completely blocked. °· Rest. °· Follow your diet as told by your doctor. °· Only drink clear liquids until you start to get better. °· Avoid solid foods as told by your doctor. °· Only take medicine as told by your doctor. °GET HELP RIGHT AWAY IF: °· You have pain or cramps that get worse. °· You throw up (vomit) blood. °· You feel sick to your stomach (nauseous), or you cannot stop throwing up. °· You cannot drink fluids. °· You feel confused. °· You feel dry or thirsty (dehydrated). °· Your belly gets more puffy (bloated). °· You have chills. °· You have a fever. °· You feel weak, or you pass out (faint). °MAKE SURE YOU: °· Understand these instructions. °· Will watch your condition. °· Will get help right away if you are not doing well or get worse. °Document Released: 07/24/2004 Document Revised: 09/08/2011 Document Reviewed: 09/24/2010 °[employer]® Patient Information ©2014 [employer]. ° °

## 2013-08-20 NOTE — Progress Notes (Signed)
Pt states she has had several small bowel movements throughout the night and has been passing gas.  Pt ate 75% of her breakfast this morning.

## 2013-08-20 NOTE — Assessment & Plan Note (Signed)
Needs update dexa, also advised pt of the need to take daily calcium with d and commit to regular exercise

## 2013-08-20 NOTE — Assessment & Plan Note (Signed)
Treated and re evaluated on a regula basis, reports doing fairly well as far as response is concerned

## 2013-08-20 NOTE — Progress Notes (Signed)
   Subjective:    Patient ID: Tamara Lam, female    DOB: Mar 29, 1951, 63 y.o.   MRN: [id number]  HPI The PT is here for follow up and re-evaluation of chronic medical conditions, medication management and review of any available recent lab and radiology data.  Preventive health is updated, specifically  Cancer screening and Immunization.   Questions or concerns regarding consultations or procedures which the PT has had in the interim are  Addressed.Has been seeing the dermatologist at [facility], started on new medication and has lab work due this week she will have her thyroid labs added as well which will be forwarded to endo The PT denies any adverse reactions to current medications since the last visit.  C/o ear pressure bilaterally, and wants this checked for impaction, no hearing loss or drainage noted       Review of Systems See HPI Denies recent fever or chills. Denies sinus pressure, nasal congestion, ear pain or sore throat. Denies chest congestion, productive cough or wheezing. Denies chest pains, palpitations and leg swelling Denies abdominal pain, nausea, vomiting.   2 day h/o urinary frequency and mild dysuria, no flank pain , fever , chills or nausea. Denies joint pain, swelling and limitation in mobility. Denies headaches, seizures, numbness, or tingling. Denies depression, anxiety or insomnia. Denies skin break down or rash.        Objective:   Physical Exam  BP 118/64  Pulse 92  Resp 18  Ht 5' 6.5" (1.689 m)  Wt 118 lb 0.6 oz (53.543 kg)  BMI 18.77 kg/m2  SpO2 99% Patient alert and oriented and in no cardiopulmonary distress.  HEENT: No facial asymmetry, EOMI, no sinus tenderness,  oropharynx pink and moist.  Neck supple no adenopathy.TM clear bilaterally no cerumen impaction, normal outer ear exam. Goiter  Chest: Clear to auscultation bilaterally.  CVS: S1, S2 no murmurs, no S3.  ABD: Soft non tender. Bowel sounds normal.No renal angle or suprapubic  tenderness  Ext: No edema  MS: Adequate ROM spine, shoulders, hips and knees.  Skin: Intact, no ulcerations or rash noted.  Psych: Good eye contact, normal affect. Memory intact not anxious or depressed appearing.  CNS: CN 2-12 intact, power, tone and sensation normal throughout.       Assessment & Plan:  HYPERLIPIDEMIA Controlled, no change in medication Hyperlipidemia:Low fat diet discussed and encouraged.    ANXIETY Controlled with use of ativan , continue same  Goiter Followed by endo, thyroid function is normal, needs to make f/u appt with endo however  GERD (gastroesophageal reflux disease) Uses protonix on as needed basis , will try to wean off if possible  Acute cystitis Symptomatic , however UA is negative so patient is reassured  Osteopenia Needs update dexa, also advised pt of the need to take daily calcium with d and commit to regular exercise  DERMATITIS Chronic scaling of scalp follpowed for years by multiple different dermatologists, now on methotrexate , which is new , her provider is at [place] Treated and re evaluated on a regula basis, reports doing fairly well as far as response is concerned

## 2013-08-20 NOTE — Assessment & Plan Note (Signed)
Uses protonix on as needed basis , will try to wean off if possible

## 2013-08-20 NOTE — Assessment & Plan Note (Signed)
Followed by endo, thyroid function is normal, needs to make f/u appt with endo however

## 2013-08-20 NOTE — Assessment & Plan Note (Signed)
Symptomatic , however UA is negative so patient is reassured

## 2013-08-20 NOTE — Assessment & Plan Note (Signed)
Controlled with use of ativan , continue same

## 2013-08-22 NOTE — Progress Notes (Signed)
UR chart review completed.  

## 2013-08-30 ENCOUNTER — Other Ambulatory Visit: Payer: Self-pay

## 2013-08-30 DIAGNOSIS — Z1231 Encounter for screening mammogram for malignant neoplasm of breast: Secondary | ICD-10-CM

## 2013-09-23 ENCOUNTER — Telehealth: Payer: Self-pay

## 2013-09-23 NOTE — Telephone Encounter (Signed)
Yes ok 

## 2013-09-23 NOTE — Telephone Encounter (Signed)
Patient aware.  Will call the week of 4/6 to schedule.

## 2013-09-26 ENCOUNTER — Ambulatory Visit
Admission: RE | Admit: 2013-09-26 | Discharge: 2013-09-26 | Disposition: A | Discharge status: Home / Self Care | Payer: Self-pay | Source: Ambulatory Visit

## 2013-09-26 DIAGNOSIS — Z1231 Encounter for screening mammogram for malignant neoplasm of breast: Secondary | ICD-10-CM

## 2013-11-03 ENCOUNTER — Other Ambulatory Visit: Payer: Self-pay | Admitting: Family Medicine

## 2013-11-23 ENCOUNTER — Other Ambulatory Visit: Payer: Self-pay | Admitting: Family Medicine

## 2014-01-07 DIAGNOSIS — B350 Tinea barbae and tinea capitis: Secondary | ICD-10-CM | POA: Insufficient documentation

## 2014-02-09 ENCOUNTER — Encounter: Payer: BC Managed Care – PPO | Admitting: Family Medicine

## 2014-02-20 ENCOUNTER — Other Ambulatory Visit (HOSPITAL_COMMUNITY)
Admission: RE | Admit: 2014-02-20 | Discharge: 2014-02-20 | Disposition: A | Discharge status: Home / Self Care | Payer: BC Managed Care – PPO | Source: Ambulatory Visit | Attending: Family Medicine | Admitting: Family Medicine

## 2014-02-20 ENCOUNTER — Encounter: Payer: Self-pay | Admitting: Family Medicine

## 2014-02-20 ENCOUNTER — Ambulatory Visit (INDEPENDENT_AMBULATORY_CARE_PROVIDER_SITE_OTHER): Payer: BC Managed Care – PPO | Admitting: Family Medicine

## 2014-02-20 VITALS — BP 130/84 | HR 89 | Resp 16 | Ht 66.5 in | Wt 117.1 lb

## 2014-02-20 DIAGNOSIS — E785 Hyperlipidemia, unspecified: Secondary | ICD-10-CM

## 2014-02-20 DIAGNOSIS — Z1151 Encounter for screening for human papillomavirus (HPV): Secondary | ICD-10-CM | POA: Insufficient documentation

## 2014-02-20 DIAGNOSIS — F411 Generalized anxiety disorder: Secondary | ICD-10-CM

## 2014-02-20 DIAGNOSIS — Z1211 Encounter for screening for malignant neoplasm of colon: Secondary | ICD-10-CM

## 2014-02-20 DIAGNOSIS — F419 Anxiety disorder, unspecified: Secondary | ICD-10-CM | POA: Insufficient documentation

## 2014-02-20 DIAGNOSIS — L259 Unspecified contact dermatitis, unspecified cause: Secondary | ICD-10-CM

## 2014-02-20 DIAGNOSIS — Z23 Encounter for immunization: Secondary | ICD-10-CM

## 2014-02-20 DIAGNOSIS — Z01419 Encounter for gynecological examination (general) (routine) without abnormal findings: Secondary | ICD-10-CM | POA: Diagnosis present

## 2014-02-20 DIAGNOSIS — Z124 Encounter for screening for malignant neoplasm of cervix: Secondary | ICD-10-CM

## 2014-02-20 DIAGNOSIS — Z0000 Encounter for general adult medical examination without abnormal findings: Secondary | ICD-10-CM

## 2014-02-20 LAB — POC HEMOCCULT BLD/STL (OFFICE/1-CARD/DIAGNOSTIC): Fecal Occult Blood, POC: NEGATIVE

## 2014-02-20 MED ORDER — DIAZEPAM 5 MG PO TABS: ORAL_TABLET | ORAL | Status: DC

## 2014-02-20 MED ORDER — BUSPIRONE HCL 5 MG PO TABS: 5.0000 mg | ORAL_TABLET | Freq: Three times a day (TID) | ORAL | Status: DC

## 2014-02-20 NOTE — Patient Instructions (Addendum)
F/u mid October, call if you need me before  Fasting lipid, cmp and CBc when you get labs for Dr Dorris Fetch  Flu vaccine today  NEW for anxiety is buspar , take every 8 hours on schedule, I believe this will help you a lot, call if o problems   Generalized Anxiety Disorder Generalized anxiety disorder (GAD) is a mental disorder. It interferes with life functions, including relationships, work, and school. GAD is different from normal anxiety, which everyone experiences at some point in their lives in response to specific life events and activities. Normal anxiety actually helps [place] prepare for and get through these life events and activities. Normal anxiety goes away after the event or activity is over.  GAD causes anxiety that is not necessarily related to specific events or activities. It also causes excess anxiety in proportion to specific events or activities. The anxiety associated with GAD is also difficult to control. GAD can vary from mild to severe. People with severe GAD can have intense waves of anxiety with physical symptoms (panic attacks).  SYMPTOMS The anxiety and worry associated with GAD are difficult to control. This anxiety and worry are related to many life events and activities and also occur more days than not for 6 months or longer. People with GAD also have three or more of the following symptoms (one or more in children):  Restlessness.   Fatigue.  Difficulty concentrating.   Irritability.  Muscle tension.  Difficulty sleeping or unsatisfying sleep. DIAGNOSIS GAD is diagnosed through an assessment by your health care provider. Your health care provider will ask you questions aboutyour mood,physical symptoms, and events in your life. Your health care provider may ask you about your medical history and use of alcohol or drugs, including prescription medicines. Your health care provider may also do a physical exam and blood tests. Certain medical conditions and the use  of certain substances can cause symptoms similar to those associated with GAD. Your health care provider may refer you to a mental health specialist for further evaluation. TREATMENT The following therapies are usually used to treat GAD:   Medication. Antidepressant medication usually is prescribed for long-term daily control. Antianxiety medicines may be added in severe cases, especially when panic attacks occur.   Talk therapy (psychotherapy). Certain types of talk therapy can be helpful in treating GAD by providing support, education, and guidance. A form of talk therapy called cognitive behavioral therapy can teach you healthy ways to think about and react to daily life events and activities.  Stress managementtechniques. These include yoga, meditation, and exercise and can be very helpful when they are practiced regularly. A mental health specialist can help determine which treatment is best for you. Some people see improvement with one therapy. However, other people require a combination of therapies. Document Released: 10/11/2012 Document Revised: 10/31/2013 Document Reviewed: 10/11/2012 [facility] Patient Information 2015 [place], [place]. This information is not intended to replace advice given to you by your health care provider. Make sure you discuss any questions you have with your health care provider.

## 2014-02-22 LAB — CYTOLOGY - PAP

## 2014-02-26 ENCOUNTER — Other Ambulatory Visit: Payer: Self-pay | Admitting: Family Medicine

## 2014-03-06 DIAGNOSIS — Z0000 Encounter for general adult medical examination without abnormal findings: Secondary | ICD-10-CM | POA: Insufficient documentation

## 2014-03-06 DIAGNOSIS — Z23 Encounter for immunization: Secondary | ICD-10-CM | POA: Insufficient documentation

## 2014-03-06 NOTE — Assessment & Plan Note (Signed)
Updated lab needed at/ before next visit. Hyperlipidemia:Low fat diet discussed and encouraged.  Controlled when last checked

## 2014-03-06 NOTE — Assessment & Plan Note (Signed)
Uncontrolled, pt has chronic anxiety which has worsened since the loss of her spouse, visible tremor. Start buspar on regular schedule

## 2014-03-06 NOTE — Progress Notes (Signed)
   Subjective:    Patient ID: Tamara Lam, female    DOB: 1950/11/01, 63 y.o.   MRN: [id number]  HPI Patient is in for annual physical exam. Flu vaccine is administered as this is due She c/o increased and uncontrolled anxiety, obvious visible tremor of hands, and heart beating hard and fast during exam Notes that she has panic attacks at times also. Symptoms have worsened since her spouse's passing,  She does have a son living with her and denies being afraid on her own Scalp condition has improved since she has started regularly using oral antifungal, currently being treated in [place] See HPI     Objective:   Physical Exam  BP 130/84  Pulse 89  Resp 16  Ht 5' 6.5" (1.689 m)  Wt 117 lb 1.9 oz (53.125 kg)  BMI 18.62 kg/m2  SpO2 99% Pleasant adequately  female, alert and oriented x 3, in no cardio-pulmonary distress.Anxious , with visible tremor of both hands  Afebrile. HEENT No facial trauma or asymetry. Sinuses non tender.  EOMI, PERTL,  External ears normal, tympanic membranes clear. Oropharynx moist, no exudate, good dentition. Neck: supple, no adenopathy,JVD or thyromegaly.No bruits.  Chest: Clear to ascultation bilaterally.No crackles or wheezes. Non tender to palpation  Breast: No asymetry,no masses or lumps. No tenderness. No nipple discharge or inversion. No axillary or supraclavicular adenopathy  Cardiovascular system; Heart sounds normal,  S1 and  S2 ,no S3.  No murmur, or thrill. Apical beat not displaced Peripheral pulses normal.  Abdomen: Soft, non tender, no organomegaly or masses. No bruits. Bowel sounds normal. No guarding, tenderness or rebound.  Rectal:  Normal sphincter tone. No mass.No rectal masses.  Guaiac negative stool.  GU: External genitalia normal female genitalia , female distribution of hair. No lesions. Urethral meatus normal in size, no  Prolapse, no lesions visibly  Present. Bladder non  tender. Vagina pink and moist , with no visible lesions , discharge present . Adequate pelvic support no  cystocele or rectocele noted  Uterus absent, no adnexal masses, no  adnexal tenderness.   Musculoskeletal exam: Full ROM of spine, hips , shoulders and knees. No deformity ,swelling or crepitus noted. No muscle wasting or atrophy.   Neurologic: Cranial nerves 2 to 12 intact. Power, tone ,sensation and reflexes normal throughout. No disturbance in gait. No tremor.  Skin: Intact, no ulceration, erythema , scaling or rash noted. Pigmentation normal throughout  Psych; Normal mood and affect. Judgement and concentration normal       Assessment & Plan:  Annual physical exam Annual exam as documented. Counseling done  re healthy lifestyle involving commitment to 150 minutes exercise per week, heart healthy diet, and attaining healthy weight.The importance of adequate sleep also discussed. Regular seat belt use , is also discussed.  Immunization and cancer screening needs are specifically addressed at this visit.   Generalized anxiety disorder Uncontrolled, pt has chronic anxiety which has worsened since the loss of her spouse, visible tremor. Start buspar on regular schedule  Need for prophylactic vaccination and inoculation against influenza Vaccine administered at visit.   DERMATITIS Improved on antifungal tabs by dermatology  HYPERLIPIDEMIA Updated lab needed at/ before next visit. Hyperlipidemia:Low fat diet discussed and encouraged.  Controlled when last checked

## 2014-03-06 NOTE — Assessment & Plan Note (Signed)
Vaccine administered at visit.  

## 2014-03-06 NOTE — Assessment & Plan Note (Signed)
Annual exam as documented. Counseling done  re healthy lifestyle involving commitment to 150 minutes exercise per week, heart healthy diet, and attaining healthy weight.The importance of adequate sleep also discussed. Regular seat belt use , is also discussed.  Immunization and cancer screening needs are specifically addressed at this visit.

## 2014-03-06 NOTE — Assessment & Plan Note (Signed)
Improved on antifungal tabs by dermatology

## 2014-03-21 LAB — LIPID PANEL

## 2014-03-21 LAB — COMPREHENSIVE METABOLIC PANEL

## 2014-03-21 LAB — CBC
HCT: 39.3 % (ref 36.0–46.0)
HEMOGLOBIN: 12.6 g/dL (ref 12.0–15.0)
MCH: 28 pg (ref 26.0–34.0)
MCHC: 32.1 g/dL (ref 30.0–36.0)
MCV: 87.3 fL (ref 78.0–100.0)
Platelets: 181 10*3/uL (ref 150–400)
RBC: 4.5 MIL/uL (ref 3.87–5.11)
RDW: 14.6 % (ref 11.5–15.5)
WBC: 4.6 10*3/uL (ref 4.0–10.5)

## 2014-04-11 ENCOUNTER — Telehealth: Payer: Self-pay | Admitting: Family Medicine

## 2014-04-11 LAB — HM DIABETES EYE EXAM

## 2014-04-11 NOTE — Telephone Encounter (Signed)
Referral entered  

## 2014-04-11 NOTE — Telephone Encounter (Signed)
Spoke with pt re cholesterol , liver and kidney function being excellent and at goal. Her HBa1C is abnormal at 6.5 , she is already aware per Dr Dorris Fetch. I encouraged her to go to diabetic ed at  His office , she has agreed please send over referral. She also understands the need to commit to dialy exercise for 51mins

## 2014-04-27 ENCOUNTER — Ambulatory Visit: Payer: BC Managed Care – PPO | Admitting: Family Medicine

## 2014-05-01 ENCOUNTER — Encounter: Payer: Self-pay | Admitting: Nutrition

## 2014-05-01 ENCOUNTER — Other Ambulatory Visit (HOSPITAL_COMMUNITY): Payer: Self-pay | Admitting: "Endocrinology

## 2014-05-01 ENCOUNTER — Encounter: Payer: BC Managed Care – PPO | Attending: Family Medicine | Admitting: Nutrition

## 2014-05-01 VITALS — Ht 67.0 in | Wt 117.8 lb

## 2014-05-01 DIAGNOSIS — E1165 Type 2 diabetes mellitus with hyperglycemia: Secondary | ICD-10-CM

## 2014-05-01 DIAGNOSIS — E119 Type 2 diabetes mellitus without complications: Secondary | ICD-10-CM | POA: Insufficient documentation

## 2014-05-01 DIAGNOSIS — E118 Type 2 diabetes mellitus with unspecified complications: Secondary | ICD-10-CM

## 2014-05-01 DIAGNOSIS — E0520 Thyrotoxicosis with toxic multinodular goiter without thyrotoxic crisis or storm: Secondary | ICD-10-CM

## 2014-05-01 DIAGNOSIS — Z713 Dietary counseling and surveillance: Secondary | ICD-10-CM | POA: Insufficient documentation

## 2014-05-01 DIAGNOSIS — IMO0002 Reserved for concepts with insufficient information to code with codable children: Secondary | ICD-10-CM

## 2014-05-01 NOTE — Patient Instructions (Signed)
Plan:  Aim for 2-3 Carb Choices per meal (30-45 grams) +/- 1 either way  Avoid Snacks. Cut out sweets, sodas and sweet tea Eat three meals per day about the same time every day. Focus on meal consistency of time. Do not skip meals. Include protein in moderation with your meals and snacks Consider  increasing your activity level by 30 minutes daily as tolerated Bake and broil foods. Increase fiber in diet as tolerated.

## 2014-05-01 NOTE — Progress Notes (Signed)
Diabetes Self-Management Education  Visit Type:    Appt. Start Time: 1330 Appt. End Time: 1500  05/01/2014  [name], identified by name and date of birth, is a 63 y.o. female with a diagnosis of Diabetes: Type 2.  Other people present during visit:  None   ASSESSMENT  Height 5\' 7"  (1.702 m), weight 117 lb 12.8 oz (53.434 kg). Body mass index is 18.45 kg/(m^2).  Initial Visit Information:  Are you currently following a meal plan?: No   Are you taking your medications as prescribed?: Not on Medications Are you checking your feet?: No   How often do you need to have someone help you when you read instructions, pamphlets, or other written materials from your doctor or pharmacy?: 1 - Never What is the last grade level you completed in school?: 11  Psychosocial:    Patient Belief/Attitude about Diabetes: Motivated to manage diabetes Self-care barriers: None Self-management support: Doctor's office, Friends, Family, CDE visits Other persons present: Patient Patient Concerns: Nutrition/Meal planning, Healthy Lifestyle Special Needs: None Preferred Learning Style: No preference indicated Learning Readiness: Ready  Complications:   Last HgB A1C per patient/outside source: 6.5 mg/dL How often do you check your blood sugar?: Not recommended by provider Have you had a dilated eye exam in the past 12 months?: Yes Have you had a dental exam in the past 12 months?: Yes  Diet Intake:  Breakfast: SomeTimes nothing.;  Cereal(multigrain cheerrios) or raisin bran with 2% Lactaid Lunch: sometimes skips; sub or salad, sweet tea but has cut that out recently or 50/50. Dinner: Ribs, baked potato; likes fish, vegetables, water Snack (evening): none; sometimes has desserts for Sunday church function Beverage(s): water, sweet tea, or occassionally soda Eating pattern in very inconsistent and she wants to improve it.  Exercise:  Exercise: Light (walking / raking leaves), Moderate  (swimming / aerobic walking) Light Exercise amount of time (min / week): 30  Individualized Plan for Diabetes Self-Management Training:   Learning Objective:  Patient will have a greater understanding of diabetes self-management.  Patient education plan per assessed needs and concerns is to attend individual sessions for   3 visits.  Education Topics Reviewed with Patient Today:  Definition of diabetes, type 1 and 2, and the diagnosis of diabetes, Factors that contribute to the development of diabetes, Explored patient's options for treatment of their diabetes Role of diet in the treatment of diabetes and the relationship between the three main macronutrients and blood glucose level, Carbohydrate counting, Reviewed blood glucose goals for pre and post meals and how to evaluate the patients' food intake on their blood glucose level., Meal timing in regards to the patients' current diabetes medication., Information on hints to eating out and maintain blood glucose control. Role of exercise on diabetes management, blood pressure control and cardiac health. Reviewed patients medication for diabetes, action, purpose, timing of dose and side effects.     Relationship between chronic complications and blood glucose control, Assessed and discussed foot care and prevention of foot problems, Identified and discussed with patient  current chronic complications, Dental care, Lipid levels, blood glucose control and heart disease Role of stress on diabetes, Worked with patient to identify barriers to care and solutions, Identified and addressed patients feelings and concerns about diabetes, Brainstormed with patient on coping mechanisms for social situations, getting support from significant others, dealing with feelings about diabetes   Lifestyle issues that need to be addressed for better diabetes care  PATIENTS GOALS/Plan (Developed by the patient):  Nutrition: Follow meal plan discussed  Plan:  Plan:   Aim for 2-3 Carb Choices per meal (30-45 grams) +/- 1 either way  Skip snacks unless feeling a low blood sugar. Include protein in moderation with your meals  Consider reading food labels for Total Carbohydrate and Fat Grams of foods Consider  increasing your activity level by 30 minutes daily as tolerated Goal: Eat three balanced meals per day about the same time. 2. Cut out sweet tea and concentrated sweets. 3. Reduce fat intake and bake and broil foods.   Expected Outcomes:    Improved eating habits and A1C less than 6% in three months without hypoglycemia episodes  Education material provided: Living Well with Diabetes, The Plate Method, Carb Meal Plan Card  If problems or questions, patient to contact team via:  Phone [phone]  Future DSME appointment:  6 weeks.

## 2014-05-19 ENCOUNTER — Ambulatory Visit (HOSPITAL_COMMUNITY)
Admission: RE | Admit: 2014-05-19 | Discharge: 2014-05-19 | Disposition: A | Discharge status: Home / Self Care | Payer: BC Managed Care – PPO | Source: Ambulatory Visit | Attending: "Endocrinology | Admitting: "Endocrinology

## 2014-05-19 DIAGNOSIS — E0520 Thyrotoxicosis with toxic multinodular goiter without thyrotoxic crisis or storm: Secondary | ICD-10-CM | POA: Diagnosis present

## 2014-05-30 ENCOUNTER — Other Ambulatory Visit: Payer: Self-pay | Admitting: Family Medicine

## 2014-06-14 ENCOUNTER — Encounter: Payer: BC Managed Care – PPO | Attending: "Endocrinology | Admitting: Nutrition

## 2014-06-14 VITALS — Ht 67.0 in | Wt 115.8 lb

## 2014-06-14 DIAGNOSIS — Z713 Dietary counseling and surveillance: Secondary | ICD-10-CM | POA: Diagnosis not present

## 2014-06-14 DIAGNOSIS — IMO0002 Reserved for concepts with insufficient information to code with codable children: Secondary | ICD-10-CM

## 2014-06-14 DIAGNOSIS — E1165 Type 2 diabetes mellitus with hyperglycemia: Secondary | ICD-10-CM

## 2014-06-14 DIAGNOSIS — E118 Type 2 diabetes mellitus with unspecified complications: Secondary | ICD-10-CM | POA: Diagnosis present

## 2014-06-14 NOTE — Progress Notes (Signed)
  Diabetes Self-Management Education  Visit Type:    Appt. Start Time: 1330 Appt. End Time: 1500  06/14/2014  [name], identified by name and date of birth, is a 63 y.o. female with a diagnosis of Diabetes:  .  Other people present during visit:  None   ASSESSMENT  Height 5\' 7"  (1.702 m), weight 115 lb 12.8 oz (52.527 kg). Body mass index is 18.13 kg/(m^2).  Initial Visit Information:  Follow up Visit. She feels she is eating some better.  Not testing blood sugars but trying to eat more consistent food choices and better planned meals.                 Psychosocial:  No problems     Reviewed how stress effects blood sugars and benefits of healthy lifestyle.    Complications:  None    Diet Intake:  She is working on eating better balanced meals and improving food choices to more fresh fruits, vegetables and cutting out the sodas, sweets and desserts.   Eating pattern in very inconsistent and she wants to improve it. B) 1 egg and 1 slice bacon L) Ham sandwich and carrots D) Meat and a vegetable. Drinks water. Diet is inconsistent in all food groups and insuffient of CHO and calories at meals creating unplanned weight loss. DIdn't feel confident about how many carbs to eat per meal.  Exercise:  Exercise: Light (walking / raking leaves), Moderate (swimming / aerobic walking) Light Exercise amount of time (min / week): 30  Individualized Plan for Diabetes Self-Management Training:   Learning Objective:  Patient will have a greater understanding of diabetes self-management.  Patient education plan per assessed needs and concerns is to attend individual sessions for   3 visits.  Education Topics Reviewed with Patient Today: Meal planning using My Plate and Meal Plan card with carb counting. Reviewed portion sizes, reading food labels, target ranges for bloods sugars and prevention of complications.                     PATIENTS GOALS/Plan (Developed by  the patient): 1. To get A1C below 6% in three months. 2. Prevent complications from DM. 3. Exercise 30 minutes 3-4 times per week. 4. Prevent weight loss and gain weight to 120 lbs to maintain.   Plan:  Plan:  Aim for 3 Carb Choices per meal (30-45 grams) +/- 1 either way  Skip snacks unless feeling a low blood sugar. Include protein in moderation with your meals  Consider reading food labels for Total Carbohydrate and Fat Grams of foods Consider  increasing your activity level by 30 minutes daily as tolerated Goal: Eat three balanced meals per day about the same time. 2. Get A1c TO 6% OR BELOW  Expected Outcomes:    Improved eating habits and A1C less than 6% in three months without hypoglycemia episodes  Education material provided:  The Plate Method, Carb Meal Plan Card and reading food labels  If problems or questions, patient to contact team via:  Phone [phone]  Future DSME appointment:  6 weeks.

## 2014-06-15 NOTE — Patient Instructions (Signed)
Plan:  Aim for 3 Carb Choices per meal (30-45 grams) +/- 1 either way  Skip snacks unless feeling a low blood sugar. Include protein in moderation with your meals  Consider reading food labels for Total Carbohydrate and Fat Grams of foods Consider  increasing your activity level by 30 minutes daily as tolerated Goal: Eat three balanced meals per day about the same time. 2. Get A1c TO 6% OR BELOW

## 2014-06-20 ENCOUNTER — Ambulatory Visit (HOSPITAL_COMMUNITY)
Admission: RE | Admit: 2014-06-20 | Discharge: 2014-06-20 | Disposition: A | Discharge status: Home / Self Care | Payer: BC Managed Care – PPO | Source: Ambulatory Visit | Attending: Family Medicine | Admitting: Family Medicine

## 2014-06-20 ENCOUNTER — Encounter: Payer: Self-pay | Admitting: Family Medicine

## 2014-06-20 ENCOUNTER — Ambulatory Visit (INDEPENDENT_AMBULATORY_CARE_PROVIDER_SITE_OTHER): Payer: BC Managed Care – PPO | Admitting: Family Medicine

## 2014-06-20 VITALS — BP 108/70 | HR 82 | Resp 16 | Ht 67.0 in | Wt 113.1 lb

## 2014-06-20 DIAGNOSIS — M545 Low back pain: Secondary | ICD-10-CM | POA: Diagnosis present

## 2014-06-20 DIAGNOSIS — F411 Generalized anxiety disorder: Secondary | ICD-10-CM

## 2014-06-20 DIAGNOSIS — E785 Hyperlipidemia, unspecified: Secondary | ICD-10-CM

## 2014-06-20 DIAGNOSIS — E119 Type 2 diabetes mellitus without complications: Secondary | ICD-10-CM

## 2014-06-20 DIAGNOSIS — M79672 Pain in left foot: Secondary | ICD-10-CM

## 2014-06-20 DIAGNOSIS — M79671 Pain in right foot: Secondary | ICD-10-CM

## 2014-06-20 DIAGNOSIS — Z23 Encounter for immunization: Secondary | ICD-10-CM

## 2014-06-20 NOTE — Patient Instructions (Addendum)
F/u in 4 month, call if you need me before  Prevnar today  Microalb today  Pls sign for  Eye exam to be sent today  Pls get xray of back today, if entirely normal , you will be referred to podiatry about your feet.  Fasting lipid, cmp and EGFR today  Unable to assess safety of ANY OTC  med other than a plain multivitamin when on prescription medciation  Colonoscopy next due in 2021

## 2014-06-20 NOTE — Assessment & Plan Note (Signed)
Vaccine administered at visit.  

## 2014-06-22 ENCOUNTER — Other Ambulatory Visit: Payer: Self-pay

## 2014-06-22 DIAGNOSIS — M79671 Pain in right foot: Secondary | ICD-10-CM

## 2014-06-22 DIAGNOSIS — M79672 Pain in left foot: Principal | ICD-10-CM

## 2014-06-22 LAB — MICROALBUMIN / CREATININE URINE RATIO
CREATININE, URINE: 165.6 mg/dL
MICROALB UR: 0.9 mg/dL (ref <2.0)
Microalb Creat Ratio: 5.4 mg/g (ref 0.0–30.0)

## 2014-06-24 ENCOUNTER — Other Ambulatory Visit: Payer: Self-pay | Admitting: Family Medicine

## 2014-06-26 NOTE — Assessment & Plan Note (Signed)
Increasingly disabling will refer to podiatry if no abnormality in c spine, foot exam today is non revealing

## 2014-06-26 NOTE — Assessment & Plan Note (Signed)
Followed by endo, pt on npo medcation currently

## 2014-06-26 NOTE — Progress Notes (Signed)
   Subjective:    Patient ID: Tamara Lam, female    DOB: 08-04-1950, 63 y.o.   MRN: [id number]  HPI The PT is here for follow up and re-evaluation of chronic medical conditions, medication management and review of any available recent lab and radiology data.  Preventive health is updated, specifically  Cancer screening and Immunization.   Questions or concerns regarding consultations or procedures which the PT has had in the interim are  Addressed.Seen regularly by endo, derm and opthalmology, generally doing well in all areas, states pressures in eyes is good  The PT stopped buspar, states it made her feel funny, has questions about the safety of an OTC med for anxiety, on rx meds I cannot verify this.  C/o increadsed bilateral foot pain , mainly on dorsum, no direct trauma    Review of Systems See HPI Denies recent fever or chills. Denies sinus pressure, nasal congestion, ear pain or sore throat. Denies chest congestion, productive cough or wheezing. Denies chest pains, palpitations and leg swelling Denies abdominal pain, nausea, vomiting,diarrhea or constipation.   Denies dysuria, frequency, hesitancy or incontinence. Denies headaches, seizures, numbness, or tingling.  Denies skin break down or rash.        Objective:   Physical Exam BP 108/70 mmHg  Pulse 82  Resp 16  Ht 5\' 7"  (1.702 m)  Wt 113 lb 1.9 oz (51.311 kg)  BMI 17.71 kg/m2  SpO2 100% Patient alert and oriented and in no cardiopulmonary distress.  HEENT: No facial asymmetry, EOMI,   oropharynx pink and moist.  Neck supple no JVD, no mass.  Chest: Clear to auscultation bilaterally.  CVS: S1, S2 no murmurs, no S3.Regular rate.  ABD: Soft non tender.   Ext: No edema  MS: Adequate ROM spine, shoulders, hips and knees.  Skin: Intact, no ulcerations or rash noted.  Psych: Good eye contact, normal affect. Memory intact not anxious or depressed appearing.  CNS: CN 2-12 intact, power,  normal  throughout.no focal deficits noted.        Assessment & Plan:  Need for vaccination with 13-polyvalent pneumococcal conjugate vaccine Vaccine administered at visit.   Hyperlipidemia LDL goal <100 Updated lab needed at/ before next visit. Hyperlipidemia:Low fat diet discussed and encouraged.    Generalized anxiety disorder Intolerant of buspar, will be maintained on anxiolytic as before, overall , improved  Foot pain, bilateral Increasingly disabling will refer to podiatry if no abnormality in c spine, foot exam today is non revealing  Type 2 diabetes mellitus with HbA1C goal below 7.5 Followed by endo, pt on npo medcation currently

## 2014-06-26 NOTE — Assessment & Plan Note (Signed)
Intolerant of buspar, will be maintained on anxiolytic as before, overall , improved

## 2014-06-26 NOTE — Assessment & Plan Note (Signed)
Updated lab needed at/ before next visit. Hyperlipidemia:Low fat diet discussed and encouraged.   

## 2014-07-07 LAB — HEMOGLOBIN A1C: A1c: 6

## 2014-07-12 ENCOUNTER — Encounter: Payer: Self-pay | Admitting: Podiatry

## 2014-07-12 ENCOUNTER — Ambulatory Visit (INDEPENDENT_AMBULATORY_CARE_PROVIDER_SITE_OTHER): Payer: BLUE CROSS/BLUE SHIELD

## 2014-07-12 ENCOUNTER — Ambulatory Visit (INDEPENDENT_AMBULATORY_CARE_PROVIDER_SITE_OTHER): Payer: BLUE CROSS/BLUE SHIELD | Admitting: Podiatry

## 2014-07-12 VITALS — BP 117/80 | HR 74 | Resp 18

## 2014-07-12 DIAGNOSIS — R52 Pain, unspecified: Secondary | ICD-10-CM

## 2014-07-12 DIAGNOSIS — M7662 Achilles tendinitis, left leg: Secondary | ICD-10-CM

## 2014-07-12 DIAGNOSIS — M7670 Peroneal tendinitis, unspecified leg: Secondary | ICD-10-CM | POA: Diagnosis not present

## 2014-07-12 DIAGNOSIS — M7661 Achilles tendinitis, right leg: Secondary | ICD-10-CM | POA: Diagnosis not present

## 2014-07-12 DIAGNOSIS — M129 Arthropathy, unspecified: Secondary | ICD-10-CM

## 2014-07-12 DIAGNOSIS — M19079 Primary osteoarthritis, unspecified ankle and foot: Secondary | ICD-10-CM

## 2014-07-12 NOTE — Patient Instructions (Signed)
Achilles Tendinitis Achilles tendinitis is inflammation of the tough, cord-like band that attaches the lower muscles of your leg to your heel (Achilles tendon). It is usually caused by overusing the tendon and joint involved.  CAUSES Achilles tendinitis can happen because of:  A sudden increase in exercise or activity (such as running).  Doing the same exercises or activities (such as jumping) over and over.  Not warming up calf muscles before exercising.  Exercising in shoes that are worn out or not made for exercise.  Having arthritis or a bone growth on the back of the heel bone. This can rub against the tendon and hurt the tendon. SIGNS AND SYMPTOMS The most common symptoms are:  Pain in the back of the leg, just above the heel. The pain usually gets worse with exercise and better with rest.  Stiffness or soreness in the back of the leg, especially in the morning.  Swelling of the skin over the Achilles tendon.  Trouble standing on tiptoe. Sometimes, an Achilles tendon tears (ruptures). Symptoms of an Achilles tendon rupture can include:  Sudden, severe pain in the back of the leg.  Trouble putting weight on the foot or walking normally. DIAGNOSIS Achilles tendinitis will be diagnosed based on symptoms and a physical examination. An X-ray may be done to check if another condition is causing your symptoms. An MRI may be ordered if your health care provider suspects you may have completely torn your tendon, which is called an Achilles tendon rupture.  TREATMENT  Achilles tendinitis usually gets better over time. It can take weeks to months to heal completely. Treatment focuses on treating the symptoms and helping the injury heal. HOME CARE INSTRUCTIONS   Rest your Achilles tendon and avoid activities that cause pain.  Apply ice to the injured area:  Put ice in a plastic bag.  Place a towel between your skin and the bag.  Leave the ice on for 20 minutes, 2-3 times a  day  Try to avoid using the tendon (other than gentle range of motion) while the tendon is painful. Do not resume use until instructed by your health care provider. Then begin use gradually. Do not increase use to the point of pain. If pain does develop, decrease use and continue the above measures. Gradually increase activities that do not cause discomfort until you achieve normal use.  Do exercises to make your calf muscles stronger and more flexible. Your health care provider or physical therapist can recommend exercises for you to do.  Wrap your ankle with an elastic bandage or other wrap. This can help keep your tendon from moving too much. Your health care provider will show you how to wrap your ankle correctly.  Only take over-the-counter or prescription medicines for pain, discomfort, or fever as directed by your health care provider. SEEK MEDICAL CARE IF:   Your pain and swelling increase or pain is uncontrolled with medicines.  You develop new, unexplained symptoms or your symptoms get worse.  You are unable to move your toes or foot.  You develop warmth and swelling in your foot.  You have an unexplained temperature. MAKE SURE YOU:   Understand these instructions.  Will watch your condition.  Will get help right away if you are not doing well or get worse. Document Released: 03/26/2005 Document Revised: 04/06/2013 Document Reviewed: 01/26/2013 [employer] Patient Information 2015 [employer]. This information is not intended to replace advice given to you by your health care provider. Make sure you discuss   any questions you have with your health care provider. Peroneal Tendinitis with Rehab Tendonitis is inflammation of a tendon. Inflammation of the tendons on the back of the outer ankle (peroneal tendons) is known as peroneal tendonitis. The peroneal tendons are responsible for connecting the muscles that allow you to stand on your tiptoes to the bones of the ankle. For  this reason, peroneal tendonitis often causes pain when trying to complete such motions. Peroneal tendonitis often involves a tear (strain) of the peroneal tendons. Strains are classified into three categories. Grade 1 strains cause pain, but the tendon is not lengthened. Grade 2 strains include a lengthened ligament, due to the ligament being stretched or partially ruptured. With grade 2 strains there is still function, although function may be decreased. Grade 3 strains involve a complete tear of the tendon or muscle, and function is usually impaired. SYMPTOMS   Pain, tenderness, swelling, warmth, or redness over the back of the outer side of the ankle, the outer part of the mid-foot, or the bottom of the arch.  Pain that gets worse with ankle motion (especially when pushing off or pushing down with the front of the foot), or when standing on the ball of the foot or pushing the foot outward.  Crackling sound (crepitation) when the tendon is moved or touched. CAUSES  Peroneal tendinitis occurs when injury to the peroneal tendons causes the body to respond with inflammation. Common causes of injury include:  An overuse injury, in which the groove behind the outer ankle (where the tendon is located) causes wear on the tendon.  A sudden stress placed on the tendon, such as from an increase in the intensity, frequency, or duration of training.  Direct hit (trauma) to the tendon.  Return to activity too soon after a previous ankle injury. RISK INCREASES WITH:  Sports that require sudden, repetitive pushing off of the foot, such as jumping or quick starts.  Kicking and running sports, especially running down hills or long distances.  Poor strength and flexibility.  Previous injury to the foot, ankle, or leg. PREVENTION  Warm up and stretch properly before activity.  Allow for adequate recovery between workouts.  Maintain physical fitness:  Strength, flexibility, and  endurance.  Cardiovascular fitness.  Complete rehabilitation after previous injury. PROGNOSIS  If treated properly, peroneal tendonitis usually heals within 6 weeks.  RELATED COMPLICATIONS  Longer healing time, if not properly treated or if not given enough time to heal.  Recurring symptoms if activity is resumed too soon, with overuse, or when using poor technique.  If untreated, tendinitis may result in tendon rupture, requiring surgery. TREATMENT  Treatment first involves the use of ice and medicine to reduce pain and inflammation. The use of strengthening and stretching exercises may help reduce pain with activity. These exercises may be performed at home or with a therapist. Sometimes, the foot and ankle will be restrained for 10 to 14 days to promote healing. Your caregiver may advise that you place a heel lift in your shoes to reduce the stress placed on the tendon. If nonsurgical treatment is unsuccessful, surgery to remove the inflamed tendon lining (sheath) may be advised.  MEDICATION   If pain medicine is needed, nonsteroidal anti-inflammatory medicines (aspirin and ibuprofen), or other minor pain relievers (acetaminophen), are often advised.  Do not take pain medicine for 7 days before surgery.  Prescription pain relievers may be given, if your caregiver thinks they are needed. Use only as directed and only as much as  you need. HEAT AND COLD  Cold treatment (icing) should be applied for 10 to 15 minutes every 2 to 3 hours for inflammation and pain, and immediately after activity that aggravates your symptoms. Use ice packs or an ice massage.  Heat treatment may be used before performing stretching and strengthening activities prescribed by your caregiver, physical therapist, or athletic trainer. Use a heat pack or a warm water soak. SEEK MEDICAL CARE IF:  Symptoms get worse or do not improve in 2 to 4 weeks, despite treatment.  New, unexplained symptoms develop. (Drugs  used in treatment may produce side effects.) EXERCISES RANGE OF MOTION (ROM) AND STRETCHING EXERCISES - Peroneal Tendinitis These exercises may help you when beginning to rehabilitate your injury. Your symptoms may resolve with or without further involvement from your physician, physical therapist or athletic trainer. While completing these exercises, remember:   Restoring tissue flexibility helps normal motion to return to the joints. This allows healthier, less painful movement and activity.  An effective stretch should be held for at least 30 seconds.  A stretch should never be painful. You should only feel a gentle lengthening or release in the stretched tissue. RANGE OF MOTION - Ankle Eversion  Sit with your right / left ankle crossed over your opposite knee.  Grip your foot with your opposite hand, placing your thumb on the top of your foot and your fingers across the bottom of your foot.  Gently push your foot downward with a slight rotation, so your littlest toes rise slightly toward the ceiling.  You should feel a gentle stretch on the inside of your ankle. Hold the stretch for __________ seconds. Repeat __________ times. Complete this exercise __________ times per day.  RANGE OF MOTION - Ankle Inversion  Sit with your right / left ankle crossed over your opposite knee.  Grip your foot with your opposite hand, placing your thumb on the bottom of your foot and your fingers across the top of your foot.  Gently pull your foot so the smallest toe comes toward you and your thumb pushes the inside of the ball of your foot away from you.  You should feel a gentle stretch on the outside of your ankle. Hold the stretch for __________ seconds. Repeat __________ times. Complete this exercise __________ times per day.  RANGE OF MOTION - Ankle Plantar Flexion  Sit with your right / left leg crossed over your opposite knee.  Use your opposite hand to pull the top of your foot and toes  toward you.  You should feel a gentle stretch on the top of your foot and ankle. Hold this position for __________ seconds. Repeat __________ times. Complete __________ times per day.  STRETCH - Gastroc, Standing  Place your hands on a wall.  Extend your right / left leg behind you, keeping the front knee somewhat bent.  Slightly point your toes inward on your back foot.  Keeping your right / left heel on the floor and your knee straight, shift your weight toward the wall, not allowing your back to arch.  You should feel a gentle stretch in the calf. Hold this position for __________ seconds. Repeat __________ times. Complete this stretch __________ times per day. STRETCH - Soleus, Standing  Place your hands on a wall.  Extend your right / left leg behind you, keeping the other knee somewhat bent.  Slightly point your toes inward on your back foot.  Keep your heel on the floor, bend your back knee, and  slightly shift your weight over the back leg so that you feel a gentle stretch deep in your back calf.  Hold this position for __________ seconds. Repeat __________ times. Complete this stretch __________ times per day. STRETCH - Gastrocsoleus, Standing Note: This exercise can place a lot of stress on your foot and ankle. Please complete this exercise only if specifically instructed by your caregiver.   Place the ball of your right / left foot on a step, keeping your other foot firmly on the same step.  Hold on to the wall or a rail for balance.  Slowly lift your other foot, allowing your body weight to press your heel down over the edge of the step.  You should feel a stretch in your right / left calf.  Hold this position for __________ seconds.  Repeat this exercise with a slight bend in your knee. Repeat __________ times. Complete this stretch __________ times per day.  STRENGTHENING EXERCISES - Peroneal Tendinitis  These exercises may help you when beginning to  rehabilitate your injury. They may resolve your symptoms with or without further involvement from your physician, physical therapist or athletic trainer. While completing these exercises, remember:   Muscles can gain both the endurance and the strength needed for everyday activities through controlled exercises.  Complete these exercises as instructed by your physician, physical therapist or athletic trainer. Increase the resistance and repetitions only as guided by your caregiver. STRENGTH - Dorsiflexors  Secure a rubber exercise band or tubing to a fixed object (table, pole) and loop the other end around your right / left foot.  Sit on the floor facing the fixed object. The band should be slightly tense when your foot is relaxed.  Slowly draw your foot back toward you, using your ankle and toes.  Hold this position for __________ seconds. Slowly release the tension in the band and return your foot to the starting position. Repeat __________ times. Complete this exercise __________ times per day.  STRENGTH - Towel Curls  Sit in a chair, on a non-carpeted surface.  Place your foot on a towel, keeping your heel on the floor.  Pull the towel toward your heel only by curling your toes. Keep your heel on the floor.  If instructed by your physician, physical therapist or athletic trainer, add weight to the end of the towel. Repeat __________ times. Complete this exercise __________ times per day. STRENGTH - Ankle Eversion   Secure one end of a rubber exercise band or tubing to a fixed object (table, pole). Loop the other end around your foot, just before your toes.  Place your fists between your knees. This will focus your strengthening at your ankle.  Drawing the band across your opposite foot, away from the pole, slowly, pull your little toe out and up. Make sure the band is positioned to resist the entire motion.  Hold this position for __________ seconds.  Have your muscles resist  the band, as it slowly pulls your foot back to the starting position. Repeat __________ times. Complete this exercise __________ times per day.  Document Released: 06/16/2005 Document Revised: 10/31/2013 Document Reviewed: 09/28/2008 [facility] Patient Information 2015 [place], [place]. This information is not intended to replace advice given to you by your health care provider. Make sure you discuss any questions you have with your health care provider.

## 2014-07-12 NOTE — Progress Notes (Signed)
   Subjective:    Patient ID: Tamara Lam, female    DOB: [birth year], 64 y.o.   MRN: [id number]  HPI  64 year old female presents the office today with complaints of bilateral foot pain. She states that she has pain on the top of her foot as well as into the heel area. She states is in ongoing for approximate one year and has been intermittent in nature. She denies any history of injury or trauma to bilateral lower extremities. She denies any recent swelling or redness overlying the area. She states that she has pain particularly with prolonged ambulation however the discomfort is not limited her activity. No other complaints at this time. She states that she is diabetic and her blood sugar is controlled.     Review of Systems  Constitutional:       NIGHT SWEATS  Eyes:       GLAUCOMA  Hematological: Bruises/bleeds easily.  All other systems reviewed and are negative.      Objective:   Physical Exam AAO 3, NAD DP/PT pulses palpable bilaterally, CRT less than 3 seconds Protective sensation intact with Simms Weinstein monofilament, vibratory sensation intact, Achilles tendon reflex intact. There is mild discomfort on the posterior aspect of bilateral heels at the insertion of the Achilles tendon. There is no pain on the mid substance of the Achilles tendon and the tendons are intact. There is no pain along the plantar medial tubercle of the calcaneus at the insertion of the plantar fascia. There is no pain with lateral compression of the calcaneus or pain the vibratory sensation bilaterally. There is mild discomfort along the course of the peroneal tendons along its insertion of the fifth metatarsal base and inferior to the lateral malleolus. There is no discomfort with range of motion. There is mild discomfort along the dorsal aspect of bilateral midfoot with a slight exostosis palpable. There is no overlying edema, erythema, increase in warmth to bilateral lower extremities. No open  lesions or pre-ulcerative lesions identified. No pain with calf compression, swelling, warmth, erythema.       Assessment & Plan:  64 year old female with insertional Achilles tendinitis, peroneal tendinitis, midfoot arthritic changes. -X-rays were obtained and reviewed with the patient. -Treatment options were discussed including alternatives, risks, complications. -Discussed stretching exercises for both the peroneal tendinitis as well as the Achilles tendinitis. Also, ice to the area.  -Discussed shoe gear modifications and possible orthotics to help the midfoot discomfort to help her foot from collapsing and also to help the Achilles tendinitis. -Follow-up in 4 weeks or sooner should any problems arise. In the meantime, encouraged to call the office with any questions, concerns, change in symptoms. Follow-up with PCP for other issues mentioned in the ROS.

## 2014-08-09 ENCOUNTER — Ambulatory Visit: Payer: BLUE CROSS/BLUE SHIELD | Admitting: Podiatry

## 2014-08-30 ENCOUNTER — Other Ambulatory Visit: Payer: Self-pay

## 2014-08-30 DIAGNOSIS — Z1231 Encounter for screening mammogram for malignant neoplasm of breast: Secondary | ICD-10-CM

## 2014-09-13 ENCOUNTER — Encounter: Payer: Self-pay | Admitting: Nutrition

## 2014-09-13 ENCOUNTER — Encounter: Payer: 59 | Attending: "Endocrinology | Admitting: Nutrition

## 2014-09-13 VITALS — Ht 67.0 in | Wt 115.9 lb

## 2014-09-13 DIAGNOSIS — Z713 Dietary counseling and surveillance: Secondary | ICD-10-CM | POA: Insufficient documentation

## 2014-09-13 DIAGNOSIS — E1165 Type 2 diabetes mellitus with hyperglycemia: Secondary | ICD-10-CM

## 2014-09-13 DIAGNOSIS — E118 Type 2 diabetes mellitus with unspecified complications: Secondary | ICD-10-CM | POA: Diagnosis present

## 2014-09-13 DIAGNOSIS — IMO0002 Reserved for concepts with insufficient information to code with codable children: Secondary | ICD-10-CM

## 2014-09-13 NOTE — Patient Instructions (Signed)
Goals:  Follow Diabetes Meal Plan as instructed  Eat 3 meals   Limit carbohydrate intake to 30-45 grams carbohydrate/meal  Avoid snacks between meals.  Add lean protein foods to meals/snacks  Don't skip meals.  Drink Glucerna and fruit/cheese or PB if needed for lunch meal.  Aim for 30 mins of physical activity daily  Avoid low blood sugars  Gain 1 lb per month til next visit.

## 2014-09-13 NOTE — Progress Notes (Signed)
  Medical Nutrition Therapy:  Appt start time: 1400 end time:  1430.  Assessment:  Primary concerns today: F/u DM  She notes she is still missing some lunches during the day. Doesn't have an appetite for much food during the day. Weight is down from usual weight 118-120 lbs. Cares for her aging parents and stays on the go a lot.  Attends the [employer] program at the [facility] a few times per week. Lost her husband a few years ago and lives by herself. Doesn't cook a lot.   Complains of instant hot flashes at times with slight nausea. Discussed possible low blood sugar episode and would need to eat and or check blood sugar. Not checking blood sugars since A1C was 6.0% last visit she states.    MEDICATIONS: See list  DIETARY INTAKE:  24-hr recall:  B) BoiIled egg, toast, OR cereal with milk or nutritigrain bar L)  PB and banana on 2 slices bread; occasionally has fruit cup or a piece of fruit. water D) Baked chicken, peas, greens, water Doesn't really snacks between meals. Has cut out higher fat and other snacks to help improve blood sugar but has caused her weight to drop. Willing to try to drink Glucerna and fruit or yogurt as a meal replacement for lunch or TV dinner-healthy choice or something since she doesn't cook much for herself. Drinks mostly water  Recent physical activity: walks and does the [employer] at [employer].  Estimated energy needs: 1500 calories 170 g carbohydrates 112 g protein 42 g fat  Progress Towards Goal(s):  In progress.   Nutritional Diagnosis:  NB-1.1 Food and nutrition-related knowledge deficit As related to Diabetes.  As evidenced by A1C 6.5% in the past..    Intervention:  Nutrition counseling and diabetes education provided on meal planning, portion sizes, signs and symptoms of hypo/hyperglycemia and importance of adequate calories, protein and fat at meals for nutritional needs. Stressed need for three balanced meals per day.  Goals:  Follow  Diabetes Meal Plan as instructed  Eat 3 meals   Limit carbohydrate intake to 30-45 grams carbohydrate/meal  Avoid snacks between meals.  Add lean protein foods to meals/snacks  Don't skip meals.  Drink Glucerna and fruit/cheese or PB if needed for lunch meal.  Aim for 30 mins of physical activity daily  Avoid low blood sugars  Gain 1 lb per month til next visit.  Handouts given during visit include: The Plate Method Meal Plan Card  Monitoring/Evaluation:  Dietary intake, exercise, meal planning, and body weight in 3 months.

## 2014-09-28 ENCOUNTER — Ambulatory Visit
Admission: RE | Admit: 2014-09-28 | Discharge: 2014-09-28 | Disposition: A | Discharge status: Home / Self Care | Payer: BLUE CROSS/BLUE SHIELD | Source: Ambulatory Visit

## 2014-09-28 DIAGNOSIS — Z1231 Encounter for screening mammogram for malignant neoplasm of breast: Secondary | ICD-10-CM

## 2014-10-10 LAB — LIPID PANEL
CHOLESTEROL: 162 mg/dL (ref 0–200)
HDL: 67 mg/dL (ref >=46)
LDL CALC: 80 mg/dL (ref 0–99)
TRIGLYCERIDES: 74 mg/dL (ref <150)
Total CHOL/HDL Ratio: 2.4 Ratio
VLDL: 15 mg/dL (ref 0–40)

## 2014-10-10 LAB — COMPLETE METABOLIC PANEL WITH GFR
ALK PHOS: 34 U/L — AB (ref 39–117)
ALT: 16 U/L (ref 0–35)
AST: 22 U/L (ref 0–37)
Albumin: 4.4 g/dL (ref 3.5–5.2)
BUN: 13 mg/dL (ref 6–23)
CHLORIDE: 105 meq/L (ref 96–112)
CO2: 31 meq/L (ref 19–32)
CREATININE: 0.65 mg/dL (ref 0.50–1.10)
Calcium: 9.2 mg/dL (ref 8.4–10.5)
GFR, Est African American: >89 mL/min
GFR, Est Non African American: >89 mL/min
Glucose, Bld: 76 mg/dL (ref 70–99)
Potassium: 4.2 mEq/L (ref 3.5–5.3)
Sodium: 143 mEq/L (ref 135–145)
Total Bilirubin: 0.5 mg/dL (ref 0.2–1.2)
Total Protein: 6.8 g/dL (ref 6.0–8.3)

## 2014-10-19 ENCOUNTER — Encounter: Payer: Self-pay | Admitting: Family Medicine

## 2014-10-19 ENCOUNTER — Ambulatory Visit (INDEPENDENT_AMBULATORY_CARE_PROVIDER_SITE_OTHER): Payer: BLUE CROSS/BLUE SHIELD | Admitting: Family Medicine

## 2014-10-19 ENCOUNTER — Ambulatory Visit (HOSPITAL_COMMUNITY)
Admission: RE | Admit: 2014-10-19 | Discharge: 2014-10-19 | Disposition: A | Discharge status: Home / Self Care | Payer: BLUE CROSS/BLUE SHIELD | Source: Ambulatory Visit | Attending: Family Medicine | Admitting: Family Medicine

## 2014-10-19 VITALS — BP 122/76 | HR 80 | Resp 18 | Ht 67.0 in | Wt 117.0 lb

## 2014-10-19 DIAGNOSIS — R05 Cough: Secondary | ICD-10-CM | POA: Insufficient documentation

## 2014-10-19 DIAGNOSIS — F411 Generalized anxiety disorder: Secondary | ICD-10-CM | POA: Diagnosis not present

## 2014-10-19 DIAGNOSIS — R053 Chronic cough: Secondary | ICD-10-CM

## 2014-10-19 DIAGNOSIS — E785 Hyperlipidemia, unspecified: Secondary | ICD-10-CM

## 2014-10-19 DIAGNOSIS — B369 Superficial mycosis, unspecified: Secondary | ICD-10-CM | POA: Insufficient documentation

## 2014-10-19 DIAGNOSIS — R7989 Other specified abnormal findings of blood chemistry: Secondary | ICD-10-CM

## 2014-10-19 DIAGNOSIS — J449 Chronic obstructive pulmonary disease, unspecified: Secondary | ICD-10-CM | POA: Insufficient documentation

## 2014-10-19 DIAGNOSIS — K219 Gastro-esophageal reflux disease without esophagitis: Secondary | ICD-10-CM

## 2014-10-19 DIAGNOSIS — M1711 Unilateral primary osteoarthritis, right knee: Secondary | ICD-10-CM

## 2014-10-19 MED ORDER — TERBINAFINE HCL 250 MG PO TABS: 250.0000 mg | ORAL_TABLET | Freq: Every day | ORAL | Status: DC

## 2014-10-19 NOTE — Patient Instructions (Addendum)
Annual physical exam in 5 month, call if you need me before   Use antifungal tablet  for 1 week to rash on right breast if no better / still there call so I ask  A breast Doctor to check this     Labs are excellent ,   CXR today for chronic cough which is dry, and once the report is in we will call and ask you start a reflux medication if it clear  All the best with right knee  Natural management of hot fl;ashes Thanks for choosing [facility], we consider it a privelige to serve you.

## 2014-10-19 NOTE — Progress Notes (Signed)
Subjective:    Patient ID: Tamara Lam, female    DOB: 1950-07-08, 64 y.o.   MRN: [id number]  HPI   Tamara Lam     MRN: [id number]      DOB: [birth year]   HPI Tamara Lam is here for follow up and re-evaluation of chronic medical conditions, medication management and review of any available recent lab and radiology data.  Preventive health is updated, specifically  Cancer screening and Immunization.   Questions or concerns regarding consultations or procedures which the PT has had in the interim are  addressed. The PT denies any adverse reactions to current medications since the last visit.  C/o chronic dry cough which is disturbing C/o rash on right breast  In past 4 to 6 weeks ROS Denies recent fever or chills. Denies sinus pressure, nasal congestion, ear pain or sore throat. Denies chest congestion, productive cough or wheezing. Denies chest pains, palpitations and leg swelling Denies abdominal pain, nausea, vomiting,diarrhea or constipation.   Denies dysuria, frequency, hesitancy or incontinence. Denies headaches, seizures, numbness, or tingling. Denies  Uncontrolled , anxiety or insomnia.Denies depression    PE  BP 122/76 mmHg  Pulse 80  Resp 18  Ht 5\' 7"  (1.702 m)  Wt 117 lb 0.6 oz (53.089 kg)  BMI 18.33 kg/m2  SpO2 98%  Patient alert and oriented and in no cardiopulmonary distress.  HEENT: No facial asymmetry, EOMI,   oropharynx pink and moist.  Neck supple no JVD, no mass.  Chest: Clear to auscultation bilaterally.  CVS: S1, S2 no murmurs, no S3.Regular rate.  ABD: Soft non tender.   Ext: No edema  MS: Adequate ROM spine, shoulders, hips and reduced in right knee  Skin: Intact, fungal hyperpigmented rash on right breast max diameter approx 2 cm at 3 o clock position  Psych: Good eye contact, normal affect. Memory intact not anxious or depressed appearing.  CNS: CN 2-12 intact, power,  normal throughout.no focal deficits noted.   Assessment &  Plan   Dermatomycosis Short course of antifungal prescribed for rash on right breast , pt to call back if persists for evaluation by breast surgeon   Hyperlipidemia LDL goal <100 Controlled, no change in medication Hyperlipidemia:Low fat diet discussed and encouraged.   Lipid Panel  Lab Results  Component Value Date   CHOL 162 10/09/2014   HDL 67 10/09/2014   LDLCALC 80 10/09/2014   TRIG 74 10/09/2014   CHOLHDL 2.4 10/09/2014         Generalized anxiety disorder Improved, continue current management   Abnormal TSH Followed by endo   Chronic cough Non productive x months, CXr prior to definitive management strategy, history inconsistent with PND , may well be due to reflux   GERD (gastroesophageal reflux disease) Reports tht she is asymptomatic and takes no meds regualrly   Degenerative arthritis of knee Marked deterioration with instability and limitation in mobility, has upcoming appt with ortho   Type 2 diabetes mellitus with HbA1C goal below 7.5 Patient educated about the importance of limiting  Carbohydrate intake , the need to commit to daily physical activity for a minimum of 30 minutes , and to commit weight loss. The fact that changes in all these areas will reduce or eliminate all together the development of diabetes is stressed.   Diabetic Labs Latest Ref Rng 10/09/2014 06/20/2014 08/18/2013 08/17/2013 08/16/2013  Microalbumin <2.0 mg/dL - 0.9 - - -  Micro/Creat Ratio 0.0 - 30.0 mg/g - 5.4 - - -  Chol 0 - 200 mg/dL 162 - - - -  HDL >=46 mg/dL 67 - - - -  Calc LDL 0 - 99 mg/dL 80 - - - -  Triglycerides <150 mg/dL 74 - - - -  Creatinine 0.50 - 1.10 mg/dL 0.65 - 0.60 0.61 0.71   BP/Weight 10/19/2014 09/13/2014 07/12/2014 06/20/2014 06/14/2014 05/01/2014 3/52/4818  Systolic BP 590 - 931 121 - - 624  Diastolic BP 76 - 80 70 - - 84  Wt. (Lbs) 117.04 115.9 - 113.12 115.8 117.8 117.12  BMI 18.33 18.15 - 17.71 18.13 18.45 18.62   Foot/eye exam completion  dates Latest Ref Rng 06/20/2014 04/11/2014  Eye Exam No Retinopathy - No Retinopathy  Foot Form Completion - Done -             Review of Systems     Objective:   Physical Exam        Assessment & Plan:

## 2014-10-22 DIAGNOSIS — M179 Osteoarthritis of knee, unspecified: Secondary | ICD-10-CM | POA: Insufficient documentation

## 2014-10-22 DIAGNOSIS — M1710 Unilateral primary osteoarthritis, unspecified knee: Secondary | ICD-10-CM | POA: Insufficient documentation

## 2014-10-22 NOTE — Assessment & Plan Note (Signed)
Patient educated about the importance of limiting  Carbohydrate intake , the need to commit to daily physical activity for a minimum of 30 minutes , and to commit weight loss. The fact that changes in all these areas will reduce or eliminate all together the development of diabetes is stressed.   Diabetic Labs Latest Ref Rng 10/09/2014 06/20/2014 08/18/2013 08/17/2013 08/16/2013  Microalbumin <2.0 mg/dL - 0.9 - - -  Micro/Creat Ratio 0.0 - 30.0 mg/g - 5.4 - - -  Chol 0 - 200 mg/dL 162 - - - -  HDL >=46 mg/dL 67 - - - -  Calc LDL 0 - 99 mg/dL 80 - - - -  Triglycerides <150 mg/dL 74 - - - -  Creatinine 0.50 - 1.10 mg/dL 0.65 - 0.60 0.61 0.71   BP/Weight 10/19/2014 09/13/2014 07/12/2014 06/20/2014 06/14/2014 05/01/2014 07/30/8655  Systolic BP 846 - 962 952 - - 841  Diastolic BP 76 - 80 70 - - 84  Wt. (Lbs) 117.04 115.9 - 113.12 115.8 117.8 117.12  BMI 18.33 18.15 - 17.71 18.13 18.45 18.62   Foot/eye exam completion dates Latest Ref Rng 06/20/2014 04/11/2014  Eye Exam No Retinopathy - No Retinopathy  Foot Form Completion - Done -

## 2014-10-22 NOTE — Assessment & Plan Note (Signed)
Improved, continue current management

## 2014-10-22 NOTE — Assessment & Plan Note (Signed)
Non productive x months, CXr prior to definitive management strategy, history inconsistent with PND , may well be due to reflux

## 2014-10-22 NOTE — Assessment & Plan Note (Signed)
Controlled, no change in medication Hyperlipidemia:Low fat diet discussed and encouraged.   Lipid Panel  Lab Results  Component Value Date   CHOL 162 10/09/2014   HDL 67 10/09/2014   LDLCALC 80 10/09/2014   TRIG 74 10/09/2014   CHOLHDL 2.4 10/09/2014

## 2014-10-22 NOTE — Assessment & Plan Note (Signed)
Short course of antifungal prescribed for rash on right breast , pt to call back if persists for evaluation by breast surgeon

## 2014-10-22 NOTE — Assessment & Plan Note (Signed)
Reports tht she is asymptomatic and takes no meds regualrly

## 2014-10-22 NOTE — Assessment & Plan Note (Signed)
Marked deterioration with instability and limitation in mobility, has upcoming appt with ortho

## 2014-10-22 NOTE — Assessment & Plan Note (Signed)
Followed by endo.  

## 2014-10-26 ENCOUNTER — Encounter: Payer: Self-pay | Admitting: Internal Medicine

## 2014-11-21 ENCOUNTER — Other Ambulatory Visit: Payer: Self-pay | Admitting: Family Medicine

## 2014-12-20 LAB — HEMOGLOBIN A1C: HEMOGLOBIN A1C: 5.9 % (ref 4.0–6.0)

## 2014-12-20 LAB — TSH: TSH: 1.05 u[IU]/mL (ref <=5.90)

## 2014-12-27 ENCOUNTER — Ambulatory Visit: Payer: Self-pay | Admitting: Nutrition

## 2015-01-02 ENCOUNTER — Other Ambulatory Visit: Payer: Self-pay | Admitting: Family Medicine

## 2015-02-05 ENCOUNTER — Encounter: Payer: Self-pay | Admitting: Gastroenterology

## 2015-02-05 ENCOUNTER — Other Ambulatory Visit: Payer: Self-pay

## 2015-02-05 ENCOUNTER — Ambulatory Visit (INDEPENDENT_AMBULATORY_CARE_PROVIDER_SITE_OTHER): Payer: BLUE CROSS/BLUE SHIELD | Admitting: Gastroenterology

## 2015-02-05 VITALS — BP 122/71 | HR 87 | Temp 98.2°F | Ht 67.0 in | Wt 116.0 lb

## 2015-02-05 DIAGNOSIS — R634 Abnormal weight loss: Secondary | ICD-10-CM

## 2015-02-05 DIAGNOSIS — Z86010 Personal history of colonic polyps: Secondary | ICD-10-CM | POA: Insufficient documentation

## 2015-02-05 DIAGNOSIS — K5909 Other constipation: Secondary | ICD-10-CM | POA: Diagnosis not present

## 2015-02-05 MED ORDER — LUBIPROSTONE 24 MCG PO CAPS: 24.0000 ug | ORAL_CAPSULE | Freq: Two times a day (BID) | ORAL | Status: DC

## 2015-02-05 MED ORDER — PEG 3350-KCL-NA BICARB-NACL 420 G PO SOLR: 4000.0000 mL | Freq: Once | ORAL | Status: DC

## 2015-02-05 NOTE — Patient Instructions (Signed)
We have scheduled you for a colonoscopy with Dr. Gala Romney.  For constipation: you may take Amitiza 24 mcg twice a day with food to avoid nausea.   I would like to see you in 6 months to make sure your weight is remaining stable.

## 2015-02-05 NOTE — Progress Notes (Signed)
Primary Care Physician:  Tula Nakayama, MD Primary Gastroenterologist:  Dr. Gala Romney   Chief Complaint  Patient presents with  . Follow-up    having some constipation    HPI:   Tamara Lam is a 64 y.o. female presenting today due to need for updated colonoscopy. Last procedure in 2011 with anal canal/internal hemorrhoids, diminutive rectosigmoid polyp with path showing polypoid rectal mucosa. She has multiple family members with history of colon polyps.   Lost husband in 2012. Has weighed in the mid 120s in the past. States after losing her husband has been unable to pick up weight. Has history of thyroid issues and followed by Dr. Dorris Fetch. After diagnosis with diabetes, has been watching weight. Now staying around 1teens. No rectal bleeding. Sometimes bowel movements do not feel productive, despite stool softeners. Historically, 24 mcg has worked best in the past. Has intermittent cough. PCP raised question of reflux. CXR showed mild changes of COPD with mild progression. No dysphagia. No reflux although she did have one isolated incidence of nocturnal reflux. Doesn't care much about eating 3 meals a day. Has had long history of not wanting to eat.   Past Medical History  Diagnosis Date  . Anxiety 1980's   . Glaucoma 2004    Dr. Venetia Maxon, laser to left eye Sept 2011  . Weight loss   . Chronic nausea   . Hematochezia   . Chronic constipation   . Abnormal celiac antibody panel   . Hx of colonoscopy 08/2005    Dr. Gala Romney / hemorrhoids   . Helicobacter pylori gastritis 04/2004    last EGD   . Diabetes mellitus without complication   . Thyroid disease     Past Surgical History  Procedure Laterality Date  . Cholecystectomy  1990's  . Vesicovaginal fistula closure w/ tah  1989  . Arthoscopic surgery on right knee  2007    Current Outpatient Prescriptions  Medication Sig Dispense Refill  . alendronate (FOSAMAX) 70 MG tablet TAKE 1 TABLET EVERY WEEK IN THE MORNING 30 MIN  BEFORE EATING WITH AN 8OZ GLASS OF WATER (SIT UP 30 MIN) 12 tablet 1  . aspirin EC 81 MG tablet Take 1 tablet (81 mg total) by mouth daily. 150 tablet 2  . Calcium Carbonate-Vit D-Min (CALCIUM 1200) 1200-1000 MG-UNIT CHEW Chew 1 each by mouth daily.    . diazepam (VALIUM) 5 MG tablet TAKE 1 TABLET BY MOUTH DAILY AS NEEDED FOR ANXIETY. 30 tablet 4  . docusate sodium (COLACE) 100 MG capsule Take 200 mg by mouth daily as needed for mild constipation.    . Multiple Vitamins-Minerals (CENTRUM SILVER PO) Take 1 tablet by mouth daily.     . simvastatin (ZOCOR) 20 MG tablet TAKE (1) TABLET BY MOUTH AT BEDTIME FOR CHOLESTEROL. 30 tablet 3  . travoprost, benzalkonium, (TRAVATAN) 0.004 % ophthalmic solution Place 1 drop into both eyes at bedtime.       No current facility-administered medications for this visit.    Allergies as of 02/05/2015  . (No Known Allergies)    Family History  Problem Relation Age of Onset  . Hypertension Mother   . Glaucoma Mother   . Diverticulosis Mother   . Lung cancer Father   . Mental illness Sister   . GER disease Brother   . Colon polyps Brother   . Colon polyps Son   . Heart disease Mother   . Colon cancer Neg Hx     History  Social History  . Marital Status: Widowed    Spouse Name: N/A  . Number of Children: N/A  . Years of Education: N/A   Occupational History  . homemaker     Social History Main Topics  . Smoking status: Never Smoker   . Smokeless tobacco: Not on file     Comment: Never smoked  . Alcohol Use: No  . Drug Use: No  . Sexual Activity: Not on file   Other Topics Concern  . Not on file   Social History Narrative    Review of Systems: Gen: see HPI CV: Denies chest pain, heart palpitations, peripheral edema, syncope.  Resp: +cough since last year GI: see HPI GU : Denies urinary burning, urinary frequency, urinary hesitancy MS: Denies joint pain, muscle weakness, cramps, or limitation of movement.  Derm: Denies rash,  itching, dry skin Psych: Denies depression, anxiety, memory loss, and confusion Heme: Denies bruising, bleeding, and enlarged lymph nodes.  Physical Exam: BP 122/71 mmHg  Pulse 87  Temp(Src) 98.2 F (36.8 C) (Oral)  Ht 5\' 7"  (1.702 m)  Wt 116 lb (52.617 kg)  BMI 18.16 kg/m2 General:   Alert and oriented. Pleasant and cooperative. Well-nourished and well-developed.  Head:  Normocephalic and atraumatic. Eyes:  Without icterus, sclera clear and conjunctiva pink.  Ears:  Normal auditory acuity. Nose:  No deformity, discharge,  or lesions. Mouth:  No deformity or lesions, oral mucosa pink.  Lungs:  Clear to auscultation bilaterally. No wheezes, rales, or rhonchi. No distress.  Heart:  S1, S2 present without murmurs appreciated.  Abdomen:  +BS, soft, non-tender and non-distended. No HSM noted. No guarding or rebound. No masses appreciated.  Rectal:  Deferred  Msk:  Symmetrical without gross deformities. Normal posture. Extremities:  Without edema. Neurologic:  Alert and  oriented x4;  grossly normal neurologically. Psych:  Alert and cooperative. Normal mood and affect.

## 2015-02-06 NOTE — Assessment & Plan Note (Signed)
Resume Amitiza 24 mcg po BID. Samples and prescription provided.  

## 2015-02-06 NOTE — Assessment & Plan Note (Signed)
64 year old female with history of benign colonic polyp in 2011 and slated for 5 year surveillance due to family history of multiple colon polyps, presenting to schedule procedure. No concerning lower GI symptoms noted, although she does report vague weight loss that is likely multifactorial (see "loss of weight").   Proceed with TCS with Dr. Gala Romney in near future: the risks, benefits, and alternatives have been discussed with the patient in detail. The patient states understanding and desires to proceed.

## 2015-02-06 NOTE — Assessment & Plan Note (Signed)
Historically weighing in 120s, now in the 1teens. However, I feel this is related to a multitude of factors to include loss of husband, mother, diagnosis of diabetes, lifestyle changes. Doubt occult malignancy. Will continue to monitor weight and proceed with routine colonoscopy. Return in 6 months.

## 2015-02-06 NOTE — Progress Notes (Signed)
cc'ed to pcp °

## 2015-02-08 ENCOUNTER — Other Ambulatory Visit: Payer: Self-pay | Admitting: Family Medicine

## 2015-02-09 ENCOUNTER — Telehealth: Payer: Self-pay

## 2015-02-09 NOTE — Telephone Encounter (Signed)
Received fax from the pharmacy- pt needed a PA for Amitiza. PA was done and denied. Per insurance pt has to try and fail: linzess, lactulose, and miralax before they will pay for Amitiza.

## 2015-02-09 NOTE — Telephone Encounter (Signed)
Can we call the patient and ask if she has tried any of these already (mostly likely Miralax) and what the result was?

## 2015-02-20 NOTE — Telephone Encounter (Signed)
Pt called and cancelled her procedure for 08/26

## 2015-02-23 ENCOUNTER — Ambulatory Visit (HOSPITAL_COMMUNITY)
Admission: RE | Admit: 2015-02-23 | Payer: BLUE CROSS/BLUE SHIELD | Source: Ambulatory Visit | Admitting: Internal Medicine

## 2015-02-23 ENCOUNTER — Encounter (HOSPITAL_COMMUNITY): Admission: RE | Payer: Self-pay | Source: Ambulatory Visit

## 2015-02-23 SURGERY — COLONOSCOPY
Anesthesia: Moderate Sedation

## 2015-03-09 IMAGING — CT CT ABD-PELV W/ CM
2 of 4 series · 16 of 46 positions shown, 18 images · IV contrast (Omnipaque 300)
Comparison: 08/04/2005 and pelvic ultrasound dated 03/18/2010.

CLINICAL DATA: Epigastric abdominal pain and vomiting. Previous
cholecystectomy, hysterectomy and vesico vaginal fistula repair.

EXAM:
CT ABDOMEN AND PELVIS WITH CONTRAST
TECHNIQUE: Multidetector CT imaging of the abdomen and pelvis was performed
using the standard protocol following bolus administration of
intravenous contrast.
CONTRAST:  100mL OMNIPAQUE IOHEXOL 300 MG/ML  SOLN

[Series 3: abd_pel_with 5.0 b40f · axial · 0.59mm/px · z∈[-459,-59]mm · 13 of 90 slices shown, 15 images]
[im 5/90  soft-tissue]
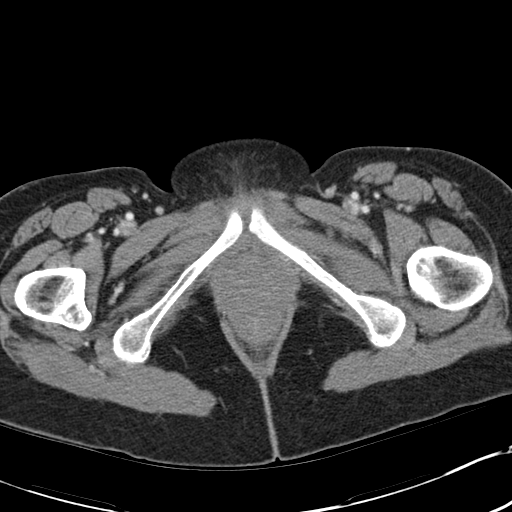
[im 5/90  bone]
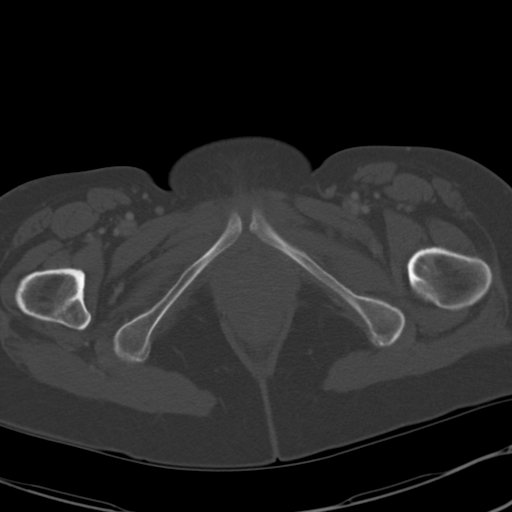
[im 13/90  soft-tissue]
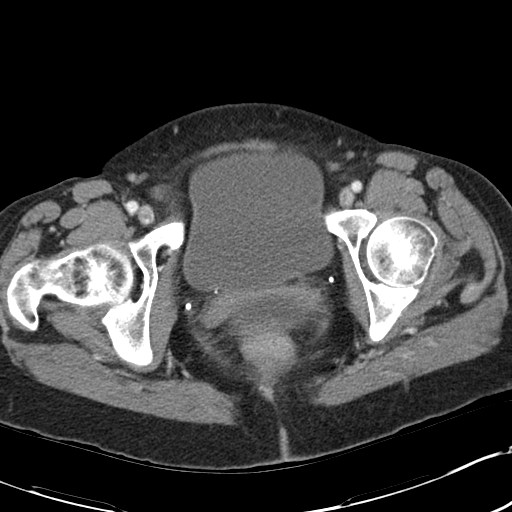
[im 17/90  soft-tissue]
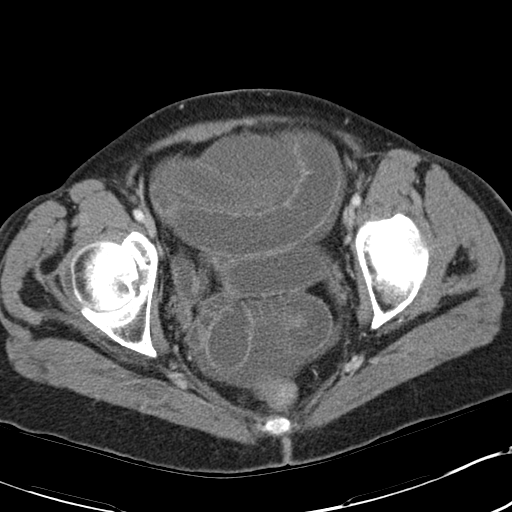
[im 26/90  soft-tissue]
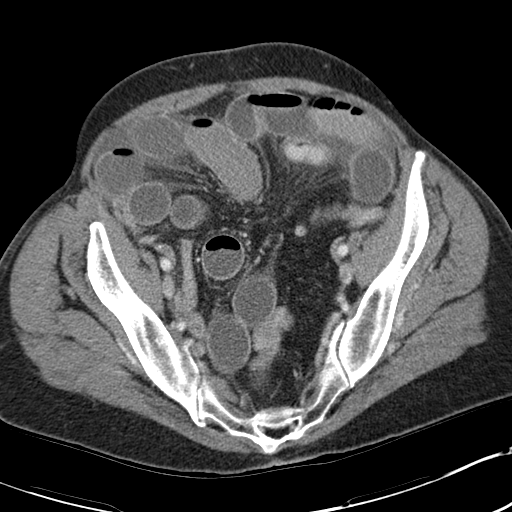
[im 30/90  soft-tissue]
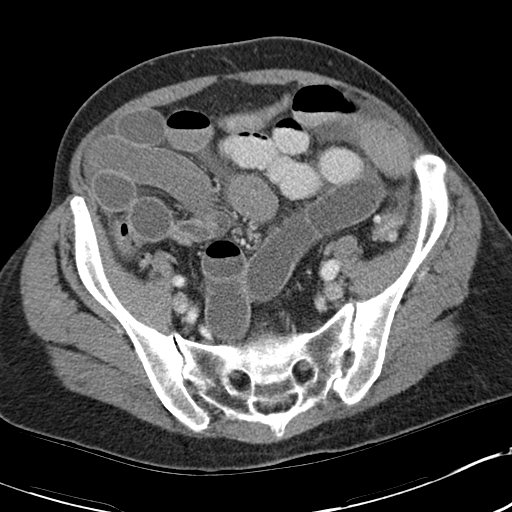
[im 39/90  soft-tissue]
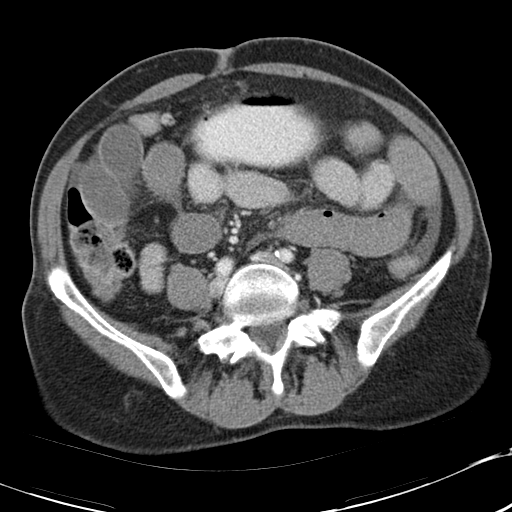
[im 47/90  soft-tissue]
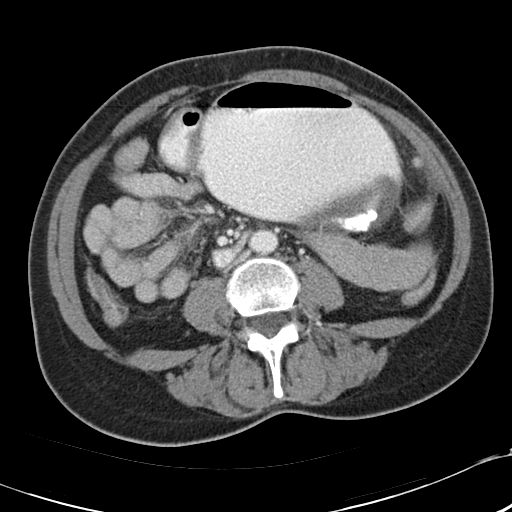
[im 51/90  soft-tissue]
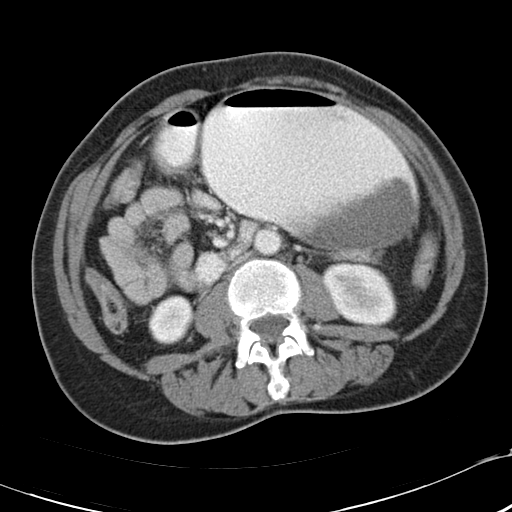
[im 60/90  soft-tissue]
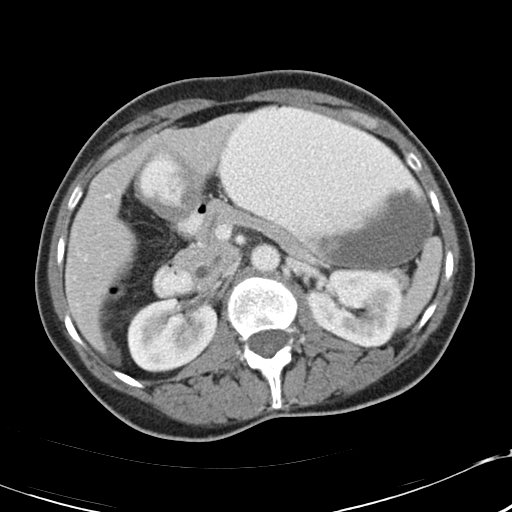
[im 60/90  bone]
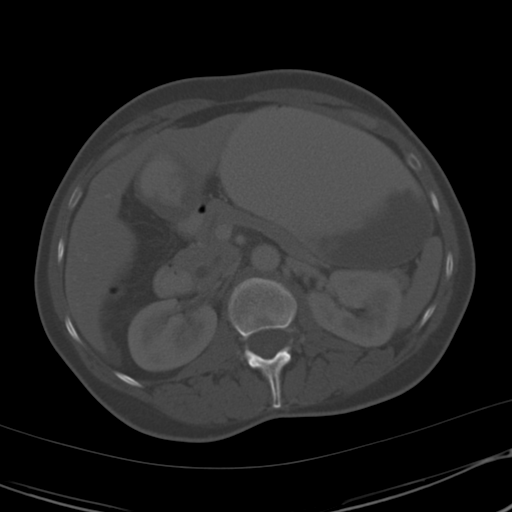
[im 64/90  soft-tissue]
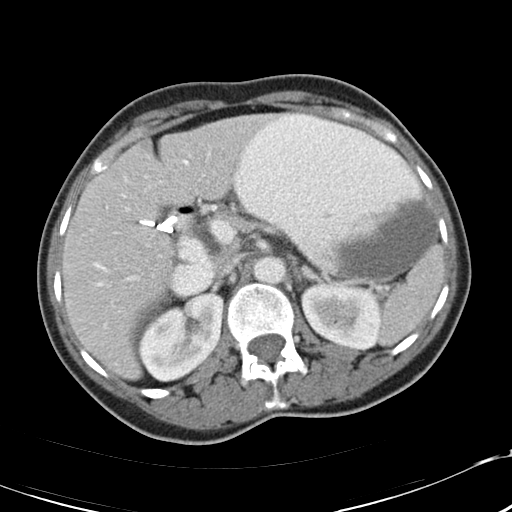
[im 73/90  soft-tissue]
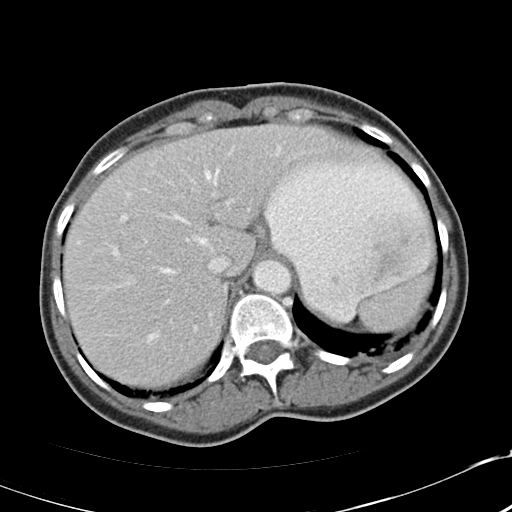
[im 77/90  soft-tissue]
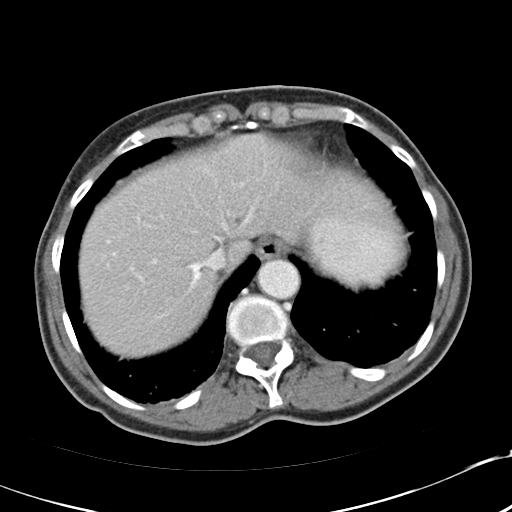
[im 85/90  soft-tissue]
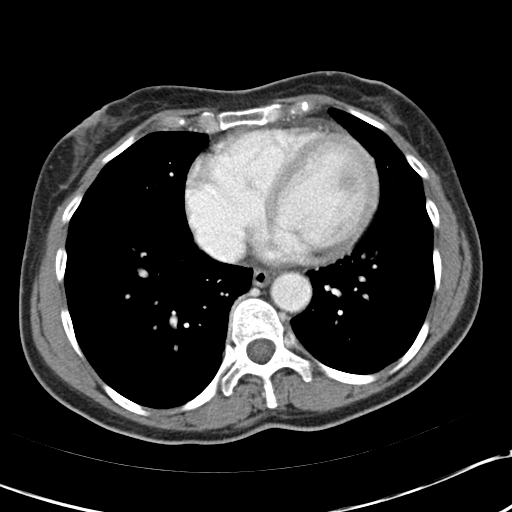

[Series 5: abd_pel_with 3.0 spo · coronal · 0.59mm/px · 3 of 81 slices shown]
[im 27/81  soft-tissue]
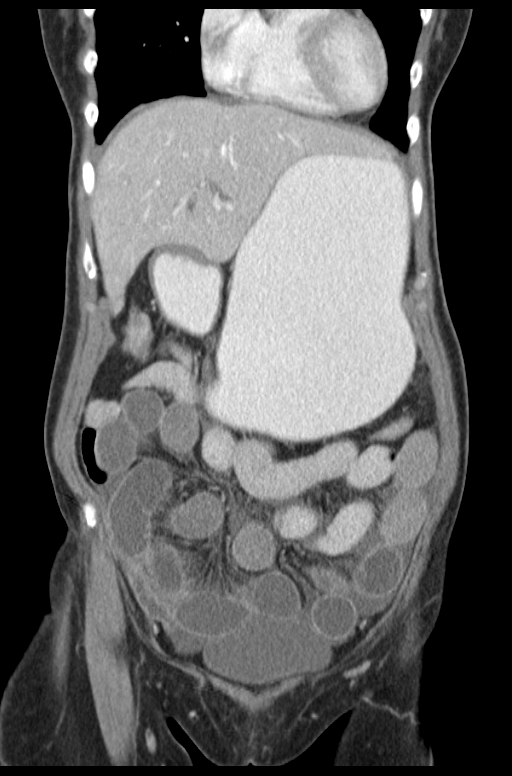
[im 36/81  soft-tissue]
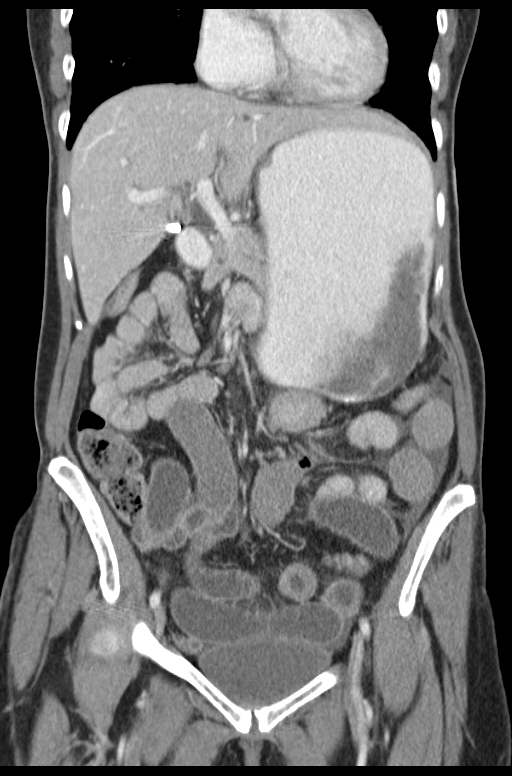
[im 45/81  soft-tissue]
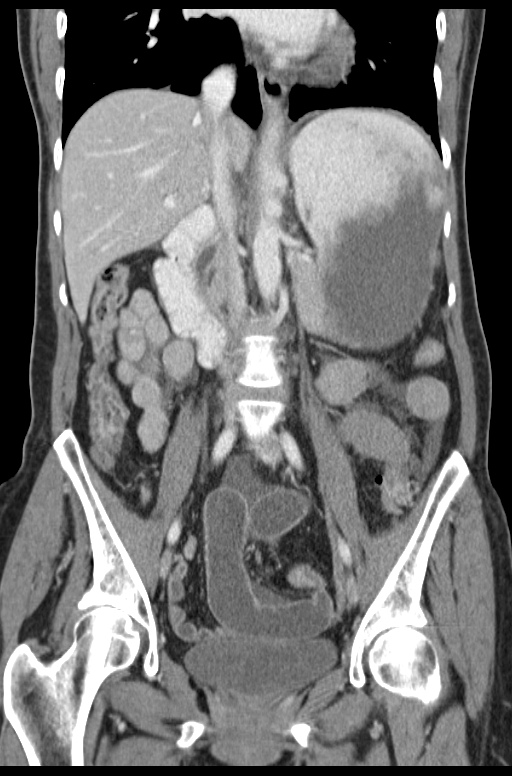

[16 of 46 positions shown; findings below may reference images not displayed]

FINDINGS: Multiple dilated proximal small bowel loops with normal caliber
distal loops. The stomach is also dilated. The transition to normal
caliber small bowel is in the right upper pelvis with some focal
luminal narrowing at that location. No hernia seen. No evidence of
appendicitis. Cholecystectomy clips. Small amount of free peritoneal
fluid.

Tiny bilateral renal calculi. No bladder or ureteral calculi seen
and no hydronephrosis. Surgically absent uterus. No adnexal masses
or enlarged lymph nodes.

Unremarkable spleen, pancreas and adrenal glands. Minimal
atelectasis at both lung bases. Minimal lumbar spine degenerative
changes.
IMPRESSION: 1. Small bowel obstruction, most likely due to an adhesion in the
upper right pelvis.
2. Small amount of free peritoneal fluid.
3. Minimal bibasilar atelectasis.
4. Tiny, bilateral, nonobstructing renal calculi.

## 2015-03-13 ENCOUNTER — Other Ambulatory Visit: Payer: Self-pay | Admitting: Family Medicine

## 2015-03-27 ENCOUNTER — Encounter: Payer: Self-pay | Admitting: Family Medicine

## 2015-03-27 ENCOUNTER — Ambulatory Visit (INDEPENDENT_AMBULATORY_CARE_PROVIDER_SITE_OTHER): Payer: BLUE CROSS/BLUE SHIELD | Admitting: Family Medicine

## 2015-03-27 VITALS — BP 102/70 | HR 82 | Resp 16 | Ht 67.0 in | Wt 115.1 lb

## 2015-03-27 DIAGNOSIS — Z0000 Encounter for general adult medical examination without abnormal findings: Secondary | ICD-10-CM | POA: Diagnosis not present

## 2015-03-27 DIAGNOSIS — Z23 Encounter for immunization: Secondary | ICD-10-CM

## 2015-03-27 DIAGNOSIS — Z1211 Encounter for screening for malignant neoplasm of colon: Secondary | ICD-10-CM | POA: Diagnosis not present

## 2015-03-27 NOTE — Progress Notes (Signed)
   Subjective:    Patient ID: Tamara Lam, female    DOB: 04/20/1951, 64 y.o.   MRN: [id number]  HPI Patient is in for annual physical exam. Concerned about moles and skin tags , and intends to have dermatology evaluate , which I strongly advocate also Recent labs,are reviewed. Immunization is reviewed , and  updated if needed.    Review of Systems See HPI     Objective:   Physical Exam BP 102/70 mmHg  Pulse 82  Resp 16  Ht 5\' 7"  (1.702 m)  Wt 115 lb 1.9 oz (52.218 kg)  BMI 18.03 kg/m2  SpO2 99%  Pleasant  female, alert and oriented x 3, in no cardio-pulmonary distress. Afebrile. HEENT No facial trauma or asymetry. Sinuses non tender.  Extra occullar muscles intact,  External ears normal, tympanic membranes clear. Oropharynx moist, no exudate,fairly  good dentition. Neck: supple, no adenopathy,JVD or thyromegaly.No bruits.  Chest: Clear to ascultation bilaterally.No crackles or wheezes. Non tender to palpation  Breast: No asymetry,no masses or lumps. No tenderness. No nipple discharge or inversion. No axillary or supraclavicular adenopathy  Cardiovascular system; Heart sounds normal,  S1 and  S2 ,no S3.  No murmur, or thrill. Apical beat not displaced Peripheral pulses normal.  Abdomen: Soft, non tender, no organomegaly or masses. No bruits. Bowel sounds normal. No guarding, tenderness or rebound.  Rectal:  Normal sphincter tone. No mass.No rectal masses.  Guaiac negative stool.  GU: External genitalia normal female genitalia , female distribution of hair. No lesions. Urethral meatus normal in size, no  Prolapse, no lesions visibly  Present. Bladder non tender. Vagina pink and moist , with no visible lesions , discharge present . Adequate pelvic support no  cystocele or rectocele noted  Uterus absent , no adnexal masses, no  adnexal tenderness.   Musculoskeletal exam: Full ROM of spine, hips , shoulders and knees. No deformity ,swelling or  crepitus noted. No muscle wasting or atrophy.   Neurologic: Cranial nerves 2 to 12 intact. Power, tone ,sensation and reflexes normal throughout. No disturbance in gait. No tremor.  Skin: Intact, no ulceration, erythema , scaling or rash noted.multiple pigmented nevi and skin tags on face, trunk and extremities  Psych; Normal mood and affect. Judgement and concentration normal         Assessment & Plan:  Annual physical exam Annual exam as documented. Counseling done  re healthy lifestyle involving commitment to 150 minutes exercise per week, heart healthy diet, and attaining healthy weight.The importance of adequate sleep also discussed. Regular seat belt use and home safety, is also discussed. Changes in health habits are decided on by the patient with goals and time frames  set for achieving them. Immunization and cancer screening needs are specifically addressed at this visit.   Special screening for malignant neoplasms, colon Rectal exam : no pal[pable mass and heme negative stool

## 2015-03-27 NOTE — Patient Instructions (Addendum)
Welcome to medicare in mid  May 2017, call if you need me before  Please follow through on recommended colonoscopy  CXR shows no fluid, infection or growth in your lungs, suggests mild COPD. If cough persists, recurs or worsens , I will refer you to lung specialist , notindicated now based on history and exam  Flu vaccine today  Excellent labs  Call next March for labs for May visit please  Please make and keep appt for skin specialist to evaluate and treat mole on right cheek and examine entire skin where you have areas of concern

## 2015-03-30 DIAGNOSIS — Z0000 Encounter for general adult medical examination without abnormal findings: Secondary | ICD-10-CM | POA: Insufficient documentation

## 2015-03-30 DIAGNOSIS — Z1211 Encounter for screening for malignant neoplasm of colon: Secondary | ICD-10-CM | POA: Insufficient documentation

## 2015-03-30 NOTE — Assessment & Plan Note (Signed)
Rectal exam : no pal[pable mass and heme negative stool

## 2015-03-30 NOTE — Assessment & Plan Note (Signed)

## 2015-04-02 LAB — POC HEMOCCULT BLD/STL (OFFICE/1-CARD/DIAGNOSTIC): FECAL OCCULT BLD: NEGATIVE

## 2015-04-02 NOTE — Addendum Note (Signed)
Addended by: Eual Fines on: 04/02/2015 11:29 AM   Modules accepted: Orders

## 2015-04-26 ENCOUNTER — Other Ambulatory Visit: Payer: Self-pay | Admitting: Family Medicine

## 2015-06-04 ENCOUNTER — Other Ambulatory Visit: Payer: Self-pay | Admitting: "Endocrinology

## 2015-06-04 DIAGNOSIS — E049 Nontoxic goiter, unspecified: Secondary | ICD-10-CM

## 2015-06-18 ENCOUNTER — Other Ambulatory Visit: Payer: Self-pay | Admitting: Family Medicine

## 2015-06-21 ENCOUNTER — Other Ambulatory Visit: Payer: Self-pay | Admitting: "Endocrinology

## 2015-06-21 ENCOUNTER — Ambulatory Visit (HOSPITAL_COMMUNITY)
Admission: RE | Admit: 2015-06-21 | Discharge: 2015-06-21 | Disposition: A | Discharge status: Home / Self Care | Payer: BLUE CROSS/BLUE SHIELD | Source: Ambulatory Visit | Attending: "Endocrinology | Admitting: "Endocrinology

## 2015-06-21 DIAGNOSIS — E042 Nontoxic multinodular goiter: Secondary | ICD-10-CM

## 2015-06-28 ENCOUNTER — Ambulatory Visit: Payer: Self-pay | Admitting: "Endocrinology

## 2015-07-09 ENCOUNTER — Ambulatory Visit: Payer: Self-pay | Admitting: "Endocrinology

## 2015-07-17 ENCOUNTER — Ambulatory Visit (INDEPENDENT_AMBULATORY_CARE_PROVIDER_SITE_OTHER): Payer: BLUE CROSS/BLUE SHIELD | Admitting: "Endocrinology

## 2015-07-17 ENCOUNTER — Encounter: Payer: Self-pay | Admitting: "Endocrinology

## 2015-07-17 VITALS — BP 114/75 | HR 80 | Ht 67.0 in | Wt 116.0 lb

## 2015-07-17 DIAGNOSIS — E042 Nontoxic multinodular goiter: Secondary | ICD-10-CM | POA: Diagnosis not present

## 2015-07-17 DIAGNOSIS — E119 Type 2 diabetes mellitus without complications: Secondary | ICD-10-CM

## 2015-07-17 NOTE — Progress Notes (Signed)
Subjective:    Patient ID: Tamara Lam, female    DOB: 07-12-1950, PCP Tula Nakayama, MD   Past Medical History  Diagnosis Date  . Anxiety 1980's   . Glaucoma 2004    Dr. Venetia Maxon, laser to left eye Sept 2011  . Weight loss   . Chronic nausea   . Hematochezia   . Chronic constipation   . Abnormal celiac antibody panel   . Hx of colonoscopy 08/2005    Dr. Gala Romney / hemorrhoids   . Helicobacter pylori gastritis 04/2004    last EGD   . Diabetes mellitus without complication ([place])   . Thyroid disease    Past Surgical History  Procedure Laterality Date  . Cholecystectomy  1990's  . Vesicovaginal fistula closure w/ tah  1989  . Arthoscopic surgery on right knee  2007  . Colonoscopy  2011    Dr. Gala Romney: anal canal/internal hemorrhoids, dimintuive rectosigmoid polyp (polypoid rectal mucosa), few sigmoid diverticula    Social History   Social History  . Marital Status: Widowed    Spouse Name: N/A  . Number of Children: N/A  . Years of Education: N/A   Occupational History  . homemaker     Social History Main Topics  . Smoking status: Never Smoker   . Smokeless tobacco: None     Comment: Never smoked  . Alcohol Use: No  . Drug Use: No  . Sexual Activity: Not Asked   Other Topics Concern  . None   Social History Narrative   Outpatient Encounter Prescriptions as of 07/17/2015  Medication Sig  . alendronate (FOSAMAX) 70 MG tablet TAKE 1 TABLET EVERY WEEK IN THE MORNING 30 MIN BEFORE EATING WITH AN 8OZ GLASS OF WATER (SIT UP 30 MIN)  . aspirin EC 81 MG tablet Take 1 tablet (81 mg total) by mouth daily.  . Calcium Carbonate-Vit D-Min (CALCIUM 1200) 1200-1000 MG-UNIT CHEW Chew 1 each by mouth daily.  . diazepam (VALIUM) 5 MG tablet TAKE 1 TABLET BY MOUTH DAILY AS NEEDED FOR ANXIETY.  Marland Kitchen docusate sodium (COLACE) 100 MG capsule Take 200 mg by mouth daily as needed for mild constipation.  Marland Kitchen lubiprostone (AMITIZA) 24 MCG capsule Take 1 capsule (24 mcg total) by mouth 2  (two) times daily with a meal.  . Multiple Vitamins-Minerals (CENTRUM SILVER PO) Take 1 tablet by mouth daily.   . simvastatin (ZOCOR) 20 MG tablet TAKE (1) TABLET BY MOUTH AT BEDTIME FOR CHOLESTEROL.  Marland Kitchen travoprost, benzalkonium, (TRAVATAN) 0.004 % ophthalmic solution Place 1 drop into both eyes at bedtime.     No facility-administered encounter medications on file as of 07/17/2015.   ALLERGIES: No Known Allergies VACCINATION STATUS: Immunization History  Administered Date(s) Administered  . Influenza Whole 03/13/2010, 03/12/2011  . Influenza,inj,Quad PF,36+ Mos 04/24/2013, 02/20/2014, 03/27/2015  . Pneumococcal Conjugate-13 06/20/2014  . Td 08/16/2009  . Zoster 02/17/2011    HPI  65 yr old female with medical hx as follows. She is here to follow up for her MNG and type 2 DM. She is s/p FNA of thyroid nodule which was benign last year. her repeat thyroid u/s showed steady bilateral thyroid nodules, but thyroid enlarging overall.  Her new TFTs are WNL. her a1c is better at 5.9%.  She denies heat/cold intolerance. denies any family hx of thyroid dysfunction nor thyroid cancer. she denies exposure to neck radiation. No dysphagia , nor SOB. she has steady weight.  Review of Systems  Constitutional: stable weight , no fatigue,  no subjective hyperthermia/hypothermia Eyes: no blurry vision, no xerophthalmia ENT: no sore throat, no nodules palpated in throat, no dysphagia/odynophagia, no hoarseness Cardiovascular: no CP/SOB/palpitations/leg swelling Respiratory: no cough/SOB Gastrointestinal: no N/V/D/C Musculoskeletal: no muscle/joint aches Skin: no rashes Neurological: no tremors/numbness/tingling/dizziness Psychiatric: no depression/anxiety   Objective:    BP 114/75 mmHg  Pulse 80  Ht 5\' 7"  (1.702 m)  Wt 116 lb (52.617 kg)  BMI 18.16 kg/m2  SpO2 98%  Wt Readings from Last 3 Encounters:  07/17/15 116 lb (52.617 kg)  03/27/15 115 lb 1.9 oz (52.218 kg)  02/05/15 116 lb  (52.617 kg)    Physical Exam Constitutional:  in NAD Eyes: PERRLA, EOMI, no exophthalmos ENT: moist mucous membranes, + thyromegaly, no cervical lymphadenopathy Cardiovascular: RRR, No MRG Respiratory: CTA B Gastrointestinal: abdomen soft, NT, ND, BS+ Musculoskeletal: no deformities, strength intact in all 4 Skin: moist, warm, no rashes Neurological: no tremor with outstretched hands, DTR normal in all 4  CMP ( most recent) CMP     Component Value Date/Time   NA 143 10/09/2014 1057   K 4.2 10/09/2014 1057   CL 105 10/09/2014 1057   CO2 31 10/09/2014 1057   GLUCOSE 76 10/09/2014 1057   BUN 13 10/09/2014 1057   CREATININE 0.65 10/09/2014 1057   CREATININE 0.60 08/18/2013 0557   CALCIUM 9.2 10/09/2014 1057   PROT 6.8 10/09/2014 1057   ALBUMIN 4.4 10/09/2014 1057   AST 22 10/09/2014 1057   ALT 16 10/09/2014 1057   ALKPHOS 34* 10/09/2014 1057   BILITOT 0.5 10/09/2014 1057   GFRNONAA >89 10/09/2014 1057   GFRNONAA >90 08/18/2013 0557   GFRAA >89 10/09/2014 1057   GFRAA >90 08/18/2013 0557     Diabetic Labs (most recent): Lab Results  Component Value Date   HGBA1C 5.9 12/20/2014     Lipid Panel ( most recent) Lipid Panel     Component Value Date/Time   CHOL 162 10/09/2014 1057   TRIG 74 10/09/2014 1057   HDL 67 10/09/2014 1057   CHOLHDL 2.4 10/09/2014 1057   VLDL 15 10/09/2014 1057   LDLCALC 80 10/09/2014 1057     06/21/2015: Thyroid function test within normal limits, A1c 5.9%.   Assessment & Plan:   1. Nontoxic multinodular goiter  her prior repeat thyroid ultrasound showed right lobe increased in size 6.8cms with largest previously biopsied nodule of 1.9cms, left lobe increased to 6.3 cms from 5.8 cms. last thyroid u/s was s/f stable or decreased nodule size bilaterally. Her prior THYROID , FINE NEEDLE ASPIRATION of RIGHT lobe nodule: FINDINGS CONSISTENT WITH HYPERPLASTIC LESION. She will not need intervention for this now.  repeat TFts and p/e in 12  months .   2. Diabetes mellitus without complication ([facility]) Her [id number] is now better at 5.9% from 6.5%. Since she is a light weight individual , she will not need medications. - dietary advice was given to her to avoid simple Carbs. May need repeat a1c with next visit.  - I advised patient to maintain close follow up with Tula Nakayama, MD for primary care needs. Follow up plan: Return in about 1 year (around 07/16/2016) for follow up with pre-visit labs.  Glade Lloyd, MD Phone: [phone]  Fax: [phone]   07/17/2015, 3:25 PM

## 2015-08-08 ENCOUNTER — Ambulatory Visit: Payer: BLUE CROSS/BLUE SHIELD | Admitting: Gastroenterology

## 2015-09-15 ENCOUNTER — Other Ambulatory Visit: Payer: Self-pay | Admitting: Family Medicine

## 2015-09-25 ENCOUNTER — Encounter: Payer: Self-pay | Admitting: Internal Medicine

## 2015-10-08 ENCOUNTER — Other Ambulatory Visit: Payer: Self-pay

## 2015-10-08 DIAGNOSIS — Z1231 Encounter for screening mammogram for malignant neoplasm of breast: Secondary | ICD-10-CM

## 2015-10-09 ENCOUNTER — Ambulatory Visit (INDEPENDENT_AMBULATORY_CARE_PROVIDER_SITE_OTHER): Payer: Medicare Other | Admitting: Gastroenterology

## 2015-10-09 ENCOUNTER — Other Ambulatory Visit: Payer: Self-pay

## 2015-10-09 ENCOUNTER — Encounter: Payer: Self-pay | Admitting: Gastroenterology

## 2015-10-09 VITALS — BP 129/79 | HR 67 | Temp 97.5°F | Ht 67.0 in | Wt 119.0 lb

## 2015-10-09 DIAGNOSIS — K5909 Other constipation: Secondary | ICD-10-CM

## 2015-10-09 DIAGNOSIS — Z86010 Personal history of colonic polyps: Secondary | ICD-10-CM

## 2015-10-09 MED ORDER — PEG 3350-KCL-NA BICARB-NACL 420 G PO SOLR: 4000.0000 mL | ORAL | Status: DC

## 2015-10-09 NOTE — Progress Notes (Signed)
Referring Provider: Fayrene Helper, MD Primary Care Physician:  Tula Nakayama, MD  Primary GI: Dr. Gala Romney   Chief Complaint  Patient presents with  . Follow-up  . set up TCS    HPI:   Tamara Lam is a 65 y.o. female presenting today due to need for updated colonoscopy. Last procedure in 2011 with anal canal/internal hemorrhoids, diminutive rectosigmoid polyp with path showing polypoid rectal mucosa. She has multiple family members with history of colon polyps.   No rectal bleeding. Feels more constipated than the last time when she was here in Aug 2016. Has taken Amitiza 24 mcg in the past. Has been taking prn, trying to spread it out. Feels like constipation is somewhat worse than it was. Will feel really constipated and then take the Amitiza and have to go 4-5 times.     Past Medical History  Diagnosis Date  . Anxiety 1980's   . Glaucoma 2004    Dr. Venetia Maxon, laser to left eye Sept 2011  . Weight loss   . Chronic nausea   . Hematochezia   . Chronic constipation   . Hx of colonoscopy 08/2005    Dr. Gala Romney / hemorrhoids   . Helicobacter pylori gastritis 04/2004    last EGD   . Diabetes mellitus without complication ([place])   . Thyroid disease     Past Surgical History  Procedure Laterality Date  . Cholecystectomy  1990's  . Vesicovaginal fistula closure w/ tah  1989  . Arthoscopic surgery on right knee  2007  . Colonoscopy  2011    Dr. Gala Romney: anal canal/internal hemorrhoids, dimintuive rectosigmoid polyp (polypoid rectal mucosa), few sigmoid diverticula     Current Outpatient Prescriptions  Medication Sig Dispense Refill  . alendronate (FOSAMAX) 70 MG tablet TAKE 1 TABLET ONCE WEEKLY IN THE AM 30 MIN BEFORE EATING WITH AN 8OZ GLASS OF WATER (SIT UP 30 MIN) 12 tablet 0  . aspirin EC 81 MG tablet Take 1 tablet (81 mg total) by mouth daily. 150 tablet 2  . Calcium Carbonate-Vit D-Min (CALCIUM 1200) 1200-1000 MG-UNIT CHEW Chew 1 each by mouth daily.    .  diazepam (VALIUM) 5 MG tablet TAKE 1 TABLET BY MOUTH DAILY AS NEEDED FOR ANXIETY. 30 tablet 1  . docusate sodium (COLACE) 100 MG capsule Take 200 mg by mouth daily as needed for mild constipation.    Marland Kitchen lubiprostone (AMITIZA) 24 MCG capsule Take 1 capsule (24 mcg total) by mouth 2 (two) times daily with a meal. 60 capsule 3  . Multiple Vitamins-Minerals (CENTRUM SILVER PO) Take 1 tablet by mouth daily.     . simvastatin (ZOCOR) 20 MG tablet TAKE (1) TABLET BY MOUTH AT BEDTIME FOR CHOLESTEROL. 30 tablet 3  . travoprost, benzalkonium, (TRAVATAN) 0.004 % ophthalmic solution Place 1 drop into both eyes at bedtime.      . polyethylene glycol-electrolytes (TRILYTE) 420 g solution Take 4,000 mLs by mouth as directed. 4000 mL 0   No current facility-administered medications for this visit.    Allergies as of 10/09/2015  . (No Known Allergies)    Family History  Problem Relation Age of Onset  . Hypertension Mother   . Glaucoma Mother   . Diverticulosis Mother   . Lung cancer Father   . Mental illness Sister   . GER disease Brother   . Colon polyps Brother   . Colon polyps Son   . Heart disease Mother   . Colon cancer Neg Hx  Social History   Social History  . Marital Status: Widowed    Spouse Name: N/A  . Number of Children: N/A  . Years of Education: N/A   Occupational History  . homemaker     Social History Main Topics  . Smoking status: Never Smoker   . Smokeless tobacco: None     Comment: Never smoked  . Alcohol Use: No  . Drug Use: No  . Sexual Activity: Not Asked   Other Topics Concern  . None   Social History Narrative    Review of Systems: Gen: Denies fever, chills, anorexia. Denies fatigue, weakness, weight loss.  CV: Denies chest pain, palpitations, syncope, peripheral edema, and claudication. Resp: Denies dyspnea at rest, cough, wheezing, coughing up blood, and pleurisy. GI: Denies vomiting blood, jaundice, and fecal incontinence.   Denies dysphagia or  odynophagia. Derm: Denies rash, itching, dry skin Psych: occasional depression  Heme: Denies bruising, bleeding, and enlarged lymph nodes.  Physical Exam: BP 129/79 mmHg  Pulse 67  Temp(Src) 97.5 F (36.4 C)  Ht 5\' 7"  (1.702 m)  Wt 119 lb (53.978 kg)  BMI 18.63 kg/m2 General:   Alert and oriented. No distress noted. Pleasant and cooperative.  Head:  Normocephalic and atraumatic. Eyes:  Conjuctiva clear without scleral icterus. Mouth:  Oral mucosa pink and moist. Good dentition. No lesions. Heart:  S1, S2 present without murmurs, rubs, or gallops. Regular rate and rhythm. Abdomen:  +BS, soft, non-tender and non-distended. No rebound or guarding. No HSM or masses noted. Msk:  Symmetrical without gross deformities. Normal posture. Extremities:  Without edema. Neurologic:  Alert and  oriented x4;  grossly normal neurologically. Psych:  Alert and cooperative. Normal mood and affect.

## 2015-10-09 NOTE — Assessment & Plan Note (Signed)
Amitiza 24 mcg not working as well. Trial Trulance 3 mg once daily, with or without food. Samples provided.

## 2015-10-09 NOTE — Assessment & Plan Note (Signed)
65 year old female with history of benign colon polyp in 2011 and slated or 5 year surveillance due to family history of multiple colon polyps. No concerning lower GI symptoms. Chronic constipation noted, no rectal bleeding.  Proceed with TCS with Dr. Gala Romney in near future: the risks, benefits, and alternatives have been discussed with the patient in detail. The patient states understanding and desires to proceed. Phenergan 12.5 mg IV on call (takes valium prn).

## 2015-10-09 NOTE — Patient Instructions (Signed)
Start taking Trulance one tablet with or without food daily. I have given you samples for a week. This is for constipation. Let me know how this works for you!  We have scheduled you for a colonoscopy with Dr. Gala Romney in the near future!

## 2015-10-10 NOTE — Progress Notes (Signed)
cc'ed to pcp °

## 2015-10-16 ENCOUNTER — Telehealth: Payer: Self-pay | Admitting: Internal Medicine

## 2015-10-16 MED ORDER — PLECANATIDE 3 MG PO TABS: 1.0000 | ORAL_TABLET | Freq: Every day | ORAL | Status: DC

## 2015-10-16 NOTE — Telephone Encounter (Signed)
[phone]  PATIENT CAME IN AND STATED THAT THE TRULANCE 3MG  THAT WAS GIVEN TO HER BY ANNA WAS WORKING AND HER PHARMACY IS [facility]

## 2015-10-16 NOTE — Telephone Encounter (Signed)
Pt asked for another sample box. Gave her one box and she is requesting rx sent to her pharmacy.

## 2015-10-16 NOTE — Telephone Encounter (Signed)
I sent in prescription

## 2015-10-19 ENCOUNTER — Other Ambulatory Visit: Payer: Self-pay

## 2015-10-19 MED ORDER — ALENDRONATE SODIUM 70 MG PO TABS: ORAL_TABLET | ORAL | Status: DC

## 2015-10-19 MED ORDER — DIAZEPAM 5 MG PO TABS: ORAL_TABLET | ORAL | Status: DC

## 2015-10-19 MED ORDER — SIMVASTATIN 20 MG PO TABS: ORAL_TABLET | ORAL | Status: DC

## 2015-10-22 ENCOUNTER — Telehealth: Payer: Self-pay

## 2015-10-22 DIAGNOSIS — E785 Hyperlipidemia, unspecified: Secondary | ICD-10-CM

## 2015-10-22 DIAGNOSIS — Z114 Encounter for screening for human immunodeficiency virus [HIV]: Secondary | ICD-10-CM

## 2015-10-22 DIAGNOSIS — E119 Type 2 diabetes mellitus without complications: Secondary | ICD-10-CM

## 2015-10-22 DIAGNOSIS — Z1159 Encounter for screening for other viral diseases: Secondary | ICD-10-CM

## 2015-10-22 DIAGNOSIS — Z1321 Encounter for screening for nutritional disorder: Secondary | ICD-10-CM

## 2015-10-22 NOTE — Telephone Encounter (Signed)
Labs ordered to be done prior to appointment and mailed to home address

## 2015-10-23 NOTE — Telephone Encounter (Signed)
PA done for Trulance. It was denied, pt has not tried and failed linzess.

## 2015-10-23 NOTE — Telephone Encounter (Signed)
Pt is aware and is willing to try linzess. I have left samples and instructions at the front desk.

## 2015-10-23 NOTE — Telephone Encounter (Signed)
May give samples of Linzess 145 mcg. IF she likes Linzess, can stay with that. If not, then can try to get Trulance approved since she will have failed both Linzess and Amitiza

## 2015-10-26 ENCOUNTER — Ambulatory Visit
Admission: RE | Admit: 2015-10-26 | Discharge: 2015-10-26 | Disposition: A | Discharge status: Home / Self Care | Payer: Medicare Other | Source: Ambulatory Visit

## 2015-10-26 DIAGNOSIS — Z1231 Encounter for screening mammogram for malignant neoplasm of breast: Secondary | ICD-10-CM

## 2015-10-29 ENCOUNTER — Telehealth: Payer: Self-pay

## 2015-10-29 NOTE — Telephone Encounter (Signed)
I tried to call the patient, no answer and I was unable to leave a message  

## 2015-10-29 NOTE — Telephone Encounter (Signed)
Pt called to talk about her TCS and your co-pay part. She stated that she should not have a co-pay. I told her that since she has a history of polyps it will always be coded as that and that is why she has a co-pay. She understood

## 2015-10-29 NOTE — Telephone Encounter (Signed)
Pt is calling back ans she just don't understand why she would have to have it coded that way since it has been 5 years ago.

## 2015-10-30 ENCOUNTER — Telehealth: Payer: Self-pay | Admitting: General Practice

## 2015-10-30 ENCOUNTER — Encounter (HOSPITAL_COMMUNITY)
Admission: RE | Disposition: A | Discharge status: Home / Self Care | Payer: Self-pay | Source: Ambulatory Visit | Attending: Internal Medicine

## 2015-10-30 ENCOUNTER — Encounter (HOSPITAL_COMMUNITY): Payer: Self-pay | Admitting: *Deleted

## 2015-10-30 ENCOUNTER — Ambulatory Visit (HOSPITAL_COMMUNITY)
Admission: RE | Admit: 2015-10-30 | Discharge: 2015-10-30 | Disposition: A | Discharge status: Home / Self Care | Payer: Medicare Other | Source: Ambulatory Visit | Attending: Internal Medicine | Admitting: Internal Medicine

## 2015-10-30 DIAGNOSIS — E079 Disorder of thyroid, unspecified: Secondary | ICD-10-CM | POA: Diagnosis not present

## 2015-10-30 DIAGNOSIS — Z7982 Long term (current) use of aspirin: Secondary | ICD-10-CM | POA: Diagnosis not present

## 2015-10-30 DIAGNOSIS — K5730 Diverticulosis of large intestine without perforation or abscess without bleeding: Secondary | ICD-10-CM

## 2015-10-30 DIAGNOSIS — R634 Abnormal weight loss: Secondary | ICD-10-CM | POA: Diagnosis not present

## 2015-10-30 DIAGNOSIS — Z79899 Other long term (current) drug therapy: Secondary | ICD-10-CM | POA: Insufficient documentation

## 2015-10-30 DIAGNOSIS — Z86010 Personal history of colonic polyps: Secondary | ICD-10-CM

## 2015-10-30 DIAGNOSIS — E119 Type 2 diabetes mellitus without complications: Secondary | ICD-10-CM | POA: Diagnosis not present

## 2015-10-30 DIAGNOSIS — K640 First degree hemorrhoids: Secondary | ICD-10-CM | POA: Insufficient documentation

## 2015-10-30 DIAGNOSIS — Z8371 Family history of colonic polyps: Secondary | ICD-10-CM | POA: Diagnosis not present

## 2015-10-30 DIAGNOSIS — Z1211 Encounter for screening for malignant neoplasm of colon: Secondary | ICD-10-CM | POA: Diagnosis not present

## 2015-10-30 DIAGNOSIS — F419 Anxiety disorder, unspecified: Secondary | ICD-10-CM | POA: Diagnosis not present

## 2015-10-30 HISTORY — PX: COLONOSCOPY: SHX5424

## 2015-10-30 SURGERY — COLONOSCOPY
Anesthesia: Moderate Sedation

## 2015-10-30 MED ORDER — MIDAZOLAM HCL 5 MG/5ML IJ SOLN
INTRAMUSCULAR | Status: DC | PRN
  Administered 2015-10-30: 2 mg via INTRAVENOUS
  Administered 2015-10-30: 1 mg via INTRAVENOUS

## 2015-10-30 MED ORDER — MEPERIDINE HCL 100 MG/ML IJ SOLN
INTRAMUSCULAR | Status: AC
  Filled 2015-10-30: qty 2

## 2015-10-30 MED ORDER — PROMETHAZINE HCL 25 MG/ML IJ SOLN
INTRAMUSCULAR | Status: AC
  Filled 2015-10-30: qty 1

## 2015-10-30 MED ORDER — ONDANSETRON HCL 4 MG/2ML IJ SOLN
INTRAMUSCULAR | Status: DC
Start: 2015-10-30 — End: 2015-10-30
  Filled 2015-10-30: qty 2

## 2015-10-30 MED ORDER — SODIUM CHLORIDE 0.9 % IV SOLN
INTRAVENOUS | Status: DC
  Administered 2015-10-30: 09:00:00 via INTRAVENOUS

## 2015-10-30 MED ORDER — PROMETHAZINE HCL 25 MG/ML IJ SOLN
12.5000 mg | Freq: Once | INTRAMUSCULAR | Status: AC
  Administered 2015-10-30: 12.5 mg via INTRAVENOUS

## 2015-10-30 MED ORDER — MIDAZOLAM HCL 5 MG/5ML IJ SOLN
INTRAMUSCULAR | Status: DC
Start: 2015-10-30 — End: 2015-10-30
  Filled 2015-10-30: qty 10

## 2015-10-30 MED ORDER — MEPERIDINE HCL 100 MG/ML IJ SOLN
INTRAMUSCULAR | Status: DC | PRN
Start: 2015-10-30 — End: 2015-10-30
  Administered 2015-10-30: 50 mg via INTRAVENOUS

## 2015-10-30 MED ORDER — STERILE WATER FOR IRRIGATION IR SOLN
Status: DC | PRN
  Administered 2015-10-30: 10:00:00

## 2015-10-30 MED ORDER — ONDANSETRON HCL 4 MG/2ML IJ SOLN
INTRAMUSCULAR | Status: DC | PRN
  Administered 2015-10-30: 4 mg via INTRAVENOUS

## 2015-10-30 MED ORDER — SODIUM CHLORIDE 0.9% FLUSH
INTRAVENOUS | Status: DC
Start: 2015-10-30 — End: 2015-10-30
  Filled 2015-10-30: qty 10

## 2015-10-30 NOTE — Op Note (Signed)
[facility] Patient Name: Tamara Lam Procedure Date: 10/30/2015 9:54 AM MRN: [id number] Date of Birth: 1950-11-02 Attending MD: Norvel Richards , MD CSN: [id number] Age: 65 Admit Type: Outpatient Procedure:                Colonoscopy - high risk screening; Linzezss 145 has                            modestly helped constipation Indications:              Colon cancer screening in patient at increased                            risk: Family history of 1st-degree relative with                            colon polyps Providers:                Norvel Richards, MD, Hinton Rao, RN, Georgeann Oppenheim, Technician Referring MD:             Norwood Levo. Moshe Cipro, MD Medicines:                Midazolam 4 mg IV, Meperidine 50 mg IV,                            Promethazine 12.5 mg IV, Ondansetron 4 mg IV Complications:            No immediate complications. Estimated Blood Loss:     Estimated blood loss: none. Procedure:                Pre-Anesthesia Assessment:                           - Prior to the procedure, a History and Physical                            was performed, and patient medications and                            allergies were reviewed. The patient's tolerance of                            previous anesthesia was also reviewed. The risks                            and benefits of the procedure and the sedation                            options and risks were discussed with the patient.                            All questions were answered, and informed consent  was obtained. ASA Grade Assessment: II - A patient                            with mild systemic disease. After reviewing the                            risks and benefits, the patient was deemed in                            satisfactory condition to undergo the procedure.                           After obtaining informed consent, the colonoscope                       was passed under direct vision. Throughout the                            procedure, the patient's blood pressure, pulse, and                            oxygen saturations were monitored continuously. The                            [id number] scope was introduced through                            the anus and advanced to the the cecum, identified                            by appendiceal orifice and ileocecal valve. The                            colonoscopy was performed without difficulty. The                            patient tolerated the procedure well. The quality                            of the bowel preparation was adequate. The                            ileocecal valve, appendiceal orifice, and rectum                            were photographed. Scope In: 10:04:37 AM Scope Out: 10:16:42 AM Scope Withdrawal Time: 0 hours 7 minutes 21 seconds  Total Procedure Duration: 0 hours 12 minutes 5 seconds  Findings:      The perianal and digital rectal examinations were normal.      Multiple small-mouthed diverticula were found in the entire colon. .      Non-bleeding hemorrhoids were found during retroflexion. The hemorrhoids       were Grade I (internal hemorrhoids that do not prolapse). The remainder       of the  rectum and colon appear normal. Impression:               - Diverticulosis in the entire examined colon.                           - Non-bleeding hemorrhoids.                           - No specimens collected. Moderate Sedation:      Moderate (conscious) sedation was administered by the endoscopy nurse       and supervised by the endoscopist. The following parameters were       monitored: oxygen saturation, heart rate, blood pressure, respiratory       rate, EKG, adequacy of pulmonary ventilation, and response to care.       Total physician intraservice time was 16 minutes. Recommendation:           - Repeat colonoscopy in 5 years for  screening                            purposes.                           - Return to GI office in 3 months.                           - Use Benefiber one teaspoon PO BID indefinitely.                           - Increase Linzess (linaclotide) 290 mcg PO daily                            indefinitely.                           - Advance diet as tolerated today. Procedure Code(s):        --- Professional ---                           [phone], Colonoscopy, flexible; diagnostic, including                            collection of specimen(s) by brushing or washing,                            when performed (separate procedure)                           99152, Moderate sedation services provided by the                            same physician or other qualified health care                            professional performing the diagnostic or  therapeutic service that the sedation supports,                            requiring the presence of an independent trained                            observer to assist in the monitoring of the                            patient's level of consciousness and physiological                            status; initial 15 minutes of intraservice time,                            patient age 32 years or older Diagnosis Code(s):        --- Professional ---                           Z83.71, Family history of colonic polyps                           K64.0, First degree hemorrhoids                           K57.30, Diverticulosis of large intestine without                            perforation or abscess without bleeding CPT copyright 2016 [employer] Association. All rights reserved. The codes documented in this report are preliminary and upon coder review may  be revised to meet current compliance requirements. Cristopher Estimable. Amrita Radu, MD Norvel Richards, MD 10/30/2015 10:28:52 AM This report has been signed electronically. Number of  Addenda: 0

## 2015-10-30 NOTE — Interval H&P Note (Signed)
History and Physical Interval Note:  10/30/2015 9:54 AM  Tamara Lam  has presented today for surgery, with the diagnosis of history of polyps  The various methods of treatment have been discussed with the patient and family. After consideration of risks, benefits and other options for treatment, the patient has consented to  Procedure(s) with comments: COLONOSCOPY (N/A) - 1015 as a surgical intervention .  The patient's history has been reviewed, patient examined, no change in status, stable for surgery.  I have reviewed the patient's chart and labs.  Questions were answered to the patient's satisfaction.     Suleman Gunning  No change. Linzess helping at 145 - May need to go up to 290.  No change. High-risk screening colonoscopy per plan.  The risks, benefits, limitations, alternatives and imponderables have been reviewed with the patient. Questions have been answered. All parties are agreeable.

## 2015-10-30 NOTE — Telephone Encounter (Signed)
Pt called this afternoon to follow up from a conversation she had yesterday with GF about a bill. I told her that CM had tried to call her yesterday and could not leave a message. I told patient that CM had already left for the day and I would let her know that she had called. Pt said that she would try again tomorrow.

## 2015-10-30 NOTE — H&P (View-Only) (Signed)
Referring Provider: Fayrene Helper, MD Primary Care Physician:  Tula Nakayama, MD  Primary GI: Dr. Gala Romney   Chief Complaint  Patient presents with  . Follow-up  . set up TCS    HPI:   Tamara Lam is a 65 y.o. female presenting today due to need for updated colonoscopy. Last procedure in 2011 with anal canal/internal hemorrhoids, diminutive rectosigmoid polyp with path showing polypoid rectal mucosa. She has multiple family members with history of colon polyps.   No rectal bleeding. Feels more constipated than the last time when she was here in Aug 2016. Has taken Amitiza 24 mcg in the past. Has been taking prn, trying to spread it out. Feels like constipation is somewhat worse than it was. Will feel really constipated and then take the Amitiza and have to go 4-5 times.     Past Medical History  Diagnosis Date  . Anxiety 1980's   . Glaucoma 2004    Dr. Venetia Maxon, laser to left eye Sept 2011  . Weight loss   . Chronic nausea   . Hematochezia   . Chronic constipation   . Hx of colonoscopy 08/2005    Dr. Gala Romney / hemorrhoids   . Helicobacter pylori gastritis 04/2004    last EGD   . Diabetes mellitus without complication ([place])   . Thyroid disease     Past Surgical History  Procedure Laterality Date  . Cholecystectomy  1990's  . Vesicovaginal fistula closure w/ tah  1989  . Arthoscopic surgery on right knee  2007  . Colonoscopy  2011    Dr. Gala Romney: anal canal/internal hemorrhoids, dimintuive rectosigmoid polyp (polypoid rectal mucosa), few sigmoid diverticula     Current Outpatient Prescriptions  Medication Sig Dispense Refill  . alendronate (FOSAMAX) 70 MG tablet TAKE 1 TABLET ONCE WEEKLY IN THE AM 30 MIN BEFORE EATING WITH AN 8OZ GLASS OF WATER (SIT UP 30 MIN) 12 tablet 0  . aspirin EC 81 MG tablet Take 1 tablet (81 mg total) by mouth daily. 150 tablet 2  . Calcium Carbonate-Vit D-Min (CALCIUM 1200) 1200-1000 MG-UNIT CHEW Chew 1 each by mouth daily.    .  diazepam (VALIUM) 5 MG tablet TAKE 1 TABLET BY MOUTH DAILY AS NEEDED FOR ANXIETY. 30 tablet 1  . docusate sodium (COLACE) 100 MG capsule Take 200 mg by mouth daily as needed for mild constipation.    Marland Kitchen lubiprostone (AMITIZA) 24 MCG capsule Take 1 capsule (24 mcg total) by mouth 2 (two) times daily with a meal. 60 capsule 3  . Multiple Vitamins-Minerals (CENTRUM SILVER PO) Take 1 tablet by mouth daily.     . simvastatin (ZOCOR) 20 MG tablet TAKE (1) TABLET BY MOUTH AT BEDTIME FOR CHOLESTEROL. 30 tablet 3  . travoprost, benzalkonium, (TRAVATAN) 0.004 % ophthalmic solution Place 1 drop into both eyes at bedtime.      . polyethylene glycol-electrolytes (TRILYTE) 420 g solution Take 4,000 mLs by mouth as directed. 4000 mL 0   No current facility-administered medications for this visit.    Allergies as of 10/09/2015  . (No Known Allergies)    Family History  Problem Relation Age of Onset  . Hypertension Mother   . Glaucoma Mother   . Diverticulosis Mother   . Lung cancer Father   . Mental illness Sister   . GER disease Brother   . Colon polyps Brother   . Colon polyps Son   . Heart disease Mother   . Colon cancer Neg Hx  Social History   Social History  . Marital Status: Widowed    Spouse Name: N/A  . Number of Children: N/A  . Years of Education: N/A   Occupational History  . homemaker     Social History Main Topics  . Smoking status: Never Smoker   . Smokeless tobacco: None     Comment: Never smoked  . Alcohol Use: No  . Drug Use: No  . Sexual Activity: Not Asked   Other Topics Concern  . None   Social History Narrative    Review of Systems: Gen: Denies fever, chills, anorexia. Denies fatigue, weakness, weight loss.  CV: Denies chest pain, palpitations, syncope, peripheral edema, and claudication. Resp: Denies dyspnea at rest, cough, wheezing, coughing up blood, and pleurisy. GI: Denies vomiting blood, jaundice, and fecal incontinence.   Denies dysphagia or  odynophagia. Derm: Denies rash, itching, dry skin Psych: occasional depression  Heme: Denies bruising, bleeding, and enlarged lymph nodes.  Physical Exam: BP 129/79 mmHg  Pulse 67  Temp(Src) 97.5 F (36.4 C)  Ht 5\' 7"  (1.702 m)  Wt 119 lb (53.978 kg)  BMI 18.63 kg/m2 General:   Alert and oriented. No distress noted. Pleasant and cooperative.  Head:  Normocephalic and atraumatic. Eyes:  Conjuctiva clear without scleral icterus. Mouth:  Oral mucosa pink and moist. Good dentition. No lesions. Heart:  S1, S2 present without murmurs, rubs, or gallops. Regular rate and rhythm. Abdomen:  +BS, soft, non-tender and non-distended. No rebound or guarding. No HSM or masses noted. Msk:  Symmetrical without gross deformities. Normal posture. Extremities:  Without edema. Neurologic:  Alert and  oriented x4;  grossly normal neurologically. Psych:  Alert and cooperative. Normal mood and affect.

## 2015-10-30 NOTE — Discharge Instructions (Signed)
Colonoscopy Discharge Instructions  Read the instructions outlined below and refer to this sheet in the next few weeks. These discharge instructions provide you with general information on caring for yourself after you leave the hospital. Your doctor may also give you specific instructions. While your treatment has been planned according to the most current medical practices available, unavoidable complications occasionally occur. If you have any problems or questions after discharge, call Dr. Gala Romney at [phone]. ACTIVITY  You may resume your regular activity, but move at a slower pace for the next 24 hours.   Take frequent rest periods for the next 24 hours.   Walking will help get rid of the air and reduce the bloated feeling in your belly (abdomen).   No driving for 24 hours (because of the medicine (anesthesia) used during the test).    Do not sign any important legal documents or operate any machinery for 24 hours (because of the anesthesia used during the test).  NUTRITION  Drink plenty of fluids.   You may resume your normal diet as instructed by your doctor.   Begin with a light meal and progress to your normal diet. Heavy or fried foods are harder to digest and may make you feel sick to your stomach (nauseated).   Avoid alcoholic beverages for 24 hours or as instructed.  MEDICATIONS  You may resume your normal medications unless your doctor tells you otherwise.  WHAT YOU CAN EXPECT TODAY  Some feelings of bloating in the abdomen.   Passage of more gas than usual.   Spotting of blood in your stool or on the toilet paper.  IF YOU HAD POLYPS REMOVED DURING THE COLONOSCOPY:  No aspirin products for 7 days or as instructed.   No alcohol for 7 days or as instructed.   Eat a soft diet for the next 24 hours.  FINDING OUT THE RESULTS OF YOUR TEST Not all test results are available during your visit. If your test results are not back during the visit, make an appointment  with your caregiver to find out the results. Do not assume everything is normal if you have not heard from your caregiver or the medical facility. It is important for you to follow up on all of your test results.  SEEK IMMEDIATE MEDICAL ATTENTION IF:  You have more than a spotting of blood in your stool.   Your belly is swollen (abdominal distention).   You are nauseated or vomiting.   You have a temperature over 101.   You have abdominal pain or discomfort that is severe or gets worse throughout the day.    Diverticulosis and constipation information provided  Benefiber 1 tablespoon twice daily  Increase Linzess to 290 once daily  Office visit with [place] in 3 months  Repeat colonoscopy in 5 years.  Constipation, Adult Constipation is when a person has fewer than three bowel movements a week, has difficulty having a bowel movement, or has stools that are dry, hard, or larger than normal. As people grow older, constipation is more common. A low-fiber diet, not taking in enough fluids, and taking certain medicines may make constipation worse.  CAUSES   Certain medicines, such as antidepressants, pain medicine, iron supplements, antacids, and water pills.   Certain diseases, such as diabetes, irritable bowel syndrome (IBS), thyroid disease, or depression.   Not drinking enough water.   Not eating enough fiber-rich foods.   Stress or travel.   Lack of physical activity or exercise.   Ignoring  the urge to have a bowel movement.   Using laxatives too much.  SIGNS AND SYMPTOMS   Having fewer than three bowel movements a week.   Straining to have a bowel movement.   Having stools that are hard, dry, or larger than normal.   Feeling full or bloated.   Pain in the lower abdomen.   Not feeling relief after having a bowel movement.  DIAGNOSIS  Your health care provider will take a medical history and perform a physical exam. Further testing may be done for  severe constipation. Some tests may include:  A barium enema X-ray to examine your rectum, colon, and, sometimes, your small intestine.   A sigmoidoscopy to examine your lower colon.   A colonoscopy to examine your entire colon. TREATMENT  Treatment will depend on the severity of your constipation and what is causing it. Some dietary treatments include drinking more fluids and eating more fiber-rich foods. Lifestyle treatments may include regular exercise. If these diet and lifestyle recommendations do not help, your health care provider may recommend taking over-the-counter laxative medicines to help you have bowel movements. Prescription medicines may be prescribed if over-the-counter medicines do not work.  HOME CARE INSTRUCTIONS   Eat foods that have a lot of fiber, such as fruits, vegetables, whole grains, and beans.  Limit foods high in fat and processed sugars, such as french fries, hamburgers, cookies, candies, and soda.   A fiber supplement may be added to your diet if you cannot get enough fiber from foods.   Drink enough fluids to keep your urine clear or pale yellow.   Exercise regularly or as directed by your health care provider.   Go to the restroom when you have the urge to go. Do not hold it.   Only take over-the-counter or prescription medicines as directed by your health care provider. Do not take other medicines for constipation without talking to your health care provider first.  [place] IF:   You have bright red blood in your stool.   Your constipation lasts for more than 4 days or gets worse.   You have abdominal or rectal pain.   You have thin, pencil-like stools.   You have unexplained weight loss. MAKE SURE YOU:   Understand these instructions.  Will watch your condition.  Will get help right away if you are not doing well or get worse.   This information is not intended to replace advice given to you by your health  care provider. Make sure you discuss any questions you have with your health care provider.   Document Released: 03/14/2004 Document Revised: 07/07/2014 Document Reviewed: 03/28/2013 Elsevier Interactive Patient Education 2016 [employer].   Diverticulosis Diverticulosis is the condition that develops when small pouches (diverticula) form in the wall of your colon. Your colon, or large intestine, is where water is absorbed and stool is formed. The pouches form when the inside layer of your colon pushes through weak spots in the outer layers of your colon. CAUSES  No one knows exactly what causes diverticulosis. RISK FACTORS  Being older than 1. Your risk for this condition increases with age. Diverticulosis is rare in people younger than 40 years. By age 108, almost everyone has it.  Eating a low-fiber diet.  Being frequently constipated.  Being overweight.  Not getting enough exercise.  Smoking.  Taking over-the-counter pain medicines, like aspirin and ibuprofen. SYMPTOMS  Most people with diverticulosis do not have symptoms. DIAGNOSIS  Because diverticulosis  often has no symptoms, health care providers often discover the condition during an exam for other colon problems. In many cases, a health care provider will diagnose diverticulosis while using a flexible scope to examine the colon (colonoscopy). TREATMENT  If you have never developed an infection related to diverticulosis, you may not need treatment. If you have had an infection before, treatment may include:  Eating more fruits, vegetables, and grains.  Taking a fiber supplement.  Taking a live bacteria supplement (probiotic).  Taking medicine to relax your colon. HOME CARE INSTRUCTIONS   Drink at least 6-8 glasses of water each day to prevent constipation.  Try not to strain when you have a bowel movement.  Keep all follow-up appointments. If you have had an infection before:  Increase the fiber in your  diet as directed by your health care provider or dietitian.  Take a dietary fiber supplement if your health care provider approves.  Only take medicines as directed by your health care provider. SEEK MEDICAL CARE IF:   You have abdominal pain.  You have bloating.  You have cramps.  You have not gone to the bathroom in 3 days. SEEK IMMEDIATE MEDICAL CARE IF:   Your pain gets worse.  Yourbloating becomes very bad.  You have a fever or chills, and your symptoms suddenly get worse.  You begin vomiting.  You have bowel movements that are bloody or black. MAKE SURE YOU:  Understand these instructions.  Will watch your condition.  Will get help right away if you are not doing well or get worse.   This information is not intended to replace advice given to you by your health care provider. Make sure you discuss any questions you have with your health care provider.   Document Released: 03/13/2004 Document Revised: 06/21/2013 Document Reviewed: 05/11/2013 Elsevier Interactive Patient Education [employer].

## 2015-10-31 ENCOUNTER — Encounter (HOSPITAL_COMMUNITY): Payer: Self-pay | Admitting: Internal Medicine

## 2015-10-31 NOTE — Telephone Encounter (Signed)
I explained to the patient that a claim has not been submitted and we would need to wait until we submit the claim for her tcs that she had yesterday.  She thanked me for my help and we ended the call

## 2015-10-31 NOTE — Telephone Encounter (Signed)
I spoke with the patient and she received a call from the [facility] stating she had a $275 copayment, however, she thought she should not have a copayment.

## 2015-11-05 DIAGNOSIS — E559 Vitamin D deficiency, unspecified: Secondary | ICD-10-CM | POA: Diagnosis not present

## 2015-11-05 DIAGNOSIS — Z1159 Encounter for screening for other viral diseases: Secondary | ICD-10-CM | POA: Diagnosis not present

## 2015-11-05 DIAGNOSIS — E785 Hyperlipidemia, unspecified: Secondary | ICD-10-CM | POA: Diagnosis not present

## 2015-11-05 DIAGNOSIS — E119 Type 2 diabetes mellitus without complications: Secondary | ICD-10-CM | POA: Diagnosis not present

## 2015-11-06 LAB — COMPREHENSIVE METABOLIC PANEL
ALK PHOS: 36 U/L (ref 33–130)
ALT: 14 U/L (ref 6–29)
AST: 19 U/L (ref 10–35)
Albumin: 4.1 g/dL (ref 3.6–5.1)
BUN: 13 mg/dL (ref 7–25)
CHLORIDE: 105 mmol/L (ref 98–110)
CO2: 29 mmol/L (ref 20–31)
Calcium: 9.2 mg/dL (ref 8.6–10.4)
Creat: 0.62 mg/dL (ref 0.50–0.99)
GLUCOSE: 90 mg/dL (ref 65–99)
POTASSIUM: 4 mmol/L (ref 3.5–5.3)
Sodium: 143 mmol/L (ref 135–146)
Total Bilirubin: 0.4 mg/dL (ref 0.2–1.2)
Total Protein: 6.4 g/dL (ref 6.1–8.1)

## 2015-11-06 LAB — CBC
HEMATOCRIT: 39.8 % (ref 35.0–45.0)
Hemoglobin: 12.3 g/dL (ref 11.7–15.5)
MCH: 27.9 pg (ref 27.0–33.0)
MCHC: 30.9 g/dL — ABNORMAL LOW (ref 32.0–36.0)
MCV: 90.2 fL (ref 80.0–100.0)
MPV: 11.1 fL (ref 7.5–12.5)
PLATELETS: 200 10*3/uL (ref 140–400)
RBC: 4.41 MIL/uL (ref 3.80–5.10)
RDW: 13.9 % (ref 11.0–15.0)
WBC: 4.9 10*3/uL (ref 3.8–10.8)

## 2015-11-06 LAB — LIPID PANEL
CHOL/HDL RATIO: 2.2 ratio (ref <=5.0)
Cholesterol: 150 mg/dL (ref 125–200)
HDL: 67 mg/dL (ref >=46)
LDL Cholesterol: 72 mg/dL (ref <130)
Triglycerides: 54 mg/dL (ref <150)
VLDL: 11 mg/dL (ref <30)

## 2015-11-06 LAB — HIV ANTIBODY (ROUTINE TESTING W REFLEX): HIV 1&2 Ab, 4th Generation: NONREACTIVE

## 2015-11-06 LAB — HEPATITIS C ANTIBODY: HCV Ab: NEGATIVE

## 2015-11-06 LAB — VITAMIN D 25 HYDROXY (VIT D DEFICIENCY, FRACTURES): Vit D, 25-Hydroxy: 45 ng/mL (ref 30–100)

## 2015-11-08 ENCOUNTER — Telehealth: Payer: Self-pay

## 2015-11-08 NOTE — Telephone Encounter (Signed)
Pt called and states that she needs a RX for the 137mcg states that she feels that the 264mcg will be to hard on her. States that teh 145 samples are doing great

## 2015-11-12 MED ORDER — LINACLOTIDE 145 MCG PO CAPS
145.0000 ug | ORAL_CAPSULE | Freq: Every day | ORAL | Status: DC
Start: 2015-11-12 — End: 2016-02-07

## 2015-11-12 NOTE — Telephone Encounter (Signed)
Completed.

## 2015-11-13 ENCOUNTER — Ambulatory Visit (INDEPENDENT_AMBULATORY_CARE_PROVIDER_SITE_OTHER): Payer: Medicare Other | Admitting: Family Medicine

## 2015-11-13 ENCOUNTER — Other Ambulatory Visit: Payer: Self-pay

## 2015-11-13 ENCOUNTER — Encounter: Payer: Self-pay | Admitting: Family Medicine

## 2015-11-13 VITALS — BP 118/80 | HR 75 | Resp 16 | Ht 67.0 in | Wt 119.0 lb

## 2015-11-13 DIAGNOSIS — Z0000 Encounter for general adult medical examination without abnormal findings: Secondary | ICD-10-CM | POA: Diagnosis not present

## 2015-11-13 DIAGNOSIS — M1711 Unilateral primary osteoarthritis, right knee: Secondary | ICD-10-CM

## 2015-11-13 DIAGNOSIS — E119 Type 2 diabetes mellitus without complications: Secondary | ICD-10-CM

## 2015-11-13 LAB — POCT URINALYSIS DIPSTICK
BILIRUBIN UA: NEGATIVE
GLUCOSE UA: NEGATIVE
KETONES UA: NEGATIVE
Leukocytes, UA: NEGATIVE
Nitrite, UA: NEGATIVE
Protein, UA: NEGATIVE
SPEC GRAV UA: 1.01
Urobilinogen, UA: 0.2
pH, UA: 6

## 2015-11-13 NOTE — Assessment & Plan Note (Signed)

## 2015-11-13 NOTE — Patient Instructions (Signed)
Annual physical exam in 5.5 month, call if you need me sooner  Excellent labs  Commit to daily exercise and improved sleep  Keep house clutter free to reduce falls  Fall Prevention in the Home  Falls can cause injuries. They can happen to people of all ages. There are many things you can do to make your home safe and to help prevent falls.  WHAT CAN I DO ON THE OUTSIDE OF MY HOME?  Regularly fix the edges of walkways and driveways and fix any cracks.  Remove anything that might make you trip as you walk through a door, such as a raised step or threshold.  Trim any bushes or trees on the path to your home.  Use bright outdoor lighting.  Clear any walking paths of anything that might make someone trip, such as rocks or tools.  Regularly check to see if handrails are loose or broken. Make sure that both sides of any steps have handrails.  Any raised decks and porches should have guardrails on the edges.  Have any leaves, snow, or ice cleared regularly.  Use sand or salt on walking paths during winter.  Clean up any spills in your garage right away. This includes oil or grease spills. WHAT CAN I DO IN THE BATHROOM?   Use night lights.  Install grab bars by the toilet and in the tub and shower. Do not use towel bars as grab bars.  Use non-skid mats or decals in the tub or shower.  If you need to sit down in the shower, use a plastic, non-slip stool.  Keep the floor dry. Clean up any water that spills on the floor as soon as it happens.  Remove soap buildup in the tub or shower regularly.  Attach bath mats securely with double-sided non-slip rug tape.  Do not have throw rugs and other things on the floor that can make you trip. WHAT CAN I DO IN THE BEDROOM?  Use night lights.  Make sure that you have a light by your bed that is easy to reach.  Do not use any sheets or blankets that are too big for your bed. They should not hang down onto the floor.  Have a firm chair  that has side arms. You can use this for support while you get dressed.  Do not have throw rugs and other things on the floor that can make you trip. WHAT CAN I DO IN THE KITCHEN?  Clean up any spills right away.  Avoid walking on wet floors.  Keep items that you use a lot in easy-to-reach places.  If you need to reach something above you, use a strong step stool that has a grab bar.  Keep electrical cords out of the way.  Do not use floor polish or wax that makes floors slippery. If you must use wax, use non-skid floor wax.  Do not have throw rugs and other things on the floor that can make you trip. WHAT CAN I DO WITH MY STAIRS?  Do not leave any items on the stairs.  Make sure that there are handrails on both sides of the stairs and use them. Fix handrails that are broken or loose. Make sure that handrails are as long as the stairways.  Check any carpeting to make sure that it is firmly attached to the stairs. Fix any carpet that is loose or worn.  Avoid having throw rugs at the top or bottom of the stairs. If you do  have throw rugs, attach them to the floor with carpet tape.  Make sure that you have a light switch at the top of the stairs and the bottom of the stairs. If you do not have them, ask someone to add them for you. WHAT ELSE CAN I DO TO HELP PREVENT FALLS?  Wear shoes that:  Do not have high heels.  Have rubber bottoms.  Are comfortable and fit you well.  Are closed at the toe. Do not wear sandals.  If you use a stepladder:  Make sure that it is fully opened. Do not climb a closed stepladder.  Make sure that both sides of the stepladder are locked into place.  Ask someone to hold it for you, if possible.  Clearly mark and make sure that you can see:  Any grab bars or handrails.  First and last steps.  Where the edge of each step is.  Use tools that help you move around (mobility aids) if they are needed. These  include:  Canes.  Walkers.  Scooters.  Crutches.  Turn on the lights when you go into a dark area. Replace any light bulbs as soon as they burn out.  Set up your furniture so you have a clear path. Avoid moving your furniture around.  If any of your floors are uneven, fix them.  If there are any pets around you, be aware of where they are.  Review your medicines with your doctor. Some medicines can make you feel dizzy. This can increase your chance of falling. Ask your doctor what other things that you can do to help prevent falls.   This information is not intended to replace advice given to you by your health care provider. Make sure you discuss any questions you have with your health care provider.   Document Released: 04/12/2009 Document Revised: 10/31/2014 Document Reviewed: 07/21/2014 Elsevier Interactive Patient Education 2016 [place] are referred to Dr of your choice to eval for knee replacement

## 2015-11-13 NOTE — Progress Notes (Signed)
Subjective:    Patient ID: Tamara Lam, female    DOB: 11-Apr-1951, 65 y.o.   MRN: [id number]  HPI Preventive Screening-Counseling & Management   Patient present here today for a welcome to medicare  Visit. C/o increased right knee and instability and near falls, needs and is ready for replacement   Current Problems (verified)   Medications Prior to Visit Allergies (verified)   PAST HISTORY  Family History (verfied)  Social History widowed with 3 children. Never smoker, not working currently    Risk Factors  Current exercise habits:  Silver sneakers classes 4 days a week , limited walking due to arthritis in knee Dietary issues discussed: heart healthy diet, encouraged to eat more vegetables and limit fried foods and carbs    Cardiac risk factors:   Depression Screen  (Note: if answer to either of the following is "Yes", a more complete depression screening is indicated)   Over the past two weeks, have you felt down, depressed or hopeless? sometimes Over the past two weeks, have you felt little interest or pleasure in doing things? sometimes  Have you lost interest or pleasure in daily life? No  Do you often feel hopeless? No  Do you cry easily over simple problems? No   Activities of Daily Living  In your present state of health, do you have any difficulty performing the following activities?  Driving?: No Managing money?: No Feeding yourself?:No Getting from bed to chair?:No Climbing a flight of stairs?:yes , right knee pain and instability Preparing food and eating?:No Bathing or showering?:No Getting dressed?:No Getting to the toilet?:No Using the toilet?:No Moving around from place to place?: yes at times, right knee buckles  Fall Risk Assessment In the past year have you fallen or had a near fall?:yes , approx 4 buckles in past 12 month Are you currently taking any medications that make you dizzy?:No   Hearing Difficulties: No Do you often ask  people to speak up or repeat themselves?:No Do you experience ringing or noises in your ears?:No Do you have difficulty understanding soft or whispered voices?:No  Cognitive Testing  Alert? Yes Normal Appearance?Yes  Oriented to person? Yes Place? Yes  Time? Yes  Displays appropriate judgment?Yes  Can read the correct time from a watch face? yes Are you having problems remembering things? No   Advanced Directives have been discussed with the patient?Yes, states she has a living will  , son, Tamara Lam is her HOPA   List the Names of Other Physician/Practitioners you currently use:  Dr nida (endo)  Dr Gala Romney (GI )    Indicate any recent Medical Services you may have received from other than [facility] providers in the past year (date may be approximate).   Assessment:    Annual Wellness Exam   Plan:    .  Medicare Attestation  I have personally reviewed:  The patient's medical and social history  Their use of alcohol, tobacco or illicit drugs  Their current medications and supplements  The patient's functional ability including ADLs,fall risks, home safety risks, cognitive, and hearing and visual impairment  Diet and physical activities  Evidence for depression or mood disorders  The patient's weight, height, BMI, and visual acuity have been recorded in the chart. I have made referrals, counseling, and provided education to the patient based on review of the above and I have provided the patient with a written personalized care plan for preventive services.      Review of Systems  Objective:   Physical Exam  BP 118/80 mmHg  Pulse 75  Resp 16  Ht 5\' 7"  (1.702 m)  Wt 119 lb (53.978 kg)  BMI 18.63 kg/m2  SpO2 99%   EKG: NSR, no ischemia , no lVH  UA: negative for protein, sugar and infection  Endo: diabetic foot exam normal    Assessment & Plan:  Welcome to Medicare preventive visit Annual exam as documented. Counseling done  re healthy lifestyle  involving commitment to 150 minutes exercise per week, heart healthy diet, and attaining healthy weight.The importance of adequate sleep also discussed. Regular seat belt use and home safety, is also discussed. Changes in health habits are decided on by the patient with goals and time frames  set for achieving them. Immunization and cancer screening needs are specifically addressed at this visit.   Degenerative arthritis of knee Increased pain and instability noted in right knee, feels ready for replacement , which has been recommended in the past, will refer to ortho of her choice

## 2015-11-14 ENCOUNTER — Telehealth: Payer: Self-pay | Admitting: Internal Medicine

## 2015-11-14 LAB — MICROALBUMIN / CREATININE URINE RATIO
Creatinine, Urine: 54 mg/dL (ref 20–320)
Microalb Creat Ratio: 7 mcg/mg creat (ref <30)
Microalb, Ur: 0.4 mg/dL

## 2015-11-14 NOTE — Telephone Encounter (Signed)
Pt called to follow up on a Linzess prescription. I told her the prescription had been sent to [facility]  90 day supply with 3 refills. She asking for [place] to fax it to [place] so she will not have to wait for it to come in the mail. Can we just give her samples to hold her until it is delivered to her? Please advise  [phone]

## 2015-11-15 NOTE — Assessment & Plan Note (Signed)
Increased pain and instability noted in right knee, feels ready for replacement , which has been recommended in the past, will refer to ortho of her choice

## 2015-11-20 NOTE — Telephone Encounter (Signed)
#  2 boxes of linzess are at the front desk. Tried to call pt- NA-LMOM that samples were here for her.

## 2015-12-03 ENCOUNTER — Other Ambulatory Visit: Payer: Self-pay | Admitting: Family Medicine

## 2015-12-07 ENCOUNTER — Other Ambulatory Visit: Payer: Self-pay | Admitting: Family Medicine

## 2015-12-24 ENCOUNTER — Ambulatory Visit (INDEPENDENT_AMBULATORY_CARE_PROVIDER_SITE_OTHER): Payer: Medicare Other | Admitting: Family Medicine

## 2015-12-24 ENCOUNTER — Encounter: Payer: Self-pay | Admitting: Family Medicine

## 2015-12-24 VITALS — BP 116/64 | HR 66 | Resp 18 | Ht 67.0 in | Wt 120.1 lb

## 2015-12-24 DIAGNOSIS — L309 Dermatitis, unspecified: Secondary | ICD-10-CM | POA: Diagnosis not present

## 2015-12-24 MED ORDER — CEPHALEXIN 500 MG PO CAPS: 500.0000 mg | ORAL_CAPSULE | Freq: Two times a day (BID) | ORAL | Status: DC

## 2015-12-24 MED ORDER — PREDNISONE 5 MG (21) PO TBPK: 5.0000 mg | ORAL_TABLET | ORAL | Status: DC

## 2015-12-24 MED ORDER — METHYLPREDNISOLONE ACETATE 80 MG/ML IJ SUSP
80.0000 mg | Freq: Once | INTRAMUSCULAR | Status: AC
  Administered 2015-12-24: 80 mg via INTRAMUSCULAR

## 2015-12-24 NOTE — Assessment & Plan Note (Addendum)
Depo medrol 80 mg iM in office, then pred dose pack, keflex for bacterial superinfection, pt to call if not improved or worsening in next 1 week for dermatology eval

## 2015-12-24 NOTE — Progress Notes (Signed)
   Subjective:    Patient ID: Tamara Lam, female    DOB: Aug 17, 1950, 65 y.o.   MRN: [id number]  HPI  2 week h/o rash on arms and neck and face, was in her garden 2 weeks ago, no fever, chills or pus draining , no know allergy to plant, no insect bite noted Denies cough , wheeze , or difficulty breathing  Review of Systems See HPI Denies sinus  pressure, ear pain , sore throat or drainage Denies chest pains, palpitations and leg swelling Denies abdominal pain, nausea, vomiting,diarrhea or constipation.   Denies dysuria, frequency, hesitancy or incontinence. Denies joint pain, swelling and limitation in mobility. Denies headaches, seizures, numbness, or tingling. Denies depression, anxiety or insomnia.       Objective:   Physical Exam BP 116/64 mmHg  Pulse 66  Resp 18  Ht 5\' 7"  (1.702 m)  Wt 120 lb 1.9 oz (54.486 kg)  BMI 18.81 kg/m2  SpO2 98% Patient alert and oriented and in no cardiopulmonary distress.  HEENT: No facial asymmetry, EOMI,   oropharynx pink and moist.  Neck supple no JVD, no mass.  Chest: Clear to auscultation bilaterally.  CVS: S1, S2 no murmurs, no S3.Regular rate.  ABD: Soft non tender.   Ext: No edema  MS: Adequate ROM spine, shoulders, hips and knees. Skin:macular rash in patches on upper extremities, neck and face, erythematous, , no visible puncture sites,  No rash on back, some lesions have crusting consistent with bacterial superinfection  Psych: Good eye contact, normal affect. Memory intact not anxious or depressed appearing.  CNS: CN 2-12 intact, power,  normal throughout.no focal deficits noted.        Assessment & Plan:  Dermatitis Depo medrol 80 mg iM in office, then pred dose pack, keflex for bacterial superinfection, pt to call if not improved or worsening in next 1 week for dermatology eval

## 2015-12-24 NOTE — Patient Instructions (Addendum)
F/u as before  Call if you need  Me sooner  Pls call by end of week IF not improving , i will refer you to dermatology  Depo medrol in office , prednisone and keflex sent to your local pharmacy   I believe your rash is due to something your skin has been exposed to, eg poison oak, however since linzesse is new pls stop and get in touch with your GI Doc about this

## 2016-01-25 DIAGNOSIS — M1711 Unilateral primary osteoarthritis, right knee: Secondary | ICD-10-CM | POA: Diagnosis not present

## 2016-01-30 ENCOUNTER — Ambulatory Visit (INDEPENDENT_AMBULATORY_CARE_PROVIDER_SITE_OTHER): Payer: Medicare Other | Admitting: Gastroenterology

## 2016-01-30 ENCOUNTER — Encounter: Payer: Self-pay | Admitting: Gastroenterology

## 2016-01-30 VITALS — BP 122/73 | HR 85 | Temp 98.2°F | Ht 67.0 in | Wt 121.2 lb

## 2016-01-30 DIAGNOSIS — K5909 Other constipation: Secondary | ICD-10-CM | POA: Diagnosis not present

## 2016-01-30 NOTE — Progress Notes (Signed)
cc'ed to pcp °

## 2016-01-30 NOTE — Progress Notes (Signed)
Referring Provider: Fayrene Helper, MD Primary Care Physician:  Tula Nakayama, MD  Primary GI: Dr. Gala Romney   Chief Complaint  Patient presents with  . Follow-up    some constipation and some problems with her stomach    HPI:   Tamara Lam is a 65 y.o. female presenting today with a history of chronic constipation, recent colonoscopy completed with personal history of colon polyps and family history of colon polyps. Due for surveillance again in 2022.   Has been taking Linzess 145 mcg once each day. Did well for awhile but now feels more abdominal discomfort correlating with less productive BMs. Feels she needs something stronger. Interested in trying a higher dose of Linzess. No rectal bleeding.   Past Medical History:  Diagnosis Date  . Anxiety 1980's   . Chronic constipation   . Chronic nausea   . Diabetes mellitus without complication ([place])   . Glaucoma 2004   Dr. Venetia Maxon, laser to left eye Sept 2011  . Helicobacter pylori gastritis 04/2004   last EGD   . Hematochezia   . Hx of colonoscopy 08/2005   Dr. Gala Romney / hemorrhoids   . Thyroid disease   . Weight loss     Past Surgical History:  Procedure Laterality Date  . arthoscopic surgery on right knee  2007  . CHOLECYSTECTOMY  1990's  . COLONOSCOPY  2011   Dr. Gala Romney: anal canal/internal hemorrhoids, dimintuive rectosigmoid polyp (polypoid rectal mucosa), few sigmoid diverticula   . COLONOSCOPY N/A 10/30/2015   Dr. Gala Romney: diverticulosis, hemorrhoids, surveillance in 2022   . VESICOVAGINAL FISTULA CLOSURE W/ TAH  1989    Current Outpatient Prescriptions  Medication Sig Dispense Refill  . alendronate (FOSAMAX) 70 MG tablet TAKE 1 TABLET ONCE WEEKLY  IN THE MORNING 30 MINUTES  BEFORE EATING WITH AN 8OZ  GLASS OF WATER. SIT UP FOR  30 MINUTES AFTER TAKING. 12 tablet 1  . aspirin EC 81 MG tablet Take 1 tablet (81 mg total) by mouth daily. 150 tablet 2  . Calcium Carbonate-Vit D-Min (CALCIUM 1200) 1200-1000 MG-UNIT  CHEW Chew 1 each by mouth daily.    . diazepam (VALIUM) 5 MG tablet TAKE 1 TABLET BY MOUTH DAILY AS NEEDED FOR ANXIETY. 90 tablet 0  . linaclotide (LINZESS) 145 MCG CAPS capsule Take 1 capsule (145 mcg total) by mouth daily before breakfast. 90 capsule 3  . Multiple Vitamins-Minerals (CENTRUM SILVER PO) Take 1 tablet by mouth daily.     . simvastatin (ZOCOR) 20 MG tablet TAKE TABLET BY MOUTH AT  BEDTIME FOR CHOLESTEROL. 90 tablet 1  . travoprost, benzalkonium, (TRAVATAN) 0.004 % ophthalmic solution Place 1 drop into both eyes at bedtime.       No current facility-administered medications for this visit.     Allergies as of 01/30/2016  . (No Known Allergies)    Family History  Problem Relation Age of Onset  . Hypertension Mother   . Glaucoma Mother   . Diverticulosis Mother   . Heart disease Mother   . Lung cancer Father   . Mental illness Sister   . Cancer Sister   . Colon polyps Son   . Colon cancer Neg Hx     Social History   Social History  . Marital status: Widowed    Spouse name: N/A  . Number of children: N/A  . Years of education: N/A   Occupational History  . homemaker  Unemployed   Social History Main Topics  . Smoking  status: Never Smoker  . Smokeless tobacco: None     Comment: Never smoked  . Alcohol use No  . Drug use: No  . Sexual activity: Not Currently   Other Topics Concern  . None   Social History Narrative  . None    Review of Systems: Negative unless mentioned in HPI   Physical Exam: BP 122/73   Pulse 85   Temp 98.2 F (36.8 C) (Oral)   Ht 5\' 7"  (1.702 m)   Wt 121 lb 3.2 oz (55 kg)   BMI 18.98 kg/m  General:   Alert and oriented. No distress noted. Pleasant and cooperative.  Head:  Normocephalic and atraumatic. Eyes:  Conjuctiva clear without scleral icterus. Abdomen:  +BS, soft, non-tender and non-distended. No rebound or guarding. No HSM or masses noted. Extremities:  Without edema. Neurologic:  Alert and  oriented x4;   grossly normal neurologically. Psych:  Alert and cooperative. Normal mood and affect.

## 2016-01-30 NOTE — Assessment & Plan Note (Signed)
Increase Linzess to 290 mcg daily. Samples provided. Patient to call if she would like a prescription. Return in 6 months.

## 2016-01-30 NOTE — Patient Instructions (Signed)
Let's try Linzess 290 mcg once each morning, 30 minutes before breakfast. I have provided samples. Let me know if you would like a prescription!  We will see you in 6 months!

## 2016-02-07 ENCOUNTER — Telehealth: Payer: Self-pay | Admitting: Internal Medicine

## 2016-02-07 MED ORDER — LINACLOTIDE 290 MCG PO CAPS: 290.0000 ug | ORAL_CAPSULE | Freq: Every day | ORAL | 2 refills | Status: DC

## 2016-02-07 MED ORDER — LINACLOTIDE 290 MCG PO CAPS: 290.0000 ug | ORAL_CAPSULE | Freq: Every day | ORAL | 1 refills | Status: DC

## 2016-02-07 NOTE — Telephone Encounter (Signed)
linzess 290 is working for the patient and  Needs a prescription (mail) [facility] 90 day supply. Any question [phone]

## 2016-02-07 NOTE — Telephone Encounter (Signed)
Please notify the patient refill sent to pharmacy [facility] Mail order) as requested.

## 2016-02-07 NOTE — Telephone Encounter (Signed)
Routing to the refill box. 

## 2016-02-11 ENCOUNTER — Ambulatory Visit (INDEPENDENT_AMBULATORY_CARE_PROVIDER_SITE_OTHER): Payer: Medicare Other | Admitting: Family Medicine

## 2016-02-11 ENCOUNTER — Encounter: Payer: Self-pay | Admitting: Family Medicine

## 2016-02-11 VITALS — BP 102/60 | HR 78 | Resp 18 | Ht 67.0 in | Wt 121.0 lb

## 2016-02-11 DIAGNOSIS — E785 Hyperlipidemia, unspecified: Secondary | ICD-10-CM

## 2016-02-12 NOTE — Telephone Encounter (Signed)
Pt aware.

## 2016-02-16 ENCOUNTER — Telehealth: Payer: Self-pay | Admitting: Family Medicine

## 2016-02-16 DIAGNOSIS — Z01818 Encounter for other preprocedural examination: Secondary | ICD-10-CM

## 2016-02-16 NOTE — Progress Notes (Signed)
Appointment cancelled as surgery is in next 2 months, she is rescheduled for clearance nearer date of surgery Her chronic health conditions are stable and well controlled.

## 2016-02-16 NOTE — Telephone Encounter (Signed)
pls contact pt, she needs fasting labs 1 week prior to her Nov appt for her annual exam and this will also be an appt for medical clearance for surgery Needs cBC, fastig lipid, cmp and eGFR, hBA1C, TSH, vit D first week in ,   , also CXR  If she is getting labds for Dr Dorris Fetch, before or around that time remove the TSH please as he is treating her thyroid disease  Pls explain to pt the plan and ,mail her the orders ??? pls ask

## 2016-02-29 NOTE — Telephone Encounter (Signed)
Patient aware and lab order mailed  

## 2016-02-29 NOTE — Addendum Note (Signed)
Addended by: Eual Fines on: 02/29/2016 01:40 PM   Modules accepted: Orders

## 2016-03-06 ENCOUNTER — Telehealth: Payer: Self-pay | Admitting: Internal Medicine

## 2016-03-06 NOTE — Telephone Encounter (Signed)
Pt seen AB on 8/2 and was put on Linzess 145. Then she was put on Linzess 290. She said since Monday she hasn't felt to good. Feeling gassy, bloated and abdominal pain. She said this morning she wiped a little bit of light blood and mucous after having a small BM. Please advise and call [phone]

## 2016-03-07 MED ORDER — HYDROCORTISONE 2.5 % RE CREA: 1.0000 "application " | TOPICAL_CREAM | Freq: Two times a day (BID) | RECTAL | 1 refills | Status: DC

## 2016-03-07 NOTE — Telephone Encounter (Signed)
Spoke with the pt and she had noticed on Monday that she had a lot of bloating and gas. By Wednesday she was having bloating, gas and nausea and a headache and she noticed a small about of stool with a little bit of pinkish blood and mucous. Has not had a bm today. She  Has not had any more nausea since Wednesday but has some gas pains in her abd. Bloating is not as bad today.no bm since Wednesday, but she is not feeling constipated today. She wants to know what she can do for the gas and bloating.

## 2016-03-07 NOTE — Telephone Encounter (Signed)
Pt is aware.  

## 2016-03-07 NOTE — Addendum Note (Signed)
Addended by: Annitta Needs on: 03/07/2016 12:08 PM   Modules accepted: Orders

## 2016-03-07 NOTE — Telephone Encounter (Signed)
I have sent in cream to use twice a day. She has a history of hemorrhoids.   Would keep taking Linzess 290 mcg daily if she isn't already. Could take probiotic daily in addition. Gas-x or beano OTC would be ok. If she gets in a bind where she needs to have a BM over the weekend, a dose of Miralax in addition to [place] it not an issue.

## 2016-03-31 DIAGNOSIS — H25813 Combined forms of age-related cataract, bilateral: Secondary | ICD-10-CM | POA: Diagnosis not present

## 2016-03-31 DIAGNOSIS — H401131 Primary open-angle glaucoma, bilateral, mild stage: Secondary | ICD-10-CM | POA: Diagnosis not present

## 2016-04-07 ENCOUNTER — Ambulatory Visit: Payer: Self-pay | Admitting: Orthopedic Surgery

## 2016-04-14 ENCOUNTER — Encounter: Payer: Medicare Other | Admitting: Family Medicine

## 2016-04-28 ENCOUNTER — Ambulatory Visit (INDEPENDENT_AMBULATORY_CARE_PROVIDER_SITE_OTHER): Payer: Medicare Other

## 2016-04-28 DIAGNOSIS — Z23 Encounter for immunization: Secondary | ICD-10-CM | POA: Diagnosis not present

## 2016-05-05 ENCOUNTER — Ambulatory Visit (HOSPITAL_COMMUNITY)
Admission: RE | Admit: 2016-05-05 | Discharge: 2016-05-05 | Disposition: A | Discharge status: Home / Self Care | Payer: Medicare Other | Source: Ambulatory Visit | Attending: Family Medicine | Admitting: Family Medicine

## 2016-05-05 DIAGNOSIS — R918 Other nonspecific abnormal finding of lung field: Secondary | ICD-10-CM | POA: Insufficient documentation

## 2016-05-05 DIAGNOSIS — E785 Hyperlipidemia, unspecified: Secondary | ICD-10-CM | POA: Diagnosis not present

## 2016-05-05 DIAGNOSIS — E559 Vitamin D deficiency, unspecified: Secondary | ICD-10-CM | POA: Diagnosis not present

## 2016-05-05 DIAGNOSIS — Z01818 Encounter for other preprocedural examination: Secondary | ICD-10-CM | POA: Diagnosis not present

## 2016-05-05 DIAGNOSIS — E119 Type 2 diabetes mellitus without complications: Secondary | ICD-10-CM | POA: Diagnosis not present

## 2016-05-06 DIAGNOSIS — L219 Seborrheic dermatitis, unspecified: Secondary | ICD-10-CM | POA: Diagnosis not present

## 2016-05-06 DIAGNOSIS — D485 Neoplasm of uncertain behavior of skin: Secondary | ICD-10-CM | POA: Diagnosis not present

## 2016-05-06 DIAGNOSIS — L309 Dermatitis, unspecified: Secondary | ICD-10-CM | POA: Diagnosis not present

## 2016-05-06 LAB — CBC
HCT: 41.9 % (ref 35.0–45.0)
Hemoglobin: 12.9 g/dL (ref 11.7–15.5)
MCH: 28 pg (ref 27.0–33.0)
MCHC: 30.8 g/dL — ABNORMAL LOW (ref 32.0–36.0)
MCV: 90.9 fL (ref 80.0–100.0)
MPV: 10.6 fL (ref 7.5–12.5)
PLATELETS: 220 10*3/uL (ref 140–400)
RBC: 4.61 MIL/uL (ref 3.80–5.10)
RDW: 14.7 % (ref 11.0–15.0)
WBC: 5.2 10*3/uL (ref 3.8–10.8)

## 2016-05-06 LAB — HEMOGLOBIN A1C
Hgb A1c MFr Bld: 5.6 % (ref <5.7)
Mean Plasma Glucose: 114 mg/dL

## 2016-05-06 LAB — LIPID PANEL
CHOL/HDL RATIO: 2.3 ratio (ref <5.0)
Cholesterol: 162 mg/dL (ref <200)
HDL: 69 mg/dL (ref >50)
LDL Cholesterol: 80 mg/dL
Triglycerides: 67 mg/dL (ref <150)
VLDL: 13 mg/dL (ref <30)

## 2016-05-06 LAB — COMPLETE METABOLIC PANEL WITH GFR
ALBUMIN: 4.4 g/dL (ref 3.6–5.1)
ALK PHOS: 40 U/L (ref 33–130)
ALT: 15 U/L (ref 6–29)
AST: 23 U/L (ref 10–35)
BUN: 13 mg/dL (ref 7–25)
CHLORIDE: 103 mmol/L (ref 98–110)
CO2: 32 mmol/L — ABNORMAL HIGH (ref 20–31)
Calcium: 9.8 mg/dL (ref 8.6–10.4)
Creat: 0.69 mg/dL (ref 0.50–0.99)
GFR, Est African American: >89 mL/min (ref >=60)
GFR, Est Non African American: >89 mL/min (ref >=60)
GLUCOSE: 89 mg/dL (ref 65–99)
POTASSIUM: 4 mmol/L (ref 3.5–5.3)
Sodium: 142 mmol/L (ref 135–146)
Total Bilirubin: 0.5 mg/dL (ref 0.2–1.2)
Total Protein: 7 g/dL (ref 6.1–8.1)

## 2016-05-06 LAB — VITAMIN D 25 HYDROXY (VIT D DEFICIENCY, FRACTURES): Vit D, 25-Hydroxy: 45 ng/mL (ref 30–100)

## 2016-05-12 ENCOUNTER — Ambulatory Visit (INDEPENDENT_AMBULATORY_CARE_PROVIDER_SITE_OTHER): Payer: Medicare Other | Admitting: Family Medicine

## 2016-05-12 ENCOUNTER — Other Ambulatory Visit: Payer: Self-pay

## 2016-05-12 VITALS — BP 120/80 | HR 88 | Resp 16 | Ht 67.0 in | Wt 125.0 lb

## 2016-05-12 DIAGNOSIS — Z1211 Encounter for screening for malignant neoplasm of colon: Secondary | ICD-10-CM | POA: Diagnosis not present

## 2016-05-12 DIAGNOSIS — Z23 Encounter for immunization: Secondary | ICD-10-CM

## 2016-05-12 DIAGNOSIS — Z0000 Encounter for general adult medical examination without abnormal findings: Secondary | ICD-10-CM

## 2016-05-12 DIAGNOSIS — M79631 Pain in right forearm: Secondary | ICD-10-CM

## 2016-05-12 DIAGNOSIS — R9389 Abnormal findings on diagnostic imaging of other specified body structures: Secondary | ICD-10-CM

## 2016-05-12 DIAGNOSIS — Z01818 Encounter for other preprocedural examination: Secondary | ICD-10-CM | POA: Diagnosis not present

## 2016-05-12 DIAGNOSIS — R938 Abnormal findings on diagnostic imaging of other specified body structures: Secondary | ICD-10-CM

## 2016-05-12 LAB — POCT URINALYSIS DIPSTICK
Bilirubin, UA: NEGATIVE
Blood, UA: NEGATIVE
GLUCOSE UA: NEGATIVE
Ketones, UA: NEGATIVE
Leukocytes, UA: NEGATIVE
NITRITE UA: NEGATIVE
Protein, UA: NEGATIVE
Spec Grav, UA: 1.01
UROBILINOGEN UA: 0.2
pH, UA: 5.5

## 2016-05-12 LAB — POC HEMOCCULT BLD/STL (OFFICE/1-CARD/DIAGNOSTIC): Fecal Occult Blood, POC: NEGATIVE

## 2016-05-12 NOTE — Patient Instructions (Addendum)
Annual wellness by end  January   F/U with MD 5 months   Pneumonia 23 vaccine today  You are referred  For lung function test, if needed, and to see Dr Luan Pulling due to abnormal CXR prior to surgery  EKG in office today and urinalysis for surgery

## 2016-05-15 ENCOUNTER — Encounter: Payer: Self-pay | Admitting: Family Medicine

## 2016-05-15 ENCOUNTER — Ambulatory Visit (HOSPITAL_COMMUNITY)
Admission: RE | Admit: 2016-05-15 | Discharge: 2016-05-15 | Disposition: A | Discharge status: Home / Self Care | Payer: Medicare Other | Source: Ambulatory Visit | Attending: Family Medicine | Admitting: Family Medicine

## 2016-05-15 DIAGNOSIS — Z0000 Encounter for general adult medical examination without abnormal findings: Secondary | ICD-10-CM | POA: Insufficient documentation

## 2016-05-15 DIAGNOSIS — R938 Abnormal findings on diagnostic imaging of other specified body structures: Secondary | ICD-10-CM | POA: Diagnosis not present

## 2016-05-15 DIAGNOSIS — Z1211 Encounter for screening for malignant neoplasm of colon: Secondary | ICD-10-CM | POA: Insufficient documentation

## 2016-05-15 DIAGNOSIS — M79631 Pain in right forearm: Secondary | ICD-10-CM

## 2016-05-15 DIAGNOSIS — Z01818 Encounter for other preprocedural examination: Secondary | ICD-10-CM | POA: Insufficient documentation

## 2016-05-15 NOTE — Assessment & Plan Note (Signed)

## 2016-05-15 NOTE — Progress Notes (Signed)
    Tamara Lam     MRN: [id number]      DOB: September 11, 1950  HPI: Patient is in for annual physical exam. Needs medical clearance for proposed right knee surgery Abnormal CXR with worsening hyperinflation x 1 year C/o right forearm pain, no trauma for past several weeks Recent labs,  are reviewed. Immunization is reviewed , and  updated if needed.   PE: BP 120/80   Pulse 88   Resp 16   Ht 5\' 7"  (1.702 m)   Wt 125 lb (56.7 kg)   SpO2 99%   BMI 19.58 kg/m   Pleasant  female, alert and oriented x 3, in no cardio-pulmonary distress. Afebrile. HEENT No facial trauma or asymetry. Sinuses non tender.  Extra occullar muscles intact, pupils equally reactive to light. External ears normal, tympanic membranes clear. Oropharynx moist, no exudate. Neck: supple, no adenopathy,JVD or thyromegaly.No bruits.  Chest: Clear to ascultation bilaterally.No crackles or wheezes. Non tender to palpation  Breast: No asymetry,no masses or lumps. No tenderness. No nipple discharge or inversion. No axillary or supraclavicular adenopathy  Cardiovascular system; Heart sounds normal,  S1 and  S2 ,no S3.  No murmur, or thrill. Apical beat not displaced Peripheral pulses normal.  Abdomen: Soft, non tender, no organomegaly or masses. No bruits. Bowel sounds normal. No guarding, tenderness or rebound.  Rectal:  Normal sphincter tone. No rectal mass. Guaiac negative stool.  GU: External genitalia normal female genitalia , normal female distribution of hair. No lesions. Urethral meatus normal in size, no  Prolapse, no lesions visibly  Present. Bladder non tender. Vagina pink and moist , with no visible lesions , discharge present . Adequate pelvic support no  cystocele or rectocele noted Uterus absent, no adnexal masses, no  adnexal tenderness.   Musculoskeletal exam: Full ROM of spine, hips , shoulders and knees. Localized area of tenderness distal aspect of rigth forearm, no abnormality  of overlying skin, no palpanble nodule/ lump No deformity ,swelling or crepitus noted. No muscle wasting or atrophy.   Neurologic: Cranial nerves 2 to 12 intact. Power, tone ,sensation and reflexes normal throughout. No disturbance in gait. No tremor.  Skin: Intact, no ulceration, erythema , scaling or rash noted. Pigmentation normal throughout  Psych; Normal mood and affect. Judgement and concentration normal   Assessment & Plan:  Annual physical exam Annual exam as documented. Counseling done  re healthy lifestyle involving commitment to 150 minutes exercise per week, heart healthy diet, and attaining healthy weight.The importance of adequate sleep also discussed. Regular seat belt use and home safety, is also discussed. Changes in health habits are decided on by the patient with goals and time frames  set for achieving them. Immunization and cancer screening needs are specifically addressed at this visit.   Preoperative general physical examination Exam normal EKG in office: NSR, no ischemia, no LVH Urinalysis : normal CXR; reports progressive hyperinflation, pt has no personal h/o nicotine use or significant 2nd hand smoke exposure, will get pulmonary evaluation, prior to medically clearing for knee surgery   Right forearm pain Several week h/o rigth forearm pain, no associated trauma, localized, x ray to evaluate bone, no skin or soft tissue abnormality in area of concern

## 2016-05-15 NOTE — Assessment & Plan Note (Signed)
Several week h/o rigth forearm pain, no associated trauma, localized, x ray to evaluate bone, no skin or soft tissue abnormality in area of concern

## 2016-05-15 NOTE — Assessment & Plan Note (Signed)
Exam normal EKG in office: NSR, no ischemia, no LVH Urinalysis : normal CXR; reports progressive hyperinflation, pt has no personal h/o nicotine use or significant 2nd hand smoke exposure, will get pulmonary evaluation, prior to medically clearing for knee surgery

## 2016-05-16 ENCOUNTER — Encounter (HOSPITAL_COMMUNITY): Payer: Self-pay

## 2016-05-16 ENCOUNTER — Telehealth: Payer: Self-pay | Admitting: Family Medicine

## 2016-05-16 ENCOUNTER — Encounter (HOSPITAL_COMMUNITY)
Admission: RE | Admit: 2016-05-16 | Discharge: 2016-05-16 | Disposition: A | Discharge status: Home / Self Care | Payer: Medicare Other | Source: Ambulatory Visit | Attending: Orthopedic Surgery | Admitting: Orthopedic Surgery

## 2016-05-16 DIAGNOSIS — Z0183 Encounter for blood typing: Secondary | ICD-10-CM | POA: Insufficient documentation

## 2016-05-16 DIAGNOSIS — Z01812 Encounter for preprocedural laboratory examination: Secondary | ICD-10-CM | POA: Diagnosis not present

## 2016-05-16 DIAGNOSIS — M1711 Unilateral primary osteoarthritis, right knee: Secondary | ICD-10-CM | POA: Insufficient documentation

## 2016-05-16 HISTORY — DX: Abnormal findings on diagnostic imaging of other specified body structures: R93.89

## 2016-05-16 HISTORY — DX: Unspecified osteoarthritis, unspecified site: M19.90

## 2016-05-16 LAB — PROTIME-INR
INR: 1.07
Prothrombin Time: 14 seconds (ref 11.4–15.2)

## 2016-05-16 LAB — URINALYSIS, ROUTINE W REFLEX MICROSCOPIC
Bilirubin Urine: NEGATIVE
Glucose, UA: NEGATIVE mg/dL
KETONES UR: NEGATIVE mg/dL
LEUKOCYTES UA: NEGATIVE
NITRITE: NEGATIVE
PROTEIN: NEGATIVE mg/dL
Specific Gravity, Urine: 1.009 (ref 1.005–1.030)
pH: 5.5 (ref 5.0–8.0)

## 2016-05-16 LAB — SURGICAL PCR SCREEN
MRSA, PCR: NEGATIVE
STAPHYLOCOCCUS AUREUS: NEGATIVE

## 2016-05-16 LAB — URINE MICROSCOPIC-ADD ON
BACTERIA UA: NONE SEEN
WBC, UA: NONE SEEN WBC/hpf (ref 0–5)

## 2016-05-16 LAB — APTT: aPTT: 29 seconds (ref 24–36)

## 2016-05-16 LAB — ABO/RH: ABO/RH(D): O POS

## 2016-05-16 NOTE — Pre-Procedure Instructions (Signed)
EKG 11'17,CXR 11'17, labs -CBC,CMP, Hgb A1C= 5.6, Urine -Epic.

## 2016-05-16 NOTE — Telephone Encounter (Signed)
[name] is calling back from Dr. Luan Pulling office stating Dr. Luan Pulling saw Tamara Lam yesterday 05/15/16 and ran a few test on her and she has been released for surgery.

## 2016-05-16 NOTE — Patient Instructions (Addendum)
Tamara Lam  05/16/2016   Your procedure is scheduled on: 05-26-16    Report to [facility] Main  Entrance take [facility]  elevators to 3rd floor to  [place] at   [place] AM.  Call this number if you have problems the morning of surgery [phone]   Remember: ONLY 1 PERSON MAY GO WITH YOU TO SHORT STAY TO GET  READY MORNING OF [place].  Do not eat food or drink liquids :After Midnight.     Take these medicines the morning of surgery with A SIP OF WATER: Tylenol -if need, eye drops-usual bedtime. Valium -if need.. DO NOT TAKE ANY DIABETIC MEDICATIONS DAY OF YOUR SURGERY                               You may not have any metal on your body including hair pins and              piercings  Do not wear jewelry, make-up, lotions, powders or perfumes, deodorant             Do not wear nail polish.  Do not shave  48 hours prior to surgery.              Men may shave face and neck.   Do not bring valuables to the hospital. [place].  Contacts, dentures or bridgework may not be worn into surgery.  Leave suitcase in the car. After surgery it may be brought to your room.     Patients discharged the day of surgery will not be allowed to drive home.  Name and phone number of your driver: daughter Albina Billet Simpson-[phone] cell  Special Instructions: N/A              Please read over the following fact sheets you were given: _____________________________________________________________________             [facility] - Preparing for Surgery Before surgery, you can play an important role.  Because skin is not sterile, your skin needs to be as free of germs as possible.  You can reduce the number of germs on your skin by washing with CHG (chlorahexidine gluconate) soap before surgery.  CHG is an antiseptic cleaner which kills germs and bonds with the skin to continue killing germs even after washing. Please  DO NOT use if you have an allergy to CHG or antibacterial soaps.  If your skin becomes reddened/irritated stop using the CHG and inform your nurse when you arrive at Short Stay. Do not shave (including legs and underarms) for at least 48 hours prior to the first CHG shower.  You may shave your face/neck. Please follow these instructions carefully:  1.  Shower with CHG Soap the night before surgery and the  morning of Surgery.  2.  If you choose to wash your hair, wash your hair first as usual with your  normal  shampoo.  3.  After you shampoo, rinse your hair and body thoroughly to remove the  shampoo.                           4.  Use CHG as you would any other  liquid soap.  You can apply chg directly  to the skin and wash                       Gently with a scrungie or clean washcloth.  5.  Apply the CHG Soap to your body ONLY FROM THE NECK DOWN.   Do not use on face/ open                           Wound or open sores. Avoid contact with eyes, ears mouth and genitals (private parts).                       Wash face,  Genitals (private parts) with your normal soap.             6.  Wash thoroughly, paying special attention to the area where your surgery  will be performed.  7.  Thoroughly rinse your body with warm water from the neck down.  8.  DO NOT shower/wash with your normal soap after using and rinsing off  the CHG Soap.                9.  Pat yourself dry with a clean towel.            10.  Wear clean pajamas.            11.  Place clean sheets on your bed the night of your first shower and do not  sleep with pets. Day of Surgery : Do not apply any lotions/deodorants the morning of surgery.  Please wear clean clothes to the hospital/surgery center.  FAILURE TO FOLLOW THESE INSTRUCTIONS MAY RESULT IN THE CANCELLATION OF YOUR SURGERY PATIENT SIGNATURE_________________________________  NURSE  SIGNATURE__________________________________  ________________________________________________________________________   Adam Phenix  An incentive spirometer is a tool that can help keep your lungs clear and active. This tool measures how well you are filling your lungs with each breath. Taking long deep breaths may help reverse or decrease the chance of developing breathing (pulmonary) problems (especially infection) following:  A long period of time when you are unable to move or be active. BEFORE THE PROCEDURE   If the spirometer includes an indicator to show your best effort, your nurse or respiratory therapist will set it to a desired goal.  If possible, sit up straight or lean slightly forward. Try not to slouch.  Hold the incentive spirometer in an upright position. INSTRUCTIONS FOR USE  1. Sit on the edge of your bed if possible, or sit up as far as you can in bed or on a chair. 2. Hold the incentive spirometer in an upright position. 3. Breathe out normally. 4. Place the mouthpiece in your mouth and seal your lips tightly around it. 5. Breathe in slowly and as deeply as possible, raising the piston or the ball toward the top of the column. 6. Hold your breath for 3-5 seconds or for as long as possible. Allow the piston or ball to fall to the bottom of the column. 7. Remove the mouthpiece from your mouth and breathe out normally. 8. Rest for a few seconds and repeat Steps 1 through 7 at least 10 times every 1-2 hours when you are awake. Take your time and take a few normal breaths between deep breaths. 9. The spirometer may include an indicator to show your best effort. Use the indicator as a goal  to work toward during each repetition. 10. After each set of 10 deep breaths, practice coughing to be sure your lungs are clear. If you have an incision (the cut made at the time of surgery), support your incision when coughing by placing a pillow or rolled up towels firmly  against it. Once you are able to get out of bed, walk around indoors and cough well. You may stop using the incentive spirometer when instructed by your caregiver.  RISKS AND COMPLICATIONS  Take your time so you do not get dizzy or light-headed.  If you are in pain, you may need to take or ask for pain medication before doing incentive spirometry. It is harder to take a deep breath if you are having pain. AFTER USE  Rest and breathe slowly and easily.  It can be helpful to keep track of a log of your progress. Your caregiver can provide you with a simple table to help with this. If you are using the spirometer at home, follow these instructions: [place] IF:   You are having difficultly using the spirometer.  You have trouble using the spirometer as often as instructed.  Your pain medication is not giving enough relief while using the spirometer.  You develop fever of 100.5 F (38.1 C) or higher. SEEK IMMEDIATE MEDICAL CARE IF:   You cough up bloody sputum that had not been present before.  You develop fever of 102 F (38.9 C) or greater.  You develop worsening pain at or near the incision site. MAKE SURE YOU:   Understand these instructions.  Will watch your condition.  Will get help right away if you are not doing well or get worse. Document Released: 10/27/2006 Document Revised: 09/08/2011 Document Reviewed: 12/28/2006 ExitCare Patient Information 2014 ExitCare, [place].   ________________________________________________________________________  WHAT IS A BLOOD TRANSFUSION? Blood Transfusion Information  A transfusion is the replacement of blood or some of its parts. Blood is made up of multiple cells which provide different functions.  Red blood cells carry oxygen and are used for blood loss replacement.  White blood cells fight against infection.  Platelets control bleeding.  Plasma helps clot blood.  Other blood products are available for  specialized needs, such as hemophilia or other clotting disorders. BEFORE THE TRANSFUSION  Who gives blood for transfusions?   Healthy volunteers who are fully evaluated to make sure their blood is safe. This is blood bank blood. Transfusion therapy is the safest it has ever been in the practice of medicine. Before blood is taken from a donor, a complete history is taken to make sure that person has no history of diseases nor engages in risky social behavior (examples are intravenous drug use or sexual activity with multiple partners). The donor's travel history is screened to minimize risk of transmitting infections, such as malaria. The donated blood is tested for signs of infectious diseases, such as HIV and hepatitis. The blood is then tested to be sure it is compatible with you in order to minimize the chance of a transfusion reaction. If you or a relative donates blood, this is often done in anticipation of surgery and is not appropriate for emergency situations. It takes many days to process the donated blood. RISKS AND COMPLICATIONS Although transfusion therapy is very safe and saves many lives, the main dangers of transfusion include:   Getting an infectious disease.  Developing a transfusion reaction. This is an allergic reaction to something in the blood you were given. Every precaution  is taken to prevent this. The decision to have a blood transfusion has been considered carefully by your caregiver before blood is given. Blood is not given unless the benefits outweigh the risks. AFTER THE TRANSFUSION  Right after receiving a blood transfusion, you will usually feel much better and more energetic. This is especially true if your red blood cells have gotten low (anemic). The transfusion raises the level of the red blood cells which carry oxygen, and this usually causes an energy increase.  The nurse administering the transfusion will monitor you carefully for complications. HOME CARE  INSTRUCTIONS  No special instructions are needed after a transfusion. You may find your energy is better. Speak with your caregiver about any limitations on activity for underlying diseases you may have. SEEK MEDICAL CARE IF:   Your condition is not improving after your transfusion.  You develop redness or irritation at the intravenous (IV) site. SEEK IMMEDIATE MEDICAL CARE IF:  Any of the following symptoms occur over the next 12 hours:  Shaking chills.  You have a temperature by mouth above 102 F (38.9 C), not controlled by medicine.  Chest, back, or muscle pain.  People around you feel you are not acting correctly or are confused.  Shortness of breath or difficulty breathing.  Dizziness and fainting.  You get a rash or develop hives.  You have a decrease in urine output.  Your urine turns a dark color or changes to pink, red, or brown. Any of the following symptoms occur over the next 10 days:  You have a temperature by mouth above 102 F (38.9 C), not controlled by medicine.  Shortness of breath.  Weakness after normal activity.  The white part of the eye turns yellow (jaundice).  You have a decrease in the amount of urine or are urinating less often.  Your urine turns a dark color or changes to pink, red, or brown. Document Released: 06/13/2000 Document Revised: 09/08/2011 Document Reviewed: 01/31/2008 [facility] Patient Information 2014 [place], [place].  _______________________________________________________________________

## 2016-05-16 NOTE — Telephone Encounter (Signed)
gREAt!

## 2016-05-21 ENCOUNTER — Other Ambulatory Visit: Payer: Self-pay

## 2016-05-21 MED ORDER — DIAZEPAM 5 MG PO TABS: ORAL_TABLET | ORAL | 0 refills | Status: DC

## 2016-05-25 ENCOUNTER — Ambulatory Visit: Payer: Self-pay | Admitting: Orthopedic Surgery

## 2016-05-25 NOTE — H&P (Signed)
Tamara Lam DOB: 1950/08/30 Married / Language: English / Race: Black or African American Female Date of Admission:  05/26/16 CC:  Right Knee Pain History of Present Illness  The patient is a 65 year old female who comes in for a preoperative History and Physical. The patient is scheduled for a right total knee arthroplasty to be performed by Dr. Dione Plover. Aluisio, MD at [facility] on 05-26-2016. The patient is a 65 year old female who presentedwith knee complaints. The patient reports right knee symptoms including: pain, catching, giving way and grinding which began 1 year(s) ago without any known injury (pain got "really bad"). The patient describes the severity of the symptoms as moderate in severity. The patient describes their pain as dull.The patient feels that the symptoms are worsening. The patient has the current diagnosis of knee osteoarthritis. Symptoms are reported to be located in the right knee and include knee pain. The patient reports that symptoms radiate to the right lower leg. Current treatment includes application of heat, knee brace and nonsteroidal anti-inflammatory drugs (Tylenol). Note for "Knee pain": 2007 Dr. Alvan Dame right knee scope Baker Janus had problems with this knee for many years now. Unfortunately, however, in the past year it has gotten much worse. The pain is problematic for her, but even worsen the pain is the dysfunction. She states the knee feels unstable to her. She cannot trust it. It feels like it is going to make her fall at times. She has had injections in the past including cortisone and visco supplements, but has not had a tremendous amount of benefit with those injections. She is ready to proceed with knee replacement at this time. They have been treated conservatively in the past for the above stated problem and despite conservative measures, they continue to have progressive pain and severe functional limitations and dysfunction. They have failed  non-operative management including home exercise, medications, and injections. It is felt that they would benefit from undergoing total joint replacement. Risks and benefits of the procedure have been discussed with the patient and they elect to proceed with surgery. There are no active contraindications to surgery such as ongoing infection or rapidly progressive neurological disease.  Problem List/Past Medical   Chronic pain of right knee (M25.561)  Primary osteoarthritis of right knee (M17.11)  Anxiety Disorder  Depression  Hypercholesterolemia  Tear, lateral meniscus, knee, current (836.1) [06/05/2005]: Chondromalacia, patella (M22.40) [07/16/2006]: Glaucoma  Hemorrhoids  Diverticulosis  Menopause  Thyroid Nodule  History of Goiter  Allergies No Known Drug Allergies  Family History  Congestive Heart Failure  mother Heart Disease  mother  Social History Alcohol use  never consumed alcohol Children  3 Current work status  retired Engineer, agricultural (Currently)  no Drug/Alcohol Rehab (Previously)  no Exercise  Exercises weekly; does other Illicit drug use  no Marital status  widowed Pain Contract  no Tobacco / smoke exposure  no Tobacco use  never smoker Psychologist, prison and probation services Will, Healthcare POA Post-Surgical Plans  Home  Medication History Vitamin C (500MG  Capsule, Oral) Active. Linzess (290MCG Capsule, Oral) Active. Travatan Z (0.004% Solution, Ophthalmic) Active. Simvastatin (20MG  Tablet, Oral) Active. Diazepam (5MG  Tablet, Oral) Active. Centrum Silver (Oral) Active. Calcium 600 (600MG  Tablet, Oral) Active. Aspirin Childrens (81MG  Tablet Chewable, Oral) Active. Fosamax (70MG  Tablet, Oral) Active. Alendronate Sodium (70MG  Tablet, Oral) Active. Culturelle Probiotic Active.   Past Surgical History Arthroscopy of Knee  Date: 2007. right Gallbladder Surgery  laporoscopic - 1990's Hysterectomy  Date: 1989. partial  (  non-cancerous)    Review of Systems  General Present- Chills, Fatigue and Night Sweats. Not Present- Fever, Memory Loss, Weight Gain and Weight Loss. Skin Not Present- Eczema, Hives, Itching, Lesions and Rash. HEENT Not Present- Dentures, Double Vision, Headache, Hearing Loss, Tinnitus and Visual Loss. Respiratory Not Present- Allergies, Chronic Cough, Coughing up blood, Shortness of breath at rest and Shortness of breath with exertion. Cardiovascular Not Present- Chest Pain, Difficulty Breathing Lying Down, Murmur, Palpitations, Racing/skipping heartbeats and Swelling. Gastrointestinal Present- Constipation. Not Present- Abdominal Pain, Bloody Stool, Diarrhea, Difficulty Swallowing, Heartburn, Jaundice, Loss of appetitie, Nausea and Vomiting. Female Genitourinary Not Present- Blood in Urine, Discharge, Flank Pain, Incontinence, Painful Urination, Urgency, Urinary frequency, Urinary Retention, Urinating at Night and Weak urinary stream. Musculoskeletal Present- Joint Pain, Joint Swelling, Morning Stiffness, Muscle Pain and Muscle Weakness. Not Present- Back Pain and Spasms. Neurological Not Present- Blackout spells, Difficulty with balance, Dizziness, Paralysis, Tremor and Weakness. Psychiatric Not Present- Insomnia.  Vitals Weight: 124 lb Height: 67in Weight was reported by patient. Height was reported by patient. Body Surface Area: 1.65 m Body Mass Index: 19.42 kg/m  Pulse: 84 (Regular)  BP: 142/86 (Sitting, Right Arm, Standard)  Physical Exam General Mental Status -Alert, cooperative and good historian. General Appearance-pleasant, Not in acute distress. Orientation-Oriented X3. Build & Nutrition-Well nourished and Well developed. Note: thin framed  Head and Neck Head-normocephalic, atraumatic . Neck Global Assessment - supple, no bruit auscultated on the right, no bruit auscultated on the left.  Eye Vision-Wears corrective lenses(readers). Pupil -  Bilateral-Regular and Round. Motion - Bilateral-EOMI.  Chest and Lung Exam Auscultation Breath sounds - clear at anterior chest wall and clear at posterior chest wall. Adventitious sounds - No Adventitious sounds.  Cardiovascular Auscultation Rhythm - Regular rate and rhythm. Heart Sounds - S1 WNL and S2 WNL. Murmurs & Other Heart Sounds - Auscultation of the heart reveals - No Murmurs.  Abdomen Palpation/Percussion Tenderness - Abdomen is non-tender to palpation. Rigidity (guarding) - Abdomen is soft. Auscultation Auscultation of the abdomen reveals - Bowel sounds normal.  Female Genitourinary Note: Not done, not pertinent to present illness   Musculoskeletal Note: On exam, she is a very pleasant, well-developed female, alert and oriented, in no apparent distress. Her hips show normal range of motion with no discomfort. Her right knee shows a slight valgus deformity. There is no effusion. Range is 5 to 125, but marked crepitus on range of motion, tenderness lateral greater than medial with no instability noted. Left knee exam is unremarkable. Range 0 to 125. Slight crepitus on range of motion but no tenderness or instability.  Her radiographs, AP both knees and lateral of the right show that she has bone-on-bone arthritis in the lateral and patellofemoral compartments of the right knee. This is gotten progressively worse compared to previous films.   Assessment & Plan  Primary osteoarthritis of right knee (M17.11)  Note:Surgical Plans: Right Total Knee Replacement  Disposition: Home  PCP: Dr. Tula Nakayama  IV TXA  Anesthesia Issues: None  Signed electronically by Ok Edwards, III PA-C

## 2016-05-26 ENCOUNTER — Inpatient Hospital Stay (HOSPITAL_COMMUNITY): Payer: Medicare Other | Admitting: Anesthesiology

## 2016-05-26 ENCOUNTER — Encounter (HOSPITAL_COMMUNITY): Payer: Self-pay | Admitting: *Deleted

## 2016-05-26 ENCOUNTER — Encounter (HOSPITAL_COMMUNITY)
Admission: RE | Disposition: A | Discharge status: Home / Self Care | Payer: Self-pay | Source: Ambulatory Visit | Attending: Orthopedic Surgery

## 2016-05-26 ENCOUNTER — Inpatient Hospital Stay (HOSPITAL_COMMUNITY)
Admission: RE | Admit: 2016-05-26 | Discharge: 2016-05-28 | DRG: 470 | Disposition: A | Discharge status: Home / Self Care | Payer: Medicare Other | Source: Ambulatory Visit | Attending: Orthopedic Surgery | Admitting: Orthopedic Surgery

## 2016-05-26 DIAGNOSIS — M1711 Unilateral primary osteoarthritis, right knee: Principal | ICD-10-CM | POA: Diagnosis present

## 2016-05-26 DIAGNOSIS — F419 Anxiety disorder, unspecified: Secondary | ICD-10-CM | POA: Diagnosis present

## 2016-05-26 DIAGNOSIS — M179 Osteoarthritis of knee, unspecified: Secondary | ICD-10-CM

## 2016-05-26 DIAGNOSIS — H409 Unspecified glaucoma: Secondary | ICD-10-CM | POA: Diagnosis present

## 2016-05-26 DIAGNOSIS — K5790 Diverticulosis of intestine, part unspecified, without perforation or abscess without bleeding: Secondary | ICD-10-CM | POA: Diagnosis present

## 2016-05-26 DIAGNOSIS — F329 Major depressive disorder, single episode, unspecified: Secondary | ICD-10-CM | POA: Diagnosis present

## 2016-05-26 DIAGNOSIS — K649 Unspecified hemorrhoids: Secondary | ICD-10-CM | POA: Diagnosis present

## 2016-05-26 DIAGNOSIS — E7800 Pure hypercholesterolemia, unspecified: Secondary | ICD-10-CM | POA: Diagnosis not present

## 2016-05-26 DIAGNOSIS — M2100 Valgus deformity, not elsewhere classified, unspecified site: Secondary | ICD-10-CM | POA: Diagnosis not present

## 2016-05-26 DIAGNOSIS — M1710 Unilateral primary osteoarthritis, unspecified knee: Secondary | ICD-10-CM

## 2016-05-26 DIAGNOSIS — E079 Disorder of thyroid, unspecified: Secondary | ICD-10-CM | POA: Diagnosis not present

## 2016-05-26 DIAGNOSIS — Z7982 Long term (current) use of aspirin: Secondary | ICD-10-CM

## 2016-05-26 DIAGNOSIS — Z79899 Other long term (current) drug therapy: Secondary | ICD-10-CM | POA: Diagnosis not present

## 2016-05-26 DIAGNOSIS — E119 Type 2 diabetes mellitus without complications: Secondary | ICD-10-CM | POA: Diagnosis present

## 2016-05-26 DIAGNOSIS — E785 Hyperlipidemia, unspecified: Secondary | ICD-10-CM | POA: Diagnosis not present

## 2016-05-26 HISTORY — PX: TOTAL KNEE ARTHROPLASTY: SHX125

## 2016-05-26 LAB — TYPE AND SCREEN
ABO/RH(D): O POS
Antibody Screen: NEGATIVE

## 2016-05-26 SURGERY — ARTHROPLASTY, KNEE, TOTAL
Anesthesia: Monitor Anesthesia Care | Site: Knee | Laterality: Right

## 2016-05-26 MED ORDER — MIDAZOLAM HCL 2 MG/2ML IJ SOLN
INTRAMUSCULAR | Status: DC | PRN
Start: 2016-05-26 — End: 2016-05-26
  Administered 2016-05-26: 2 mg via INTRAVENOUS

## 2016-05-26 MED ORDER — PHENYLEPHRINE 40 MCG/ML (10ML) SYRINGE FOR IV PUSH (FOR BLOOD PRESSURE SUPPORT)
PREFILLED_SYRINGE | INTRAVENOUS | Status: DC | PRN
Start: 2016-05-26 — End: 2016-05-26
  Administered 2016-05-26 (×3): 80 ug via INTRAVENOUS
  Administered 2016-05-26: 40 ug via INTRAVENOUS
  Administered 2016-05-26: 80 ug via INTRAVENOUS
  Administered 2016-05-26: 40 ug via INTRAVENOUS
  Administered 2016-05-26: 80 ug via INTRAVENOUS
  Administered 2016-05-26: 40 ug via INTRAVENOUS
  Administered 2016-05-26 (×2): 80 ug via INTRAVENOUS
  Administered 2016-05-26: 40 ug via INTRAVENOUS
  Administered 2016-05-26 (×2): 80 ug via INTRAVENOUS
  Administered 2016-05-26: 40 ug via INTRAVENOUS

## 2016-05-26 MED ORDER — SIMVASTATIN 20 MG PO TABS
20.0000 mg | ORAL_TABLET | Freq: Every day | ORAL | Status: DC
  Administered 2016-05-26 – 2016-05-27 (×2): 20 mg via ORAL
  Filled 2016-05-26 (×2): qty 1

## 2016-05-26 MED ORDER — METHOCARBAMOL 500 MG PO TABS
500.0000 mg | ORAL_TABLET | Freq: Four times a day (QID) | ORAL | Status: DC | PRN
Start: 2016-05-26 — End: 2016-05-28
  Administered 2016-05-28: 500 mg via ORAL
  Filled 2016-05-26: qty 1

## 2016-05-26 MED ORDER — TRAMADOL HCL 50 MG PO TABS: 50.0000 mg | ORAL_TABLET | Freq: Four times a day (QID) | ORAL | Status: DC | PRN

## 2016-05-26 MED ORDER — BUPIVACAINE LIPOSOME 1.3 % IJ SUSP
20.0000 mL | Freq: Once | INTRAMUSCULAR | Status: DC
  Filled 2016-05-26: qty 20

## 2016-05-26 MED ORDER — BISACODYL 10 MG RE SUPP: 10.0000 mg | Freq: Every day | RECTAL | Status: DC | PRN

## 2016-05-26 MED ORDER — PROMETHAZINE HCL 25 MG/ML IJ SOLN: 6.2500 mg | INTRAMUSCULAR | Status: DC | PRN

## 2016-05-26 MED ORDER — DOCUSATE SODIUM 100 MG PO CAPS
100.0000 mg | ORAL_CAPSULE | Freq: Two times a day (BID) | ORAL | Status: DC
  Filled 2016-05-26 (×2): qty 1

## 2016-05-26 MED ORDER — ACETAMINOPHEN 325 MG PO TABS: 650.0000 mg | ORAL_TABLET | Freq: Four times a day (QID) | ORAL | Status: DC | PRN

## 2016-05-26 MED ORDER — ONDANSETRON HCL 4 MG/2ML IJ SOLN
4.0000 mg | Freq: Four times a day (QID) | INTRAMUSCULAR | Status: DC | PRN
  Administered 2016-05-27: 4 mg via INTRAVENOUS
  Filled 2016-05-26: qty 2

## 2016-05-26 MED ORDER — CEFAZOLIN SODIUM-DEXTROSE 2-4 GM/100ML-% IV SOLN
2.0000 g | Freq: Four times a day (QID) | INTRAVENOUS | Status: AC
  Administered 2016-05-26 (×2): 2 g via INTRAVENOUS
  Filled 2016-05-26 (×2): qty 100

## 2016-05-26 MED ORDER — 0.9 % SODIUM CHLORIDE (POUR BTL) OPTIME
TOPICAL | Status: DC | PRN
  Administered 2016-05-26: 1000 mL

## 2016-05-26 MED ORDER — POLYETHYLENE GLYCOL 3350 17 G PO PACK: 17.0000 g | PACK | Freq: Every day | ORAL | Status: DC | PRN

## 2016-05-26 MED ORDER — DIPHENHYDRAMINE HCL 12.5 MG/5ML PO ELIX: 12.5000 mg | ORAL_SOLUTION | ORAL | Status: DC | PRN

## 2016-05-26 MED ORDER — HYDROMORPHONE HCL 1 MG/ML IJ SOLN: 0.2500 mg | INTRAMUSCULAR | Status: DC | PRN

## 2016-05-26 MED ORDER — ONDANSETRON HCL 4 MG PO TABS: 4.0000 mg | ORAL_TABLET | Freq: Four times a day (QID) | ORAL | Status: DC | PRN

## 2016-05-26 MED ORDER — BUPIVACAINE HCL 0.25 % IJ SOLN
INTRAMUSCULAR | Status: DC | PRN
  Administered 2016-05-26: 30 mL

## 2016-05-26 MED ORDER — MEPERIDINE HCL 50 MG/ML IJ SOLN
INTRAMUSCULAR | Status: AC
  Filled 2016-05-26: qty 1

## 2016-05-26 MED ORDER — PROPOFOL 10 MG/ML IV BOLUS
INTRAVENOUS | Status: AC
  Filled 2016-05-26: qty 40

## 2016-05-26 MED ORDER — METOCLOPRAMIDE HCL 5 MG PO TABS: 5.0000 mg | ORAL_TABLET | Freq: Three times a day (TID) | ORAL | Status: DC | PRN

## 2016-05-26 MED ORDER — MEPERIDINE HCL 50 MG/ML IJ SOLN
6.2500 mg | INTRAMUSCULAR | Status: DC | PRN
  Administered 2016-05-26 (×2): 12.5 mg via INTRAVENOUS

## 2016-05-26 MED ORDER — PROPOFOL 10 MG/ML IV BOLUS
INTRAVENOUS | Status: AC
  Filled 2016-05-26: qty 20

## 2016-05-26 MED ORDER — DIAZEPAM 5 MG PO TABS: 5.0000 mg | ORAL_TABLET | Freq: Two times a day (BID) | ORAL | Status: DC | PRN

## 2016-05-26 MED ORDER — TRANEXAMIC ACID 1000 MG/10ML IV SOLN
1000.0000 mg | Freq: Once | INTRAVENOUS | Status: AC
  Administered 2016-05-26: 1000 mg via INTRAVENOUS
  Filled 2016-05-26: qty 10

## 2016-05-26 MED ORDER — BUPIVACAINE HCL (PF) 0.25 % IJ SOLN
INTRAMUSCULAR | Status: AC
  Filled 2016-05-26: qty 30

## 2016-05-26 MED ORDER — SODIUM CHLORIDE 0.9 % IR SOLN
Status: DC | PRN
  Administered 2016-05-26: 1000 mL

## 2016-05-26 MED ORDER — DEXAMETHASONE SODIUM PHOSPHATE 10 MG/ML IJ SOLN
10.0000 mg | Freq: Once | INTRAMUSCULAR | Status: AC
  Administered 2016-05-27: 10 mg via INTRAVENOUS
  Filled 2016-05-26: qty 1

## 2016-05-26 MED ORDER — SODIUM CHLORIDE 0.9 % IJ SOLN
INTRAMUSCULAR | Status: AC
  Filled 2016-05-26: qty 50

## 2016-05-26 MED ORDER — LINACLOTIDE 290 MCG PO CAPS
290.0000 ug | ORAL_CAPSULE | Freq: Every day | ORAL | Status: DC
  Filled 2016-05-26 (×2): qty 1

## 2016-05-26 MED ORDER — ONDANSETRON HCL 4 MG/2ML IJ SOLN
INTRAMUSCULAR | Status: DC | PRN
  Administered 2016-05-26: 4 mg via INTRAVENOUS

## 2016-05-26 MED ORDER — PROPOFOL 10 MG/ML IV BOLUS
INTRAVENOUS | Status: DC | PRN
  Administered 2016-05-26: 10 mg via INTRAVENOUS
  Administered 2016-05-26: 20 mg via INTRAVENOUS

## 2016-05-26 MED ORDER — BUPIVACAINE IN DEXTROSE 0.75-8.25 % IT SOLN
INTRATHECAL | Status: DC | PRN
  Administered 2016-05-26: 2 mL via INTRATHECAL

## 2016-05-26 MED ORDER — MENTHOL 3 MG MT LOZG: 1.0000 | LOZENGE | OROMUCOSAL | Status: DC | PRN

## 2016-05-26 MED ORDER — MORPHINE SULFATE (PF) 2 MG/ML IV SOLN
1.0000 mg | INTRAVENOUS | Status: DC | PRN
  Administered 2016-05-26: 1 mg via INTRAVENOUS
  Filled 2016-05-26: qty 1

## 2016-05-26 MED ORDER — EPHEDRINE SULFATE-NACL 50-0.9 MG/10ML-% IV SOSY
PREFILLED_SYRINGE | INTRAVENOUS | Status: DC | PRN
  Administered 2016-05-26: 5 mg via INTRAVENOUS
  Administered 2016-05-26: 10 mg via INTRAVENOUS
  Administered 2016-05-26 (×2): 5 mg via INTRAVENOUS

## 2016-05-26 MED ORDER — RIVAROXABAN 10 MG PO TABS
10.0000 mg | ORAL_TABLET | Freq: Every day | ORAL | Status: DC
  Administered 2016-05-27 – 2016-05-28 (×2): 10 mg via ORAL
  Filled 2016-05-26 (×2): qty 1

## 2016-05-26 MED ORDER — SODIUM CHLORIDE 0.9 % IV SOLN
INTRAVENOUS | Status: DC
  Administered 2016-05-26: 16:00:00 via INTRAVENOUS

## 2016-05-26 MED ORDER — STERILE WATER FOR IRRIGATION IR SOLN
Status: DC | PRN
  Administered 2016-05-26: 3000 mL

## 2016-05-26 MED ORDER — TRANEXAMIC ACID 1000 MG/10ML IV SOLN
1000.0000 mg | INTRAVENOUS | Status: AC
  Administered 2016-05-26: 1000 mg via INTRAVENOUS
  Filled 2016-05-26: qty 10

## 2016-05-26 MED ORDER — DEXAMETHASONE SODIUM PHOSPHATE 10 MG/ML IJ SOLN
10.0000 mg | Freq: Once | INTRAMUSCULAR | Status: AC
  Administered 2016-05-26: 10 mg via INTRAVENOUS

## 2016-05-26 MED ORDER — SODIUM CHLORIDE 0.9 % IJ SOLN
INTRAMUSCULAR | Status: DC | PRN
  Administered 2016-05-26: 50 mL

## 2016-05-26 MED ORDER — ONDANSETRON HCL 4 MG/2ML IJ SOLN
INTRAMUSCULAR | Status: AC
  Filled 2016-05-26: qty 2

## 2016-05-26 MED ORDER — CEFAZOLIN SODIUM-DEXTROSE 2-4 GM/100ML-% IV SOLN
2.0000 g | INTRAVENOUS | Status: AC
  Administered 2016-05-26: 2 g via INTRAVENOUS
  Filled 2016-05-26: qty 100

## 2016-05-26 MED ORDER — FLEET ENEMA 7-19 GM/118ML RE ENEM: 1.0000 | ENEMA | Freq: Once | RECTAL | Status: DC | PRN

## 2016-05-26 MED ORDER — LACTATED RINGERS IV SOLN
INTRAVENOUS | Status: DC
  Administered 2016-05-26 (×3): via INTRAVENOUS

## 2016-05-26 MED ORDER — OXYCODONE HCL 5 MG PO TABS
5.0000 mg | ORAL_TABLET | ORAL | Status: DC | PRN
  Administered 2016-05-26: 10 mg via ORAL
  Administered 2016-05-26: 5 mg via ORAL
  Administered 2016-05-26 – 2016-05-28 (×8): 10 mg via ORAL
  Filled 2016-05-26: qty 1
  Filled 2016-05-26 (×9): qty 2

## 2016-05-26 MED ORDER — CHLORHEXIDINE GLUCONATE 4 % EX LIQD: 60.0000 mL | Freq: Once | CUTANEOUS | Status: DC

## 2016-05-26 MED ORDER — METHOCARBAMOL 1000 MG/10ML IJ SOLN
500.0000 mg | Freq: Four times a day (QID) | INTRAVENOUS | Status: DC | PRN
  Administered 2016-05-26: 500 mg via INTRAVENOUS
  Filled 2016-05-26: qty 5
  Filled 2016-05-26: qty 550

## 2016-05-26 MED ORDER — FENTANYL CITRATE (PF) 100 MCG/2ML IJ SOLN
INTRAMUSCULAR | Status: DC | PRN
  Administered 2016-05-26 (×2): 50 ug via INTRAVENOUS

## 2016-05-26 MED ORDER — PROPOFOL 500 MG/50ML IV EMUL
INTRAVENOUS | Status: DC | PRN
Start: 2016-05-26 — End: 2016-05-26
  Administered 2016-05-26: 25 ug/kg/min via INTRAVENOUS

## 2016-05-26 MED ORDER — FENTANYL CITRATE (PF) 100 MCG/2ML IJ SOLN
INTRAMUSCULAR | Status: AC
  Filled 2016-05-26: qty 2

## 2016-05-26 MED ORDER — ACETAMINOPHEN 500 MG PO TABS
1000.0000 mg | ORAL_TABLET | Freq: Four times a day (QID) | ORAL | Status: AC
  Administered 2016-05-26 – 2016-05-27 (×4): 1000 mg via ORAL
  Filled 2016-05-26 (×4): qty 2

## 2016-05-26 MED ORDER — LIDOCAINE 2% (20 MG/ML) 5 ML SYRINGE
INTRAMUSCULAR | Status: DC | PRN
Start: 2016-05-26 — End: 2016-05-26
  Administered 2016-05-26: 20 mg via INTRAVENOUS

## 2016-05-26 MED ORDER — ACETAMINOPHEN 650 MG RE SUPP: 650.0000 mg | Freq: Four times a day (QID) | RECTAL | Status: DC | PRN

## 2016-05-26 MED ORDER — ACETAMINOPHEN 10 MG/ML IV SOLN
INTRAVENOUS | Status: AC
Start: 2016-05-26 — End: 2016-05-26
  Filled 2016-05-26: qty 100

## 2016-05-26 MED ORDER — BUPIVACAINE LIPOSOME 1.3 % IJ SUSP
INTRAMUSCULAR | Status: DC | PRN
  Administered 2016-05-26: 20 mL

## 2016-05-26 MED ORDER — CEFAZOLIN SODIUM-DEXTROSE 2-4 GM/100ML-% IV SOLN
INTRAVENOUS | Status: AC
Start: 2016-05-26 — End: 2016-05-26
  Filled 2016-05-26: qty 100

## 2016-05-26 MED ORDER — ACETAMINOPHEN 10 MG/ML IV SOLN
1000.0000 mg | Freq: Once | INTRAVENOUS | Status: AC
  Administered 2016-05-26: 1000 mg via INTRAVENOUS
  Filled 2016-05-26: qty 100

## 2016-05-26 MED ORDER — PHENOL 1.4 % MT LIQD
1.0000 | OROMUCOSAL | Status: DC | PRN
  Filled 2016-05-26: qty 177

## 2016-05-26 MED ORDER — METOCLOPRAMIDE HCL 5 MG/ML IJ SOLN: 5.0000 mg | Freq: Three times a day (TID) | INTRAMUSCULAR | Status: DC | PRN

## 2016-05-26 MED ORDER — LATANOPROST 0.005 % OP SOLN
1.0000 [drp] | Freq: Every day | OPHTHALMIC | Status: DC
  Administered 2016-05-26 – 2016-05-27 (×2): 1 [drp] via OPHTHALMIC
  Filled 2016-05-26: qty 2.5

## 2016-05-26 MED ORDER — TRAVOPROST 0.004 % OP SOLN: 1.0000 [drp] | Freq: Every day | OPHTHALMIC | Status: DC

## 2016-05-26 SURGICAL SUPPLY — 48 items
BAG DECANTER FOR FLEXI CONT (MISCELLANEOUS) ×2 IMPLANT
BAG ZIPLOCK 12X15 (MISCELLANEOUS) ×2 IMPLANT
BANDAGE ACE 6X5 VEL STRL LF (GAUZE/BANDAGES/DRESSINGS) ×2 IMPLANT
BLADE SAG 18X100X1.27 (BLADE) ×2 IMPLANT
BLADE SAW SGTL 11.0X1.19X90.0M (BLADE) ×2 IMPLANT
BOWL SMART MIX CTS (DISPOSABLE) ×2 IMPLANT
CAPT KNEE TOTAL 3 ATTUNE ×2 IMPLANT
CEMENT HV SMART SET (Cement) ×4 IMPLANT
CLOTH BEACON ORANGE TIMEOUT ST (SAFETY) ×2 IMPLANT
CUFF TOURN SGL QUICK 34 (TOURNIQUET CUFF) ×1
CUFF TRNQT CYL 34X4X40X1 (TOURNIQUET CUFF) ×1 IMPLANT
DECANTER SPIKE VIAL GLASS SM (MISCELLANEOUS) ×2 IMPLANT
DRAPE U-SHAPE 47X51 STRL (DRAPES) ×2 IMPLANT
DRSG ADAPTIC 3X8 NADH LF (GAUZE/BANDAGES/DRESSINGS) ×2 IMPLANT
DRSG PAD ABDOMINAL 8X10 ST (GAUZE/BANDAGES/DRESSINGS) ×2 IMPLANT
DURAPREP 26ML APPLICATOR (WOUND CARE) ×2 IMPLANT
ELECT REM PT RETURN 9FT ADLT (ELECTROSURGICAL) ×2
ELECTRODE REM PT RTRN 9FT ADLT (ELECTROSURGICAL) ×1 IMPLANT
EVACUATOR 1/8 PVC DRAIN (DRAIN) ×2 IMPLANT
GAUZE SPONGE 4X4 12PLY STRL (GAUZE/BANDAGES/DRESSINGS) ×2 IMPLANT
GLOVE BIO SURGEON STRL SZ7.5 (GLOVE) IMPLANT
GLOVE BIO SURGEON STRL SZ8 (GLOVE) ×2 IMPLANT
GLOVE BIOGEL PI IND STRL 6.5 (GLOVE) ×6 IMPLANT
GLOVE BIOGEL PI IND STRL 8 (GLOVE) ×1 IMPLANT
GLOVE BIOGEL PI INDICATOR 6.5 (GLOVE) ×6
GLOVE BIOGEL PI INDICATOR 8 (GLOVE) ×1
GLOVE SURG SS PI 6.5 STRL IVOR (GLOVE) IMPLANT
GOWN STRL REUS W/TWL LRG LVL3 (GOWN DISPOSABLE) ×4 IMPLANT
GOWN STRL REUS W/TWL XL LVL3 (GOWN DISPOSABLE) ×4 IMPLANT
HANDPIECE INTERPULSE COAX TIP (DISPOSABLE) ×1
IMMOBILIZER KNEE 20 (SOFTGOODS) ×2
IMMOBILIZER KNEE 20 THIGH 36 (SOFTGOODS) ×1 IMPLANT
MANIFOLD NEPTUNE II (INSTRUMENTS) ×2 IMPLANT
NS IRRIG 1000ML POUR BTL (IV SOLUTION) ×2 IMPLANT
PACK TOTAL KNEE CUSTOM (KITS) ×2 IMPLANT
PADDING CAST COTTON 6X4 STRL (CAST SUPPLIES) ×2 IMPLANT
POSITIONER SURGICAL ARM (MISCELLANEOUS) ×2 IMPLANT
SET HNDPC FAN SPRY TIP SCT (DISPOSABLE) ×1 IMPLANT
STRIP CLOSURE SKIN 1/2X4 (GAUZE/BANDAGES/DRESSINGS) ×2 IMPLANT
SUT MNCRL AB 4-0 PS2 18 (SUTURE) ×2 IMPLANT
SUT VIC AB 2-0 CT1 27 (SUTURE) ×3
SUT VIC AB 2-0 CT1 TAPERPNT 27 (SUTURE) ×3 IMPLANT
SUT VLOC 180 0 24IN GS25 (SUTURE) ×2 IMPLANT
SYR 50ML LL SCALE MARK (SYRINGE) ×2 IMPLANT
TRAY FOLEY W/METER SILVER 16FR (SET/KITS/TRAYS/PACK) ×2 IMPLANT
WATER STERILE IRR 1500ML POUR (IV SOLUTION) ×4 IMPLANT
WRAP KNEE MAXI GEL POST OP (GAUZE/BANDAGES/DRESSINGS) ×2 IMPLANT
YANKAUER SUCT BULB TIP 10FT TU (MISCELLANEOUS) ×2 IMPLANT

## 2016-05-26 NOTE — Anesthesia Postprocedure Evaluation (Signed)
Anesthesia Post Note  Patient: Tamara Lam  Procedure(s) Performed: Procedure(s) (LRB): RIGHT TOTAL KNEE ARTHROPLASTY (Right)  Patient location during evaluation: PACU Anesthesia Type: Spinal and MAC Level of consciousness: awake and alert Pain management: pain level controlled Vital Signs Assessment: post-procedure vital signs reviewed and stable Respiratory status: spontaneous breathing and respiratory function stable Cardiovascular status: blood pressure returned to baseline and stable Postop Assessment: spinal receding Anesthetic complications: no    Last Vitals:  Vitals:   05/26/16 1130 05/26/16 1208  BP: 128/70 123/69  Pulse: 63 68  Resp: 13 15  Temp: 36.8 C 36.4 C    Last Pain:  Vitals:   05/26/16 0712  TempSrc: Oral                 Eual Lindstrom DANIEL

## 2016-05-26 NOTE — Anesthesia Preprocedure Evaluation (Signed)
Anesthesia Evaluation  Patient identified by MRN, date of birth, ID band Patient awake    Reviewed: Allergy & Precautions, NPO status , Patient's Chart, lab work & pertinent test results  History of Anesthesia Complications Negative for: history of anesthetic complications  Airway Mallampati: II  TM Distance: >3 FB Neck ROM: Full    Dental no notable dental hx. (+) Dental Advisory Given   Pulmonary neg pulmonary ROS,    Pulmonary exam normal        Cardiovascular negative cardio ROS Normal cardiovascular exam     Neuro/Psych Anxiety negative neurological ROS  negative psych ROS   GI/Hepatic Neg liver ROS, GERD  ,  Endo/Other  negative endocrine ROSdiabetes  Renal/GU negative Renal ROS  negative genitourinary   Musculoskeletal negative musculoskeletal ROS (+)   Abdominal   Peds negative pediatric ROS (+)  Hematology negative hematology ROS (+)   Anesthesia Other Findings   Reproductive/Obstetrics negative OB ROS                             Anesthesia Physical Anesthesia Plan  ASA: II  Anesthesia Plan: MAC and Spinal   Post-op Pain Management:    Induction:   Airway Management Planned: Natural Airway and Simple Face Mask  Additional Equipment:   Intra-op Plan:   Post-operative Plan:   Informed Consent: I have reviewed the patients History and Physical, chart, labs and discussed the procedure including the risks, benefits and alternatives for the proposed anesthesia with the patient or authorized representative who has indicated his/her understanding and acceptance.   Dental advisory given  Plan Discussed with: CRNA and Anesthesiologist  Anesthesia Plan Comments:         Anesthesia Quick Evaluation

## 2016-05-26 NOTE — Anesthesia Procedure Notes (Signed)
Spinal  Patient location during procedure: OR End time: 05/26/2016 9:23 AM Staffing Resident/CRNA: Cynda Familia Performed: resident/CRNA  Preanesthetic Checklist Completed: patient identified, site marked, surgical consent, pre-op evaluation, timeout performed, IV checked, risks and benefits discussed and monitors and equipment checked Spinal Block Patient position: sitting Prep: ChloraPrep and site prepped and draped Patient monitoring: heart rate, continuous pulse ox, blood pressure and cardiac monitor Approach: midline Location: L3-4 Injection technique: single-shot Needle Needle type: Sprotte  Needle gauge: 24 G Assessment Sensory level: T4 Additional Notes Expiration date of tray noted and within date.   Patient tolerated procedure well. Tobias Alexander present and supervised procedure--- prep dry at time of injection

## 2016-05-26 NOTE — Op Note (Signed)
Pre-operative diagnosis- Osteoarthritis Right knee(s)  Post-operative diagnosis- Osteoarthritis  Right knee(s)  Procedure-   Right Total Knee Arthroplasty  Surgeon- Dione Plover. Martesha Niedermeier, MD  Assistant- Ardeen Jourdain, PA-C   Anesthesia-  Spinal   EBL- * No blood loss amount entered *   Drains Hemovac x 1   Tourniquet time  Total Tourniquet Time Documented: Thigh (Right) - 31 minutes Total: Thigh (Right) - 31 minutes    Complications- None  Condition-PACU - hemodynamically stable.   Brief Clinical Note  Tamara Lam is a 65 y.o. year old female with end stage OA of her right knee with progressively worsening pain and dysfunction. She has constant pain, with activity and at rest and significant functional deficits with difficulties even with ADLs. She has had extensive non-op management including analgesics, injections of cortisone and viscosupplements, and home exercise program, but remains in significant pain with significant dysfunction.Radiographs show bone on bone arthritis lateral and patellofemoral with valgus deformity. She presents now for right Total Knee Arthroplasty.    Procedure in detail---       The patient is brought into the operating room and positioned supine on the operating table. After successful administration of Spinal anesthetic, a tourniquet is placed high on the Right thigh(s) and the lower extremity is prepped and draped in the usual sterile fashion. Time out is performed by the operating team and then the Right  lower extremity is wrapped in Esmarch, knee flexed and the tourniquet inflated to 300 mmHg.       A midline incision is made with a ten blade through the subcutaneous tissue to the level of the extensor mechanism. A fresh blade is used to make a lateral parapatellar arthrotomy due to the patients' valgus deformity. Soft tissue over the proximal lateral tibia is subperiosteally elevated to the joint line with a knife to the posterolateral corner but  not including the structures of the posterolateral corner. Soft tissue over the proximal medial tibia is elevated with attention being paid to avoiding the patellar tendon on the tibial tubercle. The patella is everted medially, knee flexed 90 degrees and the ACL and PCL are removed. Findings are bone on bone lateral and patellofemoral with large lateral and patellar osteophytes .       The drill is used to create a starting hole in the distal femur and the canal is thoroughly irrigated with sterile saline to remove the fatty contents. The 5 degree Right  valgus alignment guide is placed into the femoral canal and the distal femoral cutting block is pinned to remove 9  mm off the distal femur. Resection is made with an oscillating saw.      The tibia is subluxed forward and the menisci are removed. The extramedullary alignment guide is placed referencing proximally at the medial aspect of the tibial tubercle and distally along the second metatarsal axis and tibial crest. The block is pinned to remove 58mm off the more deficient lateral side. Resection is made with an oscillating saw. Size 4  is the most appropriate size for the tibia and the proximal tibia is prepared with the modular drill and keel punch for that size.      The femoral sizing guide is placed and size 5  is most appropriate. Rotation is marked off the epicondylar axis and confirmed by creating a rectangular flexion gap at 90 degrees. The size 5  cutting block is pinned in this rotation and the anterior, posterior and chamfer cuts are made with  the oscillating saw. The intercondylar block is then placed and that cut is made.      Trial size 4  tibial component, trial size 5 narrow posterior stabilized femur and a 8  mm posterior stabilized rotating platform insert trial is placed. Full extension is achieved with excellent varus/valgus and   anterior/posterior balance throughout full range of motion. The patella is everted and thickness measured to  be 22  mm. Free hand resection is taken to 12 mm, a 35 template is placed, lug holes are drilled, trial patella is placed, and it tracks normally. Osteophytes are removed off the posterior femur with the trial in place. All trials are removed and the cut bone surfaces prepared with pulsatile lavage. Cement is mixed and once ready for implantation, the size 4  tibial implant, size 5 narrow posterior stabilized femoral component, and the size 35  patella are cemented in place and the patella is held with the clamp. The trial insert is placed and the knee held in full extension. The Exparel (20 ml mixed with 30 ml saline) and then 20 ml of .25% Bupivicaine is injected into the extensor mechanism, posterior capsule, medial and lateral gutters and subcutaneous tissues. All extruded cement is removed and once the cement is hard the permanent 8  mm posterior stabilized rotating platform insert is placed into the tibial tray.      The wound is copiously irrigated with saline solution and the tourniquet is released for a total   tourniquet time of 31  minutes. Bleeding is identified and controlled with electrocautery. The extensor mechanism is closed with interrupted #1 V-loc leaving open a small area from the superior to inferior pole of the patella to serve as a mini lateral release. Flexion against gravity is 135  degrees and the patella tracks normally. Subcutaneous tissue is closed with 2.0 vicryl and subcuticular with running 4.0 Monocryl.The incision is cleaned and dried and steri-strips and a bulky sterile dressing are applied. The limb is placed into a knee immobilizer and the patient is awakened and transported to recovery in stable condition.      Please note that a surgical assistant was a medical necessity for this procedure in order to perform it in a safe and expeditious manner. Surgical assistant was necessary to retract the ligaments and vital neurovascular structures to prevent injury to them and also  necessary for proper positioning of the limb to allow for anatomic placement of the prosthesis.    Dione Plover Tamara Shader, MD    05/26/2016, 10:21 AM

## 2016-05-26 NOTE — H&P (View-Only) (Signed)
Tamara Lam DOB: [birth year] Married / Language: English / Race: Black or African American Female Date of Admission:  05/26/16 CC:  Right Knee Pain History of Present Illness  The patient is a 65 year old female who comes in for a preoperative History and Physical. The patient is scheduled for a right total knee arthroplasty to be performed by Dr. Dione Plover. Aluisio, MD at [facility] on 05-26-2016. The patient is a 65 year old female who presentedwith knee complaints. The patient reports right knee symptoms including: pain, catching, giving way and grinding which began 1 year(s) ago without any known injury (pain got "really bad"). The patient describes the severity of the symptoms as moderate in severity. The patient describes their pain as dull.The patient feels that the symptoms are worsening. The patient has the current diagnosis of knee osteoarthritis. Symptoms are reported to be located in the right knee and include knee pain. The patient reports that symptoms radiate to the right lower leg. Current treatment includes application of heat, knee brace and nonsteroidal anti-inflammatory drugs (Tylenol). Note for "Knee pain": 2007 Dr. Alvan Dame right knee scope Baker Janus had problems with this knee for many years now. Unfortunately, however, in the past year it has gotten much worse. The pain is problematic for her, but even worsen the pain is the dysfunction. She states the knee feels unstable to her. She cannot trust it. It feels like it is going to make her fall at times. She has had injections in the past including cortisone and visco supplements, but has not had a tremendous amount of benefit with those injections. She is ready to proceed with knee replacement at this time. They have been treated conservatively in the past for the above stated problem and despite conservative measures, they continue to have progressive pain and severe functional limitations and dysfunction. They have failed  non-operative management including home exercise, medications, and injections. It is felt that they would benefit from undergoing total joint replacement. Risks and benefits of the procedure have been discussed with the patient and they elect to proceed with surgery. There are no active contraindications to surgery such as ongoing infection or rapidly progressive neurological disease.  Problem List/Past Medical   Chronic pain of right knee (M25.561)  Primary osteoarthritis of right knee (M17.11)  Anxiety Disorder  Depression  Hypercholesterolemia  Tear, lateral meniscus, knee, current (836.1) [06/05/2005]: Chondromalacia, patella (M22.40) [07/16/2006]: Glaucoma  Hemorrhoids  Diverticulosis  Menopause  Thyroid Nodule  History of Goiter  Allergies No Known Drug Allergies  Family History  Congestive Heart Failure  mother Heart Disease  mother  Social History Alcohol use  never consumed alcohol Children  3 Current work status  retired Engineer, agricultural (Currently)  no Drug/Alcohol Rehab (Previously)  no Exercise  Exercises weekly; does other Illicit drug use  no Marital status  widowed Pain Contract  no Tobacco / smoke exposure  no Tobacco use  never smoker Psychologist, prison and probation services Will, Healthcare POA Post-Surgical Plans  Home  Medication History Vitamin C (500MG  Capsule, Oral) Active. Linzess (290MCG Capsule, Oral) Active. Travatan Z (0.004% Solution, Ophthalmic) Active. Simvastatin (20MG  Tablet, Oral) Active. Diazepam (5MG  Tablet, Oral) Active. Centrum Silver (Oral) Active. Calcium 600 (600MG  Tablet, Oral) Active. Aspirin Childrens (81MG  Tablet Chewable, Oral) Active. Fosamax (70MG  Tablet, Oral) Active. Alendronate Sodium (70MG  Tablet, Oral) Active. Culturelle Probiotic Active.   Past Surgical History Arthroscopy of Knee  Date: 2007. right Gallbladder Surgery  laporoscopic - 1990's Hysterectomy  Date: 1989. partial  (  non-cancerous)    Review of Systems  General Present- Chills, Fatigue and Night Sweats. Not Present- Fever, Memory Loss, Weight Gain and Weight Loss. Skin Not Present- Eczema, Hives, Itching, Lesions and Rash. HEENT Not Present- Dentures, Double Vision, Headache, Hearing Loss, Tinnitus and Visual Loss. Respiratory Not Present- Allergies, Chronic Cough, Coughing up blood, Shortness of breath at rest and Shortness of breath with exertion. Cardiovascular Not Present- Chest Pain, Difficulty Breathing Lying Down, Murmur, Palpitations, Racing/skipping heartbeats and Swelling. Gastrointestinal Present- Constipation. Not Present- Abdominal Pain, Bloody Stool, Diarrhea, Difficulty Swallowing, Heartburn, Jaundice, Loss of appetitie, Nausea and Vomiting. Female Genitourinary Not Present- Blood in Urine, Discharge, Flank Pain, Incontinence, Painful Urination, Urgency, Urinary frequency, Urinary Retention, Urinating at Night and Weak urinary stream. Musculoskeletal Present- Joint Pain, Joint Swelling, Morning Stiffness, Muscle Pain and Muscle Weakness. Not Present- Back Pain and Spasms. Neurological Not Present- Blackout spells, Difficulty with balance, Dizziness, Paralysis, Tremor and Weakness. Psychiatric Not Present- Insomnia.  Vitals Weight: 124 lb Height: 67in Weight was reported by patient. Height was reported by patient. Body Surface Area: 1.65 m Body Mass Index: 19.42 kg/m  Pulse: 84 (Regular)  BP: 142/86 (Sitting, Right Arm, Standard)  Physical Exam General Mental Status -Alert, cooperative and good historian. General Appearance-pleasant, Not in acute distress. Orientation-Oriented X3. Build & Nutrition-Well nourished and Well developed. Note: thin framed  Head and Neck Head-normocephalic, atraumatic . Neck Global Assessment - supple, no bruit auscultated on the right, no bruit auscultated on the left.  Eye Vision-Wears corrective lenses(readers). Pupil -  Bilateral-Regular and Round. Motion - Bilateral-EOMI.  Chest and Lung Exam Auscultation Breath sounds - clear at anterior chest wall and clear at posterior chest wall. Adventitious sounds - No Adventitious sounds.  Cardiovascular Auscultation Rhythm - Regular rate and rhythm. Heart Sounds - S1 WNL and S2 WNL. Murmurs & Other Heart Sounds - Auscultation of the heart reveals - No Murmurs.  Abdomen Palpation/Percussion Tenderness - Abdomen is non-tender to palpation. Rigidity (guarding) - Abdomen is soft. Auscultation Auscultation of the abdomen reveals - Bowel sounds normal.  Female Genitourinary Note: Not done, not pertinent to present illness   Musculoskeletal Note: On exam, she is a very pleasant, well-developed female, alert and oriented, in no apparent distress. Her hips show normal range of motion with no discomfort. Her right knee shows a slight valgus deformity. There is no effusion. Range is 5 to 125, but marked crepitus on range of motion, tenderness lateral greater than medial with no instability noted. Left knee exam is unremarkable. Range 0 to 125. Slight crepitus on range of motion but no tenderness or instability.  Her radiographs, AP both knees and lateral of the right show that she has bone-on-bone arthritis in the lateral and patellofemoral compartments of the right knee. This is gotten progressively worse compared to previous films.   Assessment & Plan  Primary osteoarthritis of right knee (M17.11)  Note:Surgical Plans: Right Total Knee Replacement  Disposition: Home  PCP: Dr. Tula Nakayama  IV TXA  Anesthesia Issues: None  Signed electronically by Ok Edwards, III PA-C

## 2016-05-26 NOTE — Transfer of Care (Signed)
Immediate Anesthesia Transfer of Care Note  Patient: Tamara Lam  Procedure(s) Performed: Procedure(s): RIGHT TOTAL KNEE ARTHROPLASTY (Right)  Patient Location: PACU  Anesthesia Type:Spinal  Level of Consciousness: awake, alert  and oriented  Airway & Oxygen Therapy: Patient Spontanous Breathing and Patient connected to face mask oxygen  Post-op Assessment: Report given to RN and Post -op Vital signs reviewed and stable  Post vital signs: Reviewed and stable  Last Vitals:  Vitals:   05/26/16 0712  BP: (!) 142/82  Pulse: 82  Resp: 16  Temp: 37 C    Last Pain:  Vitals:   05/26/16 0712  TempSrc: Oral      Patients Stated Pain Goal: 3 ([id number])  Complications: No apparent anesthesia complications

## 2016-05-26 NOTE — Evaluation (Signed)
Physical Therapy Evaluation Patient Details Name: Tamara Lam MRN: [id number] DOB: March 24, 1951 Today's Date: 05/26/2016   History of Present Illness  Pt is a 65 year old female s/p R TKA  Clinical Impression  Pt is s/p TKA resulting in the deficits listed below (see PT Problem List).  Pt will benefit from skilled PT to increase their independence and safety with mobility to allow discharge to the venue listed below.  Pt assisted with short distance ambulation POD #0 and plans to d/c home with assist from her daughters.     Follow Up Recommendations Home health PT    Equipment Recommendations  None recommended by PT    Recommendations for Other Services       Precautions / Restrictions Precautions Precautions: Knee Required Braces or Orthoses: Knee Immobilizer - Right Knee Immobilizer - Right: Discontinue once straight leg raise with < 10 degree lag Restrictions Other Position/Activity Restrictions: WBAT      Mobility  Bed Mobility Overal bed mobility: Needs Assistance Bed Mobility: Supine to Sit     Supine to sit: Min assist     General bed mobility comments: verbal cues for technique, assist for R LE  Transfers Overall transfer level: Needs assistance Equipment used: Rolling walker (2 wheeled) Transfers: Sit to/from Stand Sit to Stand: Min assist         General transfer comment: verbal cues for UE and LE positioning, assist to rise and control descent  Ambulation/Gait Ambulation/Gait assistance: Min guard Ambulation Distance (Feet): 50 Feet Assistive device: Rolling walker (2 wheeled) Gait Pattern/deviations: Step-to pattern;Decreased stance time - right;Antalgic     General Gait Details: verbal cues for sequence, RW positioning, step length, pt reports 5/10 pain R knee during gait  Stairs            Wheelchair Mobility    Modified Rankin (Stroke Patients Only)       Balance                                              Pertinent Vitals/Pain Pain Assessment: 0-10 Pain Score: 5  Pain Location: R Knee Pain Descriptors / Indicators: Aching;Sore Pain Intervention(s): Monitored during session;Limited activity within patient's tolerance;Repositioned;Premedicated before session    [place] expects to be discharged to:: Private residence Living Arrangements: Children   Type of Home: House Home Access: Stairs to enter   Technical brewer of Steps: 2 Home Layout: Other (Comment) (one step to [employer]) Home Equipment: Bedside commode;Walker - 2 wheels      Prior Function Level of Independence: Independent               Hand Dominance        Extremity/Trunk Assessment               Lower Extremity Assessment: RLE deficits/detail RLE Deficits / Details: able to perform SLR, maintained KI, ROM TBA       Communication   Communication: No difficulties  Cognition Arousal/Alertness: Awake/alert Behavior During Therapy: WFL for tasks assessed/performed Overall Cognitive Status: Within Functional Limits for tasks assessed                      General Comments      Exercises     Assessment/Plan    PT Assessment Patient needs continued PT services  PT Problem List Decreased strength;Decreased range  of motion;Decreased mobility;Decreased knowledge of use of DME;Decreased knowledge of precautions;Pain          PT Treatment Interventions DME instruction;Gait training;Stair training;Functional mobility training;Therapeutic exercise;Therapeutic activities;Patient/family education    PT Goals (Current goals can be found in the Care Plan section)  Acute Rehab PT Goals PT Goal Formulation: With patient Time For Goal Achievement: 05/30/16 Potential to Achieve Goals: Good    Frequency 7X/week   Barriers to discharge        Co-evaluation               End of Session Equipment Utilized During Treatment: Gait belt;Right knee  immobilizer Activity Tolerance: Patient tolerated treatment well Patient left: in chair;with chair alarm set;with call bell/phone within reach;with family/visitor present           Time: [id number] PT Time Calculation (min) (ACUTE ONLY): 12 min   Charges:   PT Evaluation $PT Eval Low Complexity: 1 Procedure     PT G Codes:        Jolan Upchurch,KATHrine E 05/26/2016, 5:35 PM Carmelia Bake, PT, DPT 05/26/2016 Pager: [phone]

## 2016-05-26 NOTE — Interval H&P Note (Signed)
History and Physical Interval Note:  05/26/2016 7:01 AM  Tamara Lam  has presented today for surgery, with the diagnosis of RIGHT KNEE OA  The various methods of treatment have been discussed with the patient and family. After consideration of risks, benefits and other options for treatment, the patient has consented to  Procedure(s): RIGHT TOTAL KNEE ARTHROPLASTY (Right) as a surgical intervention .  The patient's history has been reviewed, patient examined, no change in status, stable for surgery.  I have reviewed the patient's chart and labs.  Questions were answered to the patient's satisfaction.     Gearlean Alf

## 2016-05-27 ENCOUNTER — Encounter (HOSPITAL_COMMUNITY): Payer: Self-pay | Admitting: Orthopedic Surgery

## 2016-05-27 LAB — CBC
HCT: 30.8 % — ABNORMAL LOW (ref 36.0–46.0)
Hemoglobin: 9.6 g/dL — ABNORMAL LOW (ref 12.0–15.0)
MCH: 28.4 pg (ref 26.0–34.0)
MCHC: 31.2 g/dL (ref 30.0–36.0)
MCV: 91.1 fL (ref 78.0–100.0)
PLATELETS: 174 10*3/uL (ref 150–400)
RBC: 3.38 MIL/uL — ABNORMAL LOW (ref 3.87–5.11)
RDW: 14.8 % (ref 11.5–15.5)
WBC: 11.1 10*3/uL — ABNORMAL HIGH (ref 4.0–10.5)

## 2016-05-27 LAB — BASIC METABOLIC PANEL
Anion gap: 5 (ref 5–15)
BUN: 13 mg/dL (ref 6–20)
CALCIUM: 8.3 mg/dL — AB (ref 8.9–10.3)
CO2: 27 mmol/L (ref 22–32)
CREATININE: 0.77 mg/dL (ref 0.44–1.00)
Chloride: 106 mmol/L (ref 101–111)
GFR calc non Af Amer: >60 mL/min (ref >60)
Glucose, Bld: 131 mg/dL — ABNORMAL HIGH (ref 65–99)
Potassium: 3.6 mmol/L (ref 3.5–5.1)
SODIUM: 138 mmol/L (ref 135–145)

## 2016-05-27 MED ORDER — TRAMADOL HCL 50 MG PO TABS: 50.0000 mg | ORAL_TABLET | Freq: Four times a day (QID) | ORAL | 1 refills | Status: DC | PRN

## 2016-05-27 MED ORDER — RIVAROXABAN 10 MG PO TABS: 10.0000 mg | ORAL_TABLET | Freq: Every day | ORAL | 0 refills | Status: DC

## 2016-05-27 MED ORDER — SODIUM CHLORIDE 0.9 % IV BOLUS (SEPSIS)
250.0000 mL | Freq: Once | INTRAVENOUS | Status: AC
  Administered 2016-05-27: 250 mL via INTRAVENOUS

## 2016-05-27 MED ORDER — METHOCARBAMOL 500 MG PO TABS: 500.0000 mg | ORAL_TABLET | Freq: Four times a day (QID) | ORAL | 0 refills | Status: DC | PRN

## 2016-05-27 MED ORDER — OXYCODONE HCL 5 MG PO TABS: 5.0000 mg | ORAL_TABLET | ORAL | 0 refills | Status: DC | PRN

## 2016-05-27 NOTE — Progress Notes (Signed)
Physical Therapy Treatment Patient Details Name: Tamara Lam MRN: [id number] DOB: 1951/02/18 Today's Date: 05/27/2016    History of Present Illness Pt is a 65 year old female s/p R TKA    PT Comments    Pt ambulated in hallway and performed LE exercises.  Follow Up Recommendations  Home health PT     Equipment Recommendations  None recommended by PT    Recommendations for Other Services       Precautions / Restrictions Precautions Precautions: Knee Required Braces or Orthoses: Knee Immobilizer - Right Knee Immobilizer - Right: Discontinue once straight leg raise with < 10 degree lag Restrictions Weight Bearing Restrictions: No Other Position/Activity Restrictions: WBAT    Mobility  Bed Mobility Overal bed mobility: Modified Independent Bed Mobility: Supine to Sit           General bed mobility comments: pt up in recliner on arrival  Transfers Overall transfer level: Needs assistance Equipment used: Rolling walker (2 wheeled) Transfers: Sit to/from Stand Sit to Stand: Min guard         General transfer comment: verbal cues for safe technique  Ambulation/Gait Ambulation/Gait assistance: Min guard Ambulation Distance (Feet): 60 Feet Assistive device: Rolling walker (2 wheeled) Gait Pattern/deviations: Step-through pattern;Decreased stance time - right;Antalgic     General Gait Details: verbal cues for sequence, RW positioning, step length   Stairs            Wheelchair Mobility    Modified Rankin (Stroke Patients Only)       Balance Overall balance assessment: Needs assistance Sitting-balance support: Feet supported Sitting balance-Leahy Scale: Good     Standing balance support: Bilateral upper extremity supported;During functional activity Standing balance-Leahy Scale: Fair Standing balance comment: Pt able to stand at sink for 10 minutes to groom without holding to sink or walker at all times.                     Cognition Arousal/Alertness: Awake/alert Behavior During Therapy: WFL for tasks assessed/performed Overall Cognitive Status: Within Functional Limits for tasks assessed                      Exercises Total Joint Exercises Ankle Circles/Pumps: AROM;Both;10 reps Quad Sets: AROM;Both;10 reps Short Arc QuadSinclair Ship;Right;10 reps Heel Slides: AAROM;Right;10 reps Hip ABduction/ADduction: AROM;Right;10 reps Straight Leg Raises: AAROM;Right;10 reps Goniometric ROM: R knee AAROM 90* supine    General Comments General comments (skin integrity, edema, etc.): Spoke with pt about home safety concerns, throw rugs and use of 3:1 as shower chair and over commode.      Pertinent Vitals/Pain Pain Assessment: 0-10 Pain Score: 5  Pain Location: R knee Pain Descriptors / Indicators: Aching;Sore Pain Intervention(s): Limited activity within patient's tolerance;Monitored during session;Ice applied;Repositioned    Home Living Family/patient expects to be discharged to:: Private residence Living Arrangements: Children Available Help at Discharge: Family;Available 24 hours/day Type of Home: House Home Access: Stairs to enter   Home Layout: Other (Comment) (sunken down den with 1 step) Home Equipment: Bedside commode;Walker - 2 wheels      Prior Function Level of Independence: Independent          PT Goals (current goals can now be found in the care plan section) Acute Rehab PT Goals Patient Stated Goal: to go home soon Progress towards PT goals: Progressing toward goals    Frequency    7X/week      PT Plan Current plan remains appropriate  Co-evaluation             End of Session Equipment Utilized During Treatment: Right knee immobilizer Activity Tolerance: Patient tolerated treatment well Patient left: in chair;with chair alarm set;with call bell/phone within reach     Time: 1027-1048 PT Time Calculation (min) (ACUTE ONLY): 21 min  Charges:  $Therapeutic  Exercise: 8-22 mins                    G Codes:      [place] Fedora,KATHrine E 06-05-2016, 12:34 PM Carmelia Bake, PT, DPT Jun 05, 2016 Pager: [phone]

## 2016-05-27 NOTE — Progress Notes (Signed)
Physical Therapy Treatment Note    05/27/16 1500  PT Visit Information  Last PT Received On 05/27/16  Assistance Needed +1  History of Present Illness Pt is a 65 year old female s/p R TKA  Subjective Data  Subjective Pt ambulated in hallway again and assisted back to bed.  Pt reports nausea this afternoon and RN notified  Precautions  Precautions Knee  Required Braces or Orthoses Knee Immobilizer - Right  Knee Immobilizer - Right Discontinue once straight leg raise with < 10 degree lag  Restrictions  Other Position/Activity Restrictions WBAT  Pain Assessment  Pain Assessment 0-10  Pain Score 5  Pain Location R knee  Pain Descriptors / Indicators Aching;Sore  Pain Intervention(s) Limited activity within patient's tolerance;Monitored during session;Repositioned  Cognition  Arousal/Alertness Awake/alert  Behavior During Therapy WFL for tasks assessed/performed  Overall Cognitive Status Within Functional Limits for tasks assessed  Bed Mobility  Overal bed mobility Modified Independent  Transfers  Overall transfer level Needs assistance  Equipment used Rolling walker (2 wheeled)  Transfers Sit to/from Stand  Sit to Stand Min guard  General transfer comment verbal cues for safe technique  Ambulation/Gait  Ambulation/Gait assistance Min guard  Ambulation Distance (Feet) 40 Feet  Assistive device Rolling walker (2 wheeled)  Gait Pattern/deviations Step-to pattern;Antalgic;Decreased stance time - right  General Gait Details verbal cues for sequence, RW positioning, step length  PT - End of Session  Activity Tolerance Patient tolerated treatment well  Patient left with call bell/phone within reach;in bed;with family/visitor present  PT - Assessment/Plan  PT Plan Current plan remains appropriate  PT Frequency (ACUTE ONLY) 7X/week  Follow Up Recommendations Home health PT  PT equipment None recommended by PT  PT Goal Progression  Progress towards PT goals Progressing toward goals   PT Time Calculation  PT Start Time (ACUTE ONLY) 1348  PT Stop Time (ACUTE ONLY) 1400  PT Time Calculation (min) (ACUTE ONLY) 12 min  PT General Charges  $$ ACUTE PT VISIT 1 Procedure  PT Treatments  $Gait Training 8-22 mins   Carmelia Bake, PT, DPT 05/27/2016 Pager: [phone]

## 2016-05-27 NOTE — Discharge Instructions (Signed)
Information on my medicine - XARELTO® (Rivaroxaban) ° °This medication education was reviewed with me or my healthcare representative as part of my discharge preparation.  The pharmacist that spoke with me during my hospital stay was:  Marilyn Nihiser M, RPH ° °Why was Xarelto® prescribed for you? °Xarelto® was prescribed for you to reduce the risk of blood clots forming after orthopedic surgery. The medical term for these abnormal blood clots is venous thromboembolism (VTE). ° °What do you need to know about xarelto® ? °Take your Xarelto® ONCE DAILY at the same time every day. °You may take it either with or without food. ° °If you have difficulty swallowing the tablet whole, you may crush it and mix in applesauce just prior to taking your dose. ° °Take Xarelto® exactly as prescribed by your doctor and DO NOT stop taking Xarelto® without talking to the doctor who prescribed the medication.  Stopping without other VTE prevention medication to take the place of Xarelto® may increase your risk of developing a clot. ° °After discharge, you should have regular check-up appointments with your healthcare provider that is prescribing your Xarelto®.   ° °What do you do if you miss a dose? °If you miss a dose, take it as soon as you remember on the same day then continue your regularly scheduled once daily regimen the next day. Do not take two doses of Xarelto® on the same day.  ° °Important Safety Information °A possible side effect of Xarelto® is bleeding. You should call your healthcare provider right away if you experience any of the following: °? Bleeding from an injury or your nose that does not stop. °? Unusual colored urine (red or dark brown) or unusual colored stools (red or black). °? Unusual bruising for unknown reasons. °? A serious fall or if you hit your head (even if there is no bleeding). ° °Some medicines may interact with Xarelto® and might increase your risk of bleeding while on Xarelto®. To help avoid  this, consult your healthcare provider or pharmacist prior to using any new prescription or non-prescription medications, including herbals, vitamins, non-steroidal anti-inflammatory drugs (NSAIDs) and supplements. ° °This website has more information on Xarelto®: www.xarelto.com. ° ° °

## 2016-05-27 NOTE — Care Management Note (Signed)
Case Management Note  Patient Details  Name: Tamara Lam MRN: [id number] Date of Birth: 1951-01-14  Subjective/Objective:                  R TKA Action/Plan: Discharge planning Expected Discharge Date:  05/28/16               Expected Discharge Plan:  [place]  In-House Referral:     Discharge planning Services  CM Consult  Post Acute Care Choice:  Home Health Choice offered to:  Patient  DME Arranged:  N/A DME Agency:  NA  HH Arranged:  PT [place] Agency:  [facility] (formerly [facility]  Status of Service:  Completed, signed off  If discussed at [employer], dates discussed:    Additional Comments: CM met with pt in room to offer choice of home health agency.  Pt chooses [facility] to render HHPT.  Referral given to [facility] rep, Tim.  Pt states she has both rolling walker and 3n1 to room prior to discharge.  No other CM needs were communicated. Dellie Catholic, RN 05/27/2016, 4:23 PM

## 2016-05-27 NOTE — Progress Notes (Signed)
   Subjective: 1 Day Post-Op Procedure(s) (LRB): RIGHT TOTAL KNEE ARTHROPLASTY (Right) Patient reports pain as mild.   Patient seen in rounds for Dr. Wynelle Link.  Pressures were soft last night.  Fluids today. Patient is well, but has had some minor complaints of pain in the knee, requiring pain medications We will start therapy today.  Plan is to go Home after hospital stay.  Objective: Vital signs in last 24 hours: Temp:  [97.6 F (36.4 C)-98.6 F (37 C)] 98.2 F (36.8 C) (11/28 1430) Pulse Rate:  [68-87] 80 (11/28 1430) Resp:  [16-18] 18 (11/28 1430) BP: (95-111)/(45-66) 109/66 (11/28 1430) SpO2:  [99 %-100 %] 100 % (11/28 1430)  Intake/Output from previous day:  Intake/Output Summary (Last 24 hours) at 05/27/16 2033 Last data filed at 05/27/16 1815  Gross per 24 hour  Intake              720 ml  Output             1635 ml  Net             -915 ml    Intake/Output this shift: No intake/output data recorded.  Labs:  Recent Labs  05/27/16 0417  HGB 9.6*    Recent Labs  05/27/16 0417  WBC 11.1*  RBC 3.38*  HCT 30.8*  PLT 174    Recent Labs  05/27/16 0417  NA 138  K 3.6  CL 106  CO2 27  BUN 13  CREATININE 0.77  GLUCOSE 131*  CALCIUM 8.3*   No results for input(s): LABPT, INR in the last 72 hours.  EXAM General - Patient is Alert, Appropriate and Oriented Extremity - Neurovascular intact Sensation intact distally Intact pulses distally Dorsiflexion/Plantar flexion intact Dressing - dressing C/D/I Motor Function - intact, moving foot and toes well on exam.  Hemovac pulled without difficulty.  Past Medical History:  Diagnosis Date  . Abnormal CXR (chest x-ray)    Evaluated with Pulmonary- Dr. Luan Pulling- "pt. was told probably due to thin body frame"-saw no issues-PFT test normal.  . Anxiety 1980's   . Arthritis    osteoarthritis -knees, hands  . Chronic constipation    tx. Linzess  . Chronic nausea   . Diabetes mellitus without complication  ([place])    [id number] level checked borderline x1, then further evaluated -Diabetes ruled out.  . Glaucoma 2004   Dr. Venetia Maxon, laser to left eye Sept 2011, bilateral eye drops for this  . Helicobacter pylori gastritis 04/2004   last EGD   . Hematochezia   . Hx of colonoscopy 08/2005   Dr. Gala Romney / hemorrhoids   . Thyroid disease    thyroid goiter, nodules-no problems.  . Weight loss     Assessment/Plan: 1 Day Post-Op Procedure(s) (LRB): RIGHT TOTAL KNEE ARTHROPLASTY (Right) Principal Problem:   OA (osteoarthritis) of knee  Estimated body mass index is 19.17 kg/m as calculated from the following:   Height as of this encounter: 5\' 7"  (1.702 m).   Weight as of this encounter: 55.5 kg (122 lb 6 oz). Advance diet Up with therapy Plan for discharge tomorrow Discharge home with home health  DVT Prophylaxis - Xarelto Weight-Bearing as tolerated to right leg D/C O2 and Pulse OX and try on Room Air  Arlee Muslim, PA-C Orthopaedic Surgery 05/27/2016, 8:33 PM

## 2016-05-27 NOTE — Evaluation (Signed)
Occupational Therapy Evaluation Patient Details Name: Tamara Lam MRN: [id number] DOB: 1950/09/18 Today's Date: 05/27/2016    History of Present Illness Pt is a 65 year old female s/p R TKA   Clinical Impression   Pt admitted for the above diagnosis and has the deficits listed below. Pt would benefit from cont acute OT to address shower transfers so she is comfortable at home getting into and out of the shower with her children present.  Pt will d/c home with 24 hour S from her children and should be ready to do so anytime from an OT standpoint.    Follow Up Recommendations  No OT follow up;Supervision/Assistance - 24 hour    Equipment Recommendations  None recommended by OT    Recommendations for Other Services       Precautions / Restrictions Precautions Precautions: Knee Required Braces or Orthoses: Knee Immobilizer - Right Knee Immobilizer - Right: Discontinue once straight leg raise with < 10 degree lag Restrictions Weight Bearing Restrictions: No Other Position/Activity Restrictions: WBAT      Mobility Bed Mobility Overal bed mobility: Modified Independent Bed Mobility: Supine to Sit           General bed mobility comments: HOB at 20 degrees and pt used bed rail.  Transfers Overall transfer level: Needs assistance Equipment used: Rolling walker (2 wheeled) Transfers: Sit to/from Stand Sit to Stand: Min guard         General transfer comment: VCs for hand placement only.    Balance Overall balance assessment: Needs assistance Sitting-balance support: Feet supported Sitting balance-Leahy Scale: Good     Standing balance support: Bilateral upper extremity supported;During functional activity Standing balance-Leahy Scale: Fair Standing balance comment: Pt able to stand at sink for 10 minutes to groom without holding to sink or walker at all times.                            ADL Overall ADL's : Needs  assistance/impaired Eating/Feeding: Independent;Sitting   Grooming: Wash/dry hands;Wash/dry face;Oral care;Applying deodorant;Brushing hair;Supervision/safety;Standing   Upper Body Bathing: Set up;Sitting   Lower Body Bathing: Minimal assistance;Sit to/from stand   Upper Body Dressing : Set up;Sitting   Lower Body Dressing: Minimal assistance;Sit to/from stand Lower Body Dressing Details (indicate cue type and reason): min assist for sock and shoe partly due to knee immobilizer Toilet Transfer: Min guard;Ambulation;RW;Comfort height toilet;Grab bars   Toileting- Clothing Manipulation and Hygiene: Supervision/safety;Sitting/lateral lean   Tub/ Shower Transfer: Walk-in shower;Rolling walker (educated in technique. Too tired to attempt)   Functional mobility during ADLs: Min guard;Rolling walker General ADL Comments: Pt did well with all adls.  Requires assist to reach R foot at this time due to pain.     Vision Vision Assessment?: No apparent visual deficits   Perception     Praxis      Pertinent Vitals/Pain Pain Assessment: 0-10 Pain Score: 6  Pain Location: back of R knee Pain Descriptors / Indicators: Aching;Operative site guarding Pain Intervention(s): Limited activity within patient's tolerance;Monitored during session;Premedicated before session;Repositioned;Ice applied     Hand Dominance Right   Extremity/Trunk Assessment Upper Extremity Assessment Upper Extremity Assessment: Overall WFL for tasks assessed   Lower Extremity Assessment Lower Extremity Assessment: Defer to PT evaluation       Communication Communication Communication: No difficulties   Cognition Arousal/Alertness: Awake/alert Behavior During Therapy: WFL for tasks assessed/performed Overall Cognitive Status: Within Functional Limits for tasks assessed  General Comments       Exercises       Shoulder Instructions      Home Living Family/patient expects to  be discharged to:: Private residence Living Arrangements: Children Available Help at Discharge: Family;Available 24 hours/day Type of Home: House Home Access: Stairs to enter [employer] of Steps: 2   Home Layout: Other (Comment) (sunken down den with 1 step)     Bathroom Shower/Tub: Walk-in Hydrologist: Standard     Home Equipment: Bedside commode;Walker - 2 wheels          Prior Functioning/Environment Level of Independence: Independent                 OT Problem List: Decreased knowledge of use of DME or AE;Pain;Impaired balance (sitting and/or standing)   OT Treatment/Interventions: Self-care/ADL training;Therapeutic activities    OT Goals(Current goals can be found in the care plan section) Acute Rehab OT Goals Patient Stated Goal: to go home soon OT Goal Formulation: With patient Time For Goal Achievement: 06/03/16 Potential to Achieve Goals: Good ADL Goals Pt Will Perform Tub/Shower Transfer: Shower transfer;3 in 1;ambulating;grab bars;rolling walker;with min guard assist  OT Frequency: Min 2X/week   Barriers to D/C:            Co-evaluation              End of Session Equipment Utilized During Treatment: Rolling walker;Right knee immobilizer Nurse Communication: Mobility status  Activity Tolerance: Patient tolerated treatment well Patient left: in chair;with call bell/phone within reach;with chair alarm set   Time: 0925-1001 OT Time Calculation (min): 36 min Charges:  OT General Charges $OT Visit: 1 Procedure OT Evaluation $OT Eval Moderate Complexity: 1 Procedure OT Treatments $Self Care/Home Management : 8-22 mins G-Codes:    Glenford Peers 06-08-2016, 10:14 AM  [phone]

## 2016-05-28 LAB — BASIC METABOLIC PANEL
Anion gap: 6 (ref 5–15)
BUN: 8 mg/dL (ref 6–20)
CALCIUM: 8.6 mg/dL — AB (ref 8.9–10.3)
CO2: 29 mmol/L (ref 22–32)
CREATININE: 0.57 mg/dL (ref 0.44–1.00)
Chloride: 106 mmol/L (ref 101–111)
GFR calc non Af Amer: >60 mL/min (ref >60)
Glucose, Bld: 120 mg/dL — ABNORMAL HIGH (ref 65–99)
Potassium: 3.4 mmol/L — ABNORMAL LOW (ref 3.5–5.1)
SODIUM: 141 mmol/L (ref 135–145)

## 2016-05-28 LAB — CBC
HEMATOCRIT: 28.3 % — AB (ref 36.0–46.0)
Hemoglobin: 9 g/dL — ABNORMAL LOW (ref 12.0–15.0)
MCH: 28.8 pg (ref 26.0–34.0)
MCHC: 31.8 g/dL (ref 30.0–36.0)
MCV: 90.7 fL (ref 78.0–100.0)
Platelets: 156 10*3/uL (ref 150–400)
RBC: 3.12 MIL/uL — ABNORMAL LOW (ref 3.87–5.11)
RDW: 15 % (ref 11.5–15.5)
WBC: 11.1 10*3/uL — ABNORMAL HIGH (ref 4.0–10.5)

## 2016-05-28 NOTE — Progress Notes (Signed)
Physical Therapy Treatment Patient Details Name: Tamara Lam MRN: [id number] DOB: [birth year] Today's Date: 05/28/2016    History of Present Illness Pt is a 65 year old female s/p R TKA    PT Comments    Patient was seen in bed upon arrival. Educated daughter on donning/doffing knee immobilizer. Performed supine to sit x min guard/minA to support RLE. Sit to stand x min guard with VC's for UE/LE placement. Gait x 20 ft to stairs. Performed stairs x 2 x 2 repsx minA with education to daughter on walker placement when performing stairs and where to be placed when ascending/descending stairs backwards with no rail to ensure pt safety upon D/C. Educated daughter on HEP. Patient restated how many times a day to perform HEP and when to wear/discontinue knee immobilizer. Patient plans to D/C home this afternoon.  Follow Up Recommendations  Home health PT     Equipment Recommendations  None recommended by PT    Recommendations for Other Services       Precautions / Restrictions Precautions Precautions: Knee Precaution Comments: educated patient/daughter on how to apply KI and when to wear/discontinue. Patient was able to repeat back. Required Braces or Orthoses: Knee Immobilizer - Right Knee Immobilizer - Right: Discontinue once straight leg raise with < 10 degree lag Restrictions Weight Bearing Restrictions: No Other Position/Activity Restrictions: WBAT    Mobility  Bed Mobility Overal bed mobility: Needs Assistance Bed Mobility: Supine to Sit     Supine to sit: Min assist     General bed mobility comments: Required minA to support RLE when moving in bed.   Transfers Overall transfer level: Needs assistance Equipment used: Rolling walker (2 wheeled) Transfers: Sit to/from Stand Sit to Stand: Min guard Stand pivot transfers: Supervision       General transfer comment: VC's for UE/LE placement.   Ambulation/Gait Ambulation/Gait assistance: Min guard Ambulation  Distance (Feet): 20 Feet Assistive device: Rolling walker (2 wheeled) Gait Pattern/deviations: Step-to pattern;Decreased stance time - right;Trunk flexed Gait velocity: decreased Gait velocity interpretation: Below normal speed for age/gender General Gait Details: VC's for LE positioning.   Stairs Stairs: Yes Stairs assistance: Min assist Stair Management: No rails Number of Stairs: 2 General stair comments: Educated pt daughter on sequencing ascending/descending stairs and where she needs to be placed during transfer to ensure patient safety.   Wheelchair Mobility    Modified Rankin (Stroke Patients Only)       Balance                                    Cognition Arousal/Alertness: Awake/alert Behavior During Therapy: WFL for tasks assessed/performed Overall Cognitive Status: Within Functional Limits for tasks assessed                      Exercises    General Comments        Pertinent Vitals/Pain Pain Assessment: Faces Pain Score: 4 Faces Pain Scale: Hurts little more Pain Location: R knee Pain Descriptors / Indicators: Sore Pain Intervention(s): Limited activity within patient's tolerance;Premedicated before session;Repositioned;Ice applied    Home Living                      Prior Function            PT Goals (current goals can now be found in the care plan section) Progress towards PT goals: Progressing toward goals  Frequency    7X/week      PT Plan Current plan remains appropriate    Co-evaluation             End of Session Equipment Utilized During Treatment: Right knee immobilizer Activity Tolerance: Patient tolerated treatment well Patient left: with call bell/phone within reach;in bed;with family/visitor present     Time: 12:42 - 13:00 PT Time Calculation (min) (ACUTE ONLY): 28 min  Charges:  $Gait Training: 8-22 mins                    G Codes:      Hall Busing, SPTA [facility] Acute  Rehab [phone]  Present and agree with above  Rica Koyanagi  PTA [facility]  Acute  Rehab Pager      [phone]

## 2016-05-28 NOTE — Progress Notes (Signed)
Physical Therapy Treatment Patient Details Name: Tamara Lam MRN: [id number] DOB: [birth year] Today's Date: 05/28/2016    History of Present Illness Pt is a 65 year old female s/p R TKA    PT Comments    Patient was seen in bed upon arrival. Pain was rated at a 6/10 upon arrival. Pain medication given during session. Performed supine to sit x min guard with minA required to support RLE while moving in bed. Sit to stand x min guard with VC's for safe UE/LE placement. Gait x 24 ft x min guard with VC's for LE positioning within the walker during ambulation and safe maneuvering of turns. Performed stairs x 2 x minA with VC's for safe walker placement and sequencing ascending/descending stairs backwards with no rails. Returned to room and performed HEP x 10 reps. Ice applied following therapy to R knee. Will return this afternoon when daughter is present to go over HEP and perform stairs.   Follow Up Recommendations        Equipment Recommendations       Recommendations for Other Services       Precautions / Restrictions Precautions Precautions: Knee Precaution Comments: educated patient on how to apply [place] and when to wear/discontinue. Required Braces or Orthoses: Knee Immobilizer - Right Knee Immobilizer - Right: Discontinue once straight leg raise with < 10 degree lag Restrictions Weight Bearing Restrictions: No Other Position/Activity Restrictions: WBAT    Mobility  Bed Mobility Overal bed mobility: Needs Assistance Bed Mobility: Supine to Sit     Supine to sit: Min assist     General bed mobility comments: Required minA to support RLE when moving in bed.   Transfers Overall transfer level: Needs assistance Equipment used: Rolling walker (2 wheeled) Transfers: Sit to/from Stand Sit to Stand: Min guard Stand pivot transfers: Supervision       General transfer comment: VC's for UE/LE placement.   Ambulation/Gait Ambulation/Gait assistance: Min assist Ambulation  Distance (Feet): 24 Feet Assistive device: Rolling walker (2 wheeled) Gait Pattern/deviations: Step-to pattern;Trunk flexed;Decreased stride length;Decreased stance time - right Gait velocity: decreased Gait velocity interpretation: Below normal speed for age/gender General Gait Details: VC's for LE positioning within the walker during ambulation and maneuvering turns.    Stairs Stairs: Yes Stairs assistance: Min assist Stair Management: No rails Number of Stairs: 2 General stair comments: VC's for sequencing ascending/descending stairs.   Wheelchair Mobility    Modified Rankin (Stroke Patients Only)       Balance                                    Cognition Arousal/Alertness: Awake/alert Behavior During Therapy: WFL for tasks assessed/performed Overall Cognitive Status: Within Functional Limits for tasks assessed                      Exercises Total Joint Exercises Ankle Circles/Pumps: AROM;Both;10 reps;Supine Quad Sets: AROM;Both;10 reps;Supine Towel Squeeze: AROM;Supine;Both;10 reps Short Arc QuadSinclair Ship;Right;10 reps;Supine Heel Slides: AAROM;Right;10 reps;Supine Hip ABduction/ADduction: AROM;Right;10 reps;Supine Straight Leg Raises: AAROM;Right;10 reps;Supine    General Comments        Pertinent Vitals/Pain Pain Assessment: 0-10 Pain Score: 6  Pain Location: R knee Pain Descriptors / Indicators: Sore Pain Intervention(s): Monitored during session;Limited activity within patient's tolerance;RN gave pain meds during session;Repositioned;Ice applied    Home Living  Prior Function            PT Goals (current goals can now be found in the care plan section) Progress towards PT goals: Progressing toward goals    Frequency           PT Plan      Co-evaluation             End of Session Equipment Utilized During Treatment: Right knee immobilizer Activity Tolerance: Patient tolerated  treatment well Patient left: with call bell/phone within reach;in bed;with family/visitor present     Time: [id number] PT Time Calculation (min) (ACUTE ONLY): 28 min  Charges:  $Gait Training: 8-22 mins $Therapeutic Exercise: 8-22 mins                    G Codes:      Hall Busing, SPTA [facility] Acute Rehab [phone]  Present and agree with above  Rica Koyanagi  PTA [facility]  Re[facility] Pager      [phone]

## 2016-05-28 NOTE — Progress Notes (Signed)
Occupational Therapy Treatment Patient Details Name: Tamara Lam MRN: [id number] DOB: [birth year] Today's Date: 05/28/2016    History of present illness Pt is a 65 year old female s/p R TKA   OT comments  Pt ready to DC from OT standpoint  Follow Up Recommendations  No OT follow up;Supervision/Assistance - 24 hour    Equipment Recommendations  None recommended by OT       Precautions / Restrictions Precautions Precautions: Knee Required Braces or Orthoses: Knee Immobilizer - Right Knee Immobilizer - Right: Discontinue once straight leg raise with < 10 degree lag Restrictions Weight Bearing Restrictions: No Other Position/Activity Restrictions: WBAT       Mobility Bed Mobility Overal bed mobility: Modified Independent                Transfers Overall transfer level: Needs assistance Equipment used: Rolling walker (2 wheeled) Transfers: Sit to/from [facility] Sit to Stand: Supervision Stand pivot transfers: Supervision       General transfer comment: verbal cues for safe technique        ADL       Grooming: Wash/dry face;Oral care;Standing;Supervision/safety       Lower Body Bathing: Set up;Sit to/from stand   Upper Body Dressing : Set up;Sitting   Lower Body Dressing: Set up;Cueing for safety;Cueing for sequencing;Sit to/from stand   Toilet Transfer: Supervision/safety;RW;Ambulation;Cueing for safety;Comfort height toilet   Toileting- Clothing Manipulation and Hygiene: Supervision/safety;Sit to/from stand;Cueing for safety;Cueing for sequencing   Tub/ Shower Transfer: Walk-in shower;Rolling walker;Supervision/safety                      Cognition   Behavior During Therapy: WFL for tasks assessed/performed Overall Cognitive Status: Within Functional Limits for tasks assessed                                    Pertinent Vitals/ Pain       Pain Score: 3  Pain Descriptors / Indicators: Sore Pain  Intervention(s): Monitored during session;Repositioned         Frequency  Min 2X/week        Progress Toward Goals  OT Goals(current goals can now be found in the care plan section)  Progress towards OT goals: Progressing toward goals     Plan Discharge plan remains appropriate       End of Session Equipment Utilized During Treatment: Rolling walker;Right knee immobilizer CPM Right Knee CPM Right Knee: Off   Activity Tolerance Patient tolerated treatment well   Patient Left in chair;with call bell/phone within reach;with chair alarm set   Nurse Communication Mobility status        Time: 1005-1047 OT Time Calculation (min): 42 min  Charges: OT General Charges $OT Visit: 1 Procedure OT Treatments $Self Care/Home Management : 38-52 mins  Electra, Thereasa Parkin 05/28/2016, 11:19 AM

## 2016-05-28 NOTE — Progress Notes (Signed)
   Subjective: 2 Days Post-Op Procedure(s) (LRB): RIGHT TOTAL KNEE ARTHROPLASTY (Right) Patient reports pain as mild.   Patient seen in rounds for Dr. Wynelle Link. Patient is well, and has had no acute complaints or problems Patient is ready to go home  Objective: Vital signs in last 24 hours: Temp:  [98.1 F (36.7 C)-98.8 F (37.1 C)] 98.1 F (36.7 C) (11/29 0615) Pulse Rate:  [79-89] 89 (11/29 0615) Resp:  [18] 18 (11/29 0615) BP: (109-112)/(45-66) 110/61 (11/29 0615) SpO2:  [100 %] 100 % (11/29 0615)  Intake/Output from previous day:  Intake/Output Summary (Last 24 hours) at 05/28/16 0933 Last data filed at 05/28/16 0344  Gross per 24 hour  Intake              360 ml  Output             1501 ml  Net            -1141 ml    Intake/Output this shift: No intake/output data recorded.  Labs:  Recent Labs  05/27/16 0417 05/28/16 0435  HGB 9.6* 9.0*    Recent Labs  05/27/16 0417 05/28/16 0435  WBC 11.1* 11.1*  RBC 3.38* 3.12*  HCT 30.8* 28.3*  PLT 174 156    Recent Labs  05/27/16 0417 05/28/16 0435  NA 138 141  K 3.6 3.4*  CL 106 106  CO2 27 29  BUN 13 8  CREATININE 0.77 0.57  GLUCOSE 131* 120*  CALCIUM 8.3* 8.6*   No results for input(s): LABPT, INR in the last 72 hours.  EXAM: General - Patient is Alert, Appropriate and Oriented Extremity - Neurovascular intact Sensation intact distally Incision - clean, dry Motor Function - intact, moving foot and toes well on exam.   Assessment/Plan: 2 Days Post-Op Procedure(s) (LRB): RIGHT TOTAL KNEE ARTHROPLASTY (Right) Procedure(s) (LRB): RIGHT TOTAL KNEE ARTHROPLASTY (Right) Past Medical History:  Diagnosis Date  . Abnormal CXR (chest x-ray)    Evaluated with Pulmonary- Dr. Luan Pulling- "pt. was told probably due to thin body frame"-saw no issues-PFT test normal.  . Anxiety 1980's   . Arthritis    osteoarthritis -knees, hands  . Chronic constipation    tx. Linzess  . Chronic nausea   . Diabetes  mellitus without complication ([place])    [id number] level checked borderline x1, then further evaluated -Diabetes ruled out.  . Glaucoma 2004   Dr. Venetia Maxon, laser to left eye Sept 2011, bilateral eye drops for this  . Helicobacter pylori gastritis 04/2004   last EGD   . Hematochezia   . Hx of colonoscopy 08/2005   Dr. Gala Romney / hemorrhoids   . Thyroid disease    thyroid goiter, nodules-no problems.  . Weight loss    Principal Problem:   OA (osteoarthritis) of knee  Estimated body mass index is 19.17 kg/m as calculated from the following:   Height as of this encounter: 5\' 7"  (1.702 m).   Weight as of this encounter: 55.5 kg (122 lb 6 oz). Up with therapy Diet - Diabetic diet Follow up - in 2 weeks Activity - WBAT Disposition - Home Condition Upon Discharge - Good D/C Meds - See DC Summary DVT Prophylaxis - Xarelto  Arlee Muslim, PA-C Orthopaedic Surgery 05/28/2016, 9:33 AM

## 2016-05-29 DIAGNOSIS — Z7901 Long term (current) use of anticoagulants: Secondary | ICD-10-CM | POA: Diagnosis not present

## 2016-05-29 DIAGNOSIS — Z96651 Presence of right artificial knee joint: Secondary | ICD-10-CM | POA: Diagnosis not present

## 2016-05-29 DIAGNOSIS — Z79891 Long term (current) use of opiate analgesic: Secondary | ICD-10-CM | POA: Diagnosis not present

## 2016-05-29 DIAGNOSIS — Z471 Aftercare following joint replacement surgery: Secondary | ICD-10-CM | POA: Diagnosis not present

## 2016-05-30 DIAGNOSIS — Z96651 Presence of right artificial knee joint: Secondary | ICD-10-CM | POA: Diagnosis not present

## 2016-05-30 DIAGNOSIS — Z471 Aftercare following joint replacement surgery: Secondary | ICD-10-CM | POA: Diagnosis not present

## 2016-05-30 DIAGNOSIS — Z7901 Long term (current) use of anticoagulants: Secondary | ICD-10-CM | POA: Diagnosis not present

## 2016-05-30 DIAGNOSIS — Z79891 Long term (current) use of opiate analgesic: Secondary | ICD-10-CM | POA: Diagnosis not present

## 2016-05-30 NOTE — Discharge Summary (Signed)
Physician Discharge Summary   Patient ID: SECILY Lam MRN: [id number] DOB/AGE: [birth year] 65 y.o.  Admit date: 05/26/2016 Discharge date: 05/28/2016  Primary Diagnosis:  Osteoarthritis Right knee(s)  Admission Diagnoses:  Past Medical History:  Diagnosis Date  . Abnormal CXR (chest x-ray)    Evaluated with Pulmonary- Dr. Luan Pulling- "pt. was told probably due to thin body frame"-saw no issues-PFT test normal.  . Anxiety 1980's   . Arthritis    osteoarthritis -knees, hands  . Chronic constipation    tx. Linzess  . Chronic nausea   . Diabetes mellitus without complication ([place])    T4S level checked borderline x1, then further evaluated -Diabetes ruled out.  . Glaucoma 2004   Dr. Venetia Maxon, laser to left eye Sept 2011, bilateral eye drops for this  . Helicobacter pylori gastritis 04/2004   last EGD   . Hematochezia   . Hx of colonoscopy 08/2005   Dr. Gala Romney / hemorrhoids   . Thyroid disease    thyroid goiter, nodules-no problems.  . Weight loss    Discharge Diagnoses:   Principal Problem:   OA (osteoarthritis) of knee  Estimated body mass index is 19.17 kg/m as calculated from the following:   Height as of this encounter: '5\' 7"'  (1.702 m).   Weight as of this encounter: 55.5 kg (122 lb 6 oz).  Procedure:  Procedure(s) (LRB): RIGHT TOTAL KNEE ARTHROPLASTY (Right)   Consults: None  HPI: Tamara Lam is a 65 y.o. year old female with end stage OA of her right knee with progressively worsening pain and dysfunction. She has constant pain, with activity and at rest and significant functional deficits with difficulties even with ADLs. She has had extensive non-op management including analgesics, injections of cortisone and viscosupplements, and home exercise program, but remains in significant pain with significant dysfunction.Radiographs show bone on bone arthritis lateral and patellofemoral with valgus deformity. She presents now for right Total Knee Arthroplasty.    Laboratory Data: Admission on 05/26/2016, Discharged on 05/28/2016  Component Date Value Ref Range Status  . WBC 05/27/2016 11.1* 4.0 - 10.5 K/uL Final  . RBC 05/27/2016 3.38* 3.87 - 5.11 MIL/uL Final  . Hemoglobin 05/27/2016 9.6* 12.0 - 15.0 g/dL Final  . HCT 05/27/2016 30.8* 36.0 - 46.0 % Final  . MCV 05/27/2016 91.1  78.0 - 100.0 fL Final  . MCH 05/27/2016 28.4  26.0 - 34.0 pg Final  . MCHC 05/27/2016 31.2  30.0 - 36.0 g/dL Final  . RDW 05/27/2016 14.8  11.5 - 15.5 % Final  . Platelets 05/27/2016 174  150 - 400 K/uL Final  . Sodium 05/27/2016 138  135 - 145 mmol/L Final  . Potassium 05/27/2016 3.6  3.5 - 5.1 mmol/L Final  . Chloride 05/27/2016 106  101 - 111 mmol/L Final  . CO2 05/27/2016 27  22 - 32 mmol/L Final  . Glucose, Bld 05/27/2016 131* 65 - 99 mg/dL Final  . BUN 05/27/2016 13  6 - 20 mg/dL Final  . Creatinine, Ser 05/27/2016 0.77  0.44 - 1.00 mg/dL Final  . Calcium 05/27/2016 8.3* 8.9 - 10.3 mg/dL Final  . GFR calc non Af Amer 05/27/2016 >60  >60 mL/min Final  . GFR calc Af Amer 05/27/2016 >60  >60 mL/min Final   Comment: (NOTE) The eGFR has been calculated using the CKD EPI equation. This calculation has not been validated in all clinical situations. eGFR's persistently <60 mL/min signify possible Chronic Kidney Disease.   . Anion gap 05/27/2016 5  5 -  15 Final  . WBC 05/28/2016 11.1* 4.0 - 10.5 K/uL Final  . RBC 05/28/2016 3.12* 3.87 - 5.11 MIL/uL Final  . Hemoglobin 05/28/2016 9.0* 12.0 - 15.0 g/dL Final  . HCT 05/28/2016 28.3* 36.0 - 46.0 % Final  . MCV 05/28/2016 90.7  78.0 - 100.0 fL Final  . MCH 05/28/2016 28.8  26.0 - 34.0 pg Final  . MCHC 05/28/2016 31.8  30.0 - 36.0 g/dL Final  . RDW 05/28/2016 15.0  11.5 - 15.5 % Final  . Platelets 05/28/2016 156  150 - 400 K/uL Final  . Sodium 05/28/2016 141  135 - 145 mmol/L Final  . Potassium 05/28/2016 3.4* 3.5 - 5.1 mmol/L Final  . Chloride 05/28/2016 106  101 - 111 mmol/L Final  . CO2 05/28/2016 29  22 - 32  mmol/L Final  . Glucose, Bld 05/28/2016 120* 65 - 99 mg/dL Final  . BUN 05/28/2016 8  6 - 20 mg/dL Final  . Creatinine, Ser 05/28/2016 0.57  0.44 - 1.00 mg/dL Final  . Calcium 05/28/2016 8.6* 8.9 - 10.3 mg/dL Final  . GFR calc non Af Amer 05/28/2016 >60  >60 mL/min Final  . GFR calc Af Amer 05/28/2016 >60  >60 mL/min Final   Comment: (NOTE) The eGFR has been calculated using the CKD EPI equation. This calculation has not been validated in all clinical situations. eGFR's persistently <60 mL/min signify possible Chronic Kidney Disease.   Georgiann Hahn gap 05/28/2016 6  5 - 15 Final  Hospital Outpatient Visit on 05/16/2016  Component Date Value Ref Range Status  . MRSA, PCR 05/16/2016 NEGATIVE  NEGATIVE Final  . Staphylococcus aureus 05/16/2016 NEGATIVE  NEGATIVE Final   Comment:        The Xpert SA Assay (FDA approved for NASAL specimens in patients over 12 years of age), is one component of a comprehensive surveillance program.  Test performance has been validated by [facility] for patients greater than or equal to 70 year old. It is not intended to diagnose infection nor to guide or monitor treatment.   Marland Kitchen aPTT 05/16/2016 29  24 - 36 seconds Final  . Prothrombin Time 05/16/2016 14.0  11.4 - 15.2 seconds Final  . INR 05/16/2016 1.07   Final  . ABO/RH(D) 05/26/2016 O POS   Final  . Antibody Screen 05/26/2016 NEG   Final  . Sample Expiration 05/26/2016 05/29/2016   Final  . Extend sample reason 05/26/2016 NO TRANSFUSIONS OR PREGNANCY IN THE PAST 3 MONTHS   Final  . Color, Urine 05/16/2016 YELLOW  YELLOW Final  . APPearance 05/16/2016 CLEAR  CLEAR Final  . Specific Gravity, Urine 05/16/2016 1.009  1.005 - 1.030 Final  . pH 05/16/2016 5.5  5.0 - 8.0 Final  . Glucose, UA 05/16/2016 NEGATIVE  NEGATIVE mg/dL Final  . Hgb urine dipstick 05/16/2016 TRACE* NEGATIVE Final  . Bilirubin Urine 05/16/2016 NEGATIVE  NEGATIVE Final  . Ketones, ur 05/16/2016 NEGATIVE  NEGATIVE mg/dL Final  .  Protein, ur 05/16/2016 NEGATIVE  NEGATIVE mg/dL Final  . Nitrite 05/16/2016 NEGATIVE  NEGATIVE Final  . Leukocytes, UA 05/16/2016 NEGATIVE  NEGATIVE Final  . ABO/RH(D) 05/16/2016 O POS   Final  . Squamous Epithelial / LPF 05/16/2016 0-5* NONE SEEN Final  . WBC, UA 05/16/2016 NONE SEEN  0 - 5 WBC/hpf Final  . RBC / HPF 05/16/2016 0-5  0 - 5 RBC/hpf Final  . Bacteria, UA 05/16/2016 NONE SEEN  NONE SEEN Final  Office Visit on 05/12/2016  Component Date Value  Ref Range Status  . Card #1 Date 05/12/2016 05/12/2016   Final  . Fecal Occult Blood, POC 05/12/2016 Negative  Negative Final  . Color, UA 05/12/2016 yellow   Final  . Clarity, UA 05/12/2016 clear   Final  . Glucose, UA 05/12/2016 neg   Final  . Bilirubin, UA 05/12/2016 neg   Final  . Ketones, UA 05/12/2016 neg   Final  . Spec Grav, UA 05/12/2016 1.010   Final  . Blood, UA 05/12/2016 neg   Final  . pH, UA 05/12/2016 5.5   Final  . Protein, UA 05/12/2016 neg   Final  . Urobilinogen, UA 05/12/2016 0.2   Final  . Nitrite, UA 05/12/2016 neg   Final  . Leukocytes, UA 05/12/2016 Negative  Negative Final     X-Rays:Dg Chest 2 View  Result Date: 05/05/2016 CLINICAL DATA:  Preop knee replacement surgery EXAM: CHEST  2 VIEW COMPARISON:  10/19/2014 FINDINGS: Pulmonary hyperinflation which is more prominent compared with the prior study. Negative for pneumonia. Heart size upper normal. Negative for heart failure. Lungs remain clear without infiltrate effusion or mass. IMPRESSION: Pulmonary hyperinflation has progressed in the interval. No other acute abnormality. Electronically Signed   By: Franchot Gallo M.D.   On: 05/05/2016 11:58   Dg Forearm Right  Result Date: 05/16/2016 CLINICAL DATA:  Pt c/o aching pain in Rt forearm x 4 wks without injury. EXAM: RIGHT FOREARM - 2 VIEW COMPARISON:  None. FINDINGS: No fracture.  No bone lesion. Wrist and elbow joints are normally aligned. No significant arthropathic change. Soft tissues are unremarkable.  IMPRESSION: Negative. Electronically Signed   By: Lajean Manes M.D.   On: 05/16/2016 09:03    EKG: Orders placed or performed in visit on 05/12/16  . EKG 12-Lead     Hospital Course: Tamara Lam is a 65 y.o. who was admitted to [facility]. They were brought to the operating room on 05/26/2016 and underwent Procedure(s): RIGHT TOTAL KNEE ARTHROPLASTY.  Patient tolerated the procedure well and was later transferred to the recovery room and then to the orthopaedic floor for postoperative care.  They were given PO and IV analgesics for pain control following their surgery.  They were given 24 hours of postoperative antibiotics of  Anti-infectives    Start     Dose/Rate Route Frequency Ordered Stop   05/26/16 1600  ceFAZolin (ANCEF) IVPB 2g/100 mL premix     2 g 200 mL/hr over 30 Minutes Intravenous Every 6 hours 05/26/16 1218 05/26/16 2234   05/26/16 0705  ceFAZolin (ANCEF) IVPB 2g/100 mL premix     2 g 200 mL/hr over 30 Minutes Intravenous On call to O.R. 05/26/16 5638 05/26/16 0928     and started on DVT prophylaxis in the form of Xarelto.   PT and OT were ordered for total joint protocol.  Discharge planning consulted to help with postop disposition and equipment needs.  Patient had a good night on the evening of surgery except fo low pressures.  Treated with fluids.  They started to get up OOB with therapy on day one. Hemovac drain was pulled without difficulty.  Continued to work with therapy into day two.  Dressing was changed on day two and the incision was healing well.  Patient was seen in rounds and was ready to go home on POD 2.  Diet - Diabetic diet Follow up - in 2 weeks Activity - WBAT Disposition - Home Condition Upon Discharge - Good D/C Meds -  See DC Summary DVT Prophylaxis - Xarelto  Discharge Instructions    Call MD / Call 911    Complete by:  As directed    If you experience chest pain or shortness of breath, CALL 911 and be transported to the hospital  emergency room.  If you develope a fever above 101 F, pus (white drainage) or increased drainage or redness at the wound, or calf pain, call your surgeon's office.   Change dressing    Complete by:  As directed    Change dressing daily with sterile 4 x 4 inch gauze dressing and apply TED hose. Do not submerge the incision under water.   Constipation Prevention    Complete by:  As directed    Drink plenty of fluids.  Prune juice may be helpful.  You may use a stool softener, such as Colace (over the counter) 100 mg twice a day.  Use MiraLax (over the counter) for constipation as needed.   Diet - low sodium heart healthy    Complete by:  As directed    Discharge instructions    Complete by:  As directed    Pick up stool softner and laxative for home use following surgery while on pain medications. Do not submerge incision under water. Please use good hand washing techniques while changing dressing each day. May shower starting three days after surgery. Please use a clean towel to pat the incision dry following showers. Continue to use ice for pain and swelling after surgery. Do not use any lotions or creams on the incision until instructed by your surgeon.   Postoperative Constipation Protocol  Constipation - defined medically as fewer than three stools per week and severe constipation as less than one stool per week.  One of the most common issues patients have following surgery is constipation.  Even if you have a regular bowel pattern at home, your normal regimen is likely to be disrupted due to multiple reasons following surgery.  Combination of anesthesia, postoperative narcotics, change in appetite and fluid intake all can affect your bowels.  In order to avoid complications following surgery, here are some recommendations in order to help you during your recovery period.  Colace (docusate) - Pick up an over-the-counter form of Colace or another stool softener and take twice a day as  long as you are requiring postoperative pain medications.  Take with a full glass of water daily.  If you experience loose stools or diarrhea, hold the colace until you stool forms back up.  If your symptoms do not get better within 1 week or if they get worse, check with your doctor.  Dulcolax (bisacodyl) - Pick up over-the-counter and take as directed by the product packaging as needed to assist with the movement of your bowels.  Take with a full glass of water.  Use this product as needed if not relieved by Colace only.   MiraLax (polyethylene glycol) - Pick up over-the-counter to have on hand.  MiraLax is a solution that will increase the amount of water in your bowels to assist with bowel movements.  Take as directed and can mix with a glass of water, juice, soda, coffee, or tea.  Take if you go more than two days without a movement. Do not use MiraLax more than once per day. Call your doctor if you are still constipated or irregular after using this medication for 7 days in a row.  If you continue to have problems with postoperative constipation, please  contact the office for further assistance and recommendations.  If you experience "the worst abdominal pain ever" or develop nausea or vomiting, please contact the office immediatly for further recommendations for treatment.   Take Xarelto for two and a half more weeks, then discontinue Xarelto. Once the patient has completed the Xarelto, they may resume the 81 mg Aspirin.   Do not put a pillow under the knee. Place it under the heel.    Complete by:  As directed    Do not sit on low chairs, stoools or toilet seats, as it may be difficult to get up from low surfaces    Complete by:  As directed    Driving restrictions    Complete by:  As directed    No driving until released by the physician.   Increase activity slowly as tolerated    Complete by:  As directed    Lifting restrictions    Complete by:  As directed    No lifting until  released by the physician.   Patient may shower    Complete by:  As directed    You may shower without a dressing once there is no drainage.  Do not wash over the wound.  If drainage remains, do not shower until drainage stops.   TED hose    Complete by:  As directed    Use stockings (TED hose) for 3 weeks on both leg(s).  You may remove them at night for sleeping.   Weight bearing as tolerated    Complete by:  As directed    Laterality:  right   Extremity:  Lower       Medication List    STOP taking these medications   alendronate 70 MG tablet Commonly known as:  FOSAMAX   aspirin EC 81 MG tablet   CALCIUM 1200 1200-1000 MG-UNIT Chew   CENTRUM SILVER PO   hydrocortisone 2.5 % rectal cream Commonly known as:  ANUSOL-HC   Probiotic Caps   vitamin C 500 MG tablet Commonly known as:  ASCORBIC ACID     TAKE these medications   acetaminophen 500 MG tablet Commonly known as:  TYLENOL Take 500 mg by mouth daily as needed for moderate pain or headache.   diazepam 5 MG tablet Commonly known as:  VALIUM TAKE 1 TABLET BY MOUTH DAILY AS NEEDED FOR ANXIETY.   linaclotide 290 MCG Caps capsule Commonly known as:  LINZESS Take 1 capsule (290 mcg total) by mouth daily before breakfast.   methocarbamol 500 MG tablet Commonly known as:  ROBAXIN Take 1 tablet (500 mg total) by mouth every 6 (six) hours as needed for muscle spasms.   oxyCODONE 5 MG immediate release tablet Commonly known as:  Oxy IR/ROXICODONE Take 1-2 tablets (5-10 mg total) by mouth every 3 (three) hours as needed for moderate pain or severe pain.   rivaroxaban 10 MG Tabs tablet Commonly known as:  XARELTO Take 1 tablet (10 mg total) by mouth daily with breakfast. Take Xarelto for two and a half more weeks, then discontinue Xarelto. Once the patient has completed the Xarelto, they may resume the 81 mg Aspirin.   simvastatin 20 MG tablet Commonly known as:  ZOCOR TAKE TABLET BY MOUTH AT  BEDTIME FOR  CHOLESTEROL.   traMADol 50 MG tablet Commonly known as:  ULTRAM Take 1-2 tablets (50-100 mg total) by mouth every 6 (six) hours as needed (mild pain).   TRAVATAN 0.004 % ophthalmic solution Generic drug:  travoprost (benzalkonium)  Place 1 drop into both eyes at bedtime.      Follow-up Information    [facility] Follow up.   Specialty:  [place] Why:  home health physical therapy Contact information: [address] [place] [address] [phone]           Signed: Arlee Muslim, PA-C Orthopaedic Surgery 05/30/2016, 7:46 AM

## 2016-06-02 DIAGNOSIS — Z7901 Long term (current) use of anticoagulants: Secondary | ICD-10-CM | POA: Diagnosis not present

## 2016-06-02 DIAGNOSIS — Z96651 Presence of right artificial knee joint: Secondary | ICD-10-CM | POA: Diagnosis not present

## 2016-06-02 DIAGNOSIS — Z471 Aftercare following joint replacement surgery: Secondary | ICD-10-CM | POA: Diagnosis not present

## 2016-06-02 DIAGNOSIS — Z79891 Long term (current) use of opiate analgesic: Secondary | ICD-10-CM | POA: Diagnosis not present

## 2016-06-04 DIAGNOSIS — Z471 Aftercare following joint replacement surgery: Secondary | ICD-10-CM | POA: Diagnosis not present

## 2016-06-04 DIAGNOSIS — Z96651 Presence of right artificial knee joint: Secondary | ICD-10-CM | POA: Diagnosis not present

## 2016-06-04 DIAGNOSIS — Z79891 Long term (current) use of opiate analgesic: Secondary | ICD-10-CM | POA: Diagnosis not present

## 2016-06-04 DIAGNOSIS — Z7901 Long term (current) use of anticoagulants: Secondary | ICD-10-CM | POA: Diagnosis not present

## 2016-06-06 DIAGNOSIS — Z79891 Long term (current) use of opiate analgesic: Secondary | ICD-10-CM | POA: Diagnosis not present

## 2016-06-06 DIAGNOSIS — Z96651 Presence of right artificial knee joint: Secondary | ICD-10-CM | POA: Diagnosis not present

## 2016-06-06 DIAGNOSIS — Z7901 Long term (current) use of anticoagulants: Secondary | ICD-10-CM | POA: Diagnosis not present

## 2016-06-06 DIAGNOSIS — Z471 Aftercare following joint replacement surgery: Secondary | ICD-10-CM | POA: Diagnosis not present

## 2016-06-09 DIAGNOSIS — Z96651 Presence of right artificial knee joint: Secondary | ICD-10-CM | POA: Diagnosis not present

## 2016-06-09 DIAGNOSIS — Z7901 Long term (current) use of anticoagulants: Secondary | ICD-10-CM | POA: Diagnosis not present

## 2016-06-09 DIAGNOSIS — Z471 Aftercare following joint replacement surgery: Secondary | ICD-10-CM | POA: Diagnosis not present

## 2016-06-09 DIAGNOSIS — Z79891 Long term (current) use of opiate analgesic: Secondary | ICD-10-CM | POA: Diagnosis not present

## 2016-06-11 ENCOUNTER — Ambulatory Visit (HOSPITAL_COMMUNITY): Payer: Medicare Other | Attending: Orthopedic Surgery | Admitting: Physical Therapy

## 2016-06-11 ENCOUNTER — Encounter (HOSPITAL_COMMUNITY): Payer: Self-pay | Admitting: Physical Therapy

## 2016-06-11 DIAGNOSIS — M25661 Stiffness of right knee, not elsewhere classified: Secondary | ICD-10-CM | POA: Diagnosis not present

## 2016-06-11 DIAGNOSIS — R2689 Other abnormalities of gait and mobility: Secondary | ICD-10-CM | POA: Diagnosis not present

## 2016-06-11 DIAGNOSIS — R600 Localized edema: Secondary | ICD-10-CM

## 2016-06-11 DIAGNOSIS — M25561 Pain in right knee: Secondary | ICD-10-CM | POA: Diagnosis not present

## 2016-06-11 NOTE — Therapy (Signed)
[place], [place], [address] Phone: [phone]   Fax:  [phone]  Physical Therapy Evaluation  Patient Details  Name: Tamara Lam MRN: [id number] Date of Birth: 10/29/1950 Referring Provider: Gaynelle Arabian, MD  Encounter Date: 06/11/2016      PT End of Session - 06/11/16 1507    Visit Number 1   Number of Visits 17   Date for PT Re-Evaluation 07/09/16   Authorization Type U[employer] Medicare   Authorization Time Period 06/11/16 to 08/06/16   PT Start Time 1304   PT Stop Time 1345   PT Time Calculation (min) 41 min   Activity Tolerance No increased pain;Patient tolerated treatment well   Behavior During Therapy [facility] for tasks assessed/performed      Past Medical History:  Diagnosis Date  . Abnormal CXR (chest x-ray)    Evaluated with Pulmonary- Dr. Luan Pulling- "pt. was told probably due to thin body frame"-saw no issues-PFT test normal.  . Anxiety 1980's   . Arthritis    osteoarthritis -knees, hands  . Chronic constipation    tx. Linzess  . Chronic nausea   . Diabetes mellitus without complication ([place])    [id number] level checked borderline x1, then further evaluated -Diabetes ruled out.  . Glaucoma 2004   Dr. Venetia Maxon, laser to left eye Sept 2011, bilateral eye drops for this  . Helicobacter pylori gastritis 04/2004   last EGD   . Hematochezia   . Hx of colonoscopy 08/2005   Dr. Gala Romney / hemorrhoids   . Thyroid disease    thyroid goiter, nodules-no problems.  . Weight loss     Past Surgical History:  Procedure Laterality Date  . ABDOMINAL HYSTERECTOMY    . arthoscopic surgery on right knee  2007  . CHOLECYSTECTOMY  1990's  . COLONOSCOPY  2011   Dr. Gala Romney: anal canal/internal hemorrhoids, dimintuive rectosigmoid polyp (polypoid rectal mucosa), few sigmoid diverticula   . COLONOSCOPY N/A 10/30/2015   Dr. Gala Romney: diverticulosis, hemorrhoids, surveillance in 2022   . TOTAL KNEE ARTHROPLASTY Right 05/26/2016   Procedure: RIGHT TOTAL KNEE ARTHROPLASTY;  Surgeon: Gaynelle Arabian, MD;  Location: [facility];  Service: Orthopedics;  Laterality: Right;  Marland Kitchen VESICOVAGINAL FISTULA CLOSURE W/ TAH  1989    There were no vitals filed for this visit.       Subjective Assessment - 06/11/16 1311    Subjective Pt reports having arthroscopic surgery ~10 years ago. The past 2 years she reports increasing Rt knee pain. She underwent Rt TKR on 05/26/16. She states that she has been doing good in her therapy at home.    Patient is accompained by: Family member   Pertinent History anxiety, glaucoma, DM, Rt TKR   Limitations Walking;Standing   How long can you walk comfortably? ~10 minutes    Patient Stated Goals improve knee strength/ROM   Currently in Pain? Yes   Pain Score 2    Pain Orientation Right   Pain Descriptors / Indicators Aching   Pain Type Surgical pain   Pain Radiating Towards down her shin   Pain Onset 1 to 4 weeks ago   Pain Frequency Intermittent   Aggravating Factors  being up on her leg, bending/straightening   Pain Relieving Factors medication, ice, walking, etc.             [facility] PT Assessment - 06/11/16 0001      Assessment   Medical Diagnosis Rt TKR   Referring Provider Gaynelle Arabian, MD  Onset Date/Surgical Date 05/26/16   Next MD Visit 07/01/16   Prior Therapy HHPT for 2 weeks      Precautions   Precautions None     Balance Screen   Has the patient fallen in the past 6 months No   Has the patient had a decrease in activity level because of a fear of falling?  No   Is the patient reluctant to leave their home because of a fear of falling?  No     Home Ecologist residence   Living Arrangements Spouse/significant other   Additional Comments only 2 STE in her carport      Prior Function   Level of Independence Independent     Cognition   Overall Cognitive Status Within Functional Limits for tasks assessed     Observation/Other Assessments    Focus on Therapeutic Outcomes (FOTO)  68% limited      Sensation   Light Touch Appears Intact     ROM / Strength   AROM / PROM / Strength AROM;Strength     AROM   AROM Assessment Site Knee   Right/Left Knee Right;Left   Right Knee Extension 20  lacking    Right Knee Flexion 97   Left Knee Extension 0   Left Knee Flexion 140     Strength   Strength Assessment Site Hip;Knee   Right/Left Hip Right;Left   Right Hip Flexion 5/5   Right Hip Extension 4-/5   Right Hip ABduction 4-/5   Left Hip Extension 4/5   Left Hip ABduction 4/5   Right/Left Knee Right;Left   Right Knee Flexion 5/5   Right Knee Extension 5/5   Left Knee Flexion 5/5   Left Knee Extension 5/5     Transfers   Five time sit to stand comments  15.9 sec, 1 UE support, weight shift Lt.      Ambulation/Gait   Ambulation/Gait Yes   Assistive device Rolling walker   Pre-Gait Activities Ascend/descend steps with reciprocal pattern an B handrails. x2 RT   Gait Comments Decreased heel strike on Rt, decreased step length Lt.      High Level Balance   High Level Balance Comments TUG: 14.5 sec with RW                   [facility] Adult PT Treatment/Exercise - 06/11/16 0001      Exercises   Exercises Knee/Hip     Knee/Hip Exercises: Seated   Other Seated Knee/Hip Exercises Rt knee flexion stretch 2x15 sec hold   HEP demo                PT Education - 06/11/16 1505    Education provided Yes   Education Details eval findings/POC; importance of gaining knee extension/flexion ROM for carryover into functional tasks; HEP initiated    Person(s) Educated Patient;Child(ren)   Methods Explanation;Demonstration;Handout   Comprehension Verbalized understanding;Returned demonstration          PT Short Term Goals - 06/11/16 1518      PT SHORT TERM GOAL #1   Title Pt will demo consistency and independence with HEP to improve knee ROM and mobility.   Time 2   Period Weeks   Status New     PT SHORT  TERM GOAL #2   Title Pt will demo proper sit to stand technique with minimal weight shift to the Lt during her sessions without verbal cues from therapist, to decrease risk  of overuse of her LLE.    Time 4   Period Weeks   Status New     PT SHORT TERM GOAL #3   Title Pt will demo improved Rt knee ROM to lacking no more than 5 degrees knee extension to improve her mechanics with ambulation.    Time 4   Period Weeks   Status New           PT Long Term Goals - 06/11/16 1520      PT LONG TERM GOAL #1   Title Pt will demo improved BLE strength to 5/5 MMT, to increase safety with daily activity.    Time 8   Period Weeks   Status New     PT LONG TERM GOAL #2   Title Pt will demo improved Rt knee ROM to 0-115 degrees to improve her ability to transition in/out of the car.    Time 8   Period Weeks   Status New     PT LONG TERM GOAL #3   Title Pt will demo improved functional strength and power evident by her ability to complete 5x sit to stand in less than 12 sec without UE support.    Time 8   Period Weeks   Status New     PT LONG TERM GOAL #4   Title Pt will perform TUG in less than 10 sec without an AD, to indicate she is at a decreased risk of falling in the community.   Time 8   Period Weeks   Status New               Plan - 06/11/16 1508    Clinical Impression Statement Pt is a pleasant 65yo F referred to OPPT s/p Rt TKR on 05/26/16 followed by 2 weeks of HHPT. She presents today with post-surgical pain and swelling as well as limitations in knee ROM and functional mobility. She is currently using a RW for ambulation with noted decreased heel strike during her cadence. Also noting her favoring her Lt LE during sit to stand activities. She has been performing her HEP provided after her surgery without difficulty and this was updated today to include more exercises specifically targeting ROM. She demonstrated understanding of her HEP and is in agreement with proposed  PT POC and frequency. She would benefit from skilled PT to address her limitations above and facilitate her return to independence with ambulation and other daily activities.    Rehab Potential Good   PT Frequency 2x / week   PT Duration 8 weeks   PT Treatment/Interventions Cryotherapy;ADLs/Self Care Home Management;Moist Heat;Therapeutic exercise;Therapeutic activities;Functional mobility training;Stair training;Gait training;Balance training;Neuromuscular re-education;Patient/family education;Manual techniques;Dry needling;Passive range of motion;Scar mobilization   PT Next Visit Plan review sit to stand technique, knee PROM, scar massage as able; trial gait training with SPC   PT Home Exercise Plan seated knee flexion stretch hold, knee extension stetch x30 min, sit to stand with equal weight bearing   Recommended Other Services none    Consulted and Agree with Plan of Care Patient      Patient will benefit from skilled therapeutic intervention in order to improve the following deficits and impairments:  Abnormal gait, Decreased activity tolerance, Decreased balance, Decreased endurance, Decreased mobility, Decreased skin integrity, Decreased strength, Decreased range of motion, Increased edema, Impaired flexibility, Pain, Improper body mechanics, Hypomobility  Visit Diagnosis: Acute pain of right knee  Stiffness of right knee, not elsewhere classified  Other abnormalities of gait and  mobility  Localized edema      G-Codes - 2016/07/02 1527    Functional Assessment Tool Used FOTO: 68% limited    Functional Limitation Mobility: Walking and moving around   Mobility: Walking and Moving Around Current Status [phone]) At least 60 percent but less than 80 percent impaired, limited or restricted   Mobility: Walking and Moving Around Goal Status [phone]) At least 20 percent but less than 40 percent impaired, limited or restricted       Problem List Patient Active Problem List   Diagnosis  Date Noted  . Right forearm pain 05/15/2016  . Preoperative general physical examination 05/15/2016  . Dermatitis 12/24/2015  . Welcome to Medicare preventive visit 11/13/2015  . Diverticulosis of colon without hemorrhage   . Diabetes mellitus without complication ([place]) [id number]  . Annual physical exam 03/30/2015  . History of colonic polyps 02/05/2015  . OA (osteoarthritis) of knee 10/22/2014  . Glaucoma 08/20/2013  . Nontoxic multinodular goiter 08/01/2013  . GERD (gastroesophageal reflux disease) 04/23/2013  . Osteopenia 06/16/2012  . Abnormal TSH 09/22/2011  . Hyperlipidemia LDL goal <100 12/27/2009  . CONSTIPATION, CHRONIC 09/24/2009  . ANXIETY 08/16/2009   3:38 PM,07-02-16 Elly Modena PT, DPT [facility] [place] [address] [place], [place], [address] Phone: [phone]   Fax:  [phone]  Name: MIALYNN FUDALA MRN: [id number] Date of Birth: May 29, 1951

## 2016-06-17 ENCOUNTER — Ambulatory Visit (HOSPITAL_COMMUNITY): Payer: Medicare Other | Admitting: Physical Therapy

## 2016-06-17 DIAGNOSIS — R600 Localized edema: Secondary | ICD-10-CM

## 2016-06-17 DIAGNOSIS — M25561 Pain in right knee: Secondary | ICD-10-CM

## 2016-06-17 DIAGNOSIS — R2689 Other abnormalities of gait and mobility: Secondary | ICD-10-CM

## 2016-06-17 DIAGNOSIS — M25661 Stiffness of right knee, not elsewhere classified: Secondary | ICD-10-CM

## 2016-06-17 NOTE — Therapy (Signed)
[place], [place], [address] Phone: [phone]   Fax:  [phone]  Physical Therapy Treatment  Patient Details  Name: Tamara Lam MRN: [id number] Date of Birth: 05-Apr-1951 Referring Provider: Gaynelle Arabian, MD  Encounter Date: 06/17/2016      PT End of Session - 06/17/16 1602    Visit Number 2   Number of Visits 17   Date for PT Re-Evaluation 07/09/16   Authorization Type UHC Medicare   Authorization Time Period 06/11/16 to 08/06/16   PT Start Time [id number]   PT Stop Time 1600   PT Time Calculation (min) 43 min   Activity Tolerance No increased pain;Patient tolerated treatment well   Behavior During Therapy [facility] for tasks assessed/performed      Past Medical History:  Diagnosis Date  . Abnormal CXR (chest x-ray)    Evaluated with Pulmonary- Dr. Luan Pulling- "pt. was told probably due to thin body frame"-saw no issues-PFT test normal.  . Anxiety 1980's   . Arthritis    osteoarthritis -knees, hands  . Chronic constipation    tx. Linzess  . Chronic nausea   . Diabetes mellitus without complication ([place])    [id number] level checked borderline x1, then further evaluated -Diabetes ruled out.  . Glaucoma 2004   Dr. Venetia Maxon, laser to left eye Sept 2011, bilateral eye drops for this  . Helicobacter pylori gastritis 04/2004   last EGD   . Hematochezia   . Hx of colonoscopy 08/2005   Dr. Gala Romney / hemorrhoids   . Thyroid disease    thyroid goiter, nodules-no problems.  . Weight loss     Past Surgical History:  Procedure Laterality Date  . ABDOMINAL HYSTERECTOMY    . arthoscopic surgery on right knee  2007  . CHOLECYSTECTOMY  1990's  . COLONOSCOPY  2011   Dr. Gala Romney: anal canal/internal hemorrhoids, dimintuive rectosigmoid polyp (polypoid rectal mucosa), few sigmoid diverticula   . COLONOSCOPY N/A 10/30/2015   Dr. Gala Romney: diverticulosis, hemorrhoids, surveillance in 2022   . TOTAL KNEE ARTHROPLASTY Right 05/26/2016   Procedure: RIGHT TOTAL KNEE ARTHROPLASTY;  Surgeon: Gaynelle Arabian, MD;  Location: [facility];  Service: Orthopedics;  Laterality: Right;  Marland Kitchen VESICOVAGINAL FISTULA CLOSURE W/ TAH  1989    There were no vitals filed for this visit.      Subjective Assessment - 06/17/16 1520    Subjective Pt reports that things are going well. She has been doing her HEP without any issues. No pain currently.   Patient is accompained by: Family member   Pertinent History anxiety, glaucoma, DM, Rt TKR   Limitations Walking;Standing   How long can you walk comfortably? ~10 minutes    Patient Stated Goals improve knee strength/ROM   Currently in Pain? No/denies   Pain Onset 1 to 4 weeks ago            [facility] PT Assessment - 06/17/16 0001      AROM   Right Knee Extension 10   Right Knee Flexion 95                     [facility] Adult PT Treatment/Exercise - 06/17/16 0001      Ambulation/Gait   Ambulation/Gait Yes   Ambulation Distance (Feet) 225 Feet   Assistive device Straight cane   Gait Comments Ambulating 226 ft with SPC in LUE, encouraging reciprocal arm swing and heel contact. Pt able to perform with decreased cues by the end of  the session.      Knee/Hip Exercises: Seated   Sit to Sand 10 reps;without UE support     Knee/Hip Exercises: Supine   Quad Sets Right;1 set;15 reps   Heel Slides AAROM;Right;1 set;10 reps   Heel Slides Limitations 10 sec hold    Other Supine Knee/Hip Exercises Rt PF with green TB 2x15     Manual Therapy   Manual Therapy Joint mobilization;Edema management;Passive ROM;Soft tissue mobilization   Manual therapy comments separate rest of session   Edema Management retrograde massage with RLE elevated   Joint Mobilization Prone Rt knee: lateral rotational mobs 5x20 sec, grade III    Soft tissue mobilization Scar massage along intact areas of incision.   Passive ROM Rt knee extension 10x10 sec hold                 PT Education - 06/17/16 1601     Education provided Yes   Education Details discussed benefits of walking and importance of heel/toe pattern; encouraged continued HEP adherence and walking with SPC at home only to further improve mobility   Person(s) Educated Patient   Methods Explanation   Comprehension Verbalized understanding;Returned demonstration          PT Short Term Goals - 06/11/16 1518      PT SHORT TERM GOAL #1   Title Pt will demo consistency and independence with HEP to improve knee ROM and mobility.   Time 2   Period Weeks   Status New     PT SHORT TERM GOAL #2   Title Pt will demo proper sit to stand technique with minimal weight shift to the Lt during her sessions without verbal cues from therapist, to decrease risk of overuse of her LLE.    Time 4   Period Weeks   Status New     PT SHORT TERM GOAL #3   Title Pt will demo improved Rt knee ROM to lacking no more than 5 degrees knee extension to improve her mechanics with ambulation.    Time 4   Period Weeks   Status New           PT Long Term Goals - 06/11/16 1520      PT LONG TERM GOAL #1   Title Pt will demo improved BLE strength to 5/5 MMT, to increase safety with daily activity.    Time 8   Period Weeks   Status New     PT LONG TERM GOAL #2   Title Pt will demo improved Rt knee ROM to 0-115 degrees to improve her ability to transition in/out of the car.    Time 8   Period Weeks   Status New     PT LONG TERM GOAL #3   Title Pt will demo improved functional strength and power evident by her ability to complete 5x sit to stand in less than 12 sec without UE support.    Time 8   Period Weeks   Status New     PT LONG TERM GOAL #4   Title Pt will perform TUG in less than 10 sec without an AD, to indicate she is at a decreased risk of falling in the community.   Time 8   Period Weeks   Status New               Plan - 06/17/16 1609    Clinical Impression Statement Today's session focused initially with manual  techniques to improve knee ROM  and scar mobility. Followed manual techniques with therex to further increase knee flexion and introduced gait training with SPC. Pt demonstrated proper technique with SPC after verbal cues were provided. Encouraged her to continue working on this at home in a controlled environment. Pt reporting no increase in pain following the session.   Rehab Potential Good   PT Frequency 2x / week   PT Duration 8 weeks   PT Treatment/Interventions Cryotherapy;ADLs/Self Care Home Management;Moist Heat;Therapeutic exercise;Therapeutic activities;Functional mobility training;Stair training;Gait training;Balance training;Neuromuscular re-education;Patient/family education;Manual techniques;Dry needling;Passive range of motion;Scar mobilization   PT Next Visit Plan knee PROM extension>flexion, scar massage as able; continue with [facility] training    PT Home Exercise Plan seated knee flexion stretch hold, knee extension stetch x30 min, sit to stand with equal weight bearing   Consulted and Agree with Plan of Care Patient      Patient will benefit from skilled therapeutic intervention in order to improve the following deficits and impairments:  Abnormal gait, Decreased activity tolerance, Decreased balance, Decreased endurance, Decreased mobility, Decreased skin integrity, Decreased strength, Decreased range of motion, Increased edema, Impaired flexibility, Pain, Improper body mechanics, Hypomobility  Visit Diagnosis: Acute pain of right knee  Stiffness of right knee, not elsewhere classified  Other abnormalities of gait and mobility  Localized edema     Problem List Patient Active Problem List   Diagnosis Date Noted  . Right forearm pain 05/15/2016  . Preoperative general physical examination 05/15/2016  . Dermatitis 12/24/2015  . Welcome to Medicare preventive visit 11/13/2015  . Diverticulosis of colon without hemorrhage   . Diabetes mellitus without complication ([place])  [id number]  . Annual physical exam 03/30/2015  . History of colonic polyps 02/05/2015  . OA (osteoarthritis) of knee 10/22/2014  . Glaucoma 08/20/2013  . Nontoxic multinodular goiter 08/01/2013  . GERD (gastroesophageal reflux disease) 04/23/2013  . Osteopenia 06/16/2012  . Abnormal TSH 09/22/2011  . Hyperlipidemia LDL goal <100 12/27/2009  . CONSTIPATION, CHRONIC 09/24/2009  . ANXIETY 08/16/2009    4:12 PM,06/17/16 Elly Modena PT, DPT [facility] [place] [address] [place], [place], [address] Phone: [phone]   Fax:  [phone]  Name: Tamara Lam MRN: [id number] Date of Birth: 1951/02/15

## 2016-06-19 ENCOUNTER — Ambulatory Visit (HOSPITAL_COMMUNITY): Payer: Medicare Other | Admitting: Physical Therapy

## 2016-06-19 DIAGNOSIS — M25561 Pain in right knee: Secondary | ICD-10-CM | POA: Diagnosis not present

## 2016-06-19 DIAGNOSIS — M25661 Stiffness of right knee, not elsewhere classified: Secondary | ICD-10-CM

## 2016-06-19 DIAGNOSIS — R2689 Other abnormalities of gait and mobility: Secondary | ICD-10-CM | POA: Diagnosis not present

## 2016-06-19 DIAGNOSIS — R600 Localized edema: Secondary | ICD-10-CM | POA: Diagnosis not present

## 2016-06-19 NOTE — Therapy (Signed)
[place], [place], [address] Phone: [phone]   Fax:  [phone]  Physical Therapy Treatment  Patient Details  Name: Tamara Lam MRN: [id number] Date of Birth: May 14, 1951 Referring Provider: Gaynelle Arabian, MD  Encounter Date: 06/19/2016      PT End of Session - 06/19/16 1622    Visit Number 3   Number of Visits 17   Date for PT Re-Evaluation 07/09/16   Authorization Type [employer] Medicare   Authorization Time Period 06/11/16 to 08/06/16   PT Start Time 1603   PT Stop Time 1642   PT Time Calculation (min) 39 min   Activity Tolerance No increased pain;Patient tolerated treatment well   Behavior During Therapy [facility] for tasks assessed/performed      Past Medical History:  Diagnosis Date  . Abnormal CXR (chest x-ray)    Evaluated with Pulmonary- Dr. Luan Pulling- "pt. was told probably due to thin body frame"-saw no issues-PFT test normal.  . Anxiety 1980's   . Arthritis    osteoarthritis -knees, hands  . Chronic constipation    tx. Linzess  . Chronic nausea   . Diabetes mellitus without complication ([place])    123XX123 level checked borderline x1, then further evaluated -Diabetes ruled out.  . Glaucoma 2004   Dr. Venetia Maxon, laser to left eye Sept 2011, bilateral eye drops for this  . Helicobacter pylori gastritis 04/2004   last EGD   . Hematochezia   . Hx of colonoscopy 08/2005   Dr. Gala Romney / hemorrhoids   . Thyroid disease    thyroid goiter, nodules-no problems.  . Weight loss     Past Surgical History:  Procedure Laterality Date  . ABDOMINAL HYSTERECTOMY    . arthoscopic surgery on right knee  2007  . CHOLECYSTECTOMY  1990's  . COLONOSCOPY  2011   Dr. Gala Romney: anal canal/internal hemorrhoids, dimintuive rectosigmoid polyp (polypoid rectal mucosa), few sigmoid diverticula   . COLONOSCOPY N/A 10/30/2015   Dr. Gala Romney: diverticulosis, hemorrhoids, surveillance in 2022   . TOTAL KNEE ARTHROPLASTY Right 05/26/2016   Procedure: RIGHT TOTAL KNEE ARTHROPLASTY;  Surgeon: Gaynelle Arabian, MD;  Location: [facility];  Service: Orthopedics;  Laterality: Right;  Marland Kitchen VESICOVAGINAL FISTULA CLOSURE W/ TAH  1989    There were no vitals filed for this visit.      Subjective Assessment - 06/19/16 1607    Subjective Pt reports that she was stiff yesterday and wasn't sure if is was due to the rain, etc. She has no pain currently.    Patient is accompained by: Family member   Pertinent History anxiety, glaucoma, DM, Rt TKR   Limitations Walking;Standing   How long can you walk comfortably? ~10 minutes    Patient Stated Goals improve knee strength/ROM   Currently in Pain? No/denies   Pain Onset 1 to 4 weeks ago            [facility] PT Assessment - 06/19/16 0001      AROM   Right Knee Extension 15   Right Knee Flexion 105                     [facility] Adult PT Treatment/Exercise - 06/19/16 0001      Knee/Hip Exercises: Stretches   Passive Hamstring Stretch Right;2 reps;30 seconds     Knee/Hip Exercises: Standing   Heel Raises Both;1 set;20 reps   Other Standing Knee Exercises retrorocking in // bars x2 min  Knee/Hip Exercises: Seated   Other Seated Knee/Hip Exercises Rt knee flexion stretch 10x10 sec hold     Knee/Hip Exercises: Supine   Quad Sets Right;1 set;10 reps   Quad Sets Limitations 5 sec hold with 5# weight    Short Arc Quad Sets Right;1 set;20 reps   Short Arc Quad Sets Limitations during ice/elevation   Other Supine Knee/Hip Exercises knee extension stretch x2 min     Manual Therapy   Manual Therapy Joint mobilization;Soft tissue mobilization   Manual therapy comments separate rest of session   Joint Mobilization Grade III/IV medial femoral mobs in knee extension   Soft tissue mobilization Scar massage along intact areas of incision.                PT Education - 06/19/16 1742    Education provided Yes   Education Details encouraged continued work at home to address  knee extension ROM   Person(s) Educated Patient   Methods Explanation   Comprehension Verbalized understanding          PT Short Term Goals - 06/11/16 1518      PT SHORT TERM GOAL #1   Title Pt will demo consistency and independence with HEP to improve knee ROM and mobility.   Time 2   Period Weeks   Status New     PT SHORT TERM GOAL #2   Title Pt will demo proper sit to stand technique with minimal weight shift to the Lt during her sessions without verbal cues from therapist, to decrease risk of overuse of her LLE.    Time 4   Period Weeks   Status New     PT SHORT TERM GOAL #3   Title Pt will demo improved Rt knee ROM to lacking no more than 5 degrees knee extension to improve her mechanics with ambulation.    Time 4   Period Weeks   Status New           PT Long Term Goals - 06/11/16 1520      PT LONG TERM GOAL #1   Title Pt will demo improved BLE strength to 5/5 MMT, to increase safety with daily activity.    Time 8   Period Weeks   Status New     PT LONG TERM GOAL #2   Title Pt will demo improved Rt knee ROM to 0-115 degrees to improve her ability to transition in/out of the car.    Time 8   Period Weeks   Status New     PT LONG TERM GOAL #3   Title Pt will demo improved functional strength and power evident by her ability to complete 5x sit to stand in less than 12 sec without UE support.    Time 8   Period Weeks   Status New     PT LONG TERM GOAL #4   Title Pt will perform TUG in less than 10 sec without an AD, to indicate she is at a decreased risk of falling in the community.   Time 8   Period Weeks   Status New               Plan - 06/19/16 1622    Clinical Impression Statement Pt continues to make progress towards her goals with improving knee ROM, demonstrating 0-10-110 deg by the end of the visit. Continued with therex to improve quad activation and knee ROM without report of increased pain. Will continue with current POC.  Rehab  Potential Good   PT Frequency 2x / week   PT Duration 8 weeks   PT Treatment/Interventions Cryotherapy;ADLs/Self Care Home Management;Moist Heat;Therapeutic exercise;Therapeutic activities;Functional mobility training;Stair training;Gait training;Balance training;Neuromuscular re-education;Patient/family education;Manual techniques;Dry needling;Passive range of motion;Scar mobilization   PT Next Visit Plan knee PROM extension>flexion, scar massage as able; continue with [facility] training    PT Home Exercise Plan seated knee flexion stretch hold, knee extension stetch x30 min, sit to stand with equal weight bearing   Consulted and Agree with Plan of Care Patient      Patient will benefit from skilled therapeutic intervention in order to improve the following deficits and impairments:  Abnormal gait, Decreased activity tolerance, Decreased balance, Decreased endurance, Decreased mobility, Decreased skin integrity, Decreased strength, Decreased range of motion, Increased edema, Impaired flexibility, Pain, Improper body mechanics, Hypomobility  Visit Diagnosis: Acute pain of right knee  Stiffness of right knee, not elsewhere classified  Other abnormalities of gait and mobility  Localized edema     Problem List Patient Active Problem List   Diagnosis Date Noted  . Right forearm pain 05/15/2016  . Preoperative general physical examination 05/15/2016  . Dermatitis 12/24/2015  . Welcome to Medicare preventive visit 11/13/2015  . Diverticulosis of colon without hemorrhage   . Diabetes mellitus without complication ([place]) [id number]  . Annual physical exam 03/30/2015  . History of colonic polyps 02/05/2015  . OA (osteoarthritis) of knee 10/22/2014  . Glaucoma 08/20/2013  . Nontoxic multinodular goiter 08/01/2013  . GERD (gastroesophageal reflux disease) 04/23/2013  . Osteopenia 06/16/2012  . Abnormal TSH 09/22/2011  . Hyperlipidemia LDL goal <100 12/27/2009  . CONSTIPATION, CHRONIC  09/24/2009  . ANXIETY 08/16/2009   5:42 PM,06/19/16 Elly Modena PT, DPT [facility] [place] [address] [place], [place], [address] Phone: [phone]   Fax:  [phone]  Name: Tamara Lam MRN: [id number] Date of Birth: [birth year]

## 2016-06-24 ENCOUNTER — Ambulatory Visit (HOSPITAL_COMMUNITY): Payer: Medicare Other

## 2016-06-24 DIAGNOSIS — M25561 Pain in right knee: Secondary | ICD-10-CM

## 2016-06-24 DIAGNOSIS — M25661 Stiffness of right knee, not elsewhere classified: Secondary | ICD-10-CM | POA: Diagnosis not present

## 2016-06-24 DIAGNOSIS — R600 Localized edema: Secondary | ICD-10-CM

## 2016-06-24 DIAGNOSIS — R2689 Other abnormalities of gait and mobility: Secondary | ICD-10-CM | POA: Diagnosis not present

## 2016-06-24 NOTE — Therapy (Signed)
[facility] [address] [place], [place], [address] Phone: [phone]   Fax:  [phone]  Physical Therapy Treatment  Patient Details  Name: Tamara Lam MRN: [id number] Date of Birth: 1951/05/03 Referring Provider: Gaynelle Arabian, MD  Encounter Date: 06/24/2016      PT End of Session - 06/24/16 1628    Visit Number 4   Number of Visits 17   Date for PT Re-Evaluation 07/09/16   Authorization Type [employer] Medicare   Authorization Time Period 06/11/16 to 08/06/16   PT Start Time [id number]   PT Stop Time 1652   PT Time Calculation (min) 42 min   Activity Tolerance No increased pain;Patient tolerated treatment well   Behavior During Therapy [facility] for tasks assessed/performed      Past Medical History:  Diagnosis Date  . Abnormal CXR (chest x-ray)    Evaluated with Pulmonary- Dr. Luan Pulling- "pt. was told probably due to thin body frame"-saw no issues-PFT test normal.  . Anxiety 1980's   . Arthritis    osteoarthritis -knees, hands  . Chronic constipation    tx. Linzess  . Chronic nausea   . Diabetes mellitus without complication ([place])    [id number] level checked borderline x1, then further evaluated -Diabetes ruled out.  . Glaucoma 2004   Dr. Venetia Maxon, laser to left eye Sept 2011, bilateral eye drops for this  . Helicobacter pylori gastritis 04/2004   last EGD   . Hematochezia   . Hx of colonoscopy 08/2005   Dr. Gala Romney / hemorrhoids   . Thyroid disease    thyroid goiter, nodules-no problems.  . Weight loss     Past Surgical History:  Procedure Laterality Date  . ABDOMINAL HYSTERECTOMY    . arthoscopic surgery on right knee  2007  . CHOLECYSTECTOMY  1990's  . COLONOSCOPY  2011   Dr. Gala Romney: anal canal/internal hemorrhoids, dimintuive rectosigmoid polyp (polypoid rectal mucosa), few sigmoid diverticula   . COLONOSCOPY N/A 10/30/2015   Dr. Gala Romney: diverticulosis, hemorrhoids, surveillance in 2022   . TOTAL KNEE ARTHROPLASTY Right 05/26/2016   Procedure: RIGHT TOTAL KNEE ARTHROPLASTY;  Surgeon: Gaynelle Arabian, MD;  Location: [facility];  Service: Orthopedics;  Laterality: Right;  Marland Kitchen VESICOVAGINAL FISTULA CLOSURE W/ TAH  1989    There were no vitals filed for this visit.      Subjective Assessment - 06/24/16 1624    Subjective Pt stated knee is feeling good today, current pain scale 3/10, feels stiff today   Pertinent History anxiety, glaucoma, DM, Rt TKR   Patient Stated Goals improve knee strength/ROM   Currently in Pain? Yes   Pain Score 3    Pain Location Knee   Pain Orientation Right   Pain Descriptors / Indicators Tightness  stiffness   Pain Radiating Towards no radiating pain today   Pain Onset 1 to 4 weeks ago   Pain Frequency Intermittent   Aggravating Factors  being on her feet or sitting for long periods of time tightens up   Pain Relieving Factors medication, ice, walking, etc                         [facility] Adult PT Treatment/Exercise - 06/24/16 0001      Ambulation/Gait   Ambulation/Gait Yes   Ambulation/Gait Assistance 5: Supervision   Ambulation Distance (Feet) 200 Feet   Assistive device Straight cane   Gait Comments Cueing to improve heel strike for knee extensuion  Knee/Hip Exercises: Stretches   Active Hamstring Stretch 3 reps;30 seconds   Active Hamstring Stretch Limitations supine with rope   Knee: Self-Stretch Limitations knee drives on [id number] step [id number]"     Knee/Hip Exercises: Standing   Heel Raises Both;1 set;20 reps   Heel Raises Limitations on slope to improve form     Knee/Hip Exercises: Supine   Quad Sets Right;1 set;10 reps   Quad Sets Limitations Cueing for correct activation (relax gluteal mm)   Short Arc Quad Sets Right;1 set;20 reps   Heel Slides AAROM;Right;1 set;10 reps     Knee/Hip Exercises: Prone   Prone Knee Hang 3 minutes     Manual Therapy   Manual Therapy Edema management;Joint mobilization;Soft tissue mobilization   Manual therapy comments separate  rest of session   Edema Management retrograde massage with RLE elevated and ice application   Joint Mobilization patella mobs and tib/fib'   Soft tissue mobilization Scar massage along intact areas of incision.                PT Education - 06/24/16 1843    Education provided Yes   Education Details Educated on importance of improving knee extension, addition of prone knee hang for HEP to improve extension, educated pt and family member benefits of retro massage and ice for edema control    Person(s) Educated Patient;Spouse   Methods Explanation;Demonstration;Handout   Comprehension Verbalized understanding;Returned demonstration;Need further instruction          PT Short Term Goals - 06/11/16 1518      PT SHORT TERM GOAL #1   Title Pt will demo consistency and independence with HEP to improve knee ROM and mobility.   Time 2   Period Weeks   Status New     PT SHORT TERM GOAL #2   Title Pt will demo proper sit to stand technique with minimal weight shift to the Lt during her sessions without verbal cues from therapist, to decrease risk of overuse of her LLE.    Time 4   Period Weeks   Status New     PT SHORT TERM GOAL #3   Title Pt will demo improved Rt knee ROM to lacking no more than 5 degrees knee extension to improve her mechanics with ambulation.    Time 4   Period Weeks   Status New           PT Long Term Goals - 06/11/16 1520      PT LONG TERM GOAL #1   Title Pt will demo improved BLE strength to 5/5 MMT, to increase safety with daily activity.    Time 8   Period Weeks   Status New     PT LONG TERM GOAL #2   Title Pt will demo improved Rt knee ROM to 0-115 degrees to improve her ability to transition in/out of the car.    Time 8   Period Weeks   Status New     PT LONG TERM GOAL #3   Title Pt will demo improved functional strength and power evident by her ability to complete 5x sit to stand in less than 12 sec without UE support.    Time 8    Period Weeks   Status New     PT LONG TERM GOAL #4   Title Pt will perform TUG in less than 10 sec without an AD, to indicate she is at a decreased risk of falling in the community.  Time 8   Period Weeks   Status New               Plan - 06/24/16 1840    Clinical Impression Statement Continued session focus on improving knee ROM.  Added extension based and knee mobiltiy exercises to improve knee extension and reduce stiffness.  Tactile and verbal cueing to improve quad activaiton and reduce compensation with hip extension.  No reports of increased pain through session.  Improved AROM at EOS 10-112 degrees.   Rehab Potential Good   PT Frequency 2x / week   PT Duration 8 weeks   PT Treatment/Interventions Cryotherapy;ADLs/Self Care Home Management;Moist Heat;Therapeutic exercise;Therapeutic activities;Functional mobility training;Stair training;Gait training;Balance training;Neuromuscular re-education;Patient/family education;Manual techniques;Dry needling;Passive range of motion;Scar mobilization   PT Next Visit Plan knee PROM extension>flexion, scar massage as able; continue with [facility] training    PT Home Exercise Plan seated knee flexion stretch hold, knee extension stetch x30 min, sit to stand with equal weight bearing      Patient will benefit from skilled therapeutic intervention in order to improve the following deficits and impairments:  Abnormal gait, Decreased activity tolerance, Decreased balance, Decreased endurance, Decreased mobility, Decreased skin integrity, Decreased strength, Decreased range of motion, Increased edema, Impaired flexibility, Pain, Improper body mechanics, Hypomobility  Visit Diagnosis: Acute pain of right knee  Stiffness of right knee, not elsewhere classified  Other abnormalities of gait and mobility  Localized edema     Problem List Patient Active Problem List   Diagnosis Date Noted  . Right forearm pain 05/15/2016  . Preoperative  general physical examination 05/15/2016  . Dermatitis 12/24/2015  . Welcome to Medicare preventive visit 11/13/2015  . Diverticulosis of colon without hemorrhage   . Diabetes mellitus without complication ([place]) [id number]  . Annual physical exam 03/30/2015  . History of colonic polyps 02/05/2015  . OA (osteoarthritis) of knee 10/22/2014  . Glaucoma 08/20/2013  . Nontoxic multinodular goiter 08/01/2013  . GERD (gastroesophageal reflux disease) 04/23/2013  . Osteopenia 06/16/2012  . Abnormal TSH 09/22/2011  . Hyperlipidemia LDL goal <100 12/27/2009  . CONSTIPATION, CHRONIC 09/24/2009  . ANXIETY 08/16/2009   Ihor Austin, LPTA; [address]  Aldona Lento 06/24/2016, 6:45 PM  [place], [place], [address] Phone: [phone]   Fax:  [phone]  Name: Tamara Lam MRN: [id number] Date of Birth: [birth year]

## 2016-06-24 NOTE — Patient Instructions (Signed)
Knee Extension Mobilization: Hang (Prone)    With table supporting thighs, place. Hold 3 minutes and increase hold time as tolerable (slowly) Repeat 1 times per set. Do 1 sessions per day.  http://orth.exer.us/723   Copyright  VHI. All rights reserved.

## 2016-06-26 ENCOUNTER — Ambulatory Visit (HOSPITAL_COMMUNITY): Payer: Medicare Other | Admitting: Physical Therapy

## 2016-06-26 DIAGNOSIS — R2689 Other abnormalities of gait and mobility: Secondary | ICD-10-CM | POA: Diagnosis not present

## 2016-06-26 DIAGNOSIS — M25661 Stiffness of right knee, not elsewhere classified: Secondary | ICD-10-CM | POA: Diagnosis not present

## 2016-06-26 DIAGNOSIS — R600 Localized edema: Secondary | ICD-10-CM | POA: Diagnosis not present

## 2016-06-26 DIAGNOSIS — M25561 Pain in right knee: Secondary | ICD-10-CM | POA: Diagnosis not present

## 2016-06-26 NOTE — Therapy (Signed)
[place], [place], [address] Phone: [phone]   Fax:  [phone]  Physical Therapy Treatment  Patient Details  Name: Tamara Lam MRN: [id number] Date of Birth: [birth year] Referring Provider: Gaynelle Arabian, MD  Encounter Date: 06/26/2016      PT End of Session - 06/26/16 1629    Visit Number 5   Number of Visits 17   Date for PT Re-Evaluation 07/09/16   Authorization Type UHC Medicare   Authorization Time Period 06/11/16 to 08/06/16   PT Start Time 1603   PT Stop Time 1645   PT Time Calculation (min) 42 min   Activity Tolerance No increased pain;Patient tolerated treatment well   Behavior During Therapy [facility] for tasks assessed/performed      Past Medical History:  Diagnosis Date  . Abnormal CXR (chest x-ray)    Evaluated with Pulmonary- Dr. Luan Pulling- "pt. was told probably due to thin body frame"-saw no issues-PFT test normal.  . Anxiety 1980's   . Arthritis    osteoarthritis -knees, hands  . Chronic constipation    tx. Linzess  . Chronic nausea   . Diabetes mellitus without complication ([place])    123XX123 level checked borderline x1, then further evaluated -Diabetes ruled out.  . Glaucoma 2004   Dr. Venetia Maxon, laser to left eye Sept 2011, bilateral eye drops for this  . Helicobacter pylori gastritis 04/2004   last EGD   . Hematochezia   . Hx of colonoscopy 08/2005   Dr. Gala Romney / hemorrhoids   . Thyroid disease    thyroid goiter, nodules-no problems.  . Weight loss     Past Surgical History:  Procedure Laterality Date  . ABDOMINAL HYSTERECTOMY    . arthoscopic surgery on right knee  2007  . CHOLECYSTECTOMY  1990's  . COLONOSCOPY  2011   Dr. Gala Romney: anal canal/internal hemorrhoids, dimintuive rectosigmoid polyp (polypoid rectal mucosa), few sigmoid diverticula   . COLONOSCOPY N/A 10/30/2015   Dr. Gala Romney: diverticulosis, hemorrhoids, surveillance in 2022   . TOTAL KNEE ARTHROPLASTY Right 05/26/2016   Procedure: RIGHT TOTAL KNEE ARTHROPLASTY;  Surgeon: Gaynelle Arabian, MD;  Location: [facility];  Service: Orthopedics;  Laterality: Right;  Marland Kitchen VESICOVAGINAL FISTULA CLOSURE W/ TAH  1989    There were no vitals filed for this visit.      Subjective Assessment - 06/26/16 1603    Subjective Pt states things are going well. No pain or issues currently.    Pertinent History anxiety, glaucoma, DM, Rt TKR   Patient Stated Goals improve knee strength/ROM   Currently in Pain? No/denies   Pain Onset 1 to 4 weeks ago            [facility] PT Assessment - 06/26/16 0001      AROM   Right Knee Extension 15   lacking                      [facility] Adult PT Treatment/Exercise - 06/26/16 0001      Knee/Hip Exercises: Stretches   Other Knee/Hip Stretches knee extension stretch with 10# weight x4 min  knee extension ROM lacking 10 deg following      Knee/Hip Exercises: Seated   Other Seated Knee/Hip Exercises knee flexion stretch on Rt 10x10 sec hold    Sit to Sand 1 sets;10 reps     Knee/Hip Exercises: Supine   Heel Slides Right;1 set;15 reps   Heel Slides Limitations 10 sec hold: 115 deg  flexion      Manual Therapy   Manual Therapy Joint mobilization;Soft tissue mobilization   Manual therapy comments separate rest of session   Joint Mobilization Patella mob grade III in all directions RLE; Grade III/IV AP fibular mobs; Grade IV AP tibiofemoral lateral rotation mobs   Soft tissue mobilization STM posterior knee, scar massage along intact areas                PT Education - 06/26/16 1640    Education provided Yes   Education Details encouraged pt to contact PCP/surgeon regarding a secondary pain medication she can take with less strength compared to what she was given after the surgery; encouraged pt to switch back to seated knee extension stretch with weight to get optimal gains in ROM   Person(s) Educated Patient   Methods Explanation   Comprehension Verbalized understanding           PT Short Term Goals - 06/11/16 1518      PT SHORT TERM GOAL #1   Title Pt will demo consistency and independence with HEP to improve knee ROM and mobility.   Time 2   Period Weeks   Status New     PT SHORT TERM GOAL #2   Title Pt will demo proper sit to stand technique with minimal weight shift to the Lt during her sessions without verbal cues from therapist, to decrease risk of overuse of her LLE.    Time 4   Period Weeks   Status New     PT SHORT TERM GOAL #3   Title Pt will demo improved Rt knee ROM to lacking no more than 5 degrees knee extension to improve her mechanics with ambulation.    Time 4   Period Weeks   Status New           PT Long Term Goals - 06/11/16 1520      PT LONG TERM GOAL #1   Title Pt will demo improved BLE strength to 5/5 MMT, to increase safety with daily activity.    Time 8   Period Weeks   Status New     PT LONG TERM GOAL #2   Title Pt will demo improved Rt knee ROM to 0-115 degrees to improve her ability to transition in/out of the car.    Time 8   Period Weeks   Status New     PT LONG TERM GOAL #3   Title Pt will demo improved functional strength and power evident by her ability to complete 5x sit to stand in less than 12 sec without UE support.    Time 8   Period Weeks   Status New     PT LONG TERM GOAL #4   Title Pt will perform TUG in less than 10 sec without an AD, to indicate she is at a decreased risk of falling in the community.   Time 8   Period Weeks   Status New               Plan - 06/26/16 1630    Clinical Impression Statement Today's session began with manual therapy to address scar mobility and joint mobility. Pt able to perform ROM exercises without significant difficulty and demonstrated 5 degree improvement in both extension and flexion ROM by the end of the session. Encouraged continued HEP adherence and use of SPC at home for further improvements in mobility. Pt verbalized understanding.     Rehab Potential Good  PT Frequency 2x / week   PT Duration 8 weeks   PT Treatment/Interventions Cryotherapy;ADLs/Self Care Home Management;Moist Heat;Therapeutic exercise;Therapeutic activities;Functional mobility training;Stair training;Gait training;Balance training;Neuromuscular re-education;Patient/family education;Manual techniques;Dry needling;Passive range of motion;Scar mobilization   PT Next Visit Plan knee PROM extension>flexion, scar massage as able; continue with [facility] training    PT Home Exercise Plan seated knee flexion stretch hold, knee extension stetch x30 min with weight, sit to stand with equal weight bearing   Recommended Other Services none   Consulted and Agree with Plan of Care Patient      Patient will benefit from skilled therapeutic intervention in order to improve the following deficits and impairments:  Abnormal gait, Decreased activity tolerance, Decreased balance, Decreased endurance, Decreased mobility, Decreased skin integrity, Decreased strength, Decreased range of motion, Increased edema, Impaired flexibility, Pain, Improper body mechanics, Hypomobility  Visit Diagnosis: Acute pain of right knee  Stiffness of right knee, not elsewhere classified  Other abnormalities of gait and mobility  Localized edema     Problem List Patient Active Problem List   Diagnosis Date Noted  . Right forearm pain 05/15/2016  . Preoperative general physical examination 05/15/2016  . Dermatitis 12/24/2015  . Welcome to Medicare preventive visit 11/13/2015  . Diverticulosis of colon without hemorrhage   . Diabetes mellitus without complication ([place]) Q000111Q  . Annual physical exam 03/30/2015  . History of colonic polyps 02/05/2015  . OA (osteoarthritis) of knee 10/22/2014  . Glaucoma 08/20/2013  . Nontoxic multinodular goiter 08/01/2013  . GERD (gastroesophageal reflux disease) 04/23/2013  . Osteopenia 06/16/2012  . Abnormal TSH 09/22/2011  . Hyperlipidemia LDL  goal <100 12/27/2009  . CONSTIPATION, CHRONIC 09/24/2009  . ANXIETY 08/16/2009   4:48 PM,06/26/16 Elly Modena PT, DPT [facility] [place] [address] [place], [place], [address] Phone: [phone]   Fax:  [phone]  Name: Tamara Lam MRN: [id number] Date of Birth: Mar 22, 1951

## 2016-06-28 ENCOUNTER — Other Ambulatory Visit: Payer: Self-pay | Admitting: Family Medicine

## 2016-07-01 ENCOUNTER — Ambulatory Visit (HOSPITAL_COMMUNITY): Payer: Medicare Other | Attending: Orthopedic Surgery | Admitting: Physical Therapy

## 2016-07-01 DIAGNOSIS — R600 Localized edema: Secondary | ICD-10-CM | POA: Diagnosis not present

## 2016-07-01 DIAGNOSIS — R2689 Other abnormalities of gait and mobility: Secondary | ICD-10-CM

## 2016-07-01 DIAGNOSIS — M25661 Stiffness of right knee, not elsewhere classified: Secondary | ICD-10-CM | POA: Insufficient documentation

## 2016-07-01 DIAGNOSIS — M25561 Pain in right knee: Secondary | ICD-10-CM | POA: Insufficient documentation

## 2016-07-01 NOTE — Therapy (Signed)
[place], [place], [address] Phone: [phone]   Fax:  [phone]  Physical Therapy Treatment  Patient Details  Name: Tamara Lam MRN: [id number] Date of Birth: 12/16/1950 Referring Provider: Gaynelle Arabian, MD  Encounter Date: 07/01/2016      PT End of Session - 07/01/16 1728    Visit Number 6   Number of Visits 17   Date for PT Re-Evaluation 07/09/16   Authorization Type UHC Medicare   Authorization Time Period 06/11/16 to 08/06/16   PT Start Time [id number]   PT Stop Time [id number]   PT Time Calculation (min) 39 min   Activity Tolerance No increased pain;Patient tolerated treatment well   Behavior During Therapy [facility] for tasks assessed/performed      Past Medical History:  Diagnosis Date  . Abnormal CXR (chest x-ray)    Evaluated with Pulmonary- Dr. Luan Pulling- "pt. was told probably due to thin body frame"-saw no issues-PFT test normal.  . Anxiety 1980's   . Arthritis    osteoarthritis -knees, hands  . Chronic constipation    tx. Linzess  . Chronic nausea   . Diabetes mellitus without complication ([place])    123XX123 level checked borderline x1, then further evaluated -Diabetes ruled out.  . Glaucoma 2004   Dr. Venetia Maxon, laser to left eye Sept 2011, bilateral eye drops for this  . Helicobacter pylori gastritis 04/2004   last EGD   . Hematochezia   . Hx of colonoscopy 08/2005   Dr. Gala Romney / hemorrhoids   . Thyroid disease    thyroid goiter, nodules-no problems.  . Weight loss     Past Surgical History:  Procedure Laterality Date  . ABDOMINAL HYSTERECTOMY    . arthoscopic surgery on right knee  2007  . CHOLECYSTECTOMY  1990's  . COLONOSCOPY  2011   Dr. Gala Romney: anal canal/internal hemorrhoids, dimintuive rectosigmoid polyp (polypoid rectal mucosa), few sigmoid diverticula   . COLONOSCOPY N/A 10/30/2015   Dr. Gala Romney: diverticulosis, hemorrhoids, surveillance in 2022   . TOTAL KNEE ARTHROPLASTY Right 05/26/2016    Procedure: RIGHT TOTAL KNEE ARTHROPLASTY;  Surgeon: Gaynelle Arabian, MD;  [facility];  Service: Orthopedics;  Laterality: Right;  Marland Kitchen VESICOVAGINAL FISTULA CLOSURE W/ TAH  1989    There were no vitals filed for this visit.      Subjective Assessment - 07/01/16 1729    Subjective Pt reports things are getting better. She has no pain currently, just some stiffness. She has been using her [facility] and feels that is going well.    Pertinent History anxiety, glaucoma, DM, Rt TKR   Patient Stated Goals improve knee strength/ROM   Currently in Pain? No/denies   Pain Onset 1 to 4 weeks ago            [facility] PT Assessment - 07/01/16 0001      AROM   Right Knee Extension 10   Right Knee Flexion 112                     [facility] Adult PT Treatment/Exercise - 07/01/16 0001      Knee/Hip Exercises: Stretches   Knee: Self-Stretch to increase Flexion Right   Knee: Self-Stretch Limitations 10x15 sec hold      Knee/Hip Exercises: Standing   Heel Raises Both;1 set;20 reps   Heel Raises Limitations toe raises     Knee/Hip Exercises: Supine   Other Supine Knee/Hip Exercises knee extension stretch with pt overpressure 10x10  sec hold      Manual Therapy   Manual therapy comments separate rest of session   Joint Mobilization Rt proximal fibular mobs A/P 5x30 sec each; Medial/posterior mobs with knee in end range flexion   Soft tissue mobilization Scar mobilization; Rolling Rt ITB                PT Education - 07/01/16 1735    Education provided Yes   Education Details purposed of manual techniques and importance of continued HEP adherence to improve knee extension ROM   Person(s) Educated Patient   Methods Explanation   Comprehension Verbalized understanding          PT Short Term Goals - 06/11/16 1518      PT SHORT TERM GOAL #1   Title Pt will demo consistency and independence with HEP to improve knee ROM and mobility.   Time 2   Period Weeks   Status New      PT SHORT TERM GOAL #2   Title Pt will demo proper sit to stand technique with minimal weight shift to the Lt during her sessions without verbal cues from therapist, to decrease risk of overuse of her LLE.    Time 4   Period Weeks   Status New     PT SHORT TERM GOAL #3   Title Pt will demo improved Rt knee ROM to lacking no more than 5 degrees knee extension to improve her mechanics with ambulation.    Time 4   Period Weeks   Status New           PT Long Term Goals - 06/11/16 1520      PT LONG TERM GOAL #1   Title Pt will demo improved BLE strength to 5/5 MMT, to increase safety with daily activity.    Time 8   Period Weeks   Status New     PT LONG TERM GOAL #2   Title Pt will demo improved Rt knee ROM to 0-115 degrees to improve her ability to transition in/out of the car.    Time 8   Period Weeks   Status New     PT LONG TERM GOAL #3   Title Pt will demo improved functional strength and power evident by her ability to complete 5x sit to stand in less than 12 sec without UE support.    Time 8   Period Weeks   Status New     PT LONG TERM GOAL #4   Title Pt will perform TUG in less than 10 sec without an AD, to indicate she is at a decreased risk of falling in the community.   Time 8   Period Weeks   Status New               Plan - 07/01/16 1737    Clinical Impression Statement Pt continues to make progress towards her goals with improving mobility and strength. She is not ambulating with a SPC without difficulty. Session focused on manual techniques to address ITB and fibular mobility with pt reporting improvements in knee stiffness. Encouraged pt to perform her ROM activities at home with increased intensity and she verbalized understanding.    Rehab Potential Good   PT Frequency 2x / week   PT Duration 8 weeks   PT Treatment/Interventions Cryotherapy;ADLs/Self Care Home Management;Moist Heat;Therapeutic exercise;Therapeutic activities;Functional mobility  training;Stair training;Gait training;Balance training;Neuromuscular re-education;Patient/family education;Manual techniques;Dry needling;Passive range of motion;Scar mobilization   PT Next Visit Plan knee  PROM extension>flexion, fibular/ITB mobility; sit to stand technique   PT Home Exercise Plan seated knee flexion stretch hold, knee extension stetch x30 min with weight, sit to stand with equal weight bearing    Consulted and Agree with Plan of Care Patient      Patient will benefit from skilled therapeutic intervention in order to improve the following deficits and impairments:  Abnormal gait, Decreased activity tolerance, Decreased balance, Decreased endurance, Decreased mobility, Decreased skin integrity, Decreased strength, Decreased range of motion, Increased edema, Impaired flexibility, Pain, Improper body mechanics, Hypomobility  Visit Diagnosis: Acute pain of right knee  Stiffness of right knee, not elsewhere classified  Other abnormalities of gait and mobility  Localized edema     Problem List Patient Active Problem List   Diagnosis Date Noted  . Right forearm pain 05/15/2016  . Preoperative general physical examination 05/15/2016  . Dermatitis 12/24/2015  . Welcome to Medicare preventive visit 11/13/2015  . Diverticulosis of colon without hemorrhage   . Diabetes mellitus without complication ([place]) [id number]  . Annual physical exam 03/30/2015  . History of colonic polyps 02/05/2015  . OA (osteoarthritis) of knee 10/22/2014  . Glaucoma 08/20/2013  . Nontoxic multinodular goiter 08/01/2013  . GERD (gastroesophageal reflux disease) 04/23/2013  . Osteopenia 06/16/2012  . Abnormal TSH 09/22/2011  . Hyperlipidemia LDL goal <100 12/27/2009  . CONSTIPATION, CHRONIC 09/24/2009  . ANXIETY 08/16/2009   5:48 PM,07/01/16 Elly Modena PT, DPT [facility] [place], [place], [address] Phone: [phone]   Fax:  [phone]  Name: Tamara Lam MRN: [id number] Date of Birth: 01-Aug-1950

## 2016-07-02 ENCOUNTER — Other Ambulatory Visit: Payer: Self-pay | Admitting: Nurse Practitioner

## 2016-07-02 ENCOUNTER — Encounter (HOSPITAL_COMMUNITY): Payer: Medicare Other

## 2016-07-04 ENCOUNTER — Ambulatory Visit (HOSPITAL_COMMUNITY): Payer: Medicare Other

## 2016-07-04 DIAGNOSIS — M25661 Stiffness of right knee, not elsewhere classified: Secondary | ICD-10-CM | POA: Diagnosis not present

## 2016-07-04 DIAGNOSIS — R600 Localized edema: Secondary | ICD-10-CM

## 2016-07-04 DIAGNOSIS — M25561 Pain in right knee: Secondary | ICD-10-CM | POA: Diagnosis not present

## 2016-07-04 DIAGNOSIS — R2689 Other abnormalities of gait and mobility: Secondary | ICD-10-CM | POA: Diagnosis not present

## 2016-07-04 NOTE — Therapy (Signed)
[place], [place], [address] Phone: [phone]   Fax:  [phone]  Physical Therapy Treatment  Patient Details  Name: Tamara Lam MRN: [id number] Date of Birth: Jan 14, 1951 Referring Provider: Gaynelle Arabian, MD  Encounter Date: 07/04/2016      PT End of Session - 07/04/16 1628    Visit Number 7   Number of Visits 17   Date for PT Re-Evaluation 07/09/16   Authorization Type UHC Medicare   Authorization Time Period 06/11/16 to 08/06/16   PT Start Time 1615   PT Stop Time 1655   PT Time Calculation (min) 40 min   Activity Tolerance No increased pain;Patient tolerated treatment well   Behavior During Therapy [facility] for tasks assessed/performed      Past Medical History:  Diagnosis Date  . Abnormal CXR (chest x-ray)    Evaluated with Pulmonary- Dr. Luan Pulling- "pt. was told probably due to thin body frame"-saw no issues-PFT test normal.  . Anxiety 1980's   . Arthritis    osteoarthritis -knees, hands  . Chronic constipation    tx. Linzess  . Chronic nausea   . Diabetes mellitus without complication ([place])    123XX123 level checked borderline x1, then further evaluated -Diabetes ruled out.  . Glaucoma 2004   Dr. Venetia Maxon, laser to left eye Sept 2011, bilateral eye drops for this  . Helicobacter pylori gastritis 04/2004   last EGD   . Hematochezia   . Hx of colonoscopy 08/2005   Dr. Gala Romney / hemorrhoids   . Thyroid disease    thyroid goiter, nodules-no problems.  . Weight loss     Past Surgical History:  Procedure Laterality Date  . ABDOMINAL HYSTERECTOMY    . arthoscopic surgery on right knee  2007  . CHOLECYSTECTOMY  1990's  . COLONOSCOPY  2011   Dr. Gala Romney: anal canal/internal hemorrhoids, dimintuive rectosigmoid polyp (polypoid rectal mucosa), few sigmoid diverticula   . COLONOSCOPY N/A 10/30/2015   Dr. Gala Romney: diverticulosis, hemorrhoids, surveillance in 2022   . TOTAL KNEE ARTHROPLASTY Right 05/26/2016    Procedure: RIGHT TOTAL KNEE ARTHROPLASTY;  Surgeon: Gaynelle Arabian, MD;  [facility];  Service: Orthopedics;  Laterality: Right;  Marland Kitchen VESICOVAGINAL FISTULA CLOSURE W/ TAH  1989    There were no vitals filed for this visit.      Subjective Assessment - 07/04/16 1627    Subjective Pt stated no real pain today, does c/o stiffness Rt knee today.  Feels the weather relates to stiffness.     Pertinent History anxiety, glaucoma, DM, Rt TKR   Patient Stated Goals improve knee strength/ROM   Currently in Pain? No/denies   Pain Descriptors / Indicators Tightness  stiffness            [facility] PT Assessment - 07/04/16 0001      Assessment   Medical Diagnosis Rt TKR   Referring Provider Gaynelle Arabian, MD   Onset Date/Surgical Date 05/26/16   Next MD Visit 07/08/2016?   Prior Therapy [facility] for 2 weeks      Precautions   Precautions None     AROM   Right Knee Extension 10   Right Knee Flexion 111             [facility] Adult PT Treatment/Exercise - 07/04/16 0001      Knee/Hip Exercises: Stretches   Knee: Self-Stretch to increase Flexion Right   Knee: Self-Stretch Limitations 10x15 sec hold      Knee/Hip Exercises: Aerobic  Stationary Bike recumbent seat 11 at EOS to reduce stiffness and improve ROM     Knee/Hip Exercises: Standing   Terminal Knee Extension Limitations TKE infront of mat with tactile cueing for proper form/tech   Gait Training Cueing to improve heel strike with gait     Knee/Hip Exercises: Supine   Quad Sets Right;1 set;10 reps   Quad Sets Limitations Cueing for proper muscualtyre activation   Short Arc Quad Sets Right;1 set;20 reps   Heel Slides Right;1 set;15 reps   Heel Slides Limitations 10 sec hold: 115 deg flexion    Patellar Mobs complete   Knee Extension PROM;4 sets  30" holds with PROM per pt.'s tolerance                  PT Short Term Goals - 06/11/16 1518      PT SHORT TERM GOAL #1   Title Pt will demo consistency and independence  with HEP to improve knee ROM and mobility.   Time 2   Period Weeks   Status New     PT SHORT TERM GOAL #2   Title Pt will demo proper sit to stand technique with minimal weight shift to the Lt during her sessions without verbal cues from therapist, to decrease risk of overuse of her LLE.    Time 4   Period Weeks   Status New     PT SHORT TERM GOAL #3   Title Pt will demo improved Rt knee ROM to lacking no more than 5 degrees knee extension to improve her mechanics with ambulation.    Time 4   Period Weeks   Status New           PT Long Term Goals - 06/11/16 1520      PT LONG TERM GOAL #1   Title Pt will demo improved BLE strength to 5/5 MMT, to increase safety with daily activity.    Time 8   Period Weeks   Status New     PT LONG TERM GOAL #2   Title Pt will demo improved Rt knee ROM to 0-115 degrees to improve her ability to transition in/out of the car.    Time 8   Period Weeks   Status New     PT LONG TERM GOAL #3   Title Pt will demo improved functional strength and power evident by her ability to complete 5x sit to stand in less than 12 sec without UE support.    Time 8   Period Weeks   Status New     PT LONG TERM GOAL #4   Title Pt will perform TUG in less than 10 sec without an AD, to indicate she is at a decreased risk of falling in the community.   Time 8   Period Weeks   Status New               Plan - 07/04/16 1745    Clinical Impression Statement Session focus on improving knee mobility especially with extension.  Manual patella and fibular mobilty as well as PROM complete per pt tolerance to improve knee mobilty.  Pt required verbal and tactile cueing to improve quad contraction and reduce gluteal hip extension when completing quad sets.  Ended session on recumbent bicycle to improve mobility.  No reports of increased pain through session.  Proper gait mechanics noted with SPC without difficulty.     Rehab Potential Good   PT Frequency 2x /  week  PT Duration 8 weeks   PT Treatment/Interventions Cryotherapy;ADLs/Self Care Home Management;Moist Heat;Therapeutic exercise;Therapeutic activities;Functional mobility training;Stair training;Gait training;Balance training;Neuromuscular re-education;Patient/family education;Manual techniques;Dry needling;Passive range of motion;Scar mobilization   PT Next Visit Plan knee PROM extension>flexion, fibular/ITB mobility; sit to stand technique   PT Home Exercise Plan seated knee flexion stretch hold, knee extension stetch x30 min with weight, sit to stand with equal weight bearing       Patient will benefit from skilled therapeutic intervention in order to improve the following deficits and impairments:  Abnormal gait, Decreased activity tolerance, Decreased balance, Decreased endurance, Decreased mobility, Decreased skin integrity, Decreased strength, Decreased range of motion, Increased edema, Impaired flexibility, Pain, Improper body mechanics, Hypomobility  Visit Diagnosis: Acute pain of right knee  Stiffness of right knee, not elsewhere classified  Other abnormalities of gait and mobility  Localized edema     Problem List Patient Active Problem List   Diagnosis Date Noted  . Right forearm pain 05/15/2016  . Preoperative general physical examination 05/15/2016  . Dermatitis 12/24/2015  . Welcome to Medicare preventive visit 11/13/2015  . Diverticulosis of colon without hemorrhage   . Diabetes mellitus without complication ([place]) Q000111Q  . Annual physical exam 03/30/2015  . History of colonic polyps 02/05/2015  . OA (osteoarthritis) of knee 10/22/2014  . Glaucoma 08/20/2013  . Nontoxic multinodular goiter 08/01/2013  . GERD (gastroesophageal reflux disease) 04/23/2013  . Osteopenia 06/16/2012  . Abnormal TSH 09/22/2011  . Hyperlipidemia LDL goal <100 12/27/2009  . CONSTIPATION, CHRONIC 09/24/2009  . ANXIETY 08/16/2009   Ihor Austin, [place];  [place]  Aldona Lento 07/04/2016, 5:52 PM  [place] [address] [place], [place], [address] Phone: [phone]   Fax:  [phone]  Name: Tamara Lam MRN: [id number] Date of Birth: 1951/05/15

## 2016-07-07 ENCOUNTER — Ambulatory Visit (HOSPITAL_COMMUNITY): Payer: Medicare Other | Admitting: Physical Therapy

## 2016-07-07 DIAGNOSIS — R600 Localized edema: Secondary | ICD-10-CM | POA: Diagnosis not present

## 2016-07-07 DIAGNOSIS — M25561 Pain in right knee: Secondary | ICD-10-CM

## 2016-07-07 DIAGNOSIS — R2689 Other abnormalities of gait and mobility: Secondary | ICD-10-CM | POA: Diagnosis not present

## 2016-07-07 DIAGNOSIS — M25661 Stiffness of right knee, not elsewhere classified: Secondary | ICD-10-CM

## 2016-07-07 NOTE — Therapy (Signed)
[place], [place], [address] Phone: [phone]   Fax:  [phone]  Physical Therapy Treatment/Reassessment  Patient Details  Name: Tamara Lam MRN: [id number] Date of Birth: 1950/11/24 Referring Provider: Gaynelle Arabian, MD  Encounter Date: 07/07/2016      PT End of Session - 07/07/16 1533    Visit Number 8   Number of Visits 17   Date for PT Re-Evaluation 08/06/16   Authorization Type [employer] Medicare   Authorization Time Period 06/11/16 to 08/06/16   PT Start Time 1432   PT Stop Time 1515   PT Time Calculation (min) 43 min   Activity Tolerance No increased pain;Patient tolerated treatment well   Behavior During Therapy [facility] for tasks assessed/performed      Past Medical History:  Diagnosis Date  . Abnormal CXR (chest x-ray)    Evaluated with Pulmonary- Dr. Luan Pulling- "pt. was told probably due to thin body frame"-saw no issues-PFT test normal.  . Anxiety 1980's   . Arthritis    osteoarthritis -knees, hands  . Chronic constipation    tx. Linzess  . Chronic nausea   . Diabetes mellitus without complication ([place])    123XX123 level checked borderline x1, then further evaluated -Diabetes ruled out.  . Glaucoma 2004   Dr. Venetia Maxon, laser to left eye Sept 2011, bilateral eye drops for this  . Helicobacter pylori gastritis 04/2004   last EGD   . Hematochezia   . Hx of colonoscopy 08/2005   Dr. Gala Romney / hemorrhoids   . Thyroid disease    thyroid goiter, nodules-no problems.  . Weight loss     Past Surgical History:  Procedure Laterality Date  . ABDOMINAL HYSTERECTOMY    . arthoscopic surgery on right knee  2007  . CHOLECYSTECTOMY  1990's  . COLONOSCOPY  2011   Dr. Gala Romney: anal canal/internal hemorrhoids, dimintuive rectosigmoid polyp (polypoid rectal mucosa), few sigmoid diverticula   . COLONOSCOPY N/A 10/30/2015   Dr. Gala Romney: diverticulosis, hemorrhoids, surveillance in 2022   . TOTAL KNEE ARTHROPLASTY Right  05/26/2016   Procedure: RIGHT TOTAL KNEE ARTHROPLASTY;  Surgeon: Gaynelle Arabian, MD;  Location: [facility];  Service: Orthopedics;  Laterality: Right;  Marland Kitchen VESICOVAGINAL FISTULA CLOSURE W/ TAH  1989    There were no vitals filed for this visit.      Subjective Assessment - 07/07/16 1434    Subjective Pt reports that things are going well. She still has the stiffness and has had some pain today after waking up early. She continues to perform her HEP without difficulty.    Pertinent History anxiety, glaucoma, DM, Rt TKR   How long can you sit comfortably? unlimited    How long can you stand comfortably? unlimited    How long can you walk comfortably? unsure, she was able to go grocery shopping for about an hour without any issues.    Patient Stated Goals improve knee strength/ROM   Pain Score 3    Pain Location Knee   Pain Orientation Right   Pain Descriptors / Indicators Aching;Tightness   Pain Type Surgical pain   Pain Radiating Towards none    Pain Onset More than a month ago   Pain Frequency Intermittent   Aggravating Factors  walking her knee still feels stiff and "off"            [facility] PT Assessment - 07/07/16 0001      Assessment   Medical Diagnosis Rt TKR   Referring  Provider Gaynelle Arabian, MD   Onset Date/Surgical Date 05/26/16   Next MD Visit 07/08/2016   Prior Therapy [facility] for 2 weeks      Precautions   Precautions None     Balance Screen   Has the patient fallen in the past 6 months No   Has the patient had a decrease in activity level because of a fear of falling?  No   Is the patient reluctant to leave their home because of a fear of falling?  No     Observation/Other Assessments   Observations Pt now walking without her SPC at home and only using her SPC in the community      Sensation   Light Touch Appears Intact     AROM   Right Knee Extension 10   Right Knee Flexion 110     Strength   Right Hip Flexion 5/5   Right Hip Extension 4+/5   Right Hip  ABduction 4-/5   Left Hip Extension 4/5   Left Hip ABduction 5/5   Right Knee Flexion 5/5   Right Knee Extension 5/5   Left Knee Flexion 5/5   Left Knee Extension 5/5                             PT Education - 07/07/16 1531    Education provided Yes   Education Details Discussed areas of improvement and areas of impairment that pt needs to continue working on; importance of knee extension ROM to improve walking mechanics; updated HEP   Person(s) Educated Patient   Methods Explanation;Handout;Verbal cues   Comprehension Verbalized understanding;Returned demonstration          PT Short Term Goals - 06/11/16 1518      PT SHORT TERM GOAL #1   Title Pt will demo consistency and independence with HEP to improve knee ROM and mobility.   Time 2   Period Weeks   Status New     PT SHORT TERM GOAL #2   Title Pt will demo proper sit to stand technique with minimal weight shift to the Lt during her sessions without verbal cues from therapist, to decrease risk of overuse of her LLE.    Time 4   Period Weeks   Status New     PT SHORT TERM GOAL #3   Title Pt will demo improved Rt knee ROM to lacking no more than 5 degrees knee extension to improve her mechanics with ambulation.    Time 4   Period Weeks   Status New           PT Long Term Goals - 06/11/16 1520      PT LONG TERM GOAL #1   Title Pt will demo improved BLE strength to 5/5 MMT, to increase safety with daily activity.    Time 8   Period Weeks   Status New     PT LONG TERM GOAL #2   Title Pt will demo improved Rt knee ROM to 0-115 degrees to improve her ability to transition in/out of the car.    Time 8   Period Weeks   Status New     PT LONG TERM GOAL #3   Title Pt will demo improved functional strength and power evident by her ability to complete 5x sit to stand in less than 12 sec without UE support.    Time 8   Period Weeks   Status New  PT LONG TERM GOAL #4   Title Pt will  perform TUG in less than 10 sec without an AD, to indicate she is at a decreased risk of falling in the community.   Time 8   Period Weeks   Status New               Plan - Jul 25, 2016 1534    Clinical Impression Statement Pt was reassessed this visit having made improvements in all aspects of mobility, ROM and strength. She does continue to demonstrate ~10 degrees of limitation in knee extension ROM which is impacting her mechanics with ambulation and stair negotiation. ROM has been a large focus in previous sessions, however it appears to continue to be a limitation of hers. Otherwise, she is making progress towards her goals and is now ambulating without a SPC while at home. She would continue to benefit from skilled PT to address limitations in ROM, strength and mobility and facilitate independence with activity.    Rehab Potential Good   PT Frequency 2x / week   PT Duration 8 weeks   PT Treatment/Interventions Cryotherapy;ADLs/Self Care Home Management;Moist Heat;Therapeutic exercise;Therapeutic activities;Functional mobility training;Stair training;Gait training;Balance training;Neuromuscular re-education;Patient/family education;Manual techniques;Dry needling;Passive range of motion;Scar mobilization   PT Next Visit Plan knee extension>flexion, fibular/ITB mobility; begin functional strengthening   PT Home Exercise Plan seated knee flexion stretch hold, knee extension stetch x30 min with weight, sit to stand with equal weight bearing    Recommended Other Services none    Consulted and Agree with Plan of Care Patient      Patient will benefit from skilled therapeutic intervention in order to improve the following deficits and impairments:  Abnormal gait, Decreased activity tolerance, Decreased balance, Decreased endurance, Decreased mobility, Decreased skin integrity, Decreased strength, Decreased range of motion, Increased edema, Impaired flexibility, Pain, Improper body mechanics,  Hypomobility  Visit Diagnosis: Acute pain of right knee  Stiffness of right knee, not elsewhere classified  Other abnormalities of gait and mobility  Localized edema       G-Codes - 2016-07-25 1542    Functional Assessment Tool Used Clinical judgement based on assessment of ROM, strength and activity tolerance.    Functional Limitation Mobility: Walking and moving around   Mobility: Walking and Moving Around Current Status [phone]) At least 40 percent but less than 60 percent impaired, limited or restricted   Mobility: Walking and Moving Around Goal Status [phone]) At least 20 percent but less than 40 percent impaired, limited or restricted      Problem List Patient Active Problem List   Diagnosis Date Noted  . Right forearm pain 05/15/2016  . Preoperative general physical examination 05/15/2016  . Dermatitis 12/24/2015  . Welcome to Medicare preventive visit 11/13/2015  . Diverticulosis of colon without hemorrhage   . Diabetes mellitus without complication ([place]) Q000111Q  . Annual physical exam 03/30/2015  . History of colonic polyps 02/05/2015  . OA (osteoarthritis) of knee 10/22/2014  . Glaucoma 08/20/2013  . Nontoxic multinodular goiter 08/01/2013  . GERD (gastroesophageal reflux disease) 04/23/2013  . Osteopenia 06/16/2012  . Abnormal TSH 09/22/2011  . Hyperlipidemia LDL goal <100 12/27/2009  . CONSTIPATION, CHRONIC 09/24/2009  . ANXIETY 08/16/2009    3:46 PM,Jul 25, 2016 Elly Modena PT, DPT [facility] [place] [address] [place], [place], [address] Phone: [phone]   Fax:  [phone]  Name: Tamara Lam MRN: [id number] Date of Birth: 12-27-1950

## 2016-07-09 ENCOUNTER — Ambulatory Visit (HOSPITAL_COMMUNITY): Payer: Medicare Other

## 2016-07-09 DIAGNOSIS — R2689 Other abnormalities of gait and mobility: Secondary | ICD-10-CM | POA: Diagnosis not present

## 2016-07-09 DIAGNOSIS — R600 Localized edema: Secondary | ICD-10-CM | POA: Diagnosis not present

## 2016-07-09 DIAGNOSIS — M25661 Stiffness of right knee, not elsewhere classified: Secondary | ICD-10-CM | POA: Diagnosis not present

## 2016-07-09 DIAGNOSIS — M25561 Pain in right knee: Secondary | ICD-10-CM

## 2016-07-09 NOTE — Therapy (Signed)
[place], [place], [address] Phone: [phone]   Fax:  [phone]  Physical Therapy Treatment  Patient Details  Name: Tamara Lam MRN: [id number] Date of Birth: 06-21-1951 Referring Provider: Gaynelle Arabian, MD  Encounter Date: 07/09/2016      PT End of Session - 07/09/16 1455    Visit Number 9   Number of Visits 17   Date for PT Re-Evaluation 08/06/16   Authorization Type UHC Medicare   Authorization Time Period 06/11/16 to 08/06/16   PT Start Time 1440   PT Stop Time 1518   PT Time Calculation (min) 38 min      Past Medical History:  Diagnosis Date  . Abnormal CXR (chest x-ray)    Evaluated with Pulmonary- Dr. Luan Pulling- "pt. was told probably due to thin body frame"-saw no issues-PFT test normal.  . Anxiety 1980's   . Arthritis    osteoarthritis -knees, hands  . Chronic constipation    tx. Linzess  . Chronic nausea   . Diabetes mellitus without complication ([place])    123XX123 level checked borderline x1, then further evaluated -Diabetes ruled out.  . Glaucoma 2004   Dr. Venetia Maxon, laser to left eye Sept 2011, bilateral eye drops for this  . Helicobacter pylori gastritis 04/2004   last EGD   . Hematochezia   . Hx of colonoscopy 08/2005   Dr. Gala Romney / hemorrhoids   . Thyroid disease    thyroid goiter, nodules-no problems.  . Weight loss     Past Surgical History:  Procedure Laterality Date  . ABDOMINAL HYSTERECTOMY    . arthoscopic surgery on right knee  2007  . CHOLECYSTECTOMY  1990's  . COLONOSCOPY  2011   Dr. Gala Romney: anal canal/internal hemorrhoids, dimintuive rectosigmoid polyp (polypoid rectal mucosa), few sigmoid diverticula   . COLONOSCOPY N/A 10/30/2015   Dr. Gala Romney: diverticulosis, hemorrhoids, surveillance in 2022   . TOTAL KNEE ARTHROPLASTY Right 05/26/2016   Procedure: RIGHT TOTAL KNEE ARTHROPLASTY;  Surgeon: Gaynelle Arabian, MD;  Location: [facility];  Service: Orthopedics;  Laterality: Right;   Marland Kitchen VESICOVAGINAL FISTULA CLOSURE W/ TAH  1989    There were no vitals filed for this visit.      Subjective Assessment - 07/09/16 1449    Subjective Pt stated her knee is feeling good today, minimal dull pain maybe a 1/10 today.   Pertinent History anxiety, glaucoma, DM, Rt TKR   Patient Stated Goals improve knee strength/ROM   Currently in Pain? No/denies   Pain Score 1    Pain Location Knee   Pain Orientation Right   Pain Descriptors / Indicators Dull;Aching   Pain Type Surgical pain   Pain Radiating Towards none   Pain Onset More than a month ago   Pain Frequency Intermittent   Aggravating Factors  walking her knee still feels stiff and "off"   Pain Relieving Factors medication, ice, walking, etc                         [facility] Adult PT Treatment/Exercise - 07/09/16 0001      Knee/Hip Exercises: Standing   Heel Raises Both;1 set;20 reps   Heel Raises Limitations toe raises  on slope   Terminal Knee Extension Limitations TKE infront of mat with tactile cueing for proper form/tech   Lateral Step Up Right;10 reps;Hand Hold: 2;Step Height: 4"   Lateral Step Up Limitations cueing for proper technique   Functional Squat  10 reps     Knee/Hip Exercises: Seated   Sit to Sand 10 reps;without UE support  eccentric control     Knee/Hip Exercises: Supine   Short Arc Quad Sets Right;20 reps   Patellar Mobs complete     Knee/Hip Exercises: Prone   Prone Knee Hang 5 minutes   Prone Knee Hang Limitations Manual STM to hamstrings during prone knee hang   Straight Leg Raises Limitations TKE 10x5"     Manual Therapy   Manual Therapy Joint mobilization;Soft tissue mobilization   Manual therapy comments separate rest of session   Joint Mobilization fibular mobs; patella mobs all direction   Soft tissue mobilization hamstrings and PROM for extension during prone knee hang                  PT Short Term Goals - 06/11/16 1518      PT SHORT TERM GOAL #1    Title Pt will demo consistency and independence with HEP to improve knee ROM and mobility.   Time 2   Period Weeks   Status New     PT SHORT TERM GOAL #2   Title Pt will demo proper sit to stand technique with minimal weight shift to the Lt during her sessions without verbal cues from therapist, to decrease risk of overuse of her LLE.    Time 4   Period Weeks   Status New     PT SHORT TERM GOAL #3   Title Pt will demo improved Rt knee ROM to lacking no more than 5 degrees knee extension to improve her mechanics with ambulation.    Time 4   Period Weeks   Status New           PT Long Term Goals - 06/11/16 1520      PT LONG TERM GOAL #1   Title Pt will demo improved BLE strength to 5/5 MMT, to increase safety with daily activity.    Time 8   Period Weeks   Status New     PT LONG TERM GOAL #2   Title Pt will demo improved Rt knee ROM to 0-115 degrees to improve her ability to transition in/out of the car.    Time 8   Period Weeks   Status New     PT LONG TERM GOAL #3   Title Pt will demo improved functional strength and power evident by her ability to complete 5x sit to stand in less than 12 sec without UE support.    Time 8   Period Weeks   Status New     PT LONG TERM GOAL #4   Title Pt will perform TUG in less than 10 sec without an AD, to indicate she is at a decreased risk of falling in the community.   Time 8   Period Weeks   Status New               Plan - 07/09/16 1456    Clinical Impression Statement Pt continues to progress well with good gait mechanics and minimal pain for Rt knee.  Continued session focus on ROM initially with quad strengthening and manual techniques to improve patella and fibular mobilty as well as STM to hamstrings to improve knee extension.  Pt continues to be limited ~10 degrees extension.  Progressed to functional strengthening last half of tx with multimodal cueing to improve weight loading, form and techniquel.  EOS pt limited  by fatigue, no reports of  increased pain.     Rehab Potential Good   PT Frequency 2x / week   PT Duration 8 weeks   PT Treatment/Interventions Cryotherapy;ADLs/Self Care Home Management;Moist Heat;Therapeutic exercise;Therapeutic activities;Functional mobility training;Stair training;Gait training;Balance training;Neuromuscular re-education;Patient/family education;Manual techniques;Dry needling;Passive range of motion;Scar mobilization   PT Next Visit Plan knee extension>flexion, fibular/ITB mobility; begin functional strengthening   PT Home Exercise Plan seated knee flexion stretch hold, knee extension stetch x30 min with weight, sit to stand with equal weight bearing       Patient will benefit from skilled therapeutic intervention in order to improve the following deficits and impairments:  Abnormal gait, Decreased activity tolerance, Decreased balance, Decreased endurance, Decreased mobility, Decreased skin integrity, Decreased strength, Decreased range of motion, Increased edema, Impaired flexibility, Pain, Improper body mechanics, Hypomobility  Visit Diagnosis: Acute pain of right knee  Stiffness of right knee, not elsewhere classified  Other abnormalities of gait and mobility  Localized edema     Problem List Patient Active Problem List   Diagnosis Date Noted  . Right forearm pain 05/15/2016  . Preoperative general physical examination 05/15/2016  . Dermatitis 12/24/2015  . Welcome to Medicare preventive visit 11/13/2015  . Diverticulosis of colon without hemorrhage   . Diabetes mellitus without complication ([place]) Q000111Q  . Annual physical exam 03/30/2015  . History of colonic polyps 02/05/2015  . OA (osteoarthritis) of knee 10/22/2014  . Glaucoma 08/20/2013  . Nontoxic multinodular goiter 08/01/2013  . GERD (gastroesophageal reflux disease) 04/23/2013  . Osteopenia 06/16/2012  . Abnormal TSH 09/22/2011  . Hyperlipidemia LDL goal <100 12/27/2009  . CONSTIPATION,  CHRONIC 09/24/2009  . ANXIETY 08/16/2009   Ihor Austin, [place]; [place]  Aldona Lento 07/09/2016, 4:27 PM  [place] [address] [place], [place], [address] Phone: [phone]   Fax:  [phone]  Name: ZOEII REINIG MRN: [id number] Date of Birth: 03-07-1951

## 2016-07-10 DIAGNOSIS — Z96651 Presence of right artificial knee joint: Secondary | ICD-10-CM | POA: Diagnosis not present

## 2016-07-10 DIAGNOSIS — Z471 Aftercare following joint replacement surgery: Secondary | ICD-10-CM | POA: Diagnosis not present

## 2016-07-11 ENCOUNTER — Encounter (HOSPITAL_COMMUNITY): Payer: Medicare Other | Admitting: Physical Therapy

## 2016-07-14 ENCOUNTER — Ambulatory Visit (HOSPITAL_COMMUNITY): Payer: Medicare Other | Admitting: Physical Therapy

## 2016-07-14 DIAGNOSIS — M25661 Stiffness of right knee, not elsewhere classified: Secondary | ICD-10-CM | POA: Diagnosis not present

## 2016-07-14 DIAGNOSIS — R2689 Other abnormalities of gait and mobility: Secondary | ICD-10-CM

## 2016-07-14 DIAGNOSIS — M25561 Pain in right knee: Secondary | ICD-10-CM | POA: Diagnosis not present

## 2016-07-14 DIAGNOSIS — R600 Localized edema: Secondary | ICD-10-CM

## 2016-07-14 NOTE — Therapy (Signed)
[place], [place], [address] Phone: [phone]   Fax:  [phone]  Physical Therapy Treatment  Patient Details  Name: Tamara Lam MRN: [id number] Date of Birth: 1951-02-21 Referring Provider: Gaynelle Arabian, MD  Encounter Date: 07/14/2016      PT End of Session - 07/14/16 1553    Visit Number 10   Number of Visits 17   Date for PT Re-Evaluation 08/06/16   Authorization Type [employer] Medicare   Authorization Time Period 06/11/16 to 08/06/16   PT Start Time 1433   PT Stop Time 1515   PT Time Calculation (min) 42 min   Activity Tolerance No increased pain;Patient tolerated treatment well   Behavior During Therapy [facility] for tasks assessed/performed      Past Medical History:  Diagnosis Date  . Abnormal CXR (chest x-ray)    Evaluated with Pulmonary- Dr. Luan Pulling- "pt. was told probably due to thin body frame"-saw no issues-PFT test normal.  . Anxiety 1980's   . Arthritis    osteoarthritis -knees, hands  . Chronic constipation    tx. Linzess  . Chronic nausea   . Diabetes mellitus without complication ([place])    [id number] level checked borderline x1, then further evaluated -Diabetes ruled out.  . Glaucoma 2004   Dr. Venetia Maxon, laser to left eye Sept 2011, bilateral eye drops for this  . Helicobacter pylori gastritis 04/2004   last EGD   . Hematochezia   . Hx of colonoscopy 08/2005   Dr. Gala Romney / hemorrhoids   . Thyroid disease    thyroid goiter, nodules-no problems.  . Weight loss     Past Surgical History:  Procedure Laterality Date  . ABDOMINAL HYSTERECTOMY    . arthoscopic surgery on right knee  2007  . CHOLECYSTECTOMY  1990's  . COLONOSCOPY  2011   Dr. Gala Romney: anal canal/internal hemorrhoids, dimintuive rectosigmoid polyp (polypoid rectal mucosa), few sigmoid diverticula   . COLONOSCOPY N/A 10/30/2015   Dr. Gala Romney: diverticulosis, hemorrhoids, surveillance in 2022   . TOTAL KNEE ARTHROPLASTY Right 05/26/2016    Procedure: RIGHT TOTAL KNEE ARTHROPLASTY;  Surgeon: Gaynelle Arabian, MD;  Location: [facility];  Service: Orthopedics;  Laterality: Right;  Marland Kitchen VESICOVAGINAL FISTULA CLOSURE W/ TAH  1989    There were no vitals filed for this visit.      Subjective Assessment - 07/14/16 1436    Subjective Pt reports that she saw the surgeon this past Thursday and he was very pleased with her progress. She states her exercises are going very well.    Pertinent History anxiety, glaucoma, DM, Rt TKR   Patient Stated Goals improve knee strength/ROM   Currently in Pain? No/denies   Pain Onset More than a month ago                         [facility] Adult PT Treatment/Exercise - 07/14/16 0001      Knee/Hip Exercises: Standing   SLS 4x20 sec each     Knee/Hip Exercises: Seated   Long Arc Quad Right;2 sets;15 reps   Long Arc Quad Weight 3 lbs.   Hamstring Curl Right;2 sets;10 reps   Sit to Sand 2 sets;10 reps;without UE support  1 set with 4" elevation, 2nd set table at lowest point     Knee/Hip Exercises: Supine   Straight Leg Raises Right;2 sets;10 reps     Manual Therapy   Manual Therapy Joint mobilization   Manual therapy  comments separate rest of session   Joint Mobilization Rt Grade III/IV proximal fibular mobs; Grade III/IV closed chain femorial medial rotation mobs                PT Education - 07/14/16 1553    Education provided Yes   Education Details technique with therex and importance of continued adherence to ROM HEP despite progression in therapy sessions; updated HEP   Person(s) Educated Patient   Methods Explanation;Demonstration;Handout   Comprehension Returned demonstration;Verbalized understanding          PT Short Term Goals - 06/11/16 1518      PT SHORT TERM GOAL #1   Title Pt will demo consistency and independence with HEP to improve knee ROM and mobility.   Time 2   Period Weeks   Status New     PT SHORT TERM GOAL #2   Title Pt will demo proper sit  to stand technique with minimal weight shift to the Lt during her sessions without verbal cues from therapist, to decrease risk of overuse of her LLE.    Time 4   Period Weeks   Status New     PT SHORT TERM GOAL #3   Title Pt will demo improved Rt knee ROM to lacking no more than 5 degrees knee extension to improve her mechanics with ambulation.    Time 4   Period Weeks   Status New           PT Long Term Goals - 06/11/16 1520      PT LONG TERM GOAL #1   Title Pt will demo improved BLE strength to 5/5 MMT, to increase safety with daily activity.    Time 8   Period Weeks   Status New     PT LONG TERM GOAL #2   Title Pt will demo improved Rt knee ROM to 0-115 degrees to improve her ability to transition in/out of the car.    Time 8   Period Weeks   Status New     PT LONG TERM GOAL #3   Title Pt will demo improved functional strength and power evident by her ability to complete 5x sit to stand in less than 12 sec without UE support.    Time 8   Period Weeks   Status New     PT LONG TERM GOAL #4   Title Pt will perform TUG in less than 10 sec without an AD, to indicate she is at a decreased risk of falling in the community.   Time 8   Period Weeks   Status New               Plan - 07/14/16 1700    Clinical Impression Statement Continued this session initially with manual techniques to improve knee extension ROM for carry over into gait mechanics. Pt continues to demonstrate limitations in terminal knee extension despite therapist efforts to improve this. Progressed to more strengthening activity and balance as well, noting limitations in knee proprioception during single leg stance on the Rt compared to the Lt with increased shaking. Encouraged continued HEP adherence despite progression to more strengthening exercises, and pt verbalized understanding.   Rehab Potential Good   PT Frequency 2x / week   PT Duration 8 weeks   PT Treatment/Interventions  Cryotherapy;ADLs/Self Care Home Management;Moist Heat;Therapeutic exercise;Therapeutic activities;Functional mobility training;Stair training;Gait training;Balance training;Neuromuscular re-education;Patient/family education;Manual techniques;Dry needling;Passive range of motion;Scar mobilization   PT Next Visit Plan knee extension>flexion, hamstring/gastroc stretching;  functional strengthening and balance   PT Home Exercise Plan seated knee flexion stretch hold, knee extension stetch x30 min with weight, sit to stand with equal weight bearing    Consulted and Agree with Plan of Care Patient      Patient will benefit from skilled therapeutic intervention in order to improve the following deficits and impairments:  Abnormal gait, Decreased activity tolerance, Decreased balance, Decreased endurance, Decreased mobility, Decreased skin integrity, Decreased strength, Decreased range of motion, Increased edema, Impaired flexibility, Pain, Improper body mechanics, Hypomobility  Visit Diagnosis: Acute pain of right knee  Other abnormalities of gait and mobility  Localized edema  Stiffness of right knee, not elsewhere classified     Problem List Patient Active Problem List   Diagnosis Date Noted  . Right forearm pain 05/15/2016  . Preoperative general physical examination 05/15/2016  . Dermatitis 12/24/2015  . Welcome to Medicare preventive visit 11/13/2015  . Diverticulosis of colon without hemorrhage   . Diabetes mellitus without complication ([place]) [id number]  . Annual physical exam 03/30/2015  . History of colonic polyps 02/05/2015  . OA (osteoarthritis) of knee 10/22/2014  . Glaucoma 08/20/2013  . Nontoxic multinodular goiter 08/01/2013  . GERD (gastroesophageal reflux disease) 04/23/2013  . Osteopenia 06/16/2012  . Abnormal TSH 09/22/2011  . Hyperlipidemia LDL goal <100 12/27/2009  . CONSTIPATION, CHRONIC 09/24/2009  . ANXIETY 08/16/2009    Elly Modena 07/14/2016, 5:08  PM  [facility] [address] [place], [place], [address] Phone: [phone]   Fax:  [phone]  Name: Tamara Lam MRN: [id number] Date of Birth: 22-Jul-1950

## 2016-07-15 ENCOUNTER — Other Ambulatory Visit: Payer: Self-pay | Admitting: "Endocrinology

## 2016-07-15 ENCOUNTER — Telehealth (HOSPITAL_COMMUNITY): Payer: Self-pay

## 2016-07-15 DIAGNOSIS — E042 Nontoxic multinodular goiter: Secondary | ICD-10-CM

## 2016-07-15 NOTE — Telephone Encounter (Signed)
l/m ask pt to tell childs mother to call our office before coming to this apptment tomorrow due to possible weather 07/15/16 NF

## 2016-07-16 ENCOUNTER — Ambulatory Visit (HOSPITAL_COMMUNITY): Payer: Medicare Other

## 2016-07-18 ENCOUNTER — Encounter (HOSPITAL_COMMUNITY): Payer: Medicare Other | Admitting: Physical Therapy

## 2016-07-18 ENCOUNTER — Ambulatory Visit: Payer: BLUE CROSS/BLUE SHIELD | Admitting: "Endocrinology

## 2016-07-21 ENCOUNTER — Encounter (HOSPITAL_COMMUNITY): Payer: Medicare Other | Admitting: Physical Therapy

## 2016-07-21 ENCOUNTER — Telehealth (HOSPITAL_COMMUNITY): Payer: Self-pay | Admitting: Family Medicine

## 2016-07-21 NOTE — Telephone Encounter (Signed)
07/21/16 cx today and rescheduled for 1/26 and pt was ok with doing this

## 2016-07-22 ENCOUNTER — Ambulatory Visit: Payer: Medicare Other

## 2016-07-22 ENCOUNTER — Ambulatory Visit (INDEPENDENT_AMBULATORY_CARE_PROVIDER_SITE_OTHER): Payer: Medicare Other

## 2016-07-22 VITALS — BP 130/72 | HR 68 | Ht 67.0 in | Wt 117.1 lb

## 2016-07-22 DIAGNOSIS — E119 Type 2 diabetes mellitus without complications: Secondary | ICD-10-CM

## 2016-07-22 DIAGNOSIS — E785 Hyperlipidemia, unspecified: Secondary | ICD-10-CM

## 2016-07-22 DIAGNOSIS — M8580 Other specified disorders of bone density and structure, unspecified site: Secondary | ICD-10-CM

## 2016-07-22 DIAGNOSIS — E042 Nontoxic multinodular goiter: Secondary | ICD-10-CM | POA: Diagnosis not present

## 2016-07-22 DIAGNOSIS — Z0000 Encounter for general adult medical examination without abnormal findings: Secondary | ICD-10-CM | POA: Diagnosis not present

## 2016-07-22 NOTE — Progress Notes (Signed)
Subjective:   Tamara Lam is a 66 y.o. female who presents for an Initial Medicare Annual Wellness Visit.  Review of Systems  Cardiac Risk Factors include: advanced age (>98men, >73 women);diabetes mellitus;dyslipidemia;family history of premature cardiovascular disease;hypertension     Objective:    Today's Vitals   07/22/16 1327  BP: 130/72  Pulse: 68  SpO2: 98%  Weight: 117 lb 1.9 oz (53.1 kg)  Height: 5\' 7"  (1.702 m)   Body mass index is 18.34 kg/m.   Current Medications (verified) Outpatient Encounter Prescriptions as of 07/22/2016  Medication Sig  . acetaminophen (TYLENOL) 500 MG tablet Take 500 mg by mouth daily as needed for moderate pain or headache.  . alendronate (FOSAMAX) 70 MG tablet TAKE 1 TABLET ONCE EVERY  WEEK IN THE MORNING 30  MINUTES BEFORE EATING WITH  8 OUNCES OF WATER . SIT UP  FOR 30 MIN AFTER TAKING.  Marland Kitchen aspirin EC 81 MG tablet Take 81 mg by mouth daily.  . Calcium Carb-Cholecalciferol (CALCIUM 1000 + D) 1000-800 MG-UNIT TABS Take 1 tablet by mouth daily.  . diazepam (VALIUM) 5 MG tablet TAKE 1 TABLET BY MOUTH DAILY AS NEEDED FOR ANXIETY.  Marland Kitchen LINZESS 290 MCG CAPS capsule TAKE 1 CAPSULE BY MOUTH  DAILY BEFORE BREAKFAST  . methocarbamol (ROBAXIN) 500 MG tablet Take 1 tablet (500 mg total) by mouth every 6 (six) hours as needed for muscle spasms.  . Multiple Vitamins-Minerals (CENTRUM ADULTS) TABS Take 1 tablet by mouth daily.  . simvastatin (ZOCOR) 20 MG tablet TAKE TABLET BY MOUTH AT  BEDTIME FOR CHOLESTEROL.  Marland Kitchen traMADol (ULTRAM) 50 MG tablet Take 1-2 tablets (50-100 mg total) by mouth every 6 (six) hours as needed (mild pain).  Marland Kitchen travoprost, benzalkonium, (TRAVATAN) 0.004 % ophthalmic solution Place 1 drop into both eyes at bedtime.    . vitamin C (ASCORBIC ACID) 500 MG tablet Take 500 mg by mouth daily.  . [DISCONTINUED] oxyCODONE (OXY IR/ROXICODONE) 5 MG immediate release tablet Take 1-2 tablets (5-10 mg total) by mouth every 3 (three) hours as  needed for moderate pain or severe pain.  . [DISCONTINUED] rivaroxaban (XARELTO) 10 MG TABS tablet Take 1 tablet (10 mg total) by mouth daily with breakfast. Take Xarelto for two and a half more weeks, then discontinue Xarelto. Once the patient has completed the Xarelto, they may resume the 81 mg Aspirin.   No facility-administered encounter medications on file as of 07/22/2016.     Allergies (verified) Patient has no known allergies.   History: Past Medical History:  Diagnosis Date  . Abnormal CXR (chest x-ray)    Evaluated with Pulmonary- Dr. Luan Pulling- "pt. was told probably due to thin body frame"-saw no issues-PFT test normal.  . Anxiety 1980's   . Arthritis    osteoarthritis -knees, hands  . Chronic constipation    tx. Linzess  . Chronic nausea   . Diabetes mellitus without complication ([place])    [id number] level checked borderline x1, then further evaluated -Diabetes ruled out.  . Glaucoma 2004   Dr. Venetia Maxon, laser to left eye Sept 2011, bilateral eye drops for this  . Helicobacter pylori gastritis 04/2004   last EGD   . Hematochezia   . Hx of colonoscopy 08/2005   Dr. Gala Romney / hemorrhoids   . Thyroid disease    thyroid goiter, nodules-no problems.  . Weight loss    Past Surgical History:  Procedure Laterality Date  . ABDOMINAL HYSTERECTOMY    . arthoscopic surgery on right knee  2007  . CHOLECYSTECTOMY  1990's  . COLONOSCOPY  2011   Dr. Gala Romney: anal canal/internal hemorrhoids, dimintuive rectosigmoid polyp (polypoid rectal mucosa), few sigmoid diverticula   . COLONOSCOPY N/A 10/30/2015   Dr. Gala Romney: diverticulosis, hemorrhoids, surveillance in 2022   . TOTAL KNEE ARTHROPLASTY Right 05/26/2016   Procedure: RIGHT TOTAL KNEE ARTHROPLASTY;  Surgeon: Gaynelle Arabian, MD;  Location: [facility];  Service: Orthopedics;  Laterality: Right;  Marland Kitchen VESICOVAGINAL FISTULA CLOSURE W/ TAH  1989   Family History  Problem Relation Age of Onset  . Hypertension Mother   . Glaucoma Mother   .  Diverticulosis Mother   . Heart disease Mother   . Lung cancer Father   . Mental illness Sister   . Cancer Sister   . Leukemia Brother     some form of leukemia  . Colon polyps Son   . Colon cancer Neg Hx    Social History   Occupational History  . homemaker  Unemployed   Social History Main Topics  . Smoking status: Never Smoker  . Smokeless tobacco: Never Used     Comment: Never smoked  . Alcohol use No  . Drug use: No  . Sexual activity: Not Currently    Tobacco Counseling Counseling given: Not Answered   Activities of Daily Living In your present state of health, do you have any difficulty performing the following activities: 07/22/2016 05/26/2016  Hearing? N -  Vision? N -  Difficulty concentrating or making decisions? Y -  Walking or climbing stairs? N -  Dressing or bathing? N -  Doing errands, shopping? N N  Preparing Food and eating ? N -  Using the Toilet? N -  In the past six months, have you accidently leaked urine? N -  Do you have problems with loss of bowel control? N -  Managing your Medications? N -  Managing your Finances? N -  Housekeeping or managing your Housekeeping? N -  Some recent data might be hidden    Immunizations and Health Maintenance Immunization History  Administered Date(s) Administered  . Influenza Whole 03/13/2010, 03/12/2011  . Influenza,inj,Quad PF,36+ Mos 04/24/2013, 02/20/2014, 03/27/2015, 04/28/2016  . Pneumococcal Conjugate-13 06/20/2014  . Pneumococcal Polysaccharide-23 05/12/2016  . Td 08/16/2009  . Zoster 02/17/2011   There are no preventive care reminders to display for this patient.  Patient Care Team: Fayrene Helper, MD as PCP - General Daneil Dolin, MD as Consulting Physician (Gastroenterology) Cassandria Anger, MD as Consulting Physician (Endocrinology)  Indicate any recent Medical Services you may have received from other than [facility] providers in the past year (date may be approximate).       Assessment:   This is a routine wellness examination for [name].  Hearing/Vision screen No exam data present  Dietary issues and exercise activities discussed: Current Exercise Habits: Structured exercise class (silver sneakers), Type of exercise: walking;Other - see comments (various classes at the [facility]), Time (Minutes): 60, Frequency (Times/Week): 4, Weekly Exercise (Minutes/Week): 240, Intensity: Moderate, Exercise limited by: orthopedic condition(s) (recent knee surgery)  Goals    . Gain weight          Starting 07/23/2016 patient would like to gain 8 lbs to equal 125 lbs and be able to maintain that.      Depression Screen PHQ 2/9 Scores 07/22/2016 11/13/2015 07/17/2015 09/13/2014 05/01/2014 06/16/2012  PHQ - 2 Score 1 1 0 0 2 4  PHQ- 9 Score - - - - 5 17    Fall  Risk Fall Risk  07/22/2016 11/13/2015 07/17/2015 09/13/2014 05/01/2014  Falls in the past year? No No No No No    Cognitive Function:  Normal   6CIT Screen 07/22/2016  What Year? 0 points  What month? 0 points  What time? 0 points  Count back from 20 0 points  Months in reverse 0 points  Repeat phrase 0 points  Total Score 0    Screening Tests Health Maintenance  Topic Date Due  . OPHTHALMOLOGY EXAM  08/29/2016 (Originally 04/12/2015)  . HEMOGLOBIN A1C  11/02/2016  . URINE MICROALBUMIN  11/12/2016  . FOOT EXAM  11/14/2016  . PAP SMEAR  02/20/2017  . MAMMOGRAM  10/25/2017  . TETANUS/TDAP  08/17/2019  . COLONOSCOPY  10/29/2025  . INFLUENZA VACCINE  Completed  . DEXA SCAN  Completed  . ZOSTAVAX  Completed  . Hepatitis C Screening  Completed  . HIV Screening  Completed  . PNA vac Low Risk Adult  Completed      Plan:    I have personally reviewed and addressed the Medicare Annual Wellness questionnaire and have noted the following in the patient's chart:  A. Medical and social history B. Use of alcohol, tobacco or illicit drugs  C. Current medications and supplements D. Functional ability and status E.   Nutritional status F.  Physical activity G. Advance directives discussed today H. List of other physicians I.  Hospitalizations, surgeries, and ER visits in previous 12 months J.  [place] to include hearing, vision, cognitive, depression L. Referrals and appointments - DEXA ordered today   In addition, I have reviewed and discussed with patient certain preventive protocols, quality metrics, and best practice recommendations. A written personalized care plan for preventive services as well as general preventive health recommendations were provided to patient.  Signed,   Stormy Fabian, LPN Lead Nurse Health Advisor

## 2016-07-22 NOTE — Patient Instructions (Addendum)
Health maintenance: Due for repeat bone density, this was ordered today. Please call and schedule.   Abnormal screenings: None   Patient concerns: Chest congestion, recommend follow up with Dr. Moshe Cipro.   Nurse concerns: Weight. Recommend eating 3 balanced meals a day.   Next PCP appt: 10/09/2016 at 1:00 pm with Dr. Moshe Cipro   High-Protein and High-Calorie Diet Introduction Eating high-protein and high-calorie foods can help you to gain weight, heal after an injury, and recover after an illness or surgery. What is my plan? The specific amount of daily protein and calories you need depends on:  Your body weight.  The reason this diet is recommended for you. Generally, a high-protein, high-calorie diet involves:  Eating 250-500 extra calories each day.  Making sure that 10-35% of your daily calories come from protein. Talk to your health care provider about how much protein and how many calories you need each day. Follow the diet as directed by your health care provider. What do I need to know about this diet?  Ask your health care provider if you should take a nutritional supplement.  Try to eat six small meals each day instead of three large meals.  Eat a balanced diet, including one food that is high in protein at each meal.  Keep nutritious snacks handy, such as nuts, trail mixes, dried fruit, and yogurt.  If you have kidney disease or diabetes, eating too much protein may put extra stress on your kidneys. Talk to your health care provider if you have either of those conditions. What are some high-protein foods? Grains  Quinoa. Bulgur wheat. Vegetables  Soybeans. Peas. Meats and Other Protein Sources  Beef, pork, and poultry. Fish and seafood. Eggs. Tofu. Textured vegetable protein (TVP). Peanut butter. Nuts and seeds. Dried beans. Protein powders. Dairy  Whole milk. Whole-milk yogurt. Powdered milk. Cheese. [employer]. Eggnog. Beverages  High-protein supplement  drinks. Soy milk. Other  Protein bars. The items listed above may not be a complete list of recommended foods or beverages. Contact your dietitian for more options.  What are some high-calorie foods? Grains  Pasta. Quick breads. Muffins. Pancakes. Ready-to-eat cereal. Vegetables  Vegetables cooked in oil or butter. Fried potatoes. Fruits  Dried fruit. Fruit leather. Canned fruit in syrup. Fruit juice. Avocados. Meats and Other Protein Sources  Peanut butter. Nuts and seeds. Dairy  Heavy cream. Whipped cream. Cream cheese. Sour cream. Ice cream. Custard. Pudding. Beverages  Meal-replacement beverages. Nutrition shakes. Fruit juice. Sugar-sweetened soft drinks. Condiments  Salad dressing. Mayonnaise. Alfredo sauce. Fruit preserves or jelly. Honey. Syrup. Sweets/Desserts  Cake. Cookies. Pie. Pastries. Candy bars. Chocolate. Fats and Oils  Butter or margarine. Oil. Gravy. Other  Meal-replacement bars. The items listed above may not be a complete list of recommended foods or beverages. Contact your dietitian for more options.  What are some tips for including high-protein and high-calorie foods in my diet?  Add whole milk, half-and-half, or heavy cream to cereal, pudding, soup, or hot cocoa.  Add whole milk to instant breakfast drinks.  Add peanut butter to oatmeal or smoothies.  Add powdered milk to baked goods, smoothies, or milkshakes.  Add powdered milk, cream, or butter to mashed potatoes.  Add cheese to cooked vegetables.  Make whole-milk yogurt parfaits. Top them with granola, fruit, or nuts.  Add cottage cheese to your fruit.  Add avocados, cheese, or both to sandwiches or salads.  Add meat, poultry, or seafood to rice, pasta, casseroles, salads, and soups.  Use mayonnaise when making  egg salad, chicken salad, or tuna salad.  Use peanut butter as a topping for pretzels, celery, or crackers.  Add beans to casseroles, dips, and spreads.  Add pureed beans to  sauces and soups.  Replace calorie-free drinks with calorie-containing drinks, such as milk and fruit juice. This information is not intended to replace advice given to you by your health care provider. Make sure you discuss any questions you have with your health care provider. Document Released: 06/16/2005 Document Revised: 11/22/2015 Document Reviewed: 11/29/2013  2017 Elsevier   Fall Prevention in the Home Introduction Falls can cause injuries. They can happen to people of all ages. There are many things you can do to make your home safe and to help prevent falls. What can I do on the outside of my home?  Regularly fix the edges of walkways and driveways and fix any cracks.  Remove anything that might make you trip as you walk through a door, such as a raised step or threshold.  Trim any bushes or trees on the path to your home.  Use bright outdoor lighting.  Clear any walking paths of anything that might make someone trip, such as rocks or tools.  Regularly check to see if handrails are loose or broken. Make sure that both sides of any steps have handrails.  Any raised decks and porches should have guardrails on the edges.  Have any leaves, snow, or ice cleared regularly.  Use sand or salt on walking paths during winter.  Clean up any spills in your garage right away. This includes oil or grease spills. What can I do in the bathroom?  Use night lights.  Install grab bars by the toilet and in the tub and shower. Do not use towel bars as grab bars.  Use non-skid mats or decals in the tub or shower.  If you need to sit down in the shower, use a plastic, non-slip stool.  Keep the floor dry. Clean up any water that spills on the floor as soon as it happens.  Remove soap buildup in the tub or shower regularly.  Attach bath mats securely with double-sided non-slip rug tape.  Do not have throw rugs and other things on the floor that can make you trip. What can I do in  the bedroom?  Use night lights.  Make sure that you have a light by your bed that is easy to reach.  Do not use any sheets or blankets that are too big for your bed. They should not hang down onto the floor.  Have a firm chair that has side arms. You can use this for support while you get dressed.  Do not have throw rugs and other things on the floor that can make you trip. What can I do in the kitchen?  Clean up any spills right away.  Avoid walking on wet floors.  Keep items that you use a lot in easy-to-reach places.  If you need to reach something above you, use a strong step stool that has a grab bar.  Keep electrical cords out of the way.  Do not use floor polish or wax that makes floors slippery. If you must use wax, use non-skid floor wax.  Do not have throw rugs and other things on the floor that can make you trip. What can I do with my stairs?  Do not leave any items on the stairs.  Make sure that there are handrails on both sides of the stairs and  use them. Fix handrails that are broken or loose. Make sure that handrails are as long as the stairways.  Check any carpeting to make sure that it is firmly attached to the stairs. Fix any carpet that is loose or worn.  Avoid having throw rugs at the top or bottom of the stairs. If you do have throw rugs, attach them to the floor with carpet tape.  Make sure that you have a light switch at the top of the stairs and the bottom of the stairs. If you do not have them, ask someone to add them for you. What else can I do to help prevent falls?  Wear shoes that:  Do not have high heels.  Have rubber bottoms.  Are comfortable and fit you well.  Are closed at the toe. Do not wear sandals.  If you use a stepladder:  Make sure that it is fully opened. Do not climb a closed stepladder.  Make sure that both sides of the stepladder are locked into place.  Ask someone to hold it for you, if possible.  Clearly mark and  make sure that you can see:  Any grab bars or handrails.  First and last steps.  Where the edge of each step is.  Use tools that help you move around (mobility aids) if they are needed. These include:  Canes.  Walkers.  Scooters.  Crutches.  Turn on the lights when you go into a dark area. Replace any light bulbs as soon as they burn out.  Set up your furniture so you have a clear path. Avoid moving your furniture around.  If any of your floors are uneven, fix them.  If there are any pets around you, be aware of where they are.  Review your medicines with your doctor. Some medicines can make you feel dizzy. This can increase your chance of falling. Ask your doctor what other things that you can do to help prevent falls. This information is not intended to replace advice given to you by your health care provider. Make sure you discuss any questions you have with your health care provider. Document Released: 04/12/2009 Document Revised: 11/22/2015 Document Reviewed: 07/21/2014  2017 Elsevier

## 2016-07-23 ENCOUNTER — Ambulatory Visit (HOSPITAL_COMMUNITY): Payer: Medicare Other

## 2016-07-23 DIAGNOSIS — R2689 Other abnormalities of gait and mobility: Secondary | ICD-10-CM

## 2016-07-23 DIAGNOSIS — R600 Localized edema: Secondary | ICD-10-CM | POA: Diagnosis not present

## 2016-07-23 DIAGNOSIS — M25661 Stiffness of right knee, not elsewhere classified: Secondary | ICD-10-CM

## 2016-07-23 DIAGNOSIS — M25561 Pain in right knee: Secondary | ICD-10-CM | POA: Diagnosis not present

## 2016-07-23 LAB — TSH: TSH: 0.68 mIU/L

## 2016-07-23 LAB — T4, FREE: FREE T4: 1.1 ng/dL (ref 0.8–1.8)

## 2016-07-23 NOTE — Therapy (Signed)
[place], [place], [address] Phone: [phone]   Fax:  [phone]  Physical Therapy Treatment  Patient Details  Name: Tamara Lam MRN: [id number] Date of Birth: 1951-01-07 Referring Provider: Gaynelle Arabian, MD  Encounter Date: 07/23/2016      PT End of Session - 07/23/16 1438    Visit Number 11   Number of Visits 17   Date for PT Re-Evaluation 08/06/16   Authorization Type [employer] Medicare   Authorization Time Period 06/11/16 to 08/06/16   PT Start Time 1435   PT Stop Time 1520   PT Time Calculation (min) 45 min   Activity Tolerance Patient tolerated treatment well;Patient limited by fatigue   Behavior During Therapy [facility] for tasks assessed/performed      Past Medical History:  Diagnosis Date  . Abnormal CXR (chest x-ray)    Evaluated with Pulmonary- Dr. Luan Pulling- "pt. was told probably due to thin body frame"-saw no issues-PFT test normal.  . Anxiety 1980's   . Arthritis    osteoarthritis -knees, hands  . Chronic constipation    tx. Linzess  . Chronic nausea   . Diabetes mellitus without complication ([place])    [id number] level checked borderline x1, then further evaluated -Diabetes ruled out.  . Glaucoma 2004   Dr. Venetia Maxon, laser to left eye Sept 2011, bilateral eye drops for this  . Helicobacter pylori gastritis 04/2004   last EGD   . Hematochezia   . Hx of colonoscopy 08/2005   Dr. Gala Romney / hemorrhoids   . Thyroid disease    thyroid goiter, nodules-no problems.  . Weight loss     Past Surgical History:  Procedure Laterality Date  . ABDOMINAL HYSTERECTOMY    . arthoscopic surgery on right knee  2007  . CHOLECYSTECTOMY  1990's  . COLONOSCOPY  2011   Dr. Gala Romney: anal canal/internal hemorrhoids, dimintuive rectosigmoid polyp (polypoid rectal mucosa), few sigmoid diverticula   . COLONOSCOPY N/A 10/30/2015   Dr. Gala Romney: diverticulosis, hemorrhoids, surveillance in 2022   . TOTAL KNEE ARTHROPLASTY Right  05/26/2016   Procedure: RIGHT TOTAL KNEE ARTHROPLASTY;  Surgeon: Gaynelle Arabian, MD;  Location: [facility];  Service: Orthopedics;  Laterality: Right;  Marland Kitchen VESICOVAGINAL FISTULA CLOSURE W/ TAH  1989    There were no vitals filed for this visit.      Subjective Assessment - 07/23/16 1431    Subjective Pt stated she is feeling good today, reports some increased pain following manual last session.     Pertinent History anxiety, glaucoma, DM, Rt TKR   Patient Stated Goals improve knee strength/ROM   Currently in Pain? No/denies            [facility] Adult PT Treatment/Exercise - 07/23/16 0001      Knee/Hip Exercises: Stretches   Active Hamstring Stretch 3 reps;30 seconds   Active Hamstring Stretch Limitations supine with rope   Gastroc Stretch 3 reps;30 seconds   Gastroc Stretch Limitations slant board     Knee/Hip Exercises: Standing   Heel Raises 15 reps   Heel Raises Limitations toe raises on slope   Terminal Knee Extension Limitations TKE infront of mat with tactile cueing for proper form/tech   Functional Squat 10 reps   SLS Lt 22", Rt 15" max of 3   Gait Training Cueing to improve heel strike with gait   Other Standing Knee Exercises retro gait to improve knee extension wtih gait     Knee/Hip Exercises: Seated   Long  [employer] Right;2 sets;15 reps   Long [employer] Weight 3 lbs.   Hamstring Curl Right;15 reps   Hamstring Limitations RTB 5" holds   Sit to Sand 10 reps;without UE support  lowest mat height, cueing for eccentric control     Knee/Hip Exercises: Supine   Quad Sets Right;1 set;10 reps   Quad Sets Limitations tactile and verbal cueing to improve quad contraction and reduce gluteal activation   Short Arc Quad Sets Right;20 reps   Knee Extension PROM;4 sets   Knee Extension Limitations per pt tolerance     Manual Therapy   Manual Therapy Joint mobilization;Myofascial release;Passive ROM   Manual therapy comments separate rest of session   Edema Management retrograde  massage with RLE    Joint Mobilization Patella mobs all directionsRt Grade III/IV proximal fibular mobs; Grade III/IV closed chain femorial medial rotation mobs   Myofascial Release scar tissue massage at proximal incision complete; instructed technique to pt and daughter present for apt   Passive ROM Rt knee extension 10x10 sec hold                 PT Education - 07/23/16 1458    Education provided Yes   Education Details Pt and daughter educated on benefits and technqiue with manual scar tissue massage and myofascial release technqiues   Person(s) Educated Patient;Child(ren)   Methods Explanation;Demonstration;Tactile cues;Other (comment)  Instructed manual techniques for daugther to begin at home   Comprehension Verbalized understanding;Returned demonstration;Need further instruction          PT Short Term Goals - 06/11/16 1518      PT SHORT TERM GOAL #1   Title Pt will demo consistency and independence with HEP to improve knee ROM and mobility.   Time 2   Period Weeks   Status New     PT SHORT TERM GOAL #2   Title Pt will demo proper sit to stand technique with minimal weight shift to the Lt during her sessions without verbal cues from therapist, to decrease risk of overuse of her LLE.    Time 4   Period Weeks   Status New     PT SHORT TERM GOAL #3   Title Pt will demo improved Rt knee ROM to lacking no more than 5 degrees knee extension to improve her mechanics with ambulation.    Time 4   Period Weeks   Status New           PT Long Term Goals - 06/11/16 1520      PT LONG TERM GOAL #1   Title Pt will demo improved BLE strength to 5/5 MMT, to increase safety with daily activity.    Time 8   Period Weeks   Status New     PT LONG TERM GOAL #2   Title Pt will demo improved Rt knee ROM to 0-115 degrees to improve her ability to transition in/out of the car.    Time 8   Period Weeks   Status New     PT LONG TERM GOAL #3   Title Pt will demo improved  functional strength and power evident by her ability to complete 5x sit to stand in less than 12 sec without UE support.    Time 8   Period Weeks   Status New     PT LONG TERM GOAL #4   Title Pt will perform TUG in less than 10 sec without an AD, to indicate she is at a  decreased risk of falling in the community.   Time 8   Period Weeks   Status New               Plan - 07/23/16 1503    Clinical Impression Statement Pt entered dept stated she has increased pain following manual techniques last session.  Educated benefits/purpose with manual and completed retro massage and scar tissue massage for pain and to release fascial restrictions limiting knee extension.  Daughter with pt this session;educated technqiues to begin at home and able to complete correclty with minimal cueing.  Continued session focus on improving knee extension and functional strengthening/balance training.  Pt continues to have limited extension, gait training and tactile cueing to improve knee extension with functional tasks.     Rehab Potential Good   PT Frequency 2x / week   PT Duration 8 weeks   PT Treatment/Interventions Cryotherapy;ADLs/Self Care Home Management;Moist Heat;Therapeutic exercise;Therapeutic activities;Functional mobility training;Stair training;Gait training;Balance training;Neuromuscular re-education;Patient/family education;Manual techniques;Dry needling;Passive range of motion;Scar mobilization   PT Next Visit Plan knee extension>flexion, hamstring/gastroc stretching; functional strengthening and balance   PT Home Exercise Plan seated knee flexion stretch hold, knee extension stetch x30 min with weight, sit to stand with equal weight bearing       Patient will benefit from skilled therapeutic intervention in order to improve the following deficits and impairments:  Abnormal gait, Decreased activity tolerance, Decreased balance, Decreased endurance, Decreased mobility, Decreased skin  integrity, Decreased strength, Decreased range of motion, Increased edema, Impaired flexibility, Pain, Improper body mechanics, Hypomobility  Visit Diagnosis: Acute pain of right knee  Other abnormalities of gait and mobility  Localized edema  Stiffness of right knee, not elsewhere classified     Problem List Patient Active Problem List   Diagnosis Date Noted  . Right forearm pain 05/15/2016  . Preoperative general physical examination 05/15/2016  . Dermatitis 12/24/2015  . Welcome to Medicare preventive visit 11/13/2015  . Diverticulosis of colon without hemorrhage   . Diabetes mellitus without complication ([place]) [id number]  . Annual physical exam 03/30/2015  . History of colonic polyps 02/05/2015  . OA (osteoarthritis) of knee 10/22/2014  . Glaucoma 08/20/2013  . Nontoxic multinodular goiter 08/01/2013  . GERD (gastroesophageal reflux disease) 04/23/2013  . Osteopenia 06/16/2012  . Abnormal TSH 09/22/2011  . Hyperlipidemia LDL goal <100 12/27/2009  . CONSTIPATION, CHRONIC 09/24/2009  . ANXIETY 08/16/2009   Ihor Austin, [place]; [place]  Aldona Lento 07/23/2016, 3:38 PM  [facility] [address] [place], [place], [address] Phone: [phone]   Fax:  [phone]  Name: Tamara Lam MRN: [id number] Date of Birth: 1951-03-15

## 2016-07-25 ENCOUNTER — Encounter (HOSPITAL_COMMUNITY): Payer: Medicare Other | Admitting: Physical Therapy

## 2016-07-25 ENCOUNTER — Encounter: Payer: Self-pay | Admitting: Family Medicine

## 2016-07-25 ENCOUNTER — Ambulatory Visit (INDEPENDENT_AMBULATORY_CARE_PROVIDER_SITE_OTHER): Payer: Medicare Other | Admitting: Family Medicine

## 2016-07-25 ENCOUNTER — Ambulatory Visit (HOSPITAL_COMMUNITY): Payer: Medicare Other | Admitting: Physical Therapy

## 2016-07-25 VITALS — BP 120/80 | HR 65 | Temp 98.4°F | Resp 16 | Ht 67.0 in | Wt 119.0 lb

## 2016-07-25 DIAGNOSIS — R600 Localized edema: Secondary | ICD-10-CM

## 2016-07-25 DIAGNOSIS — K219 Gastro-esophageal reflux disease without esophagitis: Secondary | ICD-10-CM

## 2016-07-25 DIAGNOSIS — D62 Acute posthemorrhagic anemia: Secondary | ICD-10-CM | POA: Diagnosis not present

## 2016-07-25 DIAGNOSIS — R110 Nausea: Secondary | ICD-10-CM | POA: Diagnosis not present

## 2016-07-25 DIAGNOSIS — R6883 Chills (without fever): Secondary | ICD-10-CM

## 2016-07-25 DIAGNOSIS — M25561 Pain in right knee: Secondary | ICD-10-CM | POA: Diagnosis not present

## 2016-07-25 DIAGNOSIS — E785 Hyperlipidemia, unspecified: Secondary | ICD-10-CM

## 2016-07-25 DIAGNOSIS — F411 Generalized anxiety disorder: Secondary | ICD-10-CM

## 2016-07-25 DIAGNOSIS — F5105 Insomnia due to other mental disorder: Secondary | ICD-10-CM

## 2016-07-25 DIAGNOSIS — M25661 Stiffness of right knee, not elsewhere classified: Secondary | ICD-10-CM

## 2016-07-25 DIAGNOSIS — R2689 Other abnormalities of gait and mobility: Secondary | ICD-10-CM | POA: Diagnosis not present

## 2016-07-25 DIAGNOSIS — F409 Phobic anxiety disorder, unspecified: Secondary | ICD-10-CM

## 2016-07-25 DIAGNOSIS — J302 Other seasonal allergic rhinitis: Secondary | ICD-10-CM

## 2016-07-25 MED ORDER — HYDROXYZINE HCL 10 MG PO TABS: ORAL_TABLET | ORAL | 2 refills | Status: DC

## 2016-07-25 MED ORDER — BENZONATATE 100 MG PO CAPS: 100.0000 mg | ORAL_CAPSULE | Freq: Two times a day (BID) | ORAL | 0 refills | Status: DC | PRN

## 2016-07-25 NOTE — Progress Notes (Signed)
   Tamara Lam     MRN: [id number]      DOB: April 25, 1951   HPI Tamara Lam  5 day h/o head pressure esp on right side of face and also chest congestion with non productive cough, the latter is not a new complaint C/o chronic nausea and some reflux symptoms , she has had a cholecyttectomy, has upcoming GI eval She is s/p right knee replacement, find PT challenging and painful at times but is working through this  ROS C/o  chills.  Denies chest pains, palpitations and leg swelling Denies ,diarrhea or constipation.   Denies dysuria, frequency, hesitancy or incontinence. Denies joint pain, swelling and limitation in mobility. Denies headaches, seizures, numbness, or tingling. Denies depression,  Has mild increase in anxiety and insomnia. Denies skin break down or rash.   PE  BP 120/80   Pulse 65   Temp 98.4 F (36.9 C) (Oral)   Resp 16   Ht 5\' 7"  (1.702 m)   Wt 119 lb (54 kg)   SpO2 100%   BMI 18.64 kg/m   Patient alert and oriented and in no cardiopulmonary distress.  HEENT: No facial asymmetry, EOMI,   oropharynx pink and moist.  Neck supple no JVD, no mass.Mild frontal and maxillary sinus tenderness, no erythema or edema of nasal mucosa, no excess nasal drainage Chest: Clear to auscultation bilaterally.  CVS: S1, S2 no murmurs, no S3.Regular rate.  ABD: Soft mild epigastric tenderness, no guarding or rebound Normal BS  Ext: No edema  MS: Adequate ROM spine, shoulders, hips and reduced in left  knee.  Skin: Intact, no ulcerations or rash noted.  Psych: Good eye contact, normal affect. Memory intact not anxious or depressed appearing.  CNS: CN 2-12 intact, power,  normal throughout.no focal deficits noted.   Assessment & Plan  Nausea without vomiting Chronic nausea with bloating s/p cholecystectomy, has GERD symptoms at times, check breath test, may need PPI trial  Chills Intermittent chills s/p knee replacement and post op anemia, check cbc and  cmp  Postoperative anemia due to acute blood loss Updated lab needed at/ before next visit. corrected  Allergic rhinitis Bedtime hydroxyzine, which will assist with management of allergies  Insomnia due to anxiety and fear Sleep hygiene reviewed and written information offered also. Prescription sent for  medication needed. Bedtime hydroxyzine  Hyperlipidemia LDL goal <100 Hyperlipidemia:Low fat diet discussed and encouraged.   Lipid Panel  Lab Results  Component Value Date   CHOL 162 05/05/2016   HDL 69 05/05/2016   LDLCALC 80 05/05/2016   TRIG 67 05/05/2016   CHOLHDL 2.3 05/05/2016     Updated lab needed at/ before next visit.   GERD (gastroesophageal reflux disease) May be causing chronic nausea and require long term PPI, will defer to GI  Anxiety state In flare/ panic attack uses valium

## 2016-07-25 NOTE — Patient Instructions (Addendum)
F/u as before, call if you need me sooner  Decongestant pills and medication to help wtih sleep and allergies are prescribed Breath test today   I will try medication for reflux after breath test result and  I will ask GI to help with this also

## 2016-07-25 NOTE — Therapy (Signed)
[place], [place], [address] Phone: [phone]   Fax:  [phone]  Physical Therapy Treatment/Discharge  Patient Details  Name: Tamara Lam MRN: [id number] Date of Birth: [birth year] Referring Provider: Gaynelle Arabian, MD  Encounter Date: 07/25/2016      PT End of Session - 07/25/16 1349    Visit Number 12   Number of Visits 17   Date for PT Re-Evaluation 08/06/16   Authorization Type [employer] Medicare   Authorization Time Period 06/11/16 to 08/06/16   PT Start Time 1302   PT Stop Time 1346   PT Time Calculation (min) 44 min   Activity Tolerance Patient tolerated treatment well;No increased pain   Behavior During Therapy WFL for tasks assessed/performed      Past Medical History:  Diagnosis Date  . Abnormal CXR (chest x-ray)    Evaluated with Pulmonary- Dr. Luan Pulling- "pt. was told probably due to thin body frame"-saw no issues-PFT test normal.  . Anxiety 1980's   . Arthritis    osteoarthritis -knees, hands  . Chronic constipation    tx. Linzess  . Chronic nausea   . Diabetes mellitus without complication ([place])    M0Q level checked borderline x1, then further evaluated -Diabetes ruled out.  . Glaucoma 2004   Dr. Venetia Maxon, laser to left eye Sept 2011, bilateral eye drops for this  . Helicobacter pylori gastritis 04/2004   last EGD   . Hematochezia   . Hx of colonoscopy 08/2005   Dr. Gala Romney / hemorrhoids   . Thyroid disease    thyroid goiter, nodules-no problems.  . Weight loss     Past Surgical History:  Procedure Laterality Date  . ABDOMINAL HYSTERECTOMY    . arthoscopic surgery on right knee  2007  . CHOLECYSTECTOMY  1990's  . COLONOSCOPY  2011   Dr. Gala Romney: anal canal/internal hemorrhoids, dimintuive rectosigmoid polyp (polypoid rectal mucosa), few sigmoid diverticula   . COLONOSCOPY N/A 10/30/2015   Dr. Gala Romney: diverticulosis, hemorrhoids, surveillance in 2022   . TOTAL KNEE ARTHROPLASTY Right  05/26/2016   Procedure: RIGHT TOTAL KNEE ARTHROPLASTY;  Surgeon: Gaynelle Arabian, MD;  Location: [facility];  Service: Orthopedics;  Laterality: Right;  Marland Kitchen VESICOVAGINAL FISTULA CLOSURE W/ TAH  1989    There were no vitals filed for this visit.      Subjective Assessment - 07/25/16 1306    Subjective Pt reports things are going well. She has been doing her HEP regularly without any issues. She feels she has improved about 80% since beginning PT. Her remaining limitations are the stiffness and difficulty straightening her knee out.    Pertinent History anxiety, glaucoma, DM, Rt TKR   How long can you sit comfortably? unlimited    How long can you stand comfortably? unlimited    How long can you walk comfortably? an hour, maybe longer until her knee starts to bother her.    Patient Stated Goals improve knee strength/ROM   Currently in Pain? No/denies            [facility] PT Assessment - 07/25/16 0001      Assessment   Medical Diagnosis Rt TKR   Referring Provider Gaynelle Arabian, MD   Onset Date/Surgical Date 05/26/16   Next MD Visit 08/19/16   Prior Therapy [facility] for 2 weeks      Precautions   Precautions None     Balance Screen   Has the patient fallen in the past 6 months No  Has the patient had a decrease in activity level because of a fear of falling?  No   Is the patient reluctant to leave their home because of a fear of falling?  No     Cognition   Overall Cognitive Status Within Functional Limits for tasks assessed     Observation/Other Assessments   Observations using SPC only occasionally if her leg starts to hurt or feels tired      Sensation   Light Touch Appears Intact     Functional Tests   Functional tests Single leg stance     Single Leg Stance   Comments Rt: 15-20 sec, Lt: 20+ sec   x2 trials     AROM   Right Knee Extension 10   Right Knee Flexion 105  105-110     Strength   Right Hip Flexion 5/5   Right Hip Extension 4+/5   Right Hip ABduction 5/5    Left Hip Extension 4+/5   Left Hip ABduction 5/5   Right Knee Flexion 5/5   Right Knee Extension 5/5   Left Knee Flexion 5/5   Left Knee Extension 5/5     Transfers   Five time sit to stand comments  11 sec, no UE support and minimal weight shift Lt      Ambulation/Gait   Gait Comments Rt knee flexion in midstance      High Level Balance   High Level Balance Comments TUG: 7 sec, no AD                     [facility] Adult PT Treatment/Exercise - 07/25/16 0001      Ambulation/Gait   Pre-Gait Activities Ascend/descend 4, 6" steps without handrails initially with Rt hip abd secondary avoidance of knee flexion. 2nd trial with improved technique following demonstration from therapist.      Knee/Hip Exercises: Aerobic   Other Aerobic upright bike x4 min during education regarding  setup/use at home                 PT Education - 07/25/16 1432    Education provided Yes   Education Details discussed goals/remaining areas of limitation; updated HEP; discussed set up/use of bike at home to address knee ROM limitations    Person(s) Educated Patient   Methods Explanation;Demonstration;Verbal cues;Handout   Comprehension Verbalized understanding;Returned demonstration          PT Short Term Goals - 07/25/16 1359      PT SHORT TERM GOAL #1   Title Pt will demo consistency and independence with HEP to improve knee ROM and mobility.   Time 2   Period Weeks   Status Achieved     PT SHORT TERM GOAL #2   Title Pt will demo proper sit to stand technique with minimal weight shift to the Lt during her sessions without verbal cues from therapist, to decrease risk of overuse of her LLE.    Time 4   Period Weeks   Status Achieved     PT SHORT TERM GOAL #3   Title Pt will demo improved Rt knee ROM to lacking no more than 5 degrees knee extension to improve her mechanics with ambulation.    Baseline lacking ~10 deg    Time 4   Period Weeks   Status Partially Met            PT Long Term Goals - 07/25/16 1359      PT LONG TERM GOAL #  1   Title Pt will demo improved BLE strength to 5/5 MMT, to increase safety with daily activity.    Baseline hip ext 4+/5 MMT   Time 8   Period Weeks   Status Partially Met     PT LONG TERM GOAL #2   Title Pt will demo improved Rt knee ROM to 0-115 degrees to improve her ability to transition in/out of the car.    Baseline flexion up to 110-115 deg    Time 8   Period Weeks   Status Partially Met     PT LONG TERM GOAL #3   Title Pt will demo improved functional strength and power evident by her ability to complete 5x sit to stand in less than 12 sec without UE support.    Time 8   Period Weeks   Status Achieved     PT LONG TERM GOAL #4   Title Pt will perform TUG in less than 10 sec without an AD, to indicate she is at a decreased risk of falling in the community.   Time 8   Period Weeks   Status Achieved               Plan - July 29, 2016 1355    Clinical Impression Statement Pt was reassessed this visit having made good progress towards goals, meeting all but 2 of her goals. She is performing functional testing and balance activity without noted difficulty and is able to maintain single leg balance on her Rt up to 15 sec without LOB. She has no difficulty with stair negotiation, noting slight compensation of hip abduction in an attempt to avoid excessive knee flexion, which was improved after instruction/demonstration was provided for proper technique. Pt's remaining limitation is her knee ROM, evident by her lacking ~10 deg of knee extension and only having ~110 deg of knee flexion. This has remained unchanged over the past couple of sessions despite therapist efforts during her treatments and pt reported participation in her HEP. At this time, she should continue to see some improvements in knee ROM/stiffness as she makes the proper adjustments in functional performance and performs her HEP as instructed.  Demonstrated proper set up and use of the upright bicycle for her to use at home as well. She is being discharged at this time being pleased with her progress and with independence with an updated HEP to address remaining limitations.    Rehab Potential Good   PT Frequency 2x / week   PT Duration 8 weeks   PT Treatment/Interventions Cryotherapy;ADLs/Self Care Home Management;Moist Heat;Therapeutic exercise;Therapeutic activities;Functional mobility training;Stair training;Gait training;Balance training;Neuromuscular re-education;Patient/family education;Manual techniques;Dry needling;Passive range of motion;Scar mobilization   PT Next Visit Plan knee extension>flexion, hamstring/gastroc stretching; functional strengthening and balance   PT Home Exercise Plan seated knee flexion stretch hold, knee extension stetch x30 min with weight, use of upright bike   Consulted and Agree with Plan of Care Patient      Patient will benefit from skilled therapeutic intervention in order to improve the following deficits and impairments:  Abnormal gait, Decreased activity tolerance, Decreased balance, Decreased endurance, Decreased mobility, Decreased skin integrity, Decreased strength, Decreased range of motion, Increased edema, Impaired flexibility, Pain, Improper body mechanics, Hypomobility  Visit Diagnosis: Acute pain of right knee  Other abnormalities of gait and mobility  Localized edema  Stiffness of right knee, not elsewhere classified       G-Codes - 07/29/2016 1400    Functional Assessment Tool Used Clinical judgement based on  assessment of ROM, strength and activity tolerance.    Functional Limitation Mobility: Walking and moving around   Mobility: Walking and Moving Around Goal Status [phone]) At least 20 percent but less than 40 percent impaired, limited or restricted   Mobility: Walking and Moving Around Discharge Status [id number]) At least 20 percent but less than 40 percent impaired, limited  or restricted      Problem List Patient Active Problem List   Diagnosis Date Noted  . Right forearm pain 05/15/2016  . Preoperative general physical examination 05/15/2016  . Dermatitis 12/24/2015  . Welcome to Medicare preventive visit 11/13/2015  . Diverticulosis of colon without hemorrhage   . Diabetes mellitus without complication ([place]) 67/34/1937  . Annual physical exam 03/30/2015  . History of colonic polyps 02/05/2015  . OA (osteoarthritis) of knee 10/22/2014  . Glaucoma 08/20/2013  . Nontoxic multinodular goiter 08/01/2013  . GERD (gastroesophageal reflux disease) 04/23/2013  . Osteopenia 06/16/2012  . Abnormal TSH 09/22/2011  . Hyperlipidemia LDL goal <100 12/27/2009  . CONSTIPATION, CHRONIC 09/24/2009  . ANXIETY 08/16/2009     PHYSICAL THERAPY DISCHARGE SUMMARY  Visits from Start of Care: 12  Current functional level related to goals / functional outcomes: See above for current functional level. Performing at age appropriate levels with all functional testing.    Remaining deficits: Knee ROM deficits. See above for more details.    Education / Equipment: discussed goals/remaining areas of limitation; updated HEP; discussed set up/use of bike at home to address knee ROM limitations  Plan: Patient agrees to discharge.  Patient goals were partially met. Patient is being discharged due to being pleased with the current functional level.  ?????       2:33 PM,07/25/16 Elly Modena PT, DPT [facility] [place] [address] [place], [place], [address] Phone: [phone]   Fax:  [phone]  Name: DONNAMAE MUILENBURG MRN: [id number] Date of Birth: September 16, 1950

## 2016-07-26 DIAGNOSIS — F409 Phobic anxiety disorder, unspecified: Secondary | ICD-10-CM | POA: Insufficient documentation

## 2016-07-26 DIAGNOSIS — F5105 Insomnia due to other mental disorder: Secondary | ICD-10-CM

## 2016-07-26 DIAGNOSIS — R110 Nausea: Secondary | ICD-10-CM | POA: Insufficient documentation

## 2016-07-26 DIAGNOSIS — D62 Acute posthemorrhagic anemia: Secondary | ICD-10-CM | POA: Insufficient documentation

## 2016-07-26 DIAGNOSIS — R6883 Chills (without fever): Secondary | ICD-10-CM | POA: Insufficient documentation

## 2016-07-26 LAB — CBC
HEMATOCRIT: 40.1 % (ref 35.0–45.0)
Hemoglobin: 12.2 g/dL (ref 11.7–15.5)
MCH: 27.5 pg (ref 27.0–33.0)
MCHC: 30.4 g/dL — ABNORMAL LOW (ref 32.0–36.0)
MCV: 90.3 fL (ref 80.0–100.0)
MPV: 11 fL (ref 7.5–12.5)
Platelets: 217 10*3/uL (ref 140–400)
RBC: 4.44 MIL/uL (ref 3.80–5.10)
RDW: 14.8 % (ref 11.0–15.0)
WBC: 6.1 10*3/uL (ref 3.8–10.8)

## 2016-07-26 LAB — BASIC METABOLIC PANEL
BUN: 11 mg/dL (ref 7–25)
CHLORIDE: 103 mmol/L (ref 98–110)
CO2: 32 mmol/L — ABNORMAL HIGH (ref 20–31)
Calcium: 9.3 mg/dL (ref 8.6–10.4)
Creat: 0.68 mg/dL (ref 0.50–0.99)
Glucose, Bld: 82 mg/dL (ref 65–99)
POTASSIUM: 4.2 mmol/L (ref 3.5–5.3)
Sodium: 141 mmol/L (ref 135–146)

## 2016-07-26 NOTE — Assessment & Plan Note (Signed)
In flare/ panic attack uses valium

## 2016-07-26 NOTE — Assessment & Plan Note (Signed)
Hyperlipidemia:Low fat diet discussed and encouraged.   Lipid Panel  Lab Results  Component Value Date   CHOL 162 05/05/2016   HDL 69 05/05/2016   LDLCALC 80 05/05/2016   TRIG 67 05/05/2016   CHOLHDL 2.3 05/05/2016     Updated lab needed at/ before next visit.

## 2016-07-26 NOTE — Assessment & Plan Note (Signed)
Intermittent chills s/p knee replacement and post op anemia, check cbc and cmp

## 2016-07-26 NOTE — Assessment & Plan Note (Signed)
Updated lab needed at/ before next visit. corrected

## 2016-07-26 NOTE — Assessment & Plan Note (Signed)
Sleep hygiene reviewed and written information offered also. Prescription sent for  medication needed. Bedtime hydroxyzine

## 2016-07-26 NOTE — Assessment & Plan Note (Signed)
May be causing chronic nausea and require long term PPI, will defer to GI

## 2016-07-26 NOTE — Assessment & Plan Note (Signed)
Bedtime hydroxyzine, which will assist with management of allergies

## 2016-07-26 NOTE — Assessment & Plan Note (Signed)
Chronic nausea with bloating s/p cholecystectomy, has GERD symptoms at times, check breath test, may need PPI trial

## 2016-07-28 LAB — H. PYLORI BREATH TEST: H. pylori Breath Test: NOT DETECTED

## 2016-08-01 ENCOUNTER — Ambulatory Visit (HOSPITAL_COMMUNITY)
Admission: RE | Admit: 2016-08-01 | Discharge: 2016-08-01 | Disposition: A | Discharge status: Home / Self Care | Payer: Medicare Other | Source: Ambulatory Visit | Attending: "Endocrinology | Admitting: "Endocrinology

## 2016-08-01 ENCOUNTER — Ambulatory Visit (HOSPITAL_COMMUNITY)
Admission: RE | Admit: 2016-08-01 | Discharge: 2016-08-01 | Disposition: A | Discharge status: Home / Self Care | Payer: Medicare Other | Source: Ambulatory Visit | Attending: Family Medicine | Admitting: Family Medicine

## 2016-08-01 ENCOUNTER — Encounter: Payer: Self-pay | Admitting: Family Medicine

## 2016-08-01 DIAGNOSIS — M8580 Other specified disorders of bone density and structure, unspecified site: Secondary | ICD-10-CM | POA: Diagnosis present

## 2016-08-01 DIAGNOSIS — M8588 Other specified disorders of bone density and structure, other site: Secondary | ICD-10-CM | POA: Diagnosis not present

## 2016-08-01 DIAGNOSIS — E042 Nontoxic multinodular goiter: Secondary | ICD-10-CM | POA: Insufficient documentation

## 2016-08-01 DIAGNOSIS — M85851 Other specified disorders of bone density and structure, right thigh: Secondary | ICD-10-CM | POA: Diagnosis not present

## 2016-08-01 DIAGNOSIS — E049 Nontoxic goiter, unspecified: Secondary | ICD-10-CM

## 2016-08-01 DIAGNOSIS — M8589 Other specified disorders of bone density and structure, multiple sites: Secondary | ICD-10-CM | POA: Diagnosis not present

## 2016-08-04 ENCOUNTER — Encounter: Payer: Self-pay | Admitting: Gastroenterology

## 2016-08-04 ENCOUNTER — Ambulatory Visit (INDEPENDENT_AMBULATORY_CARE_PROVIDER_SITE_OTHER): Payer: Medicare Other | Admitting: Gastroenterology

## 2016-08-04 VITALS — BP 109/73 | HR 74 | Temp 98.1°F | Ht 67.0 in | Wt 119.0 lb

## 2016-08-04 DIAGNOSIS — K5909 Other constipation: Secondary | ICD-10-CM | POA: Diagnosis not present

## 2016-08-04 DIAGNOSIS — R110 Nausea: Secondary | ICD-10-CM

## 2016-08-04 NOTE — Assessment & Plan Note (Addendum)
Unclear etiology but seems to be linked with "hot flashes". H.pylori breath test negative. I have asked her to keep a record of when this occurs. Could be atypical GERD symptoms. Gallbladder absent. No alarm features. Patient declining supportive measures with anti-emetic. Will trial PPI if she notices any pattern that is inconsistent with relation to hot flashes. For now, monitor.

## 2016-08-04 NOTE — Progress Notes (Signed)
cc'ed to pcp °

## 2016-08-04 NOTE — Progress Notes (Signed)
Referring Provider: Fayrene Helper, MD Primary Care Physician:  Tula Nakayama, MD  Primary GI: Dr. Gala Romney   Chief Complaint  Patient presents with  . Constipation    f/u, doing ok, Linzess helping    HPI:   Tamara Lam is a 66 y.o. female presenting today with a history of chronic constipation managed with Linzess 290, next colonoscopy in 2022, here for 6 month follow-up. Saw her PCP and was having nausea. H.pylori breath test negative.   Just had total right knee replacement May 26, 2016. Just finished rehab the 26th of January. Chronic nausea, will have a hot flash that comes on and nausea occurs with it. Hot flashes will sometimes awaken her, and she will sometimes feel a sickness coming over her. Nausea is gone after hot flash. No typical, chronic GERD symptoms. Sometimes food will not agree with her. Loves lasagna and spaghetti but will not agree with her.   Linzess 290 mcg daily is working well for her. Taking a probiotic daily. Good appetite.   Past Medical History:  Diagnosis Date  . Abnormal CXR (chest x-ray)    Evaluated with Pulmonary- Dr. Luan Pulling- "pt. was told probably due to thin body frame"-saw no issues-PFT test normal.  . Anxiety 1980's   . Arthritis    osteoarthritis -knees, hands  . Chronic constipation    tx. Linzess  . Chronic nausea   . Diabetes mellitus without complication ([place])    [id number] level checked borderline x1, then further evaluated -Diabetes ruled out.  . Glaucoma 2004   Dr. Venetia Maxon, laser to left eye Sept 2011, bilateral eye drops for this  . Helicobacter pylori gastritis 04/2004   last EGD   . Hematochezia   . Hx of colonoscopy 08/2005   Dr. Gala Romney / hemorrhoids   . Thyroid disease    thyroid goiter, nodules-no problems.  . Weight loss     Past Surgical History:  Procedure Laterality Date  . ABDOMINAL HYSTERECTOMY    . arthoscopic surgery on right knee  2007  . CHOLECYSTECTOMY  1990's  . COLONOSCOPY  2011   Dr. Gala Romney: anal  canal/internal hemorrhoids, dimintuive rectosigmoid polyp (polypoid rectal mucosa), few sigmoid diverticula   . COLONOSCOPY N/A 10/30/2015   Dr. Gala Romney: diverticulosis, hemorrhoids, surveillance in 2022   . TOTAL KNEE ARTHROPLASTY Right 05/26/2016   Procedure: RIGHT TOTAL KNEE ARTHROPLASTY;  Surgeon: Gaynelle Arabian, MD;  Location: [facility];  Service: Orthopedics;  Laterality: Right;  Marland Kitchen VESICOVAGINAL FISTULA CLOSURE W/ TAH  1989    Current Outpatient Prescriptions  Medication Sig Dispense Refill  . acetaminophen (TYLENOL) 500 MG tablet Take 500 mg by mouth daily as needed for moderate pain or headache.    . alendronate (FOSAMAX) 70 MG tablet TAKE 1 TABLET ONCE EVERY  WEEK IN THE MORNING 30  MINUTES BEFORE EATING WITH  8 OUNCES OF WATER . SIT UP  FOR 30 MIN AFTER TAKING. 12 tablet 1  . aspirin EC 81 MG tablet Take 81 mg by mouth daily.    . benzonatate (TESSALON) 100 MG capsule Take 1 capsule (100 mg total) by mouth 2 (two) times daily as needed for cough. 20 capsule 0  . Calcium Carb-Cholecalciferol (CALCIUM 1000 + D) 1000-800 MG-UNIT TABS Take 1 tablet by mouth daily.    . diazepam (VALIUM) 5 MG tablet TAKE 1 TABLET BY MOUTH DAILY AS NEEDED FOR ANXIETY. 90 tablet 0  . LINZESS 290 MCG CAPS capsule TAKE 1 CAPSULE BY MOUTH  DAILY BEFORE  BREAKFAST 90 capsule 3  . Multiple Vitamins-Minerals (CENTRUM ADULTS) TABS Take 1 tablet by mouth daily.    . simvastatin (ZOCOR) 20 MG tablet TAKE TABLET BY MOUTH AT  BEDTIME FOR CHOLESTEROL. 90 tablet 1  . traMADol (ULTRAM) 50 MG tablet Take 1-2 tablets (50-100 mg total) by mouth every 6 (six) hours as needed (mild pain). 80 tablet 1  . travoprost, benzalkonium, (TRAVATAN) 0.004 % ophthalmic solution Place 1 drop into both eyes at bedtime.      . vitamin C (ASCORBIC ACID) 500 MG tablet Take 500 mg by mouth daily.    . hydrOXYzine (ATARAX/VISTARIL) 10 MG tablet One tablet at bedtime (Patient not taking: Reported on 08/04/2016) 30 tablet 2   No current  facility-administered medications for this visit.     Allergies as of 08/04/2016  . (No Known Allergies)    Family History  Problem Relation Age of Onset  . Hypertension Mother   . Glaucoma Mother   . Diverticulosis Mother   . Heart disease Mother   . Lung cancer Father   . Mental illness Sister   . Cancer Sister   . Leukemia Brother     some form of leukemia  . Colon polyps Son   . Colon cancer Neg Hx     Social History   Social History  . Marital status: Widowed    Spouse name: N/A  . Number of children: N/A  . Years of education: N/A   Occupational History  . homemaker  Unemployed   Social History Main Topics  . Smoking status: Never Smoker  . Smokeless tobacco: Never Used     Comment: Never smoked  . Alcohol use No  . Drug use: No  . Sexual activity: Not Currently   Other Topics Concern  . None   Social History Narrative  . None    Review of Systems: As mentioned in HPI   Physical Exam: BP 109/73   Pulse 74   Temp 98.1 F (36.7 C) (Oral)   Ht 5\' 7"  (1.702 m)   Wt 119 lb (54 kg)   BMI 18.64 kg/m  General:   Alert and oriented. No distress noted. Pleasant and cooperative.  Head:  Normocephalic and atraumatic. Eyes:  Conjuctiva clear without scleral icterus. Mouth:  Oral mucosa pink and moist. Good dentition. No lesions. Abdomen:  +BS, soft, non-tender and non-distended. No rebound or guarding. No HSM or masses noted. Msk:  Right knee slightly larger than left, favors right leg s/p TKR Extremities:  Without edema. Neurologic:  Alert and  oriented x4;  grossly normal neurologically. Psych:  Alert and cooperative. Normal mood and affect.  Lab Results  Component Value Date   WBC 6.1 07/25/2016   HGB 12.2 07/25/2016   HCT 40.1 07/25/2016   MCV 90.3 07/25/2016   PLT 217 07/25/2016   Lab Results  Component Value Date   ALT 15 05/05/2016   AST 23 05/05/2016   ALKPHOS 40 05/05/2016   BILITOT 0.5 05/05/2016

## 2016-08-04 NOTE — Patient Instructions (Signed)
Please let me know if you notice a pattern with the nausea.   We will see you in 1 year. Continue Linzess 290 mcg once each morning, 30 minutes before breakfast.   Call us regarding nausea, if it worsens, or if it is not associated with the hot flashes.

## 2016-08-04 NOTE — Assessment & Plan Note (Signed)
Doing well on Linzess 290 mcg once daily. Refills provided. Return in 1 year.

## 2016-08-05 ENCOUNTER — Encounter: Payer: Self-pay | Admitting: "Endocrinology

## 2016-08-05 ENCOUNTER — Ambulatory Visit (INDEPENDENT_AMBULATORY_CARE_PROVIDER_SITE_OTHER): Payer: Medicare Other | Admitting: "Endocrinology

## 2016-08-05 VITALS — BP 119/79 | HR 74 | Ht 67.0 in | Wt 119.0 lb

## 2016-08-05 DIAGNOSIS — E119 Type 2 diabetes mellitus without complications: Secondary | ICD-10-CM | POA: Diagnosis not present

## 2016-08-05 DIAGNOSIS — E042 Nontoxic multinodular goiter: Secondary | ICD-10-CM | POA: Diagnosis not present

## 2016-08-05 NOTE — Progress Notes (Signed)
Subjective:    Patient ID: Tamara Lam, female    DOB: 01-02-1951, PCP Tula Nakayama, MD   Past Medical History:  Diagnosis Date  . Abnormal CXR (chest x-ray)    Evaluated with Pulmonary- Dr. Luan Pulling- "pt. was told probably due to thin body frame"-saw no issues-PFT test normal.  . Anxiety 1980's   . Arthritis    osteoarthritis -knees, hands  . Chronic constipation    tx. Linzess  . Chronic nausea   . Diabetes mellitus without complication ([facility])    [id number] level checked borderline x1, then further evaluated -Diabetes ruled out.  . Glaucoma 2004   Dr. Venetia Maxon, laser to left eye Sept 2011, bilateral eye drops for this  . Helicobacter pylori gastritis 04/2004   last EGD   . Hematochezia   . Hx of colonoscopy 08/2005   Dr. Gala Romney / hemorrhoids   . Thyroid disease    thyroid goiter, nodules-no problems.  . Weight loss    Past Surgical History:  Procedure Laterality Date  . ABDOMINAL HYSTERECTOMY    . arthoscopic surgery on right knee  2007  . CHOLECYSTECTOMY  1990's  . COLONOSCOPY  2011   Dr. Gala Romney: anal canal/internal hemorrhoids, dimintuive rectosigmoid polyp (polypoid rectal mucosa), few sigmoid diverticula   . COLONOSCOPY N/A 10/30/2015   Dr. Gala Romney: diverticulosis, hemorrhoids, surveillance in 2022   . TOTAL KNEE ARTHROPLASTY Right 05/26/2016   Procedure: RIGHT TOTAL KNEE ARTHROPLASTY;  Surgeon: Gaynelle Arabian, MD;  Location: [facility];  Service: Orthopedics;  Laterality: Right;  Marland Kitchen VESICOVAGINAL FISTULA CLOSURE W/ TAH  1989   Social History   Social History  . Marital status: Widowed    Spouse name: N/A  . Number of children: N/A  . Years of education: N/A   Occupational History  . homemaker  Unemployed   Social History Main Topics  . Smoking status: Never Smoker  . Smokeless tobacco: Never Used     Comment: Never smoked  . Alcohol use No  . Drug use: No  . Sexual activity: Not Currently   Other Topics Concern  . None   Social History Narrative  . None    Outpatient Encounter Prescriptions as of 08/05/2016  Medication Sig  . acetaminophen (TYLENOL) 500 MG tablet Take 500 mg by mouth daily as needed for moderate pain or headache.  . alendronate (FOSAMAX) 70 MG tablet TAKE 1 TABLET ONCE EVERY  WEEK IN THE MORNING 30  MINUTES BEFORE EATING WITH  8 OUNCES OF WATER . SIT UP  FOR 30 MIN AFTER TAKING.  Marland Kitchen aspirin EC 81 MG tablet Take 81 mg by mouth daily.  . benzonatate (TESSALON) 100 MG capsule Take 1 capsule (100 mg total) by mouth 2 (two) times daily as needed for cough.  . Calcium Carb-Cholecalciferol (CALCIUM 1000 + D) 1000-800 MG-UNIT TABS Take 1 tablet by mouth daily.  . diazepam (VALIUM) 5 MG tablet TAKE 1 TABLET BY MOUTH DAILY AS NEEDED FOR ANXIETY.  Marland Kitchen LINZESS 290 MCG CAPS capsule TAKE 1 CAPSULE BY MOUTH  DAILY BEFORE BREAKFAST  . Multiple Vitamins-Minerals (CENTRUM ADULTS) TABS Take 1 tablet by mouth daily.  . simvastatin (ZOCOR) 20 MG tablet TAKE TABLET BY MOUTH AT  BEDTIME FOR CHOLESTEROL.  Marland Kitchen traMADol (ULTRAM) 50 MG tablet Take 1-2 tablets (50-100 mg total) by mouth every 6 (six) hours as needed (mild pain).  Marland Kitchen travoprost, benzalkonium, (TRAVATAN) 0.004 % ophthalmic solution Place 1 drop into both eyes at bedtime.    . vitamin C (ASCORBIC  ACID) 500 MG tablet Take 500 mg by mouth daily.   No facility-administered encounter medications on file as of 08/05/2016.    ALLERGIES: No Known Allergies VACCINATION STATUS: Immunization History  Administered Date(s) Administered  . Influenza Whole 03/13/2010, 03/12/2011  . Influenza,inj,Quad PF,36+ Mos 04/24/2013, 02/20/2014, 03/27/2015, 04/28/2016  . Pneumococcal Conjugate-13 06/20/2014  . Pneumococcal Polysaccharide-23 05/12/2016  . Td 08/16/2009  . Zoster 02/17/2011    HPI  66 yr old female with medical hx as follows. She is here to follow up for her MNG and type 2 DM. She is s/p FNA of thyroid nodule which was benign last year. her repeat thyroid u/s showed steady bilateral thyroid  nodules, unchanged in size since last study.  Her new TFTs are WNL. her a1c is better at 5.6%.  She denies heat/cold intolerance. denies any family hx of thyroid dysfunction nor thyroid cancer. she denies exposure to neck radiation. No dysphagia , nor SOB. she has steady weight.  Review of Systems  Constitutional: stable weight , no fatigue, no subjective hyperthermia/hypothermia Eyes: no blurry vision, no xerophthalmia ENT: no sore throat, no nodules palpated in throat, no dysphagia/odynophagia, no hoarseness Cardiovascular: no CP/SOB/palpitations/leg swelling Respiratory: no cough/SOB Gastrointestinal: no N/V/D/C Musculoskeletal: no muscle/joint aches Skin: no rashes Neurological: no tremors/numbness/tingling/dizziness Psychiatric: no depression/anxiety   Objective:    BP 119/79   Pulse 74   Ht 5\' 7"  (1.702 m)   Wt 119 lb (54 kg)   BMI 18.64 kg/m   Wt Readings from Last 3 Encounters:  08/05/16 119 lb (54 kg)  08/04/16 119 lb (54 kg)  07/25/16 119 lb (54 kg)    Physical Exam Constitutional:  in NAD Eyes: PERRLA, EOMI, no exophthalmos ENT: moist mucous membranes, + thyromegaly, no cervical lymphadenopathy Cardiovascular: RRR, No MRG Respiratory: CTA B Gastrointestinal: abdomen soft, NT, ND, BS+ Musculoskeletal: no deformities, strength intact in all 4 Skin: moist, warm, no rashes Neurological: no tremor with outstretched hands, DTR normal in all 4  CMP ( most recent) CMP     Component Value Date/Time   NA 141 07/25/2016 1451   K 4.2 07/25/2016 1451   CL 103 07/25/2016 1451   CO2 32 (H) 07/25/2016 1451   GLUCOSE 82 07/25/2016 1451   BUN 11 07/25/2016 1451   CREATININE 0.68 07/25/2016 1451   CALCIUM 9.3 07/25/2016 1451   PROT 7.0 05/05/2016 1012   ALBUMIN 4.4 05/05/2016 1012   AST 23 05/05/2016 1012   ALT 15 05/05/2016 1012   ALKPHOS 40 05/05/2016 1012   BILITOT 0.5 05/05/2016 1012   GFRNONAA >60 05/28/2016 0435   GFRNONAA >89 05/05/2016 1012   GFRAA >60  05/28/2016 0435   GFRAA >89 05/05/2016 1012     Diabetic Labs (most recent): Lab Results  Component Value Date   HGBA1C 5.6 05/05/2016   HGBA1C 5.9 12/20/2014     Lipid Panel ( most recent) Lipid Panel     Component Value Date/Time   CHOL 162 05/05/2016 1012   TRIG 67 05/05/2016 1012   HDL 69 05/05/2016 1012   CHOLHDL 2.3 05/05/2016 1012   VLDL 13 05/05/2016 1012   LDLCALC 80 05/05/2016 1012   Thyroid ultrasound from 08/01/2016  IMPRESSION: 1. The previously biopsied dominant approximately 2.1 cm nodule within the right lobe of the thyroid is grossly unchanged compared to the 09/2011 examination. Correlation prior biopsy results is recommended. 2. None of the additional discretely measured thyroid nodules, the majority of which appear either spongiform or cystic in composition, meet imaging  criteria to recommend percutaneous sampling or dedicated follow-up. The above is in keeping with the ACR TI-RADS recommendations Natasha Mead Coll Radiol 2017;14:587-595.   06/21/2015: Thyroid function test within normal limits, A1c 5.9%.   Assessment & Plan:   1. Nontoxic multinodular goiter  Her repeat thyroid ultrasound shows essentially unchanged multinodular goiter since last study. The right lobe nodule which was biopsied in the past is now 2.1 cm (previously 1.9 cm).  Her prior FINE NEEDLE ASPIRATION of RIGHT lobe nodule: FINDINGS CONSISTENT WITH HYPERPLASTIC LESION. - Her thyroid function tests are within normal limits. She will not need intervention for this now.  repeat TFts and p/e in 12 months .   2. Diabetes mellitus without complication ([facility]) Her [id number] is now better at 5.6% from 6.5%. Since she is a light weight individual , she will not need medications. - dietary advice was given to her to avoid simple Carbs.  - I advised patient to maintain close follow up with Tula Nakayama, MD for primary care needs. Follow up plan: Return in about 1 year (around 08/05/2017) for  follow up with pre-visit labs.  Glade Lloyd, MD Phone: [phone]  Fax: [phone]   08/05/2016, 3:08 PM

## 2016-08-12 DIAGNOSIS — H25813 Combined forms of age-related cataract, bilateral: Secondary | ICD-10-CM | POA: Diagnosis not present

## 2016-08-12 DIAGNOSIS — H401131 Primary open-angle glaucoma, bilateral, mild stage: Secondary | ICD-10-CM | POA: Diagnosis not present

## 2016-10-01 ENCOUNTER — Other Ambulatory Visit: Payer: Self-pay | Admitting: Family Medicine

## 2016-10-02 ENCOUNTER — Other Ambulatory Visit: Payer: Self-pay | Admitting: Family Medicine

## 2016-10-02 DIAGNOSIS — Z1231 Encounter for screening mammogram for malignant neoplasm of breast: Secondary | ICD-10-CM

## 2016-10-09 ENCOUNTER — Ambulatory Visit: Payer: Medicare Other | Admitting: Family Medicine

## 2016-10-20 ENCOUNTER — Other Ambulatory Visit: Payer: Self-pay | Admitting: Family Medicine

## 2016-10-20 ENCOUNTER — Telehealth: Payer: Self-pay

## 2016-10-20 MED ORDER — MONTELUKAST SODIUM 10 MG PO TABS: 10.0000 mg | ORAL_TABLET | Freq: Every day | ORAL | 3 refills | Status: DC

## 2016-10-20 NOTE — Telephone Encounter (Signed)
Dr. Moshe Cipro,  Patient came into the office today stating she was having allergy symptoms. Itchy throat, headache, runny nose, sneezing, non productive cough, and sinus pressure since this past Friday. She has only taken OTC tylenol with no relief. She states she has tried OTC allergy medications in the past and they did not work. She is requesting a prescription called in to [facility] for her if possible. Please advise.

## 2016-10-20 NOTE — Telephone Encounter (Signed)
singulair sent in 

## 2016-11-04 ENCOUNTER — Other Ambulatory Visit: Payer: Self-pay | Admitting: Family Medicine

## 2016-11-04 ENCOUNTER — Ambulatory Visit
Admission: RE | Admit: 2016-11-04 | Discharge: 2016-11-04 | Disposition: A | Discharge status: Home / Self Care | Payer: Medicare Other | Source: Ambulatory Visit | Attending: Family Medicine | Admitting: Family Medicine

## 2016-11-04 DIAGNOSIS — E119 Type 2 diabetes mellitus without complications: Secondary | ICD-10-CM | POA: Diagnosis not present

## 2016-11-04 DIAGNOSIS — Z1231 Encounter for screening mammogram for malignant neoplasm of breast: Secondary | ICD-10-CM | POA: Diagnosis not present

## 2016-11-04 DIAGNOSIS — E785 Hyperlipidemia, unspecified: Secondary | ICD-10-CM | POA: Diagnosis not present

## 2016-11-04 DIAGNOSIS — M8580 Other specified disorders of bone density and structure, unspecified site: Secondary | ICD-10-CM | POA: Diagnosis not present

## 2016-11-05 LAB — CBC/DIFF AMBIGUOUS DEFAULT
BASOS: 1 %
Basophils Absolute: 0 10*3/uL (ref 0.0–0.2)
EOS (ABSOLUTE): 0 10*3/uL (ref 0.0–0.4)
EOS: 1 %
HEMATOCRIT: 39.9 % (ref 34.0–46.6)
Hemoglobin: 12.5 g/dL (ref 11.1–15.9)
IMMATURE GRANS (ABS): 0 10*3/uL (ref 0.0–0.1)
IMMATURE GRANULOCYTES: 0 %
LYMPHS: 26 %
Lymphocytes Absolute: 1.8 10*3/uL (ref 0.7–3.1)
MCH: 28 pg (ref 26.6–33.0)
MCHC: 31.3 g/dL — ABNORMAL LOW (ref 31.5–35.7)
MCV: 89 fL (ref 79–97)
Monocytes Absolute: 0.4 10*3/uL (ref 0.1–0.9)
Monocytes: 5 %
NEUTROS PCT: 67 %
Neutrophils Absolute: 4.7 10*3/uL (ref 1.4–7.0)
Platelets: 238 10*3/uL (ref 150–379)
RBC: 4.47 x10E6/uL (ref 3.77–5.28)
RDW: 15.4 % (ref 12.3–15.4)
WBC: 6.9 10*3/uL (ref 3.4–10.8)

## 2016-11-05 LAB — VITAMIN D 25 HYDROXY (VIT D DEFICIENCY, FRACTURES): Vit D, 25-Hydroxy: 49.1 ng/mL (ref 30.0–100.0)

## 2016-11-05 LAB — HEPATIC FUNCTION PANEL
ALBUMIN: 4.2 g/dL (ref 3.6–4.8)
ALT: 15 IU/L (ref 0–32)
AST: 18 IU/L (ref 0–40)
Alkaline Phosphatase: 51 IU/L (ref 39–117)
Bilirubin Total: 0.3 mg/dL (ref 0.0–1.2)
Bilirubin, Direct: 0.1 mg/dL (ref 0.00–0.40)
TOTAL PROTEIN: 6.7 g/dL (ref 6.0–8.5)

## 2016-11-05 LAB — LIPID PANEL W/O CHOL/HDL RATIO
Cholesterol, Total: 174 mg/dL (ref 100–199)
HDL: 66 mg/dL (ref >39)
LDL Calculated: 96 mg/dL (ref 0–99)
Triglycerides: 62 mg/dL (ref 0–149)
VLDL CHOLESTEROL CAL: 12 mg/dL (ref 5–40)

## 2016-11-05 LAB — AMBIG ABBREV LP DEFAULT

## 2016-11-05 LAB — AMBIG ABBREV HFP7 DEFAULT

## 2016-11-12 ENCOUNTER — Encounter: Payer: Self-pay | Admitting: Family Medicine

## 2016-11-12 ENCOUNTER — Ambulatory Visit (INDEPENDENT_AMBULATORY_CARE_PROVIDER_SITE_OTHER): Payer: Medicare Other | Admitting: Family Medicine

## 2016-11-12 VITALS — BP 120/70 | HR 83 | Resp 16 | Ht 67.0 in | Wt 122.0 lb

## 2016-11-12 DIAGNOSIS — E785 Hyperlipidemia, unspecified: Secondary | ICD-10-CM

## 2016-11-12 DIAGNOSIS — M5441 Lumbago with sciatica, right side: Secondary | ICD-10-CM | POA: Diagnosis not present

## 2016-11-12 DIAGNOSIS — F409 Phobic anxiety disorder, unspecified: Secondary | ICD-10-CM

## 2016-11-12 DIAGNOSIS — J302 Other seasonal allergic rhinitis: Secondary | ICD-10-CM

## 2016-11-12 DIAGNOSIS — F411 Generalized anxiety disorder: Secondary | ICD-10-CM | POA: Diagnosis not present

## 2016-11-12 DIAGNOSIS — F5105 Insomnia due to other mental disorder: Secondary | ICD-10-CM

## 2016-11-12 MED ORDER — MONTELUKAST SODIUM 10 MG PO TABS: 10.0000 mg | ORAL_TABLET | Freq: Every day | ORAL | 3 refills | Status: DC

## 2016-11-12 NOTE — Patient Instructions (Addendum)
F/u in 5 months ,call if you need me before  Medication sent  In for allergies  Please go to the [facility]  For exercise calsss at least 5 days per week  Labs are excellent  If back pain and right leg pain persists I suggest you call the orthopedic Doc who operated on your  Knee  Thank you  for choosing [facility]. We consider it a privelige to serve you.  Delivering excellent health care in a caring and  compassionate way is our goal.  Partnering with you,  so that together we can achieve this goal is our strategy.

## 2016-11-16 ENCOUNTER — Encounter: Payer: Self-pay | Admitting: Family Medicine

## 2016-11-16 DIAGNOSIS — M5410 Radiculopathy, site unspecified: Secondary | ICD-10-CM | POA: Insufficient documentation

## 2016-11-16 DIAGNOSIS — M549 Dorsalgia, unspecified: Secondary | ICD-10-CM | POA: Insufficient documentation

## 2016-11-16 NOTE — Assessment & Plan Note (Signed)
Likely radiating from knee, pt to monitor, may call back for ept xray of lumbar spine, my impression is that the pain is originating from her knee, and I encourage her to contact ortho also

## 2016-11-16 NOTE — Assessment & Plan Note (Signed)
Increased symptoms , medication prescribed

## 2016-11-16 NOTE — Assessment & Plan Note (Signed)
Controlled, no change in medication  

## 2016-11-16 NOTE — Progress Notes (Signed)
   Tamara Lam     MRN: [id number]      DOB: 09/13/1950   HPI [name] is here for follow up and re-evaluation of chronic medical conditions, medication management and review of any available recent lab and radiology data.  Preventive health is updated, specifically  Cancer screening and Immunization.   Recovering well from right knee surgery but has recently developed low back pan radiating to right buttock groin and knee with certain movements, lessening in severity, will opt to watch pain and have advised she contact ortho if worsens as x ray of lumbar spine normal in past 2 years The PT denies any adverse reactions to current medications since the last visit.  Increased allergy symptoms  ROS Denies recent fever or chills. Denies sinus pressure,  ear pain or sore throat. Denies chest congestion, productive cough or wheezing. Denies chest pains, palpitations and leg swelling Denies abdominal pain, nausea, vomiting,diarrhea or constipation.   Denies dysuria, frequency, hesitancy or incontinence.  Denies headaches, seizures, numbness, or tingling. Denies depression, anxiety or insomnia. Denies skin break down or rash.   PE  BP 120/70   Pulse 83   Resp 16   Ht 5\' 7"  (1.702 m)   Wt 122 lb (55.3 kg)   SpO2 99%   BMI 19.11 kg/m   Patient alert and oriented and in no cardiopulmonary distress.  HEENT: No facial asymmetry, EOMI,   oropharynx pink and moist.  Neck supple no JVD, no mass.TM clear , nasal mucosa erythematous and edematous Chest: Clear to auscultation bilaterally.  CVS: S1, S2 no murmurs, no S3.Regular rate.  ABD: Soft non tender.   Ext: No edema  MS: Adequate   ROM spine, normal  In shoulders, hips and decreased in right  knee.  Skin: Intact, no ulcerations or rash noted.  Psych: Good eye contact, normal affect. Memory intact not anxious or depressed appearing.  CNS: CN 2-12 intact, power,  normal throughout.no focal deficits noted.   Assessment &  Plan  Hyperlipidemia LDL goal <100 Hyperlipidemia:Low fat diet discussed and encouraged.   Lipid Panel  Lab Results  Component Value Date   CHOL 174 11/04/2016   HDL 66 11/04/2016   LDLCALC 96 11/04/2016   TRIG 62 11/04/2016   CHOLHDL 2.3 05/05/2016   Controlled, no change in medication     Allergic rhinitis Increased symptoms , medication prescribed  Insomnia due to anxiety and fear Sleep hygiene reviewed and written information offered also. Prescription sent for  medication needed.   Anxiety state Controlled, no change in medication   Back pain Likely radiating from knee, pt to monitor, may call back for ept xray of lumbar spine, my impression is that the pain is originating from her knee, and I encourage her to contact ortho also

## 2016-11-16 NOTE — Assessment & Plan Note (Signed)
Sleep hygiene reviewed and written information offered also. Prescription sent for  medication needed.  

## 2016-11-16 NOTE — Assessment & Plan Note (Signed)
Hyperlipidemia:Low fat diet discussed and encouraged.   Lipid Panel  Lab Results  Component Value Date   CHOL 174 11/04/2016   HDL 66 11/04/2016   LDLCALC 96 11/04/2016   TRIG 62 11/04/2016   CHOLHDL 2.3 05/05/2016   Controlled, no change in medication

## 2016-11-17 ENCOUNTER — Telehealth: Payer: Self-pay | Admitting: Family Medicine

## 2016-11-17 ENCOUNTER — Telehealth: Payer: Self-pay | Admitting: Internal Medicine

## 2016-11-17 NOTE — Telephone Encounter (Signed)
Pt was here 5/16. States the back pain is better now but now having pain in her stomach. All over off and on nagging pain. Saw a streak of pink with BM but she does have hemorrhoids. Also some nausea and bloating too. Advised to contact her GI and see what she advises and to call back here if she needed to be seen.

## 2016-11-17 NOTE — Progress Notes (Signed)
This encounter was created in error - please disregard.

## 2016-11-17 NOTE — Telephone Encounter (Signed)
Patient is complaining of back pain, says she has had stomach pain as well, had a bowel movement with blood last Friday. Requesting a call to discuss  Cb#: [phone]

## 2016-11-17 NOTE — Telephone Encounter (Signed)
PLEASE CALL PATIENT, SHE HAS APPT COMING UP BUT SHE IS HAVING ABD PAIN AND WANTS TO SPEAK TO SOMEONE

## 2016-11-18 ENCOUNTER — Other Ambulatory Visit: Payer: Self-pay | Admitting: Family Medicine

## 2016-11-18 DIAGNOSIS — K921 Melena: Secondary | ICD-10-CM

## 2016-11-18 DIAGNOSIS — R110 Nausea: Secondary | ICD-10-CM

## 2016-11-18 DIAGNOSIS — R109 Unspecified abdominal pain: Secondary | ICD-10-CM

## 2016-11-18 NOTE — Telephone Encounter (Signed)
I have referred pt for sooner appt

## 2016-11-18 NOTE — Telephone Encounter (Signed)
Called patient and she is coming Friday morning.

## 2016-11-18 NOTE — Telephone Encounter (Signed)
Looks like Dr.Simpson wants her seen sooner. Can you get her in asap?

## 2016-11-21 ENCOUNTER — Ambulatory Visit (INDEPENDENT_AMBULATORY_CARE_PROVIDER_SITE_OTHER): Payer: Medicare Other | Admitting: Gastroenterology

## 2016-11-21 ENCOUNTER — Encounter: Payer: Self-pay | Admitting: Gastroenterology

## 2016-11-21 VITALS — BP 139/82 | HR 88 | Temp 97.1°F | Ht 67.0 in | Wt 123.8 lb

## 2016-11-21 DIAGNOSIS — R110 Nausea: Secondary | ICD-10-CM | POA: Diagnosis not present

## 2016-11-21 DIAGNOSIS — R1013 Epigastric pain: Secondary | ICD-10-CM | POA: Diagnosis not present

## 2016-11-21 DIAGNOSIS — R300 Dysuria: Secondary | ICD-10-CM

## 2016-11-21 MED ORDER — OMEPRAZOLE 20 MG PO CPDR: 20.0000 mg | DELAYED_RELEASE_CAPSULE | Freq: Every day | ORAL | 1 refills | Status: DC

## 2016-11-21 NOTE — Progress Notes (Signed)
Primary Care Physician: Fayrene Helper, MD  Primary Gastroenterologist:  Garfield Cornea, MD   Chief Complaint  Patient presents with  . Abdominal Pain    x1 week, "nagging hurting"  . Nausea    at times, sometimes after having bm    HPI: Tamara Lam is a 66 y.o. female here for follow-up of one-week history of nagging abdominal pain associated with nausea. She was last seen in February. She has a history of chronic constipation managed with Linzess 227mcg, has had some intermittent nausea evaluated by PCP with negative H. pylori breath test.  Last Monday, left flank/back pain and went away. Wed it was gone. Was unrelated to meals. Saw pcp for routine follow-up. Friday, nagging pain in upper belly like menstrual cycle. Little nausea sporadic. No vomiting. When she has nausea she just waits it off. Sometimes worse after BM especially since she's had some abdominal pain over the past week. She takes Linzess 227mcg as needed. Actually has not required it quite some time up until last couple of days. Denies heartburn or indigestion. Denies dysphagia. Never wakes up with abdominal pain or nausea. She has had some increased frequency with urination. Mild discomfort. Recent blood-tinged mucus per rectum likely hemorrhoidal related. Colonoscopy last year.  She denies NSAIDs. She takes a baby aspirin daily.   Current Outpatient Prescriptions  Medication Sig Dispense Refill  . acetaminophen (TYLENOL) 500 MG tablet Take 500 mg by mouth daily as needed for moderate pain or headache.    . alendronate (FOSAMAX) 70 MG tablet TAKE 1 TABLET ONCE EVERY  WEEK IN THE MORNING 30  MINUTES BEFORE EATING WITH  8 OUNCES OF WATER . SIT UP  FOR 30 MIN AFTER TAKING. 12 tablet 1  . aspirin EC 81 MG tablet Take 81 mg by mouth daily.    . Calcium Carb-Cholecalciferol (CALCIUM 1000 + D) 1000-800 MG-UNIT TABS Take 1 tablet by mouth daily.    . diazepam (VALIUM) 5 MG tablet TAKE 1 TABLET BY MOUTH DAILY AS  NEEDED FOR ANXIETY. 90 tablet 0  . LINZESS 290 MCG CAPS capsule TAKE 1 CAPSULE BY MOUTH  DAILY BEFORE BREAKFAST (Patient taking differently: TAKE 1 CAPSULE BY MOUTH  DAILY BEFORE BREAKFAST. Has been taking as needed) 90 capsule 3  . Multiple Vitamins-Minerals (CENTRUM ADULTS) TABS Take 1 tablet by mouth daily.    . simvastatin (ZOCOR) 20 MG tablet TAKE 1 TABLET BY MOUTH AT  BEDTIME FOR CHOLESTEROL 90 tablet 1  . travoprost, benzalkonium, (TRAVATAN) 0.004 % ophthalmic solution Place 1 drop into both eyes at bedtime.      . vitamin C (ASCORBIC ACID) 500 MG tablet Take 500 mg by mouth daily.    . montelukast (SINGULAIR) 10 MG tablet Take 1 tablet (10 mg total) by mouth at bedtime. (Patient not taking: Reported on 11/21/2016) 30 tablet 3   No current facility-administered medications for this visit.     Allergies as of 11/21/2016  . (No Known Allergies)    ROS:  General: Negative for anorexia, weight loss, fever, chills, fatigue, weakness. ENT: Negative for hoarseness, difficulty swallowing , nasal congestion. CV: Negative for chest pain, angina, palpitations, dyspnea on exertion, peripheral edema.  Respiratory: Negative for dyspnea at rest, dyspnea on exertion, cough, sputum, wheezing.  GI: See history of present illness. GU:  Negative for dysuria, hematuria, urinary incontinence, urinary frequency, nocturnal urination.  Endo: Negative for unusual weight change.    Physical Examination:   BP 139/82  Pulse 88   Temp 97.1 F (36.2 C) (Oral)   Ht 5\' 7"  (1.702 m)   Wt 123 lb 12.8 oz (56.2 kg)   BMI 19.39 kg/m   General: Well-nourished, well-developed in no acute distress.  Eyes: No icterus. Mouth: Oropharyngeal mucosa moist and pink , no lesions erythema or exudate. Lungs: Clear to auscultation bilaterally.  Heart: Regular rate and rhythm, no murmurs rubs or gallops.  Abdomen: Bowel sounds are normal, nontender, nondistended, no hepatosplenomegaly or masses, no abdominal bruits or  hernia , no rebound or guarding.   Extremities: No lower extremity edema. No clubbing or deformities. Neuro: Alert and oriented x 4   Skin: Warm and dry, no jaundice.   Psych: Alert and cooperative, normal mood and affect.  Labs:  Lab Results  Component Value Date   ALT 15 11/04/2016   AST 18 11/04/2016   ALKPHOS 51 11/04/2016   BILITOT 0.3 11/04/2016   Lab Results  Component Value Date   WBC 6.9 11/04/2016   HGB 12.2 07/25/2016   HCT 39.9 11/04/2016   MCV 89 11/04/2016   PLT 238 11/04/2016   Lab Results  Component Value Date   CREATININE 0.68 07/25/2016   BUN 11 07/25/2016   NA 141 07/25/2016   K 4.2 07/25/2016   CL 103 07/25/2016   CO2 32 (H) 07/25/2016   Lab Results  Component Value Date   TSH 0.68 07/22/2016    Imaging Studies: Mm Screening Breast Tomo Bilateral  Result Date: 11/04/2016 CLINICAL DATA:  Screening. EXAM: 2D DIGITAL SCREENING BILATERAL MAMMOGRAM WITH CAD AND ADJUNCT TOMO COMPARISON:  Previous exam(s). ACR Breast Density Category c: The breast tissue is heterogeneously dense, which may obscure small masses. FINDINGS: There are no findings suspicious for malignancy. Images were processed with CAD. IMPRESSION: No mammographic evidence of malignancy. A result letter of this screening mammogram will be mailed directly to the patient. RECOMMENDATION: Screening mammogram in one year. (Code:SM-B-01Y) BI-RADS CATEGORY  1: Negative. Electronically Signed   By: Franki Cabot M.D.   On: 11/04/2016 13:35

## 2016-11-21 NOTE — Patient Instructions (Signed)
1. Start omeprazole once daily before breakfast to see if this helps with nausea and upper abdominal discomfort. Prescription sent to [facility]. 2. Please collect urine specimen and take to the lab today. 3. Call next week and let me know how your are doing.

## 2016-11-22 LAB — URINALYSIS, ROUTINE W REFLEX MICROSCOPIC
Bilirubin, UA: NEGATIVE
GLUCOSE, UA: NEGATIVE
KETONES UA: NEGATIVE
Leukocytes, UA: NEGATIVE
NITRITE UA: NEGATIVE
Protein, UA: NEGATIVE
RBC UA: NEGATIVE
SPEC GRAV UA: 1.015 (ref 1.005–1.030)
UUROB: 0.2 mg/dL (ref 0.2–1.0)
pH, UA: 5.5 (ref 5.0–7.5)

## 2016-11-23 ENCOUNTER — Encounter: Payer: Self-pay | Admitting: Gastroenterology

## 2016-11-23 LAB — URINE CULTURE: ORGANISM ID, BACTERIA: NO GROWTH

## 2016-11-23 NOTE — Assessment & Plan Note (Signed)
66 year old female presenting with one-week history of nagging upper abdominal pain (epigastric/left upper quadrant) abdominal pain initially associated with left flank pain. Flank pain has resolved. She's had some mild urinary discomfort increased frequency. No fever or chills. Associated nausea but no vomiting. Overall she does not appreciate heartburn symptoms. Symptoms somewhat vague. Recent renal function, TSH, CBC, LFTs unremarkable. Check urinalysis with culture. Add PPI therapy. Follow closely with progress report next week. Consider imaging if ongoing symptoms unexplained.

## 2016-11-23 NOTE — Progress Notes (Signed)
Please let patient know her urine was fine. How is she doing?

## 2016-11-25 NOTE — Progress Notes (Signed)
cc'ed to pcp °

## 2016-12-03 DIAGNOSIS — H401131 Primary open-angle glaucoma, bilateral, mild stage: Secondary | ICD-10-CM | POA: Diagnosis not present

## 2016-12-03 DIAGNOSIS — H25813 Combined forms of age-related cataract, bilateral: Secondary | ICD-10-CM | POA: Diagnosis not present

## 2016-12-15 ENCOUNTER — Ambulatory Visit: Payer: Medicare Other | Admitting: Gastroenterology

## 2016-12-26 DIAGNOSIS — L309 Dermatitis, unspecified: Secondary | ICD-10-CM | POA: Diagnosis not present

## 2016-12-26 DIAGNOSIS — L259 Unspecified contact dermatitis, unspecified cause: Secondary | ICD-10-CM | POA: Diagnosis not present

## 2017-01-09 NOTE — Progress Notes (Signed)
Patient needs ov f/u, nonurgent.

## 2017-01-12 ENCOUNTER — Encounter: Payer: Self-pay | Admitting: Gastroenterology

## 2017-01-12 NOTE — Progress Notes (Signed)
APPOINTMENT MADE AND LETTER SENT °

## 2017-01-21 ENCOUNTER — Telehealth: Payer: Self-pay | Admitting: Internal Medicine

## 2017-01-21 ENCOUNTER — Other Ambulatory Visit: Payer: Self-pay | Admitting: Gastroenterology

## 2017-01-21 NOTE — Telephone Encounter (Signed)
Pt wanted to know if [facility] wanted her to continue taking prilosec. She is almost out and will need a refill soon. I told her to have her pharmacy send [place] a refill request and I would send a note to the nurse to see if she needs to continue or not. Please advise. [phone] She uses [facility]

## 2017-01-22 NOTE — Telephone Encounter (Signed)
Tried to call pt- NA- LMOM 

## 2017-03-09 ENCOUNTER — Ambulatory Visit (INDEPENDENT_AMBULATORY_CARE_PROVIDER_SITE_OTHER): Payer: Medicare Other | Admitting: Gastroenterology

## 2017-03-09 ENCOUNTER — Encounter: Payer: Self-pay | Admitting: Gastroenterology

## 2017-03-09 VITALS — BP 121/79 | HR 80 | Temp 98.0°F | Ht 67.0 in | Wt 127.0 lb

## 2017-03-09 DIAGNOSIS — K5909 Other constipation: Secondary | ICD-10-CM

## 2017-03-09 DIAGNOSIS — R110 Nausea: Secondary | ICD-10-CM | POA: Diagnosis not present

## 2017-03-09 NOTE — Progress Notes (Signed)
Primary Care Physician: Fayrene Helper, MD  Primary Gastroenterologist:  Garfield Cornea, MD   Chief Complaint  Patient presents with  . Abdominal Pain    f/u, doing better  . Constipation    Linzess is helping    HPI: Tamara Lam is a 66 y.o. female here for follow-up. Last seen in May. At that time she complained of one-week history of nagging crampy upper abdominal pain associated with nausea. Started after one week of left flank/back pain that resolved on its on. Also with constipation, managed with Linzess 266mcg daily. H. pylori breath test negative in 06/2016. Denied nsaids. ASA 81mg  daily. Started on omeprazole. She had some mild urinary issues so u/a with culture was done and normal. One week later she felt much better.   Here for follow-up today. Appetite good. No further abdominal pain. Little indigestion. nausea improved but still has some. Takes Linzess few times per week with good management of constipation. No melena rectal bleeding. No typical heartburn.   Current Outpatient Prescriptions  Medication Sig Dispense Refill  . acetaminophen (TYLENOL) 500 MG tablet Take 500 mg by mouth daily as needed for moderate pain or headache.    . alendronate (FOSAMAX) 70 MG tablet TAKE 1 TABLET ONCE EVERY  WEEK IN THE MORNING 30  MINUTES BEFORE EATING WITH  8 OUNCES OF WATER . SIT UP  FOR 30 MIN AFTER TAKING. 12 tablet 1  . aspirin EC 81 MG tablet Take 81 mg by mouth daily.    . Calcium Carb-Cholecalciferol (CALCIUM 1000 + D) 1000-800 MG-UNIT TABS Take 1 tablet by mouth daily.    . diazepam (VALIUM) 5 MG tablet TAKE 1 TABLET BY MOUTH DAILY AS NEEDED FOR ANXIETY. 90 tablet 0  . LINZESS 290 MCG CAPS capsule TAKE 1 CAPSULE BY MOUTH  DAILY BEFORE BREAKFAST (Patient taking differently: TAKE 1 CAPSULE BY MOUTH  DAILY BEFORE BREAKFAST. Has been taking as needed) 90 capsule 3  . Multiple Vitamins-Minerals (CENTRUM ADULTS) TABS Take 1 tablet by mouth daily.    Marland Kitchen omeprazole  (PRILOSEC) 20 MG capsule TAKE 1 CAPSULE BY MOUTH ONCE DAILY BEFORE BREAKFAST. 30 capsule 5  . simvastatin (ZOCOR) 20 MG tablet TAKE 1 TABLET BY MOUTH AT  BEDTIME FOR CHOLESTEROL 90 tablet 1  . travoprost, benzalkonium, (TRAVATAN) 0.004 % ophthalmic solution Place 1 drop into both eyes at bedtime.      . vitamin C (ASCORBIC ACID) 500 MG tablet Take 500 mg by mouth daily.     No current facility-administered medications for this visit.     Allergies as of 03/09/2017  . (No Known Allergies)    ROS:  General: Negative for anorexia, weight loss, fever, chills, fatigue, weakness. ENT: Negative for hoarseness, difficulty swallowing , nasal congestion. CV: Negative for chest pain, angina, palpitations, dyspnea on exertion, peripheral edema.  Respiratory: Negative for dyspnea at rest, dyspnea on exertion, cough, sputum, wheezing.  GI: See history of present illness. GU:  Negative for dysuria, hematuria, urinary incontinence, urinary frequency, nocturnal urination.  Endo: Negative for unusual weight change.    Physical Examination:   BP 121/79   Pulse 80   Temp 98 F (36.7 C) (Oral)   Ht 5\' 7"  (1.702 m)   Wt 127 lb (57.6 kg)   BMI 19.89 kg/m   General: Well-nourished, well-developed in no acute distress.  Eyes: No icterus. Mouth: Oropharyngeal mucosa moist and pink , no lesions erythema or exudate. Lungs: Clear to auscultation bilaterally.  Heart:  Regular rate and rhythm, no murmurs rubs or gallops.  Abdomen: Bowel sounds are normal, nontender, nondistended, no hepatosplenomegaly or masses, no abdominal bruits or hernia , no rebound or guarding.   Extremities: No lower extremity edema. No clubbing or deformities. Neuro: Alert and oriented x 4   Skin: Warm and dry, no jaundice.   Psych: Alert and cooperative, normal mood and affect.  Labs:  Lab Results  Component Value Date   CREATININE 0.68 07/25/2016   BUN 11 07/25/2016   NA 141 07/25/2016   K 4.2 07/25/2016   CL 103  07/25/2016   CO2 32 (H) 07/25/2016   Lab Results  Component Value Date   ALT 15 11/04/2016   AST 18 11/04/2016   ALKPHOS 51 11/04/2016   BILITOT 0.3 11/04/2016   Lab Results  Component Value Date   WBC 6.9 11/04/2016   HGB 12.5 11/04/2016   HCT 39.9 11/04/2016   MCV 89 11/04/2016   PLT 238 11/04/2016   Lab Results  Component Value Date   CREATININE 0.68 07/25/2016   BUN 11 07/25/2016   NA 141 07/25/2016   K 4.2 07/25/2016   CL 103 07/25/2016   CO2 32 (H) 07/25/2016    Imaging Studies: No results found.

## 2017-03-09 NOTE — Patient Instructions (Signed)
1. Please call if you have any intermittent or persistent symptoms of nausea or upper abdominal discomfort. Would consider upper endoscopy.  2. We will make a return follow-up in 6 months but if your doing very well but time you will not need to be seen. Call with any questions or concerns.

## 2017-03-09 NOTE — Progress Notes (Signed)
cc'ed to pcp °

## 2017-03-09 NOTE — Assessment & Plan Note (Signed)
Currently doing well on Linzess 252mcg daily. Return office visit in 6 months.

## 2017-03-09 NOTE — Assessment & Plan Note (Signed)
Recent upper abdominal discomfort associated with nausea, lasting for about 2 weeks. Abdominal pain essentially resolved. She continues to have intermittent nausea of unclear etiology. She has been on omeprazole 20 mg daily for about 3 months. Discussed with her today, she continues to have any symptoms of nausea or recurrent abdominal pain, would have a low threshold for an upper endoscopy. Patient voiced understanding to once to monitor for now. If she does well over the next month or 2, she can try coming off omeprazole and see how she does. We'll plan to see her back in 6 months unless she is doing extremely well at that time she can postpone appointment. She'll let us know if she comes off PPI therapy.

## 2017-04-06 DIAGNOSIS — H401131 Primary open-angle glaucoma, bilateral, mild stage: Secondary | ICD-10-CM | POA: Diagnosis not present

## 2017-04-06 DIAGNOSIS — H25813 Combined forms of age-related cataract, bilateral: Secondary | ICD-10-CM | POA: Diagnosis not present

## 2017-04-07 ENCOUNTER — Telehealth: Payer: Self-pay | Admitting: Family Medicine

## 2017-04-07 DIAGNOSIS — E785 Hyperlipidemia, unspecified: Secondary | ICD-10-CM

## 2017-04-07 NOTE — Telephone Encounter (Signed)
She does need fasting lipid and hepatic panel, please send order to [facility]

## 2017-04-07 NOTE — Telephone Encounter (Signed)
Patient has an appt 10-16 @ 1:20. Asking if she needs blood work ordered. Please let her know today.   cb  [phone]

## 2017-04-08 NOTE — Telephone Encounter (Signed)
Patient informed of message below, verbalized understanding.  

## 2017-04-09 DIAGNOSIS — E785 Hyperlipidemia, unspecified: Secondary | ICD-10-CM | POA: Diagnosis not present

## 2017-04-10 LAB — HEPATIC FUNCTION PANEL
ALBUMIN: 4.5 g/dL (ref 3.6–4.8)
ALK PHOS: 44 IU/L (ref 39–117)
ALT: 16 IU/L (ref 0–32)
AST: 24 IU/L (ref 0–40)
BILIRUBIN TOTAL: 0.5 mg/dL (ref 0.0–1.2)
BILIRUBIN, DIRECT: 0.12 mg/dL (ref 0.00–0.40)
Total Protein: 7.2 g/dL (ref 6.0–8.5)

## 2017-04-10 LAB — LIPID PANEL
Chol/HDL Ratio: 2.3 ratio (ref 0.0–4.4)
Cholesterol, Total: 172 mg/dL (ref 100–199)
HDL: 74 mg/dL (ref >39)
LDL Calculated: 79 mg/dL (ref 0–99)
Triglycerides: 94 mg/dL (ref 0–149)
VLDL Cholesterol Cal: 19 mg/dL (ref 5–40)

## 2017-04-14 ENCOUNTER — Encounter: Payer: Self-pay | Admitting: Family Medicine

## 2017-04-14 ENCOUNTER — Ambulatory Visit (INDEPENDENT_AMBULATORY_CARE_PROVIDER_SITE_OTHER): Payer: Medicare Other | Admitting: Family Medicine

## 2017-04-14 VITALS — BP 116/76 | HR 74 | Resp 14 | Ht 67.0 in | Wt 129.0 lb

## 2017-04-14 DIAGNOSIS — Z23 Encounter for immunization: Secondary | ICD-10-CM

## 2017-04-14 DIAGNOSIS — E042 Nontoxic multinodular goiter: Secondary | ICD-10-CM

## 2017-04-14 DIAGNOSIS — M8580 Other specified disorders of bone density and structure, unspecified site: Secondary | ICD-10-CM

## 2017-04-14 DIAGNOSIS — L299 Pruritus, unspecified: Secondary | ICD-10-CM

## 2017-04-14 DIAGNOSIS — F411 Generalized anxiety disorder: Secondary | ICD-10-CM

## 2017-04-14 DIAGNOSIS — E785 Hyperlipidemia, unspecified: Secondary | ICD-10-CM

## 2017-04-14 MED ORDER — HYDROXYZINE HCL 10 MG PO TABS: ORAL_TABLET | ORAL | 2 refills | Status: DC

## 2017-04-14 NOTE — Assessment & Plan Note (Signed)
Hyperlipidemia:Low fat diet discussed and encouraged.   Lipid Panel  Lab Results  Component Value Date   CHOL 172 04/09/2017   HDL 74 04/09/2017   LDLCALC 79 04/09/2017   TRIG 94 04/09/2017   CHOLHDL 2.3 04/09/2017     Controlled, no change in medication

## 2017-04-14 NOTE — Assessment & Plan Note (Signed)
Reports good control with recent pressure reported as 14 will await report from opthalmologist

## 2017-04-14 NOTE — Assessment & Plan Note (Signed)
Intermittent itching on crown of scal, requests refill of hydroxyzine of hydroxyzine at bedtime

## 2017-04-14 NOTE — Assessment & Plan Note (Signed)
Treated with fosamax and currently followed by endocrinology

## 2017-04-14 NOTE — Progress Notes (Signed)
   Tamara Lam     MRN: [id number]      DOB: February 18, 1951   HPI Tamara Lam is here for follow up and re-evaluation of chronic medical conditions, medication management and review of any available recent lab and radiology data.  Preventive health is updated, specifically  Cancer screening and Immunization.   Questions or concerns regarding consultations or procedures which the PT has had in the interim are  addressed. The PT denies any adverse reactions to current medications since the last visit.  Requests medication for itching for as needed use, has used hydroxyzine in the past with good effect, states e itch would be on her scalp the crown of her head where she had balding in the past ROS Denies recent fever or chills. Denies sinus pressure, nasal congestion, ear pain or sore throat. Denies chest congestion, productive cough or wheezing. Denies chest pains, palpitations and leg swelling Denies abdominal pain, nausea, vomiting,diarrhea or constipation.   Denies dysuria, frequency, hesitancy or incontinence. Denies joint pain, swelling and limitation in mobility. Denies headaches, seizures, numbness, or tingling. Denies depression, anxiety or insomnia. Denies skin break down or rash.   PE  BP 116/76   Pulse 74   Resp 14   Ht 5\' 7"  (1.702 m)   Wt 129 lb (58.5 kg)   BMI 20.20 kg/m   Patient alert and oriented and in no cardiopulmonary distress.  HEENT: No facial asymmetry, EOMI,   oropharynx pink and moist.  Neck supple no JVD, no mass.  Chest: Clear to auscultation bilaterally.  CVS: S1, S2 no murmurs, no S3.Regular rate.  ABD: Soft non tender.   Ext: No edema  MS: Adequate ROM spine, shoulders, hips and knees.  Skin: Intact, no ulcerations or rash noted.  Psych: Good eye contact, normal affect. Memory intact not anxious or depressed appearing.  CNS: CN 2-12 intact, power,  normal throughout.no focal deficits noted.   Assessment & Plan  Pruritus Intermittent  itching on crown of scal, requests refill of hydroxyzine of hydroxyzine at bedtime  Hyperlipidemia LDL goal <100 Hyperlipidemia:Low fat diet discussed and encouraged.   Lipid Panel  Lab Results  Component Value Date   CHOL 172 04/09/2017   HDL 74 04/09/2017   LDLCALC 79 04/09/2017   TRIG 94 04/09/2017   CHOLHDL 2.3 04/09/2017     Controlled, no change in medication   Anxiety state improved , uses half valium  On average 2 to 3 times per week  Glaucoma Reports good control with recent pressure reported as 14 will await report from opthalmologist  Nontoxic multinodular goiter Followed by endocrinology , on no medication  Osteopenia Treated with fosamax and currently followed by endocrinology

## 2017-04-14 NOTE — Assessment & Plan Note (Signed)
Followed by endocrinology , on no medication

## 2017-04-14 NOTE — Patient Instructions (Addendum)
Annual physical exam with MD after November 14 or after   Flu vaccine today  Labs are excellent   It is important that you exercise regularly at least 30 minutes 5 times a week. If you develop chest pain, have severe difficulty breathing, or feel very tired, stop exercising immediately and seek medical attention    Thank you  for choosing [facility]. We consider it a privelige to serve you.  Delivering excellent health care in a caring and  compassionate way is our goal.  Partnering with you,  so that together we can achieve this goal is our strategy.

## 2017-04-14 NOTE — Assessment & Plan Note (Signed)
improved , uses half valium  On average 2 to 3 times per week

## 2017-05-09 ENCOUNTER — Other Ambulatory Visit: Payer: Self-pay | Admitting: Family Medicine

## 2017-05-14 ENCOUNTER — Other Ambulatory Visit: Payer: Self-pay | Admitting: Family Medicine

## 2017-05-14 NOTE — Telephone Encounter (Signed)
Seen 10 16 18 

## 2017-06-03 ENCOUNTER — Encounter: Payer: Self-pay | Admitting: Family Medicine

## 2017-06-04 DIAGNOSIS — M25561 Pain in right knee: Secondary | ICD-10-CM | POA: Diagnosis not present

## 2017-06-04 DIAGNOSIS — G8929 Other chronic pain: Secondary | ICD-10-CM | POA: Diagnosis not present

## 2017-06-04 DIAGNOSIS — Z471 Aftercare following joint replacement surgery: Secondary | ICD-10-CM | POA: Diagnosis not present

## 2017-06-04 DIAGNOSIS — Z96651 Presence of right artificial knee joint: Secondary | ICD-10-CM | POA: Diagnosis not present

## 2017-06-10 ENCOUNTER — Ambulatory Visit (INDEPENDENT_AMBULATORY_CARE_PROVIDER_SITE_OTHER): Payer: Medicare Other | Admitting: Family Medicine

## 2017-06-10 ENCOUNTER — Encounter: Payer: Self-pay | Admitting: Family Medicine

## 2017-06-10 VITALS — BP 120/78 | HR 87 | Resp 16 | Ht 67.0 in | Wt 131.0 lb

## 2017-06-10 DIAGNOSIS — Z0000 Encounter for general adult medical examination without abnormal findings: Secondary | ICD-10-CM

## 2017-06-10 DIAGNOSIS — Z1211 Encounter for screening for malignant neoplasm of colon: Secondary | ICD-10-CM | POA: Diagnosis not present

## 2017-06-10 LAB — POC HEMOCCULT BLD/STL (OFFICE/1-CARD/DIAGNOSTIC): Fecal Occult Blood, POC: NEGATIVE

## 2017-06-10 MED ORDER — DIAZEPAM 5 MG PO TABS: ORAL_TABLET | ORAL | 0 refills | Status: DC

## 2017-06-10 NOTE — Patient Instructions (Addendum)
Wellness visit with nurseJan 24 or after.  MD follow up in 6 months  Valium script is sent In   Good exam, no med changes  Thanks for your patience  All the best for 2019

## 2017-06-11 ENCOUNTER — Encounter: Payer: Self-pay | Admitting: Family Medicine

## 2017-06-11 NOTE — Progress Notes (Signed)
    Tamara Lam     MRN: [id number]      DOB: February 22, 1951  HPI: Patient is in for annual physical exam. No other health concerns are expressed or addressed at the visit. Recent labs, if available are reviewed. Immunization is reviewed , and  Is up to date   PE: BP 120/78   Pulse 87   Resp 16   Ht 5\' 7"  (1.702 m)   Wt 131 lb (59.4 kg)   SpO2 96%   BMI 20.52 kg/m   Pleasant  female, alert and oriented x 3, in no cardio-pulmonary distress. Afebrile. HEENT No facial trauma or asymetry. Sinuses non tender.  Extra occullar muscles intact, pupils equally reactive to light. External ears normal, tympanic membranes clear. Oropharynx moist, no exudate. Neck: supple, no adenopathy,JVD or thyromegaly.No bruits.  Chest: Clear to ascultation bilaterally.No crackles or wheezes. Non tender to palpation  Breast: No asymetry,no masses or lumps. No tenderness. No nipple discharge or inversion. No axillary or supraclavicular adenopathy  Cardiovascular system; Heart sounds normal,  S1 and  S2 ,no S3.  No murmur, or thrill. Apical beat not displaced Peripheral pulses normal.  Abdomen: Soft, non tender, no organomegaly or masses. No bruits. Bowel sounds normal. No guarding, tenderness or rebound.  Rectal:  Normal sphincter tone. No rectal mass. Guaiac negative stool.  GU: Not examined  Musculoskeletal exam: Decreased though adequate ROM of spine, hips , shoulders and knees. No deformity ,swelling or crepitus noted. No muscle wasting or atrophy.   Neurologic: Cranial nerves 2 to 12 intact. Power, tone ,sensation and reflexes normal throughout. No disturbance in gait. No tremor.  Skin: Intact, no ulceration, erythema , scaling or rash noted. Pigmentation normal throughout  Psych; Normal mood and affect. Judgement and concentration normal   Assessment & Plan:  Annual physical exam Annual exam as documented. Counseling done  re healthy lifestyle involving commitment  to 150 minutes exercise per week, heart healthy diet, and attaining healthy weight.The importance of adequate sleep also discussed. Regular seat belt use and home safety, is also discussed. Changes in health habits are decided on by the patient with goals and time frames  set for achieving them. Immunization and cancer screening needs are specifically addressed at this visit.

## 2017-06-11 NOTE — Assessment & Plan Note (Signed)

## 2017-06-16 ENCOUNTER — Telehealth: Payer: Self-pay | Admitting: Family Medicine

## 2017-06-16 ENCOUNTER — Other Ambulatory Visit: Payer: Self-pay

## 2017-06-16 MED ORDER — DIAZEPAM 5 MG PO TABS: ORAL_TABLET | ORAL | 1 refills | Status: DC

## 2017-06-16 NOTE — Telephone Encounter (Signed)
Diazapam is over the limit if the pt takes in half dosage ..[phone]

## 2017-06-16 NOTE — Telephone Encounter (Signed)
Changed quantity to #15 per pharmacy

## 2017-07-06 ENCOUNTER — Telehealth: Payer: Self-pay

## 2017-07-06 ENCOUNTER — Other Ambulatory Visit: Payer: Self-pay | Admitting: Family Medicine

## 2017-07-06 MED ORDER — DIAZEPAM 5 MG PO TABS: ORAL_TABLET | ORAL | 0 refills | Status: DC

## 2017-07-06 NOTE — Telephone Encounter (Signed)
I tried to cal and explain the problem to pt , no answer, I will prescribe 15 tabs total take once daily as needed for anxiety. This normally lasts 6 months. I will not write this on her script, She is to use it as she normally does, please explain this to her

## 2017-07-06 NOTE — Progress Notes (Signed)
See phone note

## 2017-07-06 NOTE — Telephone Encounter (Signed)
There are 2 different valium directions on her list and they will not fill the half a tab once weekly for #15 pills, states it too many for a 30 or 90 day supply

## 2017-07-07 ENCOUNTER — Encounter: Payer: Self-pay | Admitting: Internal Medicine

## 2017-07-07 NOTE — Telephone Encounter (Signed)
Message left for patient with the dosing change and how she should take it instead of how the rx reads

## 2017-07-16 ENCOUNTER — Telehealth: Payer: Self-pay | Admitting: Family Medicine

## 2017-07-16 ENCOUNTER — Other Ambulatory Visit: Payer: Self-pay | Admitting: Family Medicine

## 2017-07-16 MED ORDER — PREDNISONE 5 MG PO TABS: 5.0000 mg | ORAL_TABLET | Freq: Two times a day (BID) | ORAL | 0 refills | Status: AC

## 2017-07-16 MED ORDER — BENZONATATE 100 MG PO CAPS
100.0000 mg | ORAL_CAPSULE | Freq: Two times a day (BID) | ORAL | 0 refills | Status: DC | PRN
Start: 2017-07-16 — End: 2017-08-24

## 2017-07-16 NOTE — Telephone Encounter (Signed)
Patient thinks she has the flu;  Chills, chest congestion, coughing, sneezing, headache, some pain in her body but not a lot  She started feeling bad Monday 07/13/17 nothing over the counter has helped (nyquil, mucinex )  Cb#: [phone]

## 2017-07-16 NOTE — Telephone Encounter (Signed)
Please advise 

## 2017-07-16 NOTE — Telephone Encounter (Signed)
I spoke with the pt, she is to stop the oTC meds, and she will use tessalon perles and prednisone short term, she is aware that she is to seek med attention if she develops fever or deteriorates

## 2017-07-17 ENCOUNTER — Ambulatory Visit: Payer: Medicare Other | Admitting: Family Medicine

## 2017-07-21 ENCOUNTER — Telehealth: Payer: Self-pay | Admitting: Family Medicine

## 2017-07-21 NOTE — Telephone Encounter (Signed)
Patient is calling to let you know she is feeling better and her cough has faded away however the congestion and headache is still lingering around. Cb#: [phone] Pharmacy: [facility]

## 2017-07-24 NOTE — Telephone Encounter (Signed)
Patient aware to try claritin and call back next week if still feeling bad

## 2017-07-28 ENCOUNTER — Ambulatory Visit (HOSPITAL_COMMUNITY)
Admission: RE | Admit: 2017-07-28 | Discharge: 2017-07-28 | Disposition: A | Discharge status: Home / Self Care | Payer: Medicare Other | Source: Ambulatory Visit | Attending: Family Medicine | Admitting: Family Medicine

## 2017-07-28 ENCOUNTER — Other Ambulatory Visit: Payer: Self-pay

## 2017-07-28 ENCOUNTER — Ambulatory Visit: Payer: Medicare Other | Admitting: Family Medicine

## 2017-07-28 ENCOUNTER — Encounter: Payer: Self-pay | Admitting: Family Medicine

## 2017-07-28 VITALS — BP 120/80 | HR 100 | Temp 98.2°F | Resp 16 | Ht 67.0 in | Wt 130.2 lb

## 2017-07-28 DIAGNOSIS — J40 Bronchitis, not specified as acute or chronic: Secondary | ICD-10-CM

## 2017-07-28 DIAGNOSIS — J329 Chronic sinusitis, unspecified: Secondary | ICD-10-CM | POA: Diagnosis not present

## 2017-07-28 DIAGNOSIS — R05 Cough: Secondary | ICD-10-CM | POA: Insufficient documentation

## 2017-07-28 DIAGNOSIS — J0110 Acute frontal sinusitis, unspecified: Secondary | ICD-10-CM

## 2017-07-28 DIAGNOSIS — R6883 Chills (without fever): Secondary | ICD-10-CM | POA: Insufficient documentation

## 2017-07-28 DIAGNOSIS — E785 Hyperlipidemia, unspecified: Secondary | ICD-10-CM

## 2017-07-28 DIAGNOSIS — J0120 Acute ethmoidal sinusitis, unspecified: Secondary | ICD-10-CM | POA: Diagnosis not present

## 2017-07-28 DIAGNOSIS — F411 Generalized anxiety disorder: Secondary | ICD-10-CM

## 2017-07-28 MED ORDER — PENICILLIN V POTASSIUM 500 MG PO TABS: 500.0000 mg | ORAL_TABLET | Freq: Three times a day (TID) | ORAL | 0 refills | Status: DC

## 2017-07-28 NOTE — Patient Instructions (Addendum)
F/u as before, ca;ll if you need me sooner  You have a 10 day course of penicillin;lin prescribed at [facility], pls start today  You are being treated for sinusitis and bronchitis  Please get blood work and X rays today, I will call you tomorrow with the results of both

## 2017-07-28 NOTE — Progress Notes (Signed)
   Tamara Lam     MRN: [id number]      DOB: 1951-02-06   HPI Ms. Mccully is here stating that since Jan 13, she has deteriorated, took [employer], and prednisone, now has no energy, has frontal pressure, , head  Is in a barrel and also poor appetite and some nausea States not much drainage from the nose but did get green nasal drainage from right nostril when she cleaned her  Nose this weekend, also pain on left lower rib cage with  Deep breathing at times, has intermittent cough  At times she reports loose stool when she eats ROS Denies chest pains, palpitations and leg swelling Denies abdominal pain, , vomiting,diarrhea or constipation.   Denies dysuria, frequency, hesitancy or incontinence. Denies joint pain, swelling and limitation in mobility. Denies headaches, seizures, numbness, or tingling. Denies depression, anxiety or insomnia. Denies skin break down or rash.   PE  BP 120/80   Pulse 100   Temp 98.2 F (36.8 C) (Temporal)   Resp 16   Ht 5\' 7"  (1.702 m)   Wt 130 lb 4 oz (59.1 kg)   SpO2 99%   BMI 20.40 kg/m     Patient alert and oriented and in no cardiopulmonary distress.Ill appearing and anxious  HEENT: No facial asymmetry, EOMI,   oropharynx pink and moist.  Neck supple no JVD, no mass, .ethmoid sinus tenderness, TM clear  Chest: Adequate air entry, reduced in right lung base, no wheezes few crackles  CVS: S1, S2 no murmurs, no S3.Regular rate.  ABD: Soft non tender.   Ext: No edema  MS: Adequate ROM spine, shoulders, hips and knees.  Skin: Intact, no ulcerations or rash noted.  Psych: Good eye contact, normal affect. Memory intact not anxious or depressed appearing.  CNS: CN 2-12 intact, power,  normal throughout.no focal deficits noted.   Assessment & Plan  Sinusitis Antibiotic course prescribed, and she is to have an X ray of her sinus  Bronchitis 10 day course of penicillin prescribed , pt to get cXR today also labs  Anxiety  state Increased, pt ill and states her condition is deteriorating  Hyperlipidemia LDL goal <100 Controlled, no change in medication

## 2017-07-29 ENCOUNTER — Encounter: Payer: Self-pay | Admitting: Family Medicine

## 2017-07-29 LAB — CBC
HEMOGLOBIN: 13.1 g/dL (ref 11.1–15.9)
Hematocrit: 40.5 % (ref 34.0–46.6)
MCH: 28.1 pg (ref 26.6–33.0)
MCHC: 32.3 g/dL (ref 31.5–35.7)
MCV: 87 fL (ref 79–97)
PLATELETS: 248 10*3/uL (ref 150–379)
RBC: 4.67 x10E6/uL (ref 3.77–5.28)
RDW: 14.3 % (ref 12.3–15.4)
WBC: 8.4 10*3/uL (ref 3.4–10.8)

## 2017-07-29 LAB — BASIC METABOLIC PANEL
BUN/Creatinine Ratio: 18 (ref 12–28)
BUN: 12 mg/dL (ref 8–27)
CALCIUM: 9.7 mg/dL (ref 8.7–10.3)
CHLORIDE: 102 mmol/L (ref 96–106)
CO2: 25 mmol/L (ref 20–29)
Creatinine, Ser: 0.68 mg/dL (ref 0.57–1.00)
GFR calc Af Amer: 105 mL/min/{1.73_m2} (ref >59)
GFR, EST NON AFRICAN AMERICAN: 91 mL/min/{1.73_m2} (ref >59)
GLUCOSE: 95 mg/dL (ref 65–99)
Potassium: 4 mmol/L (ref 3.5–5.2)
SODIUM: 143 mmol/L (ref 134–144)

## 2017-07-29 NOTE — Assessment & Plan Note (Signed)
10 day course of penicillin prescribed , pt to get cXR today also labs

## 2017-07-29 NOTE — Assessment & Plan Note (Addendum)
Antibiotic course prescribed, and she is to have an X ray of her sinus

## 2017-07-29 NOTE — Assessment & Plan Note (Signed)
Controlled, no change in medication  

## 2017-07-29 NOTE — Assessment & Plan Note (Signed)
Increased, pt ill and states her condition is deteriorating

## 2017-08-03 ENCOUNTER — Ambulatory Visit: Payer: Medicare Other

## 2017-08-04 ENCOUNTER — Other Ambulatory Visit: Payer: Self-pay | Admitting: "Endocrinology

## 2017-08-04 DIAGNOSIS — E042 Nontoxic multinodular goiter: Secondary | ICD-10-CM

## 2017-08-05 ENCOUNTER — Ambulatory Visit: Payer: Medicare Other

## 2017-08-05 ENCOUNTER — Ambulatory Visit: Payer: Medicare Other | Admitting: "Endocrinology

## 2017-08-07 DIAGNOSIS — H25813 Combined forms of age-related cataract, bilateral: Secondary | ICD-10-CM | POA: Diagnosis not present

## 2017-08-07 DIAGNOSIS — H401131 Primary open-angle glaucoma, bilateral, mild stage: Secondary | ICD-10-CM | POA: Diagnosis not present

## 2017-08-15 DIAGNOSIS — E042 Nontoxic multinodular goiter: Secondary | ICD-10-CM | POA: Diagnosis not present

## 2017-08-16 LAB — T4, FREE: FREE T4: 1.24 ng/dL (ref 0.82–1.77)

## 2017-08-16 LAB — TSH: TSH: 0.712 u[IU]/mL (ref 0.450–4.500)

## 2017-08-24 ENCOUNTER — Ambulatory Visit (INDEPENDENT_AMBULATORY_CARE_PROVIDER_SITE_OTHER): Payer: Medicare Other | Admitting: "Endocrinology

## 2017-08-24 ENCOUNTER — Encounter: Payer: Self-pay | Admitting: "Endocrinology

## 2017-08-24 VITALS — BP 123/79 | HR 75 | Ht 67.0 in | Wt 130.0 lb

## 2017-08-24 DIAGNOSIS — E042 Nontoxic multinodular goiter: Secondary | ICD-10-CM

## 2017-08-24 DIAGNOSIS — E119 Type 2 diabetes mellitus without complications: Secondary | ICD-10-CM | POA: Diagnosis not present

## 2017-08-24 NOTE — Progress Notes (Signed)
Subjective:    Patient ID: Tamara Lam, female    DOB: 12-30-1950, PCP Fayrene Helper, MD   Past Medical History:  Diagnosis Date  . Abnormal CXR (chest x-ray)    Evaluated with Pulmonary- Dr. Luan Pulling- "pt. was told probably due to thin body frame"-saw no issues-PFT test normal.  . Anxiety 1980's   . Arthritis    osteoarthritis -knees, hands  . Chronic constipation    tx. Linzess  . Chronic nausea   . Diabetes mellitus without complication ([place])    N8G level checked borderline x1, then further evaluated -Diabetes ruled out.  . Glaucoma 2004   Dr. Venetia Maxon, laser to left eye Sept 2011, bilateral eye drops for this  . Helicobacter pylori gastritis 04/2004   last EGD   . Hematochezia   . Hx of colonoscopy 08/2005   Dr. Gala Romney / hemorrhoids   . Thyroid disease    thyroid goiter, nodules-no problems.  . Weight loss    Past Surgical History:  Procedure Laterality Date  . ABDOMINAL HYSTERECTOMY    . arthoscopic surgery on right knee  2007  . CHOLECYSTECTOMY  1990's  . COLONOSCOPY  2011   Dr. Gala Romney: anal canal/internal hemorrhoids, dimintuive rectosigmoid polyp (polypoid rectal mucosa), few sigmoid diverticula   . COLONOSCOPY N/A 10/30/2015   Dr. Gala Romney: diverticulosis, hemorrhoids, surveillance in 2022   . TOTAL KNEE ARTHROPLASTY Right 05/26/2016   Procedure: RIGHT TOTAL KNEE ARTHROPLASTY;  Surgeon: Gaynelle Arabian, MD;  Location: [facility];  Service: Orthopedics;  Laterality: Right;  Marland Kitchen VESICOVAGINAL FISTULA CLOSURE W/ TAH  1989   Social History   Socioeconomic History  . Marital status: Widowed    Spouse name: None  . Number of children: None  . Years of education: None  . Highest education level: None  Social Needs  . Financial resource strain: None  . Food insecurity - worry: None  . Food insecurity - inability: None  . Transportation needs - medical: None  . Transportation needs - non-medical: None  Occupational History  . Occupation: homemaker     Employer:  UNEMPLOYED  Tobacco Use  . Smoking status: Never Smoker  . Smokeless tobacco: Never Used  . Tobacco comment: Never smoked  Substance and Sexual Activity  . Alcohol use: No    Alcohol/week: 0.0 oz  . Drug use: No  . Sexual activity: Not Currently  Other Topics Concern  . None  Social History Narrative  . None   Outpatient Encounter Medications as of 08/24/2017  Medication Sig  . alendronate (FOSAMAX) 70 MG tablet TAKE 1 TABLET ONCE EVERY  WEEK IN THE MORNING 30  MINUTES BEFORE EATING WITH  8 OUNCES OF WATER . SIT UP  FOR 30 MIN AFTER TAKING.  Marland Kitchen aspirin EC 81 MG tablet Take 81 mg by mouth daily.  . Calcium Carb-Cholecalciferol (CALCIUM 1000 + D) 1000-800 MG-UNIT TABS Take 1 tablet by mouth daily.  . diazepam (VALIUM) 5 MG tablet One tablet once daily as needed , for anxiety  . hydrOXYzine (ATARAX/VISTARIL) 10 MG tablet One tablet at bedtime as needed, for itch  . Multiple Vitamins-Minerals (CENTRUM ADULTS) TABS Take 1 tablet by mouth daily.  Marland Kitchen omeprazole (PRILOSEC) 20 MG capsule TAKE 1 CAPSULE BY MOUTH ONCE DAILY BEFORE BREAKFAST.  Marland Kitchen simvastatin (ZOCOR) 20 MG tablet TAKE 1 TABLET BY MOUTH AT  BEDTIME FOR CHOLESTEROL  . travoprost, benzalkonium, (TRAVATAN) 0.004 % ophthalmic solution Place 1 drop into both eyes at bedtime.    . vitamin  C (ASCORBIC ACID) 500 MG tablet Take 500 mg by mouth daily.  Marland Kitchen LINZESS 290 MCG CAPS capsule TAKE 1 CAPSULE BY MOUTH  DAILY BEFORE BREAKFAST  . [DISCONTINUED] acetaminophen (TYLENOL) 500 MG tablet Take 500 mg by mouth daily as needed for moderate pain or headache.  . [DISCONTINUED] benzonatate (TESSALON) 100 MG capsule Take 1 capsule (100 mg total) by mouth 2 (two) times daily as needed for cough.  . [DISCONTINUED] penicillin v potassium (VEETID) 500 MG tablet Take 1 tablet (500 mg total) by mouth 3 (three) times daily.   No facility-administered encounter medications on file as of 08/24/2017.    ALLERGIES: No Known Allergies VACCINATION  STATUS: Immunization History  Administered Date(s) Administered  . Influenza Whole 03/13/2010, 03/12/2011  . Influenza,inj,Quad PF,6+ Mos 04/24/2013, 02/20/2014, 03/27/2015, 04/28/2016, 04/14/2017  . Pneumococcal Conjugate-13 06/20/2014  . Pneumococcal Polysaccharide-23 05/12/2016  . Td 08/16/2009  . Zoster 02/17/2011    HPI  67 yr old female with medical hx as follows. She is here to follow up for her euthyroid MNG and type 2 DM. She is s/p FNA of thyroid nodule which was benign in 2013.   -Her subsequent surveillance thyroid ultrasound studies showed steady bilateral thyroid nodules, stable in size, last ultrasound was in 2017.    -She is not on any antithyroid medications nor on thyroid hormone supplements. Her new TFTs are WNL. Her a1c is better at 5.6%.  She denies heat/cold intolerance.  She denies any family hx of thyroid dysfunction nor thyroid cancer. she denies exposure to neck radiation. No dysphagia , nor SOB. she has steady weight.  Review of Systems  Constitutional: + Gained 10+ pounds since last visit, a good development for her.  - fatigue, no subjective hyperthermia/hypothermia Eyes: no blurry vision, no xerophthalmia ENT: no sore throat, no nodules palpated in throat, no dysphagia/odynophagia. Cardiovascular: No chest pain, no shortness of breath, no palpitations.   Respiratory: no cough/SOB Gastrointestinal: no N/V/D/C Musculoskeletal: no muscle/joint aches Skin: no rashes Neurological: no tremors/numbness/tingling/dizziness Psychiatric: no depression/anxiety   Objective:    BP 123/79   Pulse 75   Ht 5\' 7"  (1.702 m)   Wt 130 lb (59 kg)   BMI 20.36 kg/m   Wt Readings from Last 3 Encounters:  08/24/17 130 lb (59 kg)  07/28/17 130 lb 4 oz (59.1 kg)  06/10/17 131 lb (59.4 kg)    Physical Exam Constitutional: Not in acute distress, stable state of mind.   Eyes: PERRLA, EOMI, no exophthalmos ENT: moist mucous membranes, + thyromegaly, no cervical  lymphadenopathy Cardiovascular: RRR, No MRG Respiratory: CTA B Gastrointestinal: abdomen soft, NT, ND, BS+ Musculoskeletal: no deformities, strength intact in all 4 Skin: moist, warm, no rashes Neurological: no tremor with outstretched hands, DTR normal in all 4  CMP ( most recent) CMP     Component Value Date/Time   NA 143 07/28/2017 1656   K 4.0 07/28/2017 1656   CL 102 07/28/2017 1656   CO2 25 07/28/2017 1656   GLUCOSE 95 07/28/2017 1656   GLUCOSE 82 07/25/2016 1451   BUN 12 07/28/2017 1656   CREATININE 0.68 07/28/2017 1656   CREATININE 0.68 07/25/2016 1451   CALCIUM 9.7 07/28/2017 1656   PROT 7.2 04/09/2017 1019   ALBUMIN 4.5 04/09/2017 1019   AST 24 04/09/2017 1019   ALT 16 04/09/2017 1019   ALKPHOS 44 04/09/2017 1019   BILITOT 0.5 04/09/2017 1019   GFRNONAA 91 07/28/2017 1656   GFRNONAA >89 05/05/2016 1012   GFRAA 105 07/28/2017  [place] 05/05/2016 1012     Diabetic Labs (most recent): Lab Results  Component Value Date   HGBA1C 5.6 05/05/2016   HGBA1C 5.9 12/20/2014     Lipid Panel ( most recent) Lipid Panel     Component Value Date/Time   CHOL 172 04/09/2017 1019   TRIG 94 04/09/2017 1019   HDL 74 04/09/2017 1019   CHOLHDL 2.3 04/09/2017 1019   CHOLHDL 2.3 05/05/2016 1012   VLDL 13 05/05/2016 1012   LDLCALC 79 04/09/2017 1019    Results for AUGUSTINA, BRADDOCK (MRN [id number]) as of 08/24/2017 15:20  Ref. Range 08/15/2017 10:40  TSH Latest Ref Range: 0.450 - 4.500 uIU/mL 0.712  T4,Free(Direct) Latest Ref Range: 0.82 - 1.77 ng/dL 1.24    Thyroid ultrasound from 08/01/2016  IMPRESSION: 1. The previously biopsied dominant approximately 2.1 cm nodule within the right lobe of the thyroid is grossly unchanged compared to the 09/2011 examination. Correlation prior biopsy results is recommended. 2. None of the additional discretely measured thyroid nodules, the majority of which appear either spongiform or cystic in composition, meet imaging  criteria to recommend percutaneous sampling or dedicated follow-up.    06/21/2015: Thyroid function test within normal limits, A1c 5.9%.   Assessment & Plan:   1. Nontoxic multinodular goiter -She is status post fine-needle aspiration of a nodule in the right lobe of her thyroid with benign outcomes.  Her thyroid function tests continue to be within normal limits.  She would not require intervention with thyroid hormone at this time.  She will have repeat thyroid ultrasound before her next visit in 1 year.  2. Diabetes mellitus without complication ([place]) -She does not have recent A1c measurement.  She has not on any medications for prediabetes/diabetes.    -She will have repeat labs with A1c before next visit.   -  Suggestion is made for her to avoid simple carbohydrates  from her diet including Cakes, Sweet Desserts / Pastries, Ice Cream, Soda (diet and regular), Sweet Tea, Candies, Chips, Cookies, Store Bought Juices, Alcohol in Excess of  1-2 drinks a day, Artificial Sweeteners, and "Sugar-free" Products. This will help patient to have stable blood glucose profile and potentially avoid unintended weight gain.   - I advised patient to maintain close follow up with Fayrene Helper, MD for primary care needs. Follow up plan: Return in about 1 year (around 08/24/2018) for follow up with pre-visit labs.  Glade Lloyd, MD Phone: [phone]  Fax: [phone]  -  This note was partially dictated with voice recognition software. Similar sounding words can be transcribed inadequately or may not  be corrected upon review.  08/24/2017, 2:13 PM

## 2017-09-09 ENCOUNTER — Encounter: Payer: Self-pay | Admitting: Gastroenterology

## 2017-09-09 ENCOUNTER — Ambulatory Visit: Payer: Medicare Other | Admitting: Gastroenterology

## 2017-09-09 VITALS — BP 140/83 | HR 91 | Temp 97.7°F | Ht 67.0 in | Wt 127.2 lb

## 2017-09-09 DIAGNOSIS — R140 Abdominal distension (gaseous): Secondary | ICD-10-CM | POA: Insufficient documentation

## 2017-09-09 DIAGNOSIS — K5909 Other constipation: Secondary | ICD-10-CM

## 2017-09-09 DIAGNOSIS — R110 Nausea: Secondary | ICD-10-CM

## 2017-09-09 MED ORDER — PANTOPRAZOLE SODIUM 40 MG PO TBEC: 40.0000 mg | DELAYED_RELEASE_TABLET | Freq: Every day | ORAL | 5 refills | Status: DC

## 2017-09-09 NOTE — Progress Notes (Signed)
Primary Care Physician: Fayrene Helper, MD  Primary Gastroenterologist:  Garfield Cornea, MD   Chief Complaint  Patient presents with  . Constipation    f/u, Linzess helping    HPI: Tamara Lam is a 67 y.o. female here follow-up. Last seen in September. Here to f/u constipation.   Flu like symptoms for 3 weeks back in 06/2017. Stomach upset with more frequent stools and somewhat more loose. Some pain in sides and into the stomach. Worse with bending. PCP, prednisone and cough medication. URI symptoms improved. Then developed sinus pressure. 10 days of antibiotics. Started feeling better.   Good appetite through it all. Some increased acid reflux during that time. Woke up with feeling like regurgitation but helped by getting upright. Drank some Pepsi and felt better. Happened only during that illness. She does not tolerate barbeque sauce, tomato sauce, meatloaf, sweet tea. Doesn't settle well. Some intermittent nausea. No vomiting. At least once weekly.   BM ok. Doesn't take Linzess daily.   Current Outpatient Medications  Medication Sig Dispense Refill  . alendronate (FOSAMAX) 70 MG tablet TAKE 1 TABLET ONCE EVERY  WEEK IN THE MORNING 30  MINUTES BEFORE EATING WITH  8 OUNCES OF WATER . SIT UP  FOR 30 MIN AFTER TAKING. 12 tablet 1  . aspirin EC 81 MG tablet Take 81 mg by mouth daily.    . Calcium Carb-Cholecalciferol (CALCIUM 1000 + D) 1000-800 MG-UNIT TABS Take 1 tablet by mouth daily.    . diazepam (VALIUM) 5 MG tablet One tablet once daily as needed , for anxiety 15 tablet 0  . hydrOXYzine (ATARAX/VISTARIL) 10 MG tablet One tablet at bedtime as needed, for itch 30 tablet 2  . LINZESS 290 MCG CAPS capsule TAKE 1 CAPSULE BY MOUTH  DAILY BEFORE BREAKFAST (Patient taking differently: TAKE 1 CAPSULE BY MOUTH  DAILY BEFORE BREAKFAST. Takes as needed) 90 capsule 3  . Multiple Vitamins-Minerals (CENTRUM ADULTS) TABS Take 1 tablet by mouth daily.    . Probiotic Product  (PROBIOTIC DAILY PO) Take by mouth daily.    . simvastatin (ZOCOR) 20 MG tablet TAKE 1 TABLET BY MOUTH AT  BEDTIME FOR CHOLESTEROL 90 tablet 1  . travoprost, benzalkonium, (TRAVATAN) 0.004 % ophthalmic solution Place 1 drop into both eyes at bedtime.      . vitamin C (ASCORBIC ACID) 500 MG tablet Take 500 mg by mouth daily.     No current facility-administered medications for this visit.     Allergies as of 09/09/2017  . (No Known Allergies)    ROS:  General: Negative for anorexia, weight loss, fever, chills, fatigue, weakness.weight is up 10 pounds in the past one year but down 4 pounds since 05/2017.  ENT: Negative for hoarseness, difficulty swallowing , nasal congestion. CV: Negative for chest pain, angina, palpitations, dyspnea on exertion, peripheral edema.  Respiratory: Negative for dyspnea at rest, dyspnea on exertion, cough, sputum, wheezing.  GI: See history of present illness. GU:  Negative for dysuria, hematuria, urinary incontinence, urinary frequency, nocturnal urination.  Endo: Negative for unusual weight change.    Physical Examination:   BP 140/83   Pulse 91   Temp 97.7 F (36.5 C) (Oral)   Ht 5\' 7"  (1.702 m)   Wt 127 lb 3.2 oz (57.7 kg)   BMI 19.92 kg/m   General: Well-nourished, well-developed in no acute distress.  Eyes: No icterus. Mouth: Oropharyngeal mucosa moist and pink , no lesions erythema or exudate. Lungs: Clear  to auscultation bilaterally.  Heart: Regular rate and rhythm, no murmurs rubs or gallops.  Abdomen: Bowel sounds are normal, nontender, nondistended, no hepatosplenomegaly or masses, no abdominal bruits or hernia , no rebound or guarding.   Extremities: No lower extremity edema. No clubbing or deformities. Neuro: Alert and oriented x 4   Skin: Warm and dry, no jaundice.   Psych: Alert and cooperative, normal mood and affect.  Iaging Studies: No results found.

## 2017-09-09 NOTE — Patient Instructions (Signed)
1. Start pantoprazole once daily before breakfast. Call with progress report in couple of weeks.

## 2017-09-10 NOTE — Assessment & Plan Note (Signed)
Feels like abdomen is bloated and some nausea since her illness back in January. Feels like abdomen bigger than usual. Some discomfort in upper abd with bening over. abd exam benign today. Offered her further diagnostic work up but she wants to try medication. She has been off PPI.   Start pantoprazole 40mg  daily before breakfast. Patient to call with progress report in couple of weeks. Depending on her response, will not rule out need for EGD or CT in the future. Patient voiced understanding.

## 2017-09-10 NOTE — Assessment & Plan Note (Signed)
Doing well. Taking Linzess prn only.

## 2017-09-11 NOTE — Progress Notes (Signed)
cc'ed to pcp °

## 2017-09-15 ENCOUNTER — Other Ambulatory Visit: Payer: Self-pay | Admitting: Family Medicine

## 2017-09-15 ENCOUNTER — Telehealth: Payer: Self-pay | Admitting: Family Medicine

## 2017-09-15 MED ORDER — DIAZEPAM 5 MG PO TABS: ORAL_TABLET | ORAL | 0 refills | Status: DC

## 2017-09-15 NOTE — Telephone Encounter (Signed)
diazepam (VALIUM) 5 MG tablet Please call in to [facility]

## 2017-09-15 NOTE — Telephone Encounter (Signed)
I sent In the medication

## 2017-09-15 NOTE — Telephone Encounter (Signed)
You sent her last rx in Jan for #15 tabs to take once daily. Just wanted to clarify how she should be taking it before I refill

## 2017-09-21 ENCOUNTER — Ambulatory Visit: Payer: Medicare Other

## 2017-09-28 ENCOUNTER — Ambulatory Visit (INDEPENDENT_AMBULATORY_CARE_PROVIDER_SITE_OTHER): Payer: Medicare Other

## 2017-09-28 ENCOUNTER — Other Ambulatory Visit: Payer: Self-pay

## 2017-09-28 ENCOUNTER — Telehealth: Payer: Self-pay | Admitting: Internal Medicine

## 2017-09-28 VITALS — BP 140/86 | HR 83 | Temp 98.1°F | Resp 16 | Ht 67.0 in | Wt 130.0 lb

## 2017-09-28 DIAGNOSIS — Z0000 Encounter for general adult medical examination without abnormal findings: Secondary | ICD-10-CM | POA: Diagnosis not present

## 2017-09-28 DIAGNOSIS — Z1231 Encounter for screening mammogram for malignant neoplasm of breast: Secondary | ICD-10-CM | POA: Diagnosis not present

## 2017-09-28 DIAGNOSIS — R112 Nausea with vomiting, unspecified: Secondary | ICD-10-CM

## 2017-09-28 DIAGNOSIS — R140 Abdominal distension (gaseous): Secondary | ICD-10-CM

## 2017-09-28 DIAGNOSIS — Z1239 Encounter for other screening for malignant neoplasm of breast: Secondary | ICD-10-CM

## 2017-09-28 NOTE — Telephone Encounter (Signed)
Routing to [facility] 

## 2017-09-28 NOTE — Progress Notes (Signed)
Subjective:   Tamara Lam is a 67 y.o. female who presents for Medicare Annual (Subsequent) preventive examination.  Review of Systems:   Cardiac Risk Factors include: advanced age (>74men, >59 women);hypertension     Objective:     Vitals: BP 140/86 (BP Location: Left Arm, Patient Position: Sitting, Cuff Size: Normal)   Pulse 83   Temp 98.1 F (36.7 C) (Temporal)   Resp 16   Ht 5\' 7"  (1.702 m)   Wt 130 lb (59 kg)   SpO2 98%   BMI 20.36 kg/m   Body mass index is 20.36 kg/m.  Advanced Directives 09/28/2017 07/23/2016 07/22/2016 07/09/2016 07/04/2016 06/24/2016 06/11/2016  Does Patient Have a Medical Advance Directive? Yes Yes Yes Yes Yes Yes Yes  Type of Advance Directive - [place];Living will [place];Living will [place];Living will [place];Living will [place];Living will [place];Living will  Does patient want to make changes to medical advance directive? No - Patient declined No - Patient declined No - Patient declined - - - No - Patient declined  Copy of [place] in Chart? - Yes Yes Yes Yes Yes Yes  Would patient like information on creating a medical advance directive? - No - Patient declined - No - Patient declined No - Patient declined No - Patient declined No - Patient declined  Pre-existing out of facility DNR order (yellow form or pink MOST form) - - - - - - -    Tobacco Social History   Tobacco Use  Smoking Status Never Smoker  Smokeless Tobacco Never Used  Tobacco Comment   Never smoked     Counseling given: Yes Comment: Never smoked   Clinical Intake:  Pre-visit preparation completed: Yes  Pain : No/denies pain Pain Score: 0-No pain     Diabetes: No  How often do you need to have someone help you when you read instructions, pamphlets, or other written materials from your doctor or pharmacy?: 1 -  Never  Interpreter Needed?: No     Past Medical History:  Diagnosis Date  . Abnormal CXR (chest x-ray)    Evaluated with Pulmonary- Dr. Luan Pulling- "pt. was told probably due to thin body frame"-saw no issues-PFT test normal.  . Anxiety 1980's   . Arthritis    osteoarthritis -knees, hands  . Chronic constipation    tx. Linzess  . Chronic nausea   . Diabetes mellitus without complication ([place])    V7Q level checked borderline x1, then further evaluated -Diabetes ruled out.  . Glaucoma 2004   Dr. Venetia Maxon, laser to left eye Sept 2011, bilateral eye drops for this  . Helicobacter pylori gastritis 04/2004   last EGD   . Hematochezia   . Hx of colonoscopy 08/2005   Dr. Gala Romney / hemorrhoids   . Thyroid disease    thyroid goiter, nodules-no problems.  . Weight loss    Past Surgical History:  Procedure Laterality Date  . ABDOMINAL HYSTERECTOMY    . arthoscopic surgery on right knee  2007  . CHOLECYSTECTOMY  1990's  . COLONOSCOPY  2011   Dr. Gala Romney: anal canal/internal hemorrhoids, dimintuive rectosigmoid polyp (polypoid rectal mucosa), few sigmoid diverticula   . COLONOSCOPY N/A 10/30/2015   Dr. Gala Romney: diverticulosis, hemorrhoids, surveillance in 2022   . TOTAL KNEE ARTHROPLASTY Right 05/26/2016   Procedure: RIGHT TOTAL KNEE ARTHROPLASTY;  Surgeon: Gaynelle Arabian, MD;  Location: [facility];  Service: Orthopedics;  Laterality: Right;  Marland Kitchen VESICOVAGINAL FISTULA CLOSURE W/ TAH  1989   Family History  Problem Relation Age of Onset  . Hypertension Mother   . Glaucoma Mother   . Diverticulosis Mother   . Heart disease Mother   . Lung cancer Father   . Mental illness Sister   . Cancer Sister   . Leukemia Brother        some form of leukemia  . Colon polyps Son   . Colon cancer Neg Hx    Social History   Socioeconomic History  . Marital status: Widowed    Spouse name: Not on file  . Number of children: Not on file  . Years of education: Not on file  . Highest education level: Not on  file  Occupational History  . Occupation: homemaker     Employer: UNEMPLOYED  Social Needs  . Financial resource strain: Not hard at all  . Food insecurity:    Worry: Never true    Inability: Never true  . Transportation needs:    Medical: No    Non-medical: No  Tobacco Use  . Smoking status: Never Smoker  . Smokeless tobacco: Never Used  . Tobacco comment: Never smoked  Substance and Sexual Activity  . Alcohol use: No    Alcohol/week: 0.0 oz  . Drug use: No  . Sexual activity: Not Currently  Lifestyle  . Physical activity:    Days per week: 4 days    Minutes per session: 50 min  . Stress: Only a little  Relationships  . Social connections:    Talks on phone: More than three times a week    Gets together: Once a week    Attends religious service: More than 4 times per year    Active member of club or organization: Yes    Attends meetings of clubs or organizations: More than 4 times per year    Relationship status: Widowed  Other Topics Concern  . Not on file  Social History Narrative  . Not on file    Outpatient Encounter Medications as of 09/28/2017  Medication Sig  . alendronate (FOSAMAX) 70 MG tablet TAKE 1 TABLET ONCE EVERY  WEEK IN THE MORNING 30  MINUTES BEFORE EATING WITH  8 OUNCES OF WATER . SIT UP  FOR 30 MIN AFTER TAKING.  Marland Kitchen aspirin EC 81 MG tablet Take 81 mg by mouth daily.  . Calcium Carb-Cholecalciferol (CALCIUM 1000 + D) 1000-800 MG-UNIT TABS Take 1 tablet by mouth daily.  . diazepam (VALIUM) 5 MG tablet One tablet once daily, as needed, for anxiety  . hydrOXYzine (ATARAX/VISTARIL) 10 MG tablet One tablet at bedtime as needed, for itch  . LINZESS 290 MCG CAPS capsule TAKE 1 CAPSULE BY MOUTH  DAILY BEFORE BREAKFAST (Patient taking differently: TAKE 1 CAPSULE BY MOUTH  DAILY BEFORE BREAKFAST. Takes as needed)  . Multiple Vitamins-Minerals (CENTRUM ADULTS) TABS Take 1 tablet by mouth daily.  . Probiotic Product (PROBIOTIC DAILY PO) Take by mouth daily.  .  simvastatin (ZOCOR) 20 MG tablet TAKE 1 TABLET BY MOUTH AT  BEDTIME FOR CHOLESTEROL  . travoprost, benzalkonium, (TRAVATAN) 0.004 % ophthalmic solution Place 1 drop into both eyes at bedtime.    . vitamin C (ASCORBIC ACID) 500 MG tablet Take 500 mg by mouth daily.  . pantoprazole (PROTONIX) 40 MG tablet Take 1 tablet (40 mg total) by mouth daily before breakfast. (Patient not taking: Reported on 09/28/2017)   No facility-administered encounter medications on file as  of 09/28/2017.     Activities of Daily Living In your present state of health, do you have any difficulty performing the following activities: 09/28/2017  Hearing? N  Vision? Y  Difficulty concentrating or making decisions? N  Walking or climbing stairs? N  Dressing or bathing? N  Doing errands, shopping? N  Preparing Food and eating ? N  Using the Toilet? N  In the past six months, have you accidently leaked urine? N  Do you have problems with loss of bowel control? N  Managing your Medications? N  Managing your Finances? N  Housekeeping or managing your Housekeeping? N  Some recent data might be hidden    Patient Care Team: Fayrene Helper, MD as PCP - General Rourk, Cristopher Estimable, MD as Consulting Physician (Gastroenterology) Cassandria Anger, MD as Consulting Physician (Endocrinology)    Assessment:   This is a routine wellness examination for Moani.  Exercise Activities and Dietary recommendations Current Exercise Habits: Home exercise routine, Type of exercise: walking, Time (Minutes): 50, Frequency (Times/Week): 4, Weekly Exercise (Minutes/Week): 200, Intensity: Moderate, Exercise limited by: None identified  Goals    None      Fall Risk Fall Risk  09/28/2017 08/05/2016 07/22/2016 11/13/2015 07/17/2015  Falls in the past year? No No No No No   Is the patient's home free of loose throw rugs in walkways, pet beds, electrical cords, etc?   yes      Grab bars in the bathroom? yes      Handrails on the stairs?    yes      Adequate lighting?   yes  Depression Screen PHQ 2/9 Scores 09/28/2017 08/05/2016 07/22/2016 11/13/2015  PHQ - 2 Score 1 0 1 1  PHQ- 9 Score - - - -     Cognitive Function     6CIT Screen 07/22/2016  What Year? 0 points  What month? 0 points  What time? 0 points  Count back from 20 0 points  Months in reverse 0 points  Repeat phrase 0 points  Total Score 0    Immunization History  Administered Date(s) Administered  . Influenza Whole 03/13/2010, 03/12/2011  . Influenza,inj,Quad PF,6+ Mos 04/24/2013, 02/20/2014, 03/27/2015, 04/28/2016, 04/14/2017  . Pneumococcal Conjugate-13 06/20/2014  . Pneumococcal Polysaccharide-23 05/12/2016  . Td 08/16/2009  . Zoster 02/17/2011    Qualifies for Shingles Vaccine? Yes. Discussed the importance of the vaccine as well as verifying coverage through insurance  Screening Tests Health Maintenance  Topic Date Due  . OPHTHALMOLOGY EXAM  04/12/2015  . FOOT EXAM  05/19/2019 (Originally 11/14/2016)  . HEMOGLOBIN A1C  05/19/2019 (Originally 11/02/2016)  . URINE MICROALBUMIN  12/16/2019 (Originally 11/12/2016)  . INFLUENZA VACCINE  01/28/2018  . MAMMOGRAM  11/05/2018  . TETANUS/TDAP  08/17/2019  . COLONOSCOPY  10/29/2025  . DEXA SCAN  Completed  . Hepatitis C Screening  Completed  . PNA vac Low Risk Adult  Completed    Cancer Screenings: Lung: Low Dose CT Chest recommended if Age 43-80 years, 30 pack-year currently smoking OR have quit w/in 15years. Patient does not qualify. Breast:  Up to date on Mammogram? Yes   Up to date of Bone Density/Dexa? Yes Colorectal: Up to date     Plan:      I have personally reviewed and noted the following in the patient's chart:   . Medical and social history . Use of alcohol, tobacco or illicit drugs  . Current medications and supplements . Functional ability and status . Nutritional  status . Physical activity . Advanced directives . List of other physicians . Hospitalizations, surgeries, and  ER visits in previous 12 months . Vitals . Screenings to include cognitive, depression, and falls . Referrals and appointments  In addition, I have reviewed and discussed with patient certain preventive protocols, quality metrics, and best practice recommendations. A written personalized care plan for preventive services as well as general preventive health recommendations were provided to patient.     Merceda Elks, LPN  07/01/8784

## 2017-09-28 NOTE — Telephone Encounter (Signed)
CT A/P with contrast for abdominal distention, abdominal bloating, nausea, generalized abd pain.

## 2017-09-28 NOTE — Telephone Encounter (Signed)
CT abd/pelvis w/contrast scheduled for 10/13/17 at 3:00pm, arrive at 2:45pm. Pick up contrast. NPO 4 hours prior to test. Tried to call pt, no answer, LMOAM for her to call office.

## 2017-09-28 NOTE — Telephone Encounter (Signed)
[phone]  Patient called to state that the meds ab put her on to try are not working

## 2017-09-28 NOTE — Telephone Encounter (Signed)
Spoke with pt. Pt was calling back as directed after taking Pantoprazole x 2 weeks. Pt had an episode on Friday and Saturday with nausea and severe stomach pain. Pt wasn't able to eat much until Sunday. Pt is feeling pretty good today.  Pt would like to have the CT scan of the Abdomen.

## 2017-09-28 NOTE — Patient Instructions (Signed)
Tamara Lam , Thank you for taking time to come for your Medicare Wellness Visit. I appreciate your ongoing commitment to your health goals. Please review the following plan we discussed and let me know if I can assist you in the future.   Screening recommendations/referrals: Colonoscopy:  2022 Mammogram: 10/2017 Bone Density: Done Recommended yearly ophthalmology/optometry visit for glaucoma screening and checkup Recommended yearly dental visit for hygiene and checkup  Vaccinations: Influenza vaccine: 02/2018 Pneumococcal vaccine: 2022 Tdap vaccine: 2021 Shingles vaccine: Please consider updating to the Shingrix vaccine. Also, please verify coverage through your insurance    Advanced directives: Discussed in office  Conditions/risks identified: None  Next appointment: 12/08/17   Preventive Care 65 Years and Older, Female Preventive care refers to lifestyle choices and visits with your health care provider that can promote health and wellness. What does preventive care include?  A yearly physical exam. This is also called an annual well check.  Dental exams once or twice a year.  Routine eye exams. Ask your health care provider how often you should have your eyes checked.  Personal lifestyle choices, including:  Daily care of your teeth and gums.  Regular physical activity.  Eating a healthy diet.  Avoiding tobacco and drug use.  Limiting alcohol use.  Practicing safe sex.  Taking low-dose aspirin every day.  Taking vitamin and mineral supplements as recommended by your health care provider. What happens during an annual well check? The services and screenings done by your health care provider during your annual well check will depend on your age, overall health, lifestyle risk factors, and family history of disease. Counseling  Your health care provider may ask you questions about your:  Alcohol use.  Tobacco use.  Drug use.  Emotional well-being.  Home  and relationship well-being.  Sexual activity.  Eating habits.  History of falls.  Memory and ability to understand (cognition).  Work and work Statistician.  Reproductive health. Screening  You may have the following tests or measurements:  Height, weight, and BMI.  Blood pressure.  Lipid and cholesterol levels. These may be checked every 5 years, or more frequently if you are over 32 years old.  Skin check.  Lung cancer screening. You may have this screening every year starting at age 40 if you have a 30-pack-year history of smoking and currently smoke or have quit within the past 15 years.  Fecal occult blood test (FOBT) of the stool. You may have this test every year starting at age 55.  Flexible sigmoidoscopy or colonoscopy. You may have a sigmoidoscopy every 5 years or a colonoscopy every 10 years starting at age 5.  Hepatitis C blood test.  Hepatitis B blood test.  Sexually transmitted disease (STD) testing.  Diabetes screening. This is done by checking your blood sugar (glucose) after you have not eaten for a while (fasting). You may have this done every 1-3 years.  Bone density scan. This is done to screen for osteoporosis. You may have this done starting at age 59.  Mammogram. This may be done every 1-2 years. Talk to your health care provider about how often you should have regular mammograms. Talk with your health care provider about your test results, treatment options, and if necessary, the need for more tests. Vaccines  Your health care provider may recommend certain vaccines, such as:  Influenza vaccine. This is recommended every year.  Tetanus, diphtheria, and acellular pertussis (Tdap, Td) vaccine. You may need a Td booster every 10 years.  Zoster vaccine. You may need this after age 85.  Pneumococcal 13-valent conjugate (PCV13) vaccine. One dose is recommended after age 72.  Pneumococcal polysaccharide (PPSV23) vaccine. One dose is recommended  after age 61. Talk to your health care provider about which screenings and vaccines you need and how often you need them. This information is not intended to replace advice given to you by your health care provider. Make sure you discuss any questions you have with your health care provider. Document Released: 07/13/2015 Document Revised: 03/05/2016 Document Reviewed: 04/17/2015 Elsevier Interactive Patient Education  2017 [place] Prevention in the Home Falls can cause injuries. They can happen to people of all ages. There are many things you can do to make your home safe and to help prevent falls. What can I do on the outside of my home?  Regularly fix the edges of walkways and driveways and fix any cracks.  Remove anything that might make you trip as you walk through a door, such as a raised step or threshold.  Trim any bushes or trees on the path to your home.  Use bright outdoor lighting.  Clear any walking paths of anything that might make someone trip, such as rocks or tools.  Regularly check to see if handrails are loose or broken. Make sure that both sides of any steps have handrails.  Any raised decks and porches should have guardrails on the edges.  Have any leaves, snow, or ice cleared regularly.  Use sand or salt on walking paths during winter.  Clean up any spills in your garage right away. This includes oil or grease spills. What can I do in the bathroom?  Use night lights.  Install grab bars by the toilet and in the tub and shower. Do not use towel bars as grab bars.  Use non-skid mats or decals in the tub or shower.  If you need to sit down in the shower, use a plastic, non-slip stool.  Keep the floor dry. Clean up any water that spills on the floor as soon as it happens.  Remove soap buildup in the tub or shower regularly.  Attach bath mats securely with double-sided non-slip rug tape.  Do not have throw rugs and other things on the floor  that can make you trip. What can I do in the bedroom?  Use night lights.  Make sure that you have a light by your bed that is easy to reach.  Do not use any sheets or blankets that are too big for your bed. They should not hang down onto the floor.  Have a firm chair that has side arms. You can use this for support while you get dressed.  Do not have throw rugs and other things on the floor that can make you trip. What can I do in the kitchen?  Clean up any spills right away.  Avoid walking on wet floors.  Keep items that you use a lot in easy-to-reach places.  If you need to reach something above you, use a strong step stool that has a grab bar.  Keep electrical cords out of the way.  Do not use floor polish or wax that makes floors slippery. If you must use wax, use non-skid floor wax.  Do not have throw rugs and other things on the floor that can make you trip. What can I do with my stairs?  Do not leave any items on the stairs.  Make sure that there are  handrails on both sides of the stairs and use them. Fix handrails that are broken or loose. Make sure that handrails are as long as the stairways.  Check any carpeting to make sure that it is firmly attached to the stairs. Fix any carpet that is loose or worn.  Avoid having throw rugs at the top or bottom of the stairs. If you do have throw rugs, attach them to the floor with carpet tape.  Make sure that you have a light switch at the top of the stairs and the bottom of the stairs. If you do not have them, ask someone to add them for you. What else can I do to help prevent falls?  Wear shoes that:  Do not have high heels.  Have rubber bottoms.  Are comfortable and fit you well.  Are closed at the toe. Do not wear sandals.  If you use a stepladder:  Make sure that it is fully opened. Do not climb a closed stepladder.  Make sure that both sides of the stepladder are locked into place.  Ask someone to hold it  for you, if possible.  Clearly mark and make sure that you can see:  Any grab bars or handrails.  First and last steps.  Where the edge of each step is.  Use tools that help you move around (mobility aids) if they are needed. These include:  Canes.  Walkers.  Scooters.  Crutches.  Turn on the lights when you go into a dark area. Replace any light bulbs as soon as they burn out.  Set up your furniture so you have a clear path. Avoid moving your furniture around.  If any of your floors are uneven, fix them.  If there are any pets around you, be aware of where they are.  Review your medicines with your doctor. Some medicines can make you feel dizzy. This can increase your chance of falling. Ask your doctor what other things that you can do to help prevent falls. This information is not intended to replace advice given to you by your health care provider. Make sure you discuss any questions you have with your health care provider. Document Released: 04/12/2009 Document Revised: 11/22/2015 Document Reviewed: 07/21/2014 Elsevier Interactive Patient Education  2017 [place] for Adults  A healthy lifestyle and preventive care can promote health and wellness. Preventive health guidelines for adults include the following key practices.  . A routine yearly physical is a good way to check with your health care provider about your health and preventive screening. It is a chance to share any concerns and updates on your health and to receive a thorough exam.  . Visit your dentist for a routine exam and preventive care every 6 months. Brush your teeth twice a day and floss once a day. Good oral hygiene prevents tooth decay and gum disease.  . The frequency of eye exams is based on your age, health, family medical history, use  of contact lenses, and other factors. Follow your health care provider's ecommendations for frequency of eye exams.  . Eat a healthy diet.  Foods like vegetables, fruits, whole grains, low-fat dairy products, and lean protein foods contain the nutrients you need without too many calories. Decrease your intake of foods high in solid fats, added sugars, and salt. Eat the right amount of calories for you. Get information about a proper diet from your health care provider, if necessary.  . Regular physical exercise is one of  the most important things you can do for your health. Most adults should get at least 150 minutes of moderate-intensity exercise (any activity that increases your heart rate and causes you to sweat) each week. In addition, most adults need muscle-strengthening exercises on 2 or more days a week.  Silver Sneakers may be a benefit available to you. To determine eligibility, you may visit the website: www.silversneakers.com or contact program at 747-139-7195 Mon-Fri between 8AM-8PM.   . Maintain a healthy weight. The body mass index (BMI) is a screening tool to identify possible weight problems. It provides an estimate of body fat based on height and weight. Your health care provider can find your BMI and can help you achieve or maintain a healthy weight.   For adults 20 years and older: ? A BMI below 18.5 is considered underweight. ? A BMI of 18.5 to 24.9 is normal. ? A BMI of 25 to 29.9 is considered overweight. ? A BMI of 30 and above is considered obese.   . Maintain normal blood lipids and cholesterol levels by exercising and minimizing your intake of saturated fat. Eat a balanced diet with plenty of fruit and vegetables. Blood tests for lipids and cholesterol should begin at age 42 and be repeated every 5 years. If your lipid or cholesterol levels are high, you are over 50, or you are at high risk for heart disease, you may need your cholesterol levels checked more frequently. Ongoing high lipid and cholesterol levels should be treated with medicines if diet and exercise are not working.  . If you smoke, find out  from your health care provider how to quit. If you do not use tobacco, please do not start.  . If you choose to drink alcohol, please do not consume more than 2 drinks per day. One drink is considered to be 12 ounces (355 mL) of beer, 5 ounces (148 mL) of wine, or 1.5 ounces (44 mL) of liquor.  . If you are 6-82 years old, ask your health care provider if you should take aspirin to prevent strokes.  . Use sunscreen. Apply sunscreen liberally and repeatedly throughout the day. You should seek shade when your shadow is shorter than you. Protect yourself by wearing long sleeves, pants, a wide-brimmed hat, and sunglasses year round, whenever you are outdoors.  . Once a month, do a whole body skin exam, using a mirror to look at the skin on your back. Tell your health care provider of new moles, moles that have irregular borders, moles that are larger than a pencil eraser, or moles that have changed in shape or color.

## 2017-09-28 NOTE — Telephone Encounter (Signed)
No PA needed for CT per [employer] website. °

## 2017-09-29 MED ORDER — ONDANSETRON HCL 4 MG PO TABS: 4.0000 mg | ORAL_TABLET | Freq: Three times a day (TID) | ORAL | 0 refills | Status: DC | PRN

## 2017-09-29 NOTE — Telephone Encounter (Signed)
Called and informed pt of CT appt and instructions. Gave her phone number to Central Scheduling to check for cancellation as she is scheduled for 1st available. Letter mailed.

## 2017-09-29 NOTE — Addendum Note (Signed)
Addended by: Mahala Menghini on: 09/29/2017 01:28 PM   Modules accepted: Orders

## 2017-09-29 NOTE — Telephone Encounter (Signed)
Pt called office. Requests med for nausea be sent to [facility].

## 2017-09-29 NOTE — Telephone Encounter (Signed)
rx done

## 2017-10-13 ENCOUNTER — Ambulatory Visit (HOSPITAL_COMMUNITY)
Admission: RE | Admit: 2017-10-13 | Discharge: 2017-10-13 | Disposition: A | Discharge status: Home / Self Care | Payer: Medicare Other | Source: Ambulatory Visit | Attending: Gastroenterology | Admitting: Gastroenterology

## 2017-10-13 DIAGNOSIS — R112 Nausea with vomiting, unspecified: Secondary | ICD-10-CM | POA: Diagnosis not present

## 2017-10-13 DIAGNOSIS — R1110 Vomiting, unspecified: Secondary | ICD-10-CM | POA: Diagnosis not present

## 2017-10-13 DIAGNOSIS — R140 Abdominal distension (gaseous): Secondary | ICD-10-CM

## 2017-10-13 DIAGNOSIS — N200 Calculus of kidney: Secondary | ICD-10-CM | POA: Diagnosis not present

## 2017-10-13 DIAGNOSIS — K5730 Diverticulosis of large intestine without perforation or abscess without bleeding: Secondary | ICD-10-CM | POA: Insufficient documentation

## 2017-10-13 LAB — POCT I-STAT CREATININE: Creatinine, Ser: 0.7 mg/dL (ref 0.44–1.00)

## 2017-10-13 MED ORDER — IOPAMIDOL (ISOVUE-300) INJECTION 61%
100.0000 mL | Freq: Once | INTRAVENOUS | Status: AC | PRN
  Administered 2017-10-13: 100 mL via INTRAVENOUS

## 2017-10-18 ENCOUNTER — Other Ambulatory Visit: Payer: Self-pay | Admitting: Family Medicine

## 2017-10-27 ENCOUNTER — Telehealth: Payer: Self-pay | Admitting: Internal Medicine

## 2017-10-27 NOTE — Telephone Encounter (Signed)
Pt was returning a call to AM about her CT results. Please call her at [phone]

## 2017-11-03 ENCOUNTER — Encounter: Payer: Self-pay | Admitting: Family Medicine

## 2017-11-03 ENCOUNTER — Ambulatory Visit: Payer: Medicare Other | Admitting: Family Medicine

## 2017-11-03 ENCOUNTER — Other Ambulatory Visit: Payer: Self-pay | Admitting: Family Medicine

## 2017-11-03 VITALS — BP 138/88 | HR 88 | Resp 16 | Ht 67.0 in | Wt 124.0 lb

## 2017-11-03 DIAGNOSIS — R1030 Lower abdominal pain, unspecified: Secondary | ICD-10-CM

## 2017-11-03 DIAGNOSIS — Z1231 Encounter for screening mammogram for malignant neoplasm of breast: Secondary | ICD-10-CM

## 2017-11-03 DIAGNOSIS — R1010 Upper abdominal pain, unspecified: Secondary | ICD-10-CM | POA: Diagnosis not present

## 2017-11-03 DIAGNOSIS — R208 Other disturbances of skin sensation: Secondary | ICD-10-CM

## 2017-11-03 DIAGNOSIS — N898 Other specified noninflammatory disorders of vagina: Secondary | ICD-10-CM | POA: Diagnosis not present

## 2017-11-03 DIAGNOSIS — R110 Nausea: Secondary | ICD-10-CM

## 2017-11-03 NOTE — Progress Notes (Signed)
   Tamara Lam     MRN: [id number]      DOB: [birth year]   HPI Tamara Lam is here with c/o h/o disabling nauea for years , worse since March , poor appetite, and also c/o lower abdominal cramping pain, and nagging pain , hurting, sometimes very severe, radiaites even up to flank area, recently so severe at a funeral over a 10,o vomit.  BM are frequent, when not feeling "like this: she depends on linzess, has not seen blood in stool When not having theses symptoms once daily, but in current state without linzess going 2 to 3 times daily 3 days per week experiences vaginal itching , inside of her vagina high up approx 12 months ago approx 2 days per week , never d/ c not sexually active , still intermittent but not frequent, lower vaginal itch x 2. Months  Bilateral burning o both feet and hypersensitivity, heels and top of feet are sore tender and burn x 1 year rated a  7 getting worse over the past year Does not disturb sleep worse in the day shoes bother her  ROS See HPI  Denies recent fever or chills. Denies sinus pressure, nasal congestion, ear pain or sore throat. Denies chest congestion, productive cough or wheezing. Denies chest pains, palpitations and leg swelling .   Denies dysuria, frequency, hesitancy or incontinence. Denies joint pain, swelling and limitation in mobility. Denies headaches, seizures, .  Denies skin break down or rash.   PE  BP 138/88   Pulse 88   Resp 16   Ht 5\' 7"  (1.702 m)   Wt 124 lb (56.2 kg)   SpO2 98%   BMI 19.42 kg/m   Patient alert and oriented and in no cardiopulmonary distress.  HEENT: No facial asymmetry, EOMI,   oropharynx pink and moist.  Neck supple no JVD, no mass.  Chest: Clear to auscultation bilaterally.  CVS: S1, S2 no murmurs, no S3.Regular rate.  ABD: Soft non tender.   Ext: No edema  MS: Adequate ROM spine, shoulders, hips and knees.  Skin: Intact, no ulcerations or rash noted.  Psych: Good eye contact, normal  affect. Memory intact not anxious or depressed appearing.  CNS: CN 2-12 intact, power,  normal throughout.no focal deficits noted.   Assessment & Plan  Nausea without vomiting Chronic abdominal pain and nausea disabling x 3 months, has been present for years, requests consultation with her GI Doc, states in the past was treated for H pylori with symptom resolution, not sure what to do, recommend she speak directly with Dr Sydell Axon , she had questions about a 2nd opinion , I encouraged her to discuss openly with him, I also said that he obvious next step was as he proposed upper endo, I will also message Dr Sydell Axon  Abdominal pain Cramping disabling abdominal pain in past 3 months, needs further GI eval  Vaginal itching One year h/o intermittent vaginal itching , initially upper vaginal area, however in past 3 months pt reports lower vaginal itch Not sexually active, denies vaginal dryness or discharge, she has a hysterectomy  Burning sensation of feet 1 year h/o burning of feet no skin lesions visible, needs podiatry eval

## 2017-11-03 NOTE — Patient Instructions (Addendum)
F/u in 3.5 months, call if you need me before  Call back with name of gynecologist tomorrow and leave with front desk  Call back wirh name of podiatrist at [facility] on [address] please    I will, message Dr Sydell Axon so that you can have a consultation with him, I do recommend upper endoscopy as an initial step  Also, and absolutely 2nd opinions when r desired are always a good thing l round

## 2017-11-04 ENCOUNTER — Telehealth: Payer: Self-pay | Admitting: Family Medicine

## 2017-11-04 NOTE — Telephone Encounter (Signed)
Patient is requesting referral for podiatry to be sent to Dr.Martha Ajlouny  [phone] As well as  [facility] Dr. Bobbye Morton [phone]

## 2017-11-06 DIAGNOSIS — R208 Other disturbances of skin sensation: Secondary | ICD-10-CM | POA: Insufficient documentation

## 2017-11-06 DIAGNOSIS — N898 Other specified noninflammatory disorders of vagina: Secondary | ICD-10-CM | POA: Insufficient documentation

## 2017-11-06 NOTE — Assessment & Plan Note (Signed)
1 year h/o burning of feet no skin lesions visible, needs podiatry eval

## 2017-11-06 NOTE — Assessment & Plan Note (Signed)
Cramping disabling abdominal pain in past 3 months, needs further GI eval

## 2017-11-06 NOTE — Assessment & Plan Note (Signed)
One year h/o intermittent vaginal itching , initially upper vaginal area, however in past 3 months pt reports lower vaginal itch Not sexually active, denies vaginal dryness or discharge, she has a hysterectomy

## 2017-11-06 NOTE — Assessment & Plan Note (Signed)
Chronic abdominal pain and nausea disabling x 3 months, has been present for years, requests consultation with her GI Doc, states in the past was treated for H pylori with symptom resolution, not sure what to do, recommend she speak directly with Dr Sydell Axon , she had questions about a 2nd opinion , I encouraged her to discuss openly with him, I also said that he obvious next step was as he proposed upper endo, I will also message Dr Sydell Axon

## 2017-11-09 ENCOUNTER — Encounter: Payer: Self-pay | Admitting: Internal Medicine

## 2017-11-16 NOTE — Telephone Encounter (Signed)
Referrals were entered more than 12 days ago

## 2017-11-26 IMAGING — DX DG CHEST 2V
2 series · 2 of 2 positions shown · non-contrast
Comparison: 10/19/2014

CLINICAL DATA: Preop knee replacement surgery

EXAM:
CHEST  2 VIEW

[chest pa]
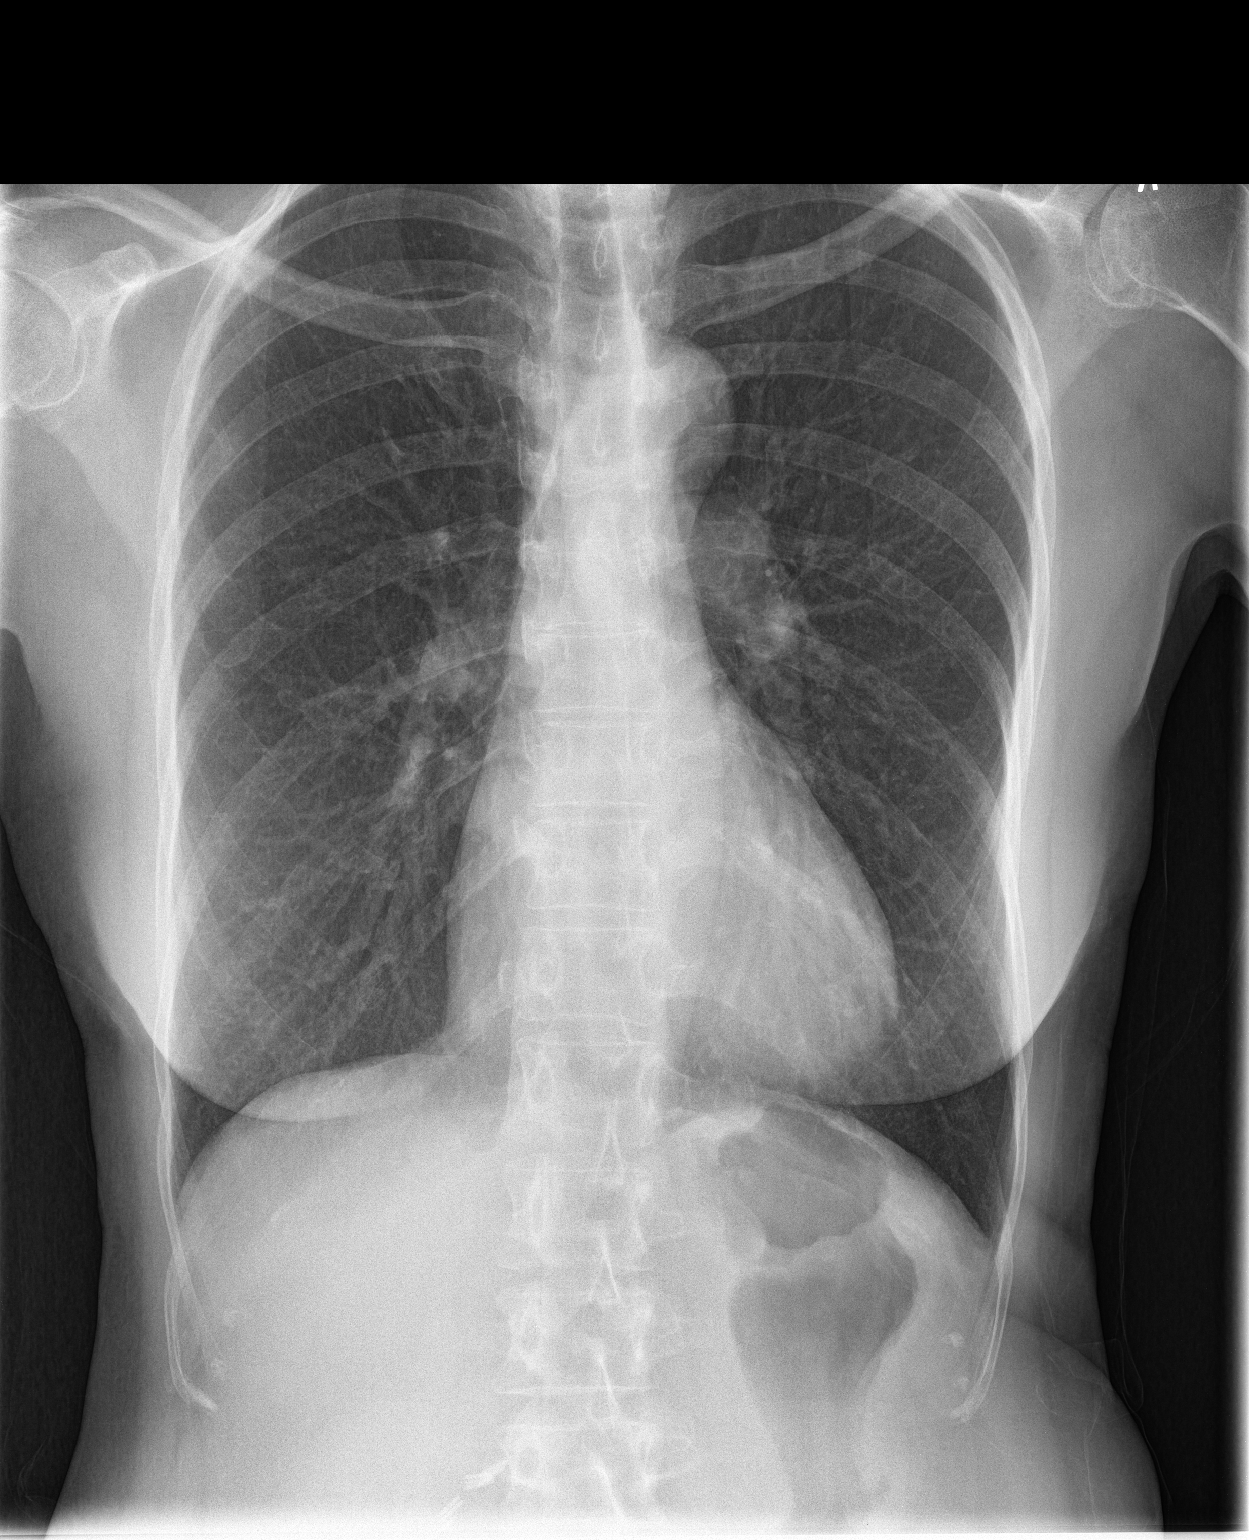

[chest lat]
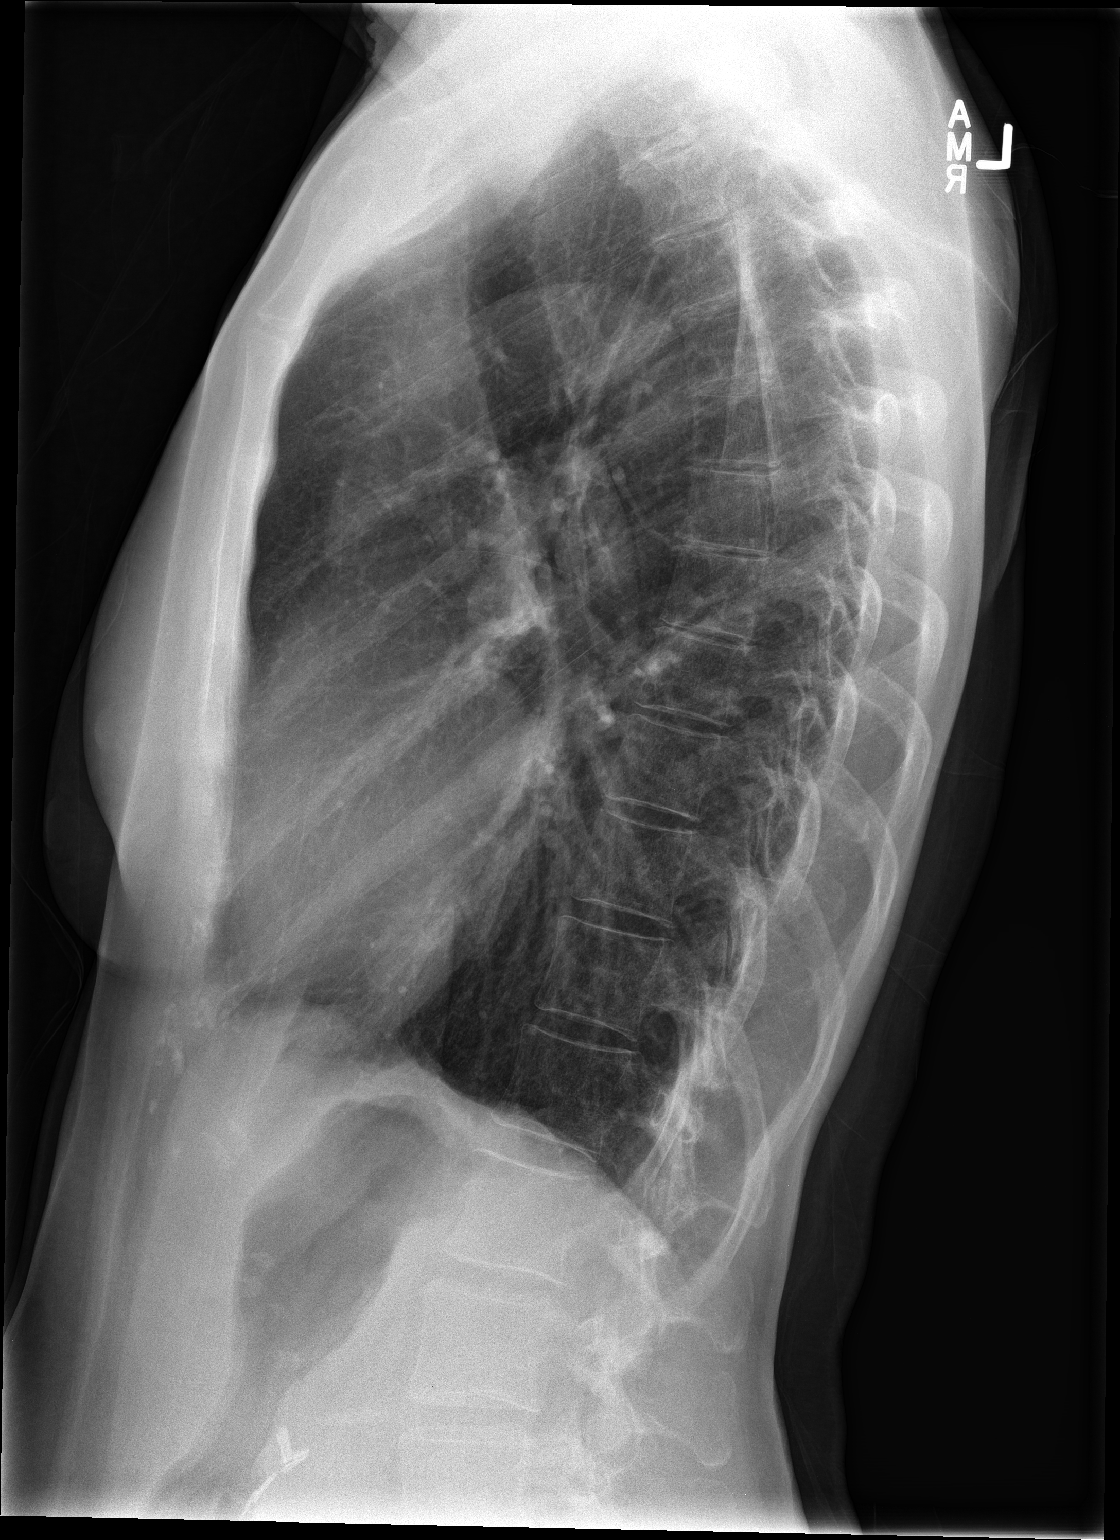

[2 of 2 positions shown; findings below may reference images not displayed]

FINDINGS: Pulmonary hyperinflation which is more prominent compared with the
prior study. Negative for pneumonia. Heart size upper normal.
Negative for heart failure. Lungs remain clear without infiltrate
effusion or mass.
IMPRESSION: Pulmonary hyperinflation has progressed in the interval. No other
acute abnormality.

## 2017-11-27 ENCOUNTER — Other Ambulatory Visit: Payer: Self-pay | Admitting: Family Medicine

## 2017-11-27 DIAGNOSIS — M7662 Achilles tendinitis, left leg: Secondary | ICD-10-CM | POA: Diagnosis not present

## 2017-11-27 DIAGNOSIS — M79672 Pain in left foot: Secondary | ICD-10-CM | POA: Diagnosis not present

## 2017-11-27 DIAGNOSIS — M792 Neuralgia and neuritis, unspecified: Secondary | ICD-10-CM | POA: Diagnosis not present

## 2017-11-27 DIAGNOSIS — M79671 Pain in right foot: Secondary | ICD-10-CM | POA: Diagnosis not present

## 2017-11-27 DIAGNOSIS — M7661 Achilles tendinitis, right leg: Secondary | ICD-10-CM | POA: Diagnosis not present

## 2017-12-01 ENCOUNTER — Other Ambulatory Visit: Payer: Self-pay | Admitting: Obstetrics and Gynecology

## 2017-12-01 ENCOUNTER — Ambulatory Visit
Admission: RE | Admit: 2017-12-01 | Discharge: 2017-12-01 | Disposition: A | Discharge status: Home / Self Care | Payer: Medicare Other | Source: Ambulatory Visit | Attending: Family Medicine | Admitting: Family Medicine

## 2017-12-01 DIAGNOSIS — Z1231 Encounter for screening mammogram for malignant neoplasm of breast: Secondary | ICD-10-CM

## 2017-12-02 ENCOUNTER — Other Ambulatory Visit: Payer: Self-pay | Admitting: Obstetrics and Gynecology

## 2017-12-06 IMAGING — DX DG FOREARM 2V*R*
2 series · 2 of 2 positions shown · non-contrast
Comparison: None.

CLINICAL DATA: Pt c/o aching pain in Rt forearm x 4 wks without
injury.

EXAM:
RIGHT FOREARM - 2 VIEW

[forearm ap]
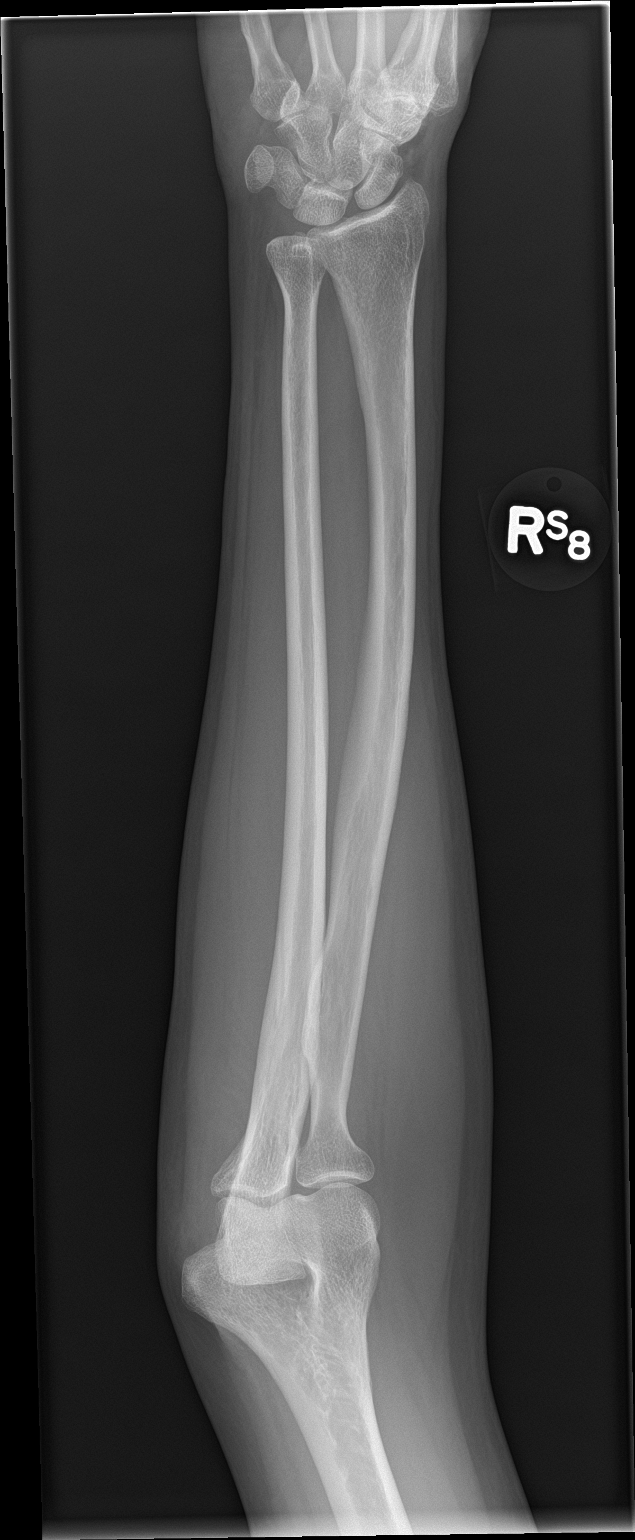

[forearm lat]
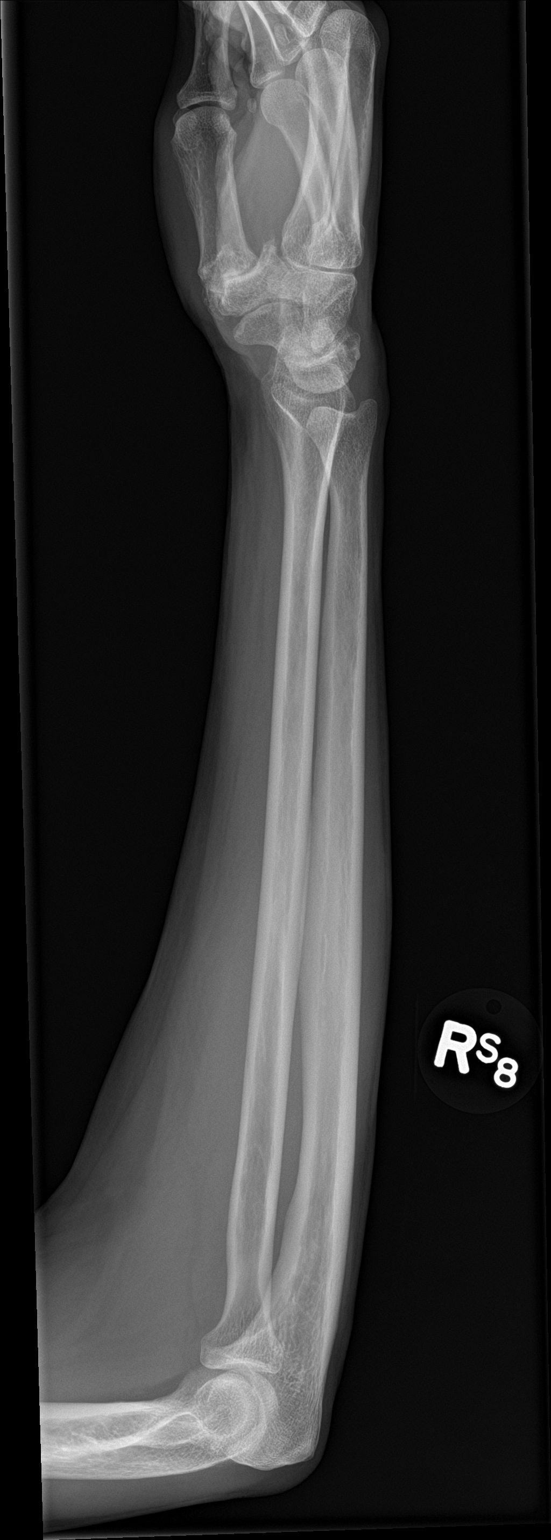

[2 of 2 positions shown; findings below may reference images not displayed]

FINDINGS: No fracture.  No bone lesion.

Wrist and elbow joints are normally aligned. No significant
arthropathic change.

Soft tissues are unremarkable.
IMPRESSION: Negative.

## 2017-12-08 ENCOUNTER — Ambulatory Visit: Payer: Medicare Other | Admitting: Family Medicine

## 2017-12-14 DIAGNOSIS — H401131 Primary open-angle glaucoma, bilateral, mild stage: Secondary | ICD-10-CM | POA: Diagnosis not present

## 2017-12-14 DIAGNOSIS — H25813 Combined forms of age-related cataract, bilateral: Secondary | ICD-10-CM | POA: Diagnosis not present

## 2017-12-18 DIAGNOSIS — M24571 Contracture, right ankle: Secondary | ICD-10-CM | POA: Diagnosis not present

## 2017-12-18 DIAGNOSIS — M21612 Bunion of left foot: Secondary | ICD-10-CM | POA: Diagnosis not present

## 2017-12-18 DIAGNOSIS — M7661 Achilles tendinitis, right leg: Secondary | ICD-10-CM | POA: Diagnosis not present

## 2017-12-18 DIAGNOSIS — M21611 Bunion of right foot: Secondary | ICD-10-CM | POA: Diagnosis not present

## 2017-12-21 ENCOUNTER — Other Ambulatory Visit: Payer: Self-pay

## 2017-12-21 DIAGNOSIS — D485 Neoplasm of uncertain behavior of skin: Secondary | ICD-10-CM | POA: Diagnosis not present

## 2017-12-21 DIAGNOSIS — L308 Other specified dermatitis: Secondary | ICD-10-CM | POA: Diagnosis not present

## 2017-12-23 DIAGNOSIS — L309 Dermatitis, unspecified: Secondary | ICD-10-CM | POA: Diagnosis not present

## 2018-01-18 ENCOUNTER — Other Ambulatory Visit: Payer: Self-pay | Admitting: Family Medicine

## 2018-01-29 ENCOUNTER — Encounter

## 2018-01-29 ENCOUNTER — Other Ambulatory Visit: Payer: Self-pay

## 2018-01-29 ENCOUNTER — Ambulatory Visit (INDEPENDENT_AMBULATORY_CARE_PROVIDER_SITE_OTHER): Payer: Medicare Other | Admitting: Gastroenterology

## 2018-01-29 ENCOUNTER — Encounter: Payer: Self-pay | Admitting: Gastroenterology

## 2018-01-29 VITALS — BP 134/90 | HR 94 | Temp 97.3°F | Ht 67.0 in | Wt 123.4 lb

## 2018-01-29 DIAGNOSIS — R933 Abnormal findings on diagnostic imaging of other parts of digestive tract: Secondary | ICD-10-CM | POA: Diagnosis not present

## 2018-01-29 DIAGNOSIS — R1013 Epigastric pain: Secondary | ICD-10-CM

## 2018-01-29 DIAGNOSIS — R110 Nausea: Secondary | ICD-10-CM | POA: Diagnosis not present

## 2018-01-29 DIAGNOSIS — K5909 Other constipation: Secondary | ICD-10-CM | POA: Diagnosis not present

## 2018-01-29 MED ORDER — LINACLOTIDE 290 MCG PO CAPS: 290.0000 ug | ORAL_CAPSULE | Freq: Every day | ORAL | 3 refills | Status: DC

## 2018-01-29 NOTE — Patient Instructions (Signed)
1. Upper endoscopy as scheduled. See separate instructions.  2. Continue Linzess as before.

## 2018-01-29 NOTE — Assessment & Plan Note (Signed)
Continue Linzess as needed only.

## 2018-01-29 NOTE — Assessment & Plan Note (Signed)
Abdominal pain associated with nausea and intermittent vomiting over the past several months.  Less severe the last couple of weeks.  CT in April with questionable gastric wall thickening versus under distention.  Given her ongoing symptoms would again recommend EGD for further evaluation.  I have discussed the risks, alternatives, benefits with regards to but not limited to the risk of reaction to medication, bleeding, infection, perforation and the patient is agreeable to proceed. Written consent to be obtained.

## 2018-01-29 NOTE — Progress Notes (Signed)
Primary Care Physician: Fayrene Helper, MD  Primary Gastroenterologist:  Garfield Cornea, MD   Chief Complaint  Patient presents with  . Abdominal Pain    occ  . Nausea    occ    HPI: Tamara Lam is a 67 y.o. female here for occasional abdominal pain. Last seen in March.  She also has a history of constipation.  When I last saw her in March, she was getting over the flu.  Overall she was returning to normal except for some vague bloating.  She called in a few weeks later complaining of severe abdominal pain associated with vomiting.  CT abdomen and pelvis with contrast in April showed small bilateral nonobstructing renal calculi, somewhat prominent mucosa in the distal antrum of the stomach that portion of the stomach not distended, diverticulosis.  She was offered an EGD at that time or return office visit but patient declined both stating that she was somewhat improved.  Also had stopped pantoprazole because she did not feel like it was helping.  Previously tried omeprazole without relief.  Patient states she continued to have significant symptoms up until about a month ago.  Some days are better than others but continues to have symptoms intermittently.  Epigastric discomfort associated with nausea.  Vomiting and diarrhea resolved.  She was able to come off Linzess couple months ago when she was sick because of the diarrhea.  Now she uses it just when she needs it.  Typically takes a dose 1-2 times per week.  Yesterday she passed some mucus with a little bit of blood present.  Denies melena.  No significant NSAID use but she did take some Advil couple months ago when her stomach was hurting.  No typical heartburn.  No hematemesis.    Her weight has been fluctuating.  In the past 2 years she has been as low as the 115 however in April she was at her all-time high of 130 pounds.  She is down to 7 pounds now but stable since May.   Current Outpatient Medications  Medication Sig  Dispense Refill  . alendronate (FOSAMAX) 70 MG tablet TAKE 1 TABLET ONCE EVERY  WEEK IN THE MORNING 30  MINUTES BEFORE EATING WITH  8 OUNCES OF WATER . SIT UP  FOR 30 MINS AFTER TAKING. 12 tablet 1  . aspirin EC 81 MG tablet Take 81 mg by mouth daily.    . Calcium Carb-Cholecalciferol (CALCIUM 1000 + D) 1000-800 MG-UNIT TABS Take 1 tablet by mouth daily.    . diazepam (VALIUM) 5 MG tablet TAKE 1 TABLET BY MOUTH DAILY AS NEEDED FOR ANXIETY. 15 tablet 0  . hydrOXYzine (ATARAX/VISTARIL) 10 MG tablet One tablet at bedtime as needed, for itch 30 tablet 2  . linaclotide (LINZESS) 290 MCG CAPS capsule Take 1 capsule (290 mcg total) by mouth daily before breakfast. 90 capsule 3  . Multiple Vitamins-Minerals (CENTRUM ADULTS) TABS Take 1 tablet by mouth daily.    . simvastatin (ZOCOR) 20 MG tablet TAKE 1 TABLET BY MOUTH AT  BEDTIME FOR CHOLESTEROL 90 tablet 1  . travoprost, benzalkonium, (TRAVATAN) 0.004 % ophthalmic solution Place 1 drop into both eyes at bedtime.      . vitamin C (ASCORBIC ACID) 500 MG tablet Take 500 mg by mouth daily.     No current facility-administered medications for this visit.     Allergies as of 01/29/2018  . (No Known Allergies)   Past Medical History:  Diagnosis  Date  . Abnormal CXR (chest x-ray)    Evaluated with Pulmonary- Dr. Luan Pulling- "pt. was told probably due to thin body frame"-saw no issues-PFT test normal.  . Anxiety 1980's   . Arthritis    osteoarthritis -knees, hands  . Chronic constipation    tx. Linzess  . Chronic nausea   . Diabetes mellitus without complication ([place])    W0J level checked borderline x1, then further evaluated -Diabetes ruled out.  . Glaucoma 2004   Dr. Venetia Maxon, laser to left eye Sept 2011, bilateral eye drops for this  . Helicobacter pylori gastritis 04/2004   last EGD   . Hematochezia   . Hx of colonoscopy 08/2005   Dr. Gala Romney / hemorrhoids   . Thyroid disease    thyroid goiter, nodules-no problems.  . Weight loss    Past  Surgical History:  Procedure Laterality Date  . ABDOMINAL HYSTERECTOMY    . arthoscopic surgery on right knee  2007  . CHOLECYSTECTOMY  1990's  . COLONOSCOPY  2011   Dr. Gala Romney: anal canal/internal hemorrhoids, dimintuive rectosigmoid polyp (polypoid rectal mucosa), few sigmoid diverticula   . COLONOSCOPY N/A 10/30/2015   Dr. Gala Romney: diverticulosis, hemorrhoids, surveillance in 2022   . TOTAL KNEE ARTHROPLASTY Right 05/26/2016   Procedure: RIGHT TOTAL KNEE ARTHROPLASTY;  Surgeon: Gaynelle Arabian, MD;  Location: [facility];  Service: Orthopedics;  Laterality: Right;  Marland Kitchen VESICOVAGINAL FISTULA CLOSURE W/ TAH  1989   Family History  Problem Relation Age of Onset  . Hypertension Mother   . Glaucoma Mother   . Diverticulosis Mother   . Heart disease Mother   . Lung cancer Father   . Mental illness Sister   . Cancer Sister   . Leukemia Brother        some form of leukemia  . Colon polyps Son   . Colon cancer Neg Hx    Social History   Tobacco Use  . Smoking status: Never Smoker  . Smokeless tobacco: Never Used  . Tobacco comment: Never smoked  Substance Use Topics  . Alcohol use: No    Alcohol/week: 0.0 oz  . Drug use: No    ROS:  General: Negative for anorexia, fever, chills, fatigue, weakness.  See HPI ENT: Negative for hoarseness, difficulty swallowing , nasal congestion. CV: Negative for chest pain, angina, palpitations, dyspnea on exertion, peripheral edema.  Respiratory: Negative for dyspnea at rest, dyspnea on exertion, cough, sputum, wheezing.  GI: See history of present illness. GU:  Negative for dysuria, hematuria, urinary incontinence, urinary frequency, nocturnal urination.  Endo: Negative for unusual weight change.    Physical Examination:   BP 134/90   Pulse 94   Temp (!) 97.3 F (36.3 C) (Oral)   Ht 5\' 7"  (1.702 m)   Wt 123 lb 6.4 oz (56 kg)   BMI 19.33 kg/m   General: Well-nourished, well-developed in no acute distress.  Eyes: No icterus. Mouth:  Oropharyngeal mucosa moist and pink , no lesions erythema or exudate. Lungs: Clear to auscultation bilaterally.  Heart: Regular rate and rhythm, no murmurs rubs or gallops.  Abdomen: Bowel sounds are normal, mild epigastric tenderness, nondistended, no hepatosplenomegaly or masses, no abdominal bruits or hernia , no rebound or guarding.   Extremities: No lower extremity edema. No clubbing or deformities. Neuro: Alert and oriented x 4   Skin: Warm and dry, no jaundice.   Psych: Alert and cooperative, normal mood and affect.  Labs:  Lab Results  Component Value Date   CREATININE 0.70  10/13/2017   BUN 12 07/28/2017   NA 143 07/28/2017   K 4.0 07/28/2017   CL 102 07/28/2017   CO2 25 07/28/2017   Lab Results  Component Value Date   ALT 16 04/09/2017   AST 24 04/09/2017   ALKPHOS 44 04/09/2017   BILITOT 0.5 04/09/2017   Lab Results  Component Value Date   WBC 8.4 07/28/2017   HGB 13.1 07/28/2017   HCT 40.5 07/28/2017   MCV 87 07/28/2017   PLT 248 07/28/2017   Lab Results  Component Value Date   TSH 0.712 08/15/2017   Lab Results  Component Value Date   HGBA1C 5.6 05/05/2016

## 2018-02-01 NOTE — Progress Notes (Signed)
cc'ed to pcp °

## 2018-02-19 ENCOUNTER — Telehealth: Payer: Self-pay | Admitting: Family Medicine

## 2018-02-19 DIAGNOSIS — E559 Vitamin D deficiency, unspecified: Secondary | ICD-10-CM

## 2018-02-19 DIAGNOSIS — E785 Hyperlipidemia, unspecified: Secondary | ICD-10-CM

## 2018-02-19 NOTE — Addendum Note (Signed)
Addended by: Eual Fines on: 02/19/2018 02:24 PM   Modules accepted: Orders

## 2018-02-19 NOTE — Telephone Encounter (Signed)
pls order fasting lipid, cmp and Egfr and vit D level

## 2018-02-19 NOTE — Telephone Encounter (Signed)
Patient requesting lab order for next office visit [phone]/ [phone]

## 2018-02-20 DIAGNOSIS — E559 Vitamin D deficiency, unspecified: Secondary | ICD-10-CM | POA: Diagnosis not present

## 2018-02-20 DIAGNOSIS — E785 Hyperlipidemia, unspecified: Secondary | ICD-10-CM | POA: Diagnosis not present

## 2018-02-23 LAB — CMP14+EGFR
ALT: 16 IU/L (ref 0–32)
AST: 20 IU/L (ref 0–40)
Albumin/Globulin Ratio: 1.7 (ref 1.2–2.2)
Albumin: 4.5 g/dL (ref 3.6–4.8)
Alkaline Phosphatase: 40 IU/L (ref 39–117)
BUN/Creatinine Ratio: 16 (ref 12–28)
BUN: 13 mg/dL (ref 8–27)
Bilirubin Total: 0.4 mg/dL (ref 0.0–1.2)
CALCIUM: 9.7 mg/dL (ref 8.7–10.3)
CO2: 28 mmol/L (ref 20–29)
CREATININE: 0.79 mg/dL (ref 0.57–1.00)
Chloride: 101 mmol/L (ref 96–106)
GFR calc Af Amer: 90 mL/min/{1.73_m2} (ref >59)
GFR, EST NON AFRICAN AMERICAN: 78 mL/min/{1.73_m2} (ref >59)
GLUCOSE: 100 mg/dL — AB (ref 65–99)
Globulin, Total: 2.6 g/dL (ref 1.5–4.5)
Potassium: 4 mmol/L (ref 3.5–5.2)
Sodium: 142 mmol/L (ref 134–144)
Total Protein: 7.1 g/dL (ref 6.0–8.5)

## 2018-02-23 LAB — LIPID PANEL
CHOL/HDL RATIO: 2.6 ratio (ref 0.0–4.4)
CHOLESTEROL TOTAL: 172 mg/dL (ref 100–199)
HDL: 66 mg/dL (ref >39)
LDL CALC: 93 mg/dL (ref 0–99)
Triglycerides: 63 mg/dL (ref 0–149)
VLDL Cholesterol Cal: 13 mg/dL (ref 5–40)

## 2018-02-23 LAB — VITAMIN D 25 HYDROXY (VIT D DEFICIENCY, FRACTURES): Vit D, 25-Hydroxy: 48.4 ng/mL (ref 30.0–100.0)

## 2018-02-24 ENCOUNTER — Encounter: Payer: Self-pay | Admitting: Family Medicine

## 2018-02-24 ENCOUNTER — Ambulatory Visit (INDEPENDENT_AMBULATORY_CARE_PROVIDER_SITE_OTHER): Payer: Medicare Other | Admitting: Family Medicine

## 2018-02-24 VITALS — BP 134/80 | HR 102 | Resp 16 | Ht 67.0 in | Wt 123.0 lb

## 2018-02-24 DIAGNOSIS — N3001 Acute cystitis with hematuria: Secondary | ICD-10-CM

## 2018-02-24 DIAGNOSIS — R350 Frequency of micturition: Secondary | ICD-10-CM | POA: Diagnosis not present

## 2018-02-24 DIAGNOSIS — Z23 Encounter for immunization: Secondary | ICD-10-CM

## 2018-02-24 DIAGNOSIS — R3912 Poor urinary stream: Secondary | ICD-10-CM

## 2018-02-24 DIAGNOSIS — E042 Nontoxic multinodular goiter: Secondary | ICD-10-CM

## 2018-02-24 DIAGNOSIS — J302 Other seasonal allergic rhinitis: Secondary | ICD-10-CM

## 2018-02-24 DIAGNOSIS — F411 Generalized anxiety disorder: Secondary | ICD-10-CM

## 2018-02-24 DIAGNOSIS — R300 Dysuria: Secondary | ICD-10-CM

## 2018-02-24 LAB — POCT URINALYSIS DIPSTICK
APPEARANCE: NORMAL
BILIRUBIN UA: NEGATIVE
GLUCOSE UA: NEGATIVE
Ketones, UA: NEGATIVE
LEUKOCYTES UA: NEGATIVE
Nitrite, UA: NEGATIVE
Odor: NORMAL
PH UA: 5.5 (ref 5.0–8.0)
Protein, UA: NEGATIVE
Spec Grav, UA: 1.02 (ref 1.010–1.025)
Urobilinogen, UA: 0.2 E.U./dL

## 2018-02-24 MED ORDER — DIAZEPAM 5 MG PO TABS: ORAL_TABLET | ORAL | 1 refills | Status: DC

## 2018-02-24 MED ORDER — MONTELUKAST SODIUM 10 MG PO TABS: 10.0000 mg | ORAL_TABLET | Freq: Every day | ORAL | 3 refills | Status: DC

## 2018-02-24 NOTE — Progress Notes (Signed)
   KADEY MIHALIC     MRN: [id number]      DOB: 1950-08-20   HPI Ms. Ferran is here for follow up and re-evaluation of chronic medical conditions, medication management and review of any available recent lab and radiology data.  Preventive health is updated, specifically  Cancer screening and Immunization.   Questions or concerns regarding consultations or procedures which the PT has had in the interim are  addressed. The PT denies any adverse reactions to current medications since the last visit.  C/o feeling sick all year current  concerns tickle in her throat and runny nose , clear  X months, no commitment to daily allergy meds, but now agrees to start this and see how she responds before gaping to an allergist or pulmonary specialist Chronic abdominal pain and weight loss is being dealt with by GI and she has upcoming endoscopy.  Several week s ago left flank pain with movement esp when attempting  to change position C/o feeling of incomplete emptying with urination and mild discomfort ROS  Denies chest congestion, productive cough or wheezing. Denies chest pains, palpitations and leg swelling  Denies dysuria, frequency, hesitancy or incontinence. Denies joint pain, swelling  Denies headaches, seizures, numbness, or tingling. Denies depression, does have chronic anxiety and mild r insomnia. Denies skin break down or rash.   PE  BP 134/80   Pulse (!) 102   Resp 16   Ht 5\' 7"  (1.702 m)   Wt 123 lb (55.8 kg)   SpO2 100%   BMI 19.26 kg/m   Patient alert and oriented and in no cardiopulmonary distress.  HEENT: No facial asymmetry, EOMI,   oropharynx pink and moist.  Neck supple no JVD, no mass.  Chest: Clear to auscultation bilaterally.  CVS: S1, S2 no murmurs, no S3.Regular rate.  ABD: Soft no renal angle tenderness,  Mild suprapubic tenderness Ext: No edema  MS: Adequate ROM spine, shoulders, hips and knees.  Skin: Intact, no ulcerations or rash noted.  Psych: Good  eye contact, normal affect. Memory intact mildly anxious or depressed appearing.  CNS: CN 2-12 intact, power,  normal throughout.no focal deficits noted.   Assessment & Plan  Allergic rhinitis Uncontrolled with chronic initialing symptoms . Educated re the need to commit to daily medication for allergy control, if no relief in next 3 to 4 weeks , swill refer to Pulmonary/ Allergist for further eval, she agrees to call in  Acute cystitis with hematuria Urinary frequency with incomplete emptying and pain. CCUA is abnormal and positive for blood, she does have h/o small kidney stones, but because of her other symptoms , since c/s is negative for infection , she is referred to [facility]  Anxiety state Encouraged to use behavior modification to manage anxiety, and insomnia and to rely less on medication  Nontoxic multinodular goiter Managed by endo and stable  Need for immunization against influenza After obtaining informed consent, the vaccine is  administered by LPN.

## 2018-02-24 NOTE — Patient Instructions (Signed)
Annual physical exam December 13 or after , call if you need me sooner  Flu vaccine today  Urine has trace blood, you do have small kidney stones  If you do not test positive for a bladder infection ( then I am referring you to a bladder Specialist for further testing, Hopefully we will know by the end of the week, it may not be before next week however, we will call when we know  Pain with standing is from arthritis in your hip.  Please commit to once daily tablets for allergies, singul;air and stop using cough drops, if you are still having excess symptoms after 1 month, you just need to call to request referral to a lung Specialist  Hope you feel better soon Thank you  for choosing [facility]. We consider it a privelige to serve you.  Delivering excellent health care in a caring and  compassionate way is our goal.  Partnering with you,  so that together we can achieve this goal is our strategy.

## 2018-02-25 DIAGNOSIS — N3001 Acute cystitis with hematuria: Secondary | ICD-10-CM | POA: Diagnosis not present

## 2018-02-26 LAB — URINE CULTURE
MICRO NUMBER: 91037334
Result:: NO GROWTH
SPECIMEN QUALITY:: ADEQUATE

## 2018-02-28 ENCOUNTER — Encounter: Payer: Self-pay | Admitting: Family Medicine

## 2018-03-01 ENCOUNTER — Telehealth: Payer: Self-pay | Admitting: Family Medicine

## 2018-03-01 NOTE — Assessment & Plan Note (Signed)
Encouraged to use behavior modification to manage anxiety, and insomnia and to rely less on medication

## 2018-03-01 NOTE — Assessment & Plan Note (Signed)
After obtaining informed consent, the vaccine is  administered by LPN.  

## 2018-03-01 NOTE — Assessment & Plan Note (Signed)
Uncontrolled with chronic initialing symptoms . Educated re the need to commit to daily medication for allergy control, if no relief in next 3 to 4 weeks , swill refer to Pulmonary/ Allergist for further eval, she agrees to call in

## 2018-03-01 NOTE — Assessment & Plan Note (Signed)
Urinary frequency with incomplete emptying and pain. CCUA is abnormal and positive for blood, she does have h/o small kidney stones, but because of her other symptoms , since c/s is negative for infection , she is referred to [facility]

## 2018-03-01 NOTE — Assessment & Plan Note (Signed)
Managed by endo and stable 

## 2018-03-01 NOTE — Telephone Encounter (Signed)
Pl call pt and let her  Know that her urine was not infected. Because of her symptoms and the fact that there was blood in the urine, as discussed at the visit, I have referred her to urology in [place] to further evaluate her urinary tract I have entered the referral, she just needs o be informed

## 2018-03-02 ENCOUNTER — Telehealth: Payer: Self-pay

## 2018-03-02 NOTE — Telephone Encounter (Signed)
Melanie at [place] spoke to pt, she is unable to arrive earlier for procedure.

## 2018-03-02 NOTE — Telephone Encounter (Signed)
Spoke with patient and advised of lab results with verbal understanding. She understands that once that the referral is worked someone will call her with the appointment information.

## 2018-03-02 NOTE — Telephone Encounter (Signed)
Tried to call pt, to see if she can arrive earlier for EGD 03/03/18. No answer, LMOVM and LMOAM for return call.

## 2018-03-03 ENCOUNTER — Encounter (HOSPITAL_COMMUNITY)
Admission: RE | Disposition: A | Discharge status: Home / Self Care | Payer: Self-pay | Source: Ambulatory Visit | Attending: Internal Medicine

## 2018-03-03 ENCOUNTER — Ambulatory Visit (HOSPITAL_COMMUNITY)
Admission: RE | Admit: 2018-03-03 | Discharge: 2018-03-03 | Disposition: A | Discharge status: Home / Self Care | Payer: Medicare Other | Source: Ambulatory Visit | Attending: Internal Medicine | Admitting: Internal Medicine

## 2018-03-03 ENCOUNTER — Other Ambulatory Visit: Payer: Self-pay

## 2018-03-03 ENCOUNTER — Encounter (HOSPITAL_COMMUNITY): Payer: Self-pay

## 2018-03-03 DIAGNOSIS — F419 Anxiety disorder, unspecified: Secondary | ICD-10-CM | POA: Diagnosis not present

## 2018-03-03 DIAGNOSIS — R933 Abnormal findings on diagnostic imaging of other parts of digestive tract: Secondary | ICD-10-CM

## 2018-03-03 DIAGNOSIS — K2950 Unspecified chronic gastritis without bleeding: Secondary | ICD-10-CM | POA: Insufficient documentation

## 2018-03-03 DIAGNOSIS — K5909 Other constipation: Secondary | ICD-10-CM | POA: Insufficient documentation

## 2018-03-03 DIAGNOSIS — R1013 Epigastric pain: Secondary | ICD-10-CM | POA: Diagnosis not present

## 2018-03-03 DIAGNOSIS — M1712 Unilateral primary osteoarthritis, left knee: Secondary | ICD-10-CM | POA: Insufficient documentation

## 2018-03-03 DIAGNOSIS — M19041 Primary osteoarthritis, right hand: Secondary | ICD-10-CM | POA: Diagnosis not present

## 2018-03-03 DIAGNOSIS — E119 Type 2 diabetes mellitus without complications: Secondary | ICD-10-CM | POA: Diagnosis not present

## 2018-03-03 DIAGNOSIS — M19042 Primary osteoarthritis, left hand: Secondary | ICD-10-CM | POA: Insufficient documentation

## 2018-03-03 DIAGNOSIS — K3189 Other diseases of stomach and duodenum: Secondary | ICD-10-CM | POA: Insufficient documentation

## 2018-03-03 DIAGNOSIS — Z8249 Family history of ischemic heart disease and other diseases of the circulatory system: Secondary | ICD-10-CM | POA: Diagnosis not present

## 2018-03-03 DIAGNOSIS — Z79899 Other long term (current) drug therapy: Secondary | ICD-10-CM | POA: Insufficient documentation

## 2018-03-03 DIAGNOSIS — R110 Nausea: Secondary | ICD-10-CM

## 2018-03-03 DIAGNOSIS — Z7982 Long term (current) use of aspirin: Secondary | ICD-10-CM | POA: Diagnosis not present

## 2018-03-03 DIAGNOSIS — Z96651 Presence of right artificial knee joint: Secondary | ICD-10-CM | POA: Insufficient documentation

## 2018-03-03 HISTORY — PX: ESOPHAGOGASTRODUODENOSCOPY: SHX5428

## 2018-03-03 HISTORY — PX: BIOPSY: SHX5522

## 2018-03-03 SURGERY — EGD (ESOPHAGOGASTRODUODENOSCOPY)
Anesthesia: Moderate Sedation

## 2018-03-03 MED ORDER — LIDOCAINE VISCOUS HCL 2 % MT SOLN
OROMUCOSAL | Status: DC | PRN
  Administered 2018-03-03: 1 via OROMUCOSAL

## 2018-03-03 MED ORDER — STERILE WATER FOR IRRIGATION IR SOLN
Status: DC | PRN
  Administered 2018-03-03: 1.5 mL

## 2018-03-03 MED ORDER — ONDANSETRON HCL 4 MG/2ML IJ SOLN
INTRAMUSCULAR | Status: AC
  Filled 2018-03-03: qty 2

## 2018-03-03 MED ORDER — MIDAZOLAM HCL 5 MG/5ML IJ SOLN
INTRAMUSCULAR | Status: AC
  Filled 2018-03-03: qty 5

## 2018-03-03 MED ORDER — MEPERIDINE HCL 50 MG/ML IJ SOLN
INTRAMUSCULAR | Status: AC
  Filled 2018-03-03: qty 1

## 2018-03-03 MED ORDER — ONDANSETRON HCL 4 MG/2ML IJ SOLN
INTRAMUSCULAR | Status: DC | PRN
  Administered 2018-03-03: 4 mg via INTRAVENOUS

## 2018-03-03 MED ORDER — LIDOCAINE VISCOUS HCL 2 % MT SOLN
OROMUCOSAL | Status: AC
  Filled 2018-03-03: qty 15

## 2018-03-03 MED ORDER — SODIUM CHLORIDE 0.9 % IV SOLN
INTRAVENOUS | Status: DC
  Administered 2018-03-03: 11:00:00 via INTRAVENOUS

## 2018-03-03 MED ORDER — MEPERIDINE HCL 100 MG/ML IJ SOLN
INTRAMUSCULAR | Status: DC | PRN
  Administered 2018-03-03: 25 mg via INTRAVENOUS

## 2018-03-03 MED ORDER — MIDAZOLAM HCL 5 MG/5ML IJ SOLN
INTRAMUSCULAR | Status: DC | PRN
  Administered 2018-03-03 (×2): 2 mg via INTRAVENOUS

## 2018-03-03 NOTE — Discharge Instructions (Signed)

## 2018-03-03 NOTE — Op Note (Signed)
[facility] Patient Name: Tamara Lam Procedure Date: 03/03/2018 11:07 AM MRN: [id number] Date of Birth: 1950/07/14 Attending MD: Norvel Richards , MD CSN: [id number] Age: 67 Admit Type: Outpatient Procedure:                Upper GI endoscopy Indications:              Dyspepsia, Abnormal CT of the GI tract Providers:                Norvel Richards, MD, Charlsie Quest. Theda Sers RN, RN,                            Aram Candela Referring MD:              Medicines:                Midazolam 4 mg IV, Meperidine 25 mg IV Complications:            No immediate complications. Estimated Blood Loss:     Estimated blood loss was minimal. Procedure:                Pre-Anesthesia Assessment:                           - Prior to the procedure, a History and Physical                            was performed, and patient medications and                            allergies were reviewed. The patient's tolerance of                            previous anesthesia was also reviewed. The risks                            and benefits of the procedure and the sedation                            options and risks were discussed with the patient.                            All questions were answered, and informed consent                            was obtained. Prior Anticoagulants: The patient has                            taken no previous anticoagulant or antiplatelet                            agents. ASA Grade Assessment: II - A patient with                            mild systemic disease. After reviewing the risks  and benefits, the patient was deemed in                            satisfactory condition to undergo the procedure.                           After obtaining informed consent, the endoscope was                            passed under direct vision. Throughout the                            procedure, the patient's blood pressure, pulse, and              oxygen saturations were monitored continuously. The                            [id number]-H190 ([id number]) scope was introduced through the                            mouth, and advanced to the second part of duodenum.                            The patient tolerated the procedure well. Scope In: 11:44:09 AM Scope Out: 11:48:59 AM Total Procedure Duration: 0 hours 4 minutes 50 seconds  Findings:      The examined esophagus was normal.      Localized mucosal changes were found in the stomach. scattered antral       erosions and erythema. Certainly, no evidence of infiltrating process or       ulcer. This was biopsied with a cold forceps for histology. Estimated       blood loss was minimal.      The duodenal bulb and second portion of the duodenum were normal. Impression:               - Normal esophagus.                           - Mucosal changes in the stomach of uncertain                            significance. Biopsied.                           - Normal duodenal bulb and second portion of the                            duodenum. Moderate Sedation:      Moderate (conscious) sedation was administered by the endoscopy nurse       and supervised by the endoscopist. The following parameters were       monitored: oxygen saturation, heart rate, blood pressure, respiratory       rate, EKG, adequacy of pulmonary ventilation, and response to care.       Total physician intraservice time was 13 minutes. Recommendation:           - Patient has a contact number  available for                            emergencies. The signs and symptoms of potential                            delayed complications were discussed with the                            patient. Return to normal activities tomorrow.                            Written discharge instructions were provided to the                            patient.                           - Resume previous diet.                           -  Continue present medications.                           - Await pathology results.                           - No repeat upper endoscopy.                           - Return to GI office (date not yet determined). Procedure Code(s):        --- Professional ---                           [phone], Esophagogastroduodenoscopy, flexible,                            transoral; with biopsy, single or multiple                           G0500, Moderate sedation services provided by the                            same physician or other qualified health care                            professional performing a gastrointestinal                            endoscopic service that sedation supports,                            requiring the presence of an independent trained                            observer to assist in the monitoring of the  patient's level of consciousness and physiological                            status; initial 15 minutes of intra-service time;                            patient age 74 years or older (additional time may                            be reported with [phone], as appropriate) Diagnosis Code(s):        --- Professional ---                           K31.89, Other diseases of stomach and duodenum                           R10.13, Epigastric pain                           R93.3, Abnormal findings on diagnostic imaging of                            other parts of digestive tract CPT copyright 2017 American Medical Association. All rights reserved. The codes documented in this report are preliminary and upon coder review may  be revised to meet current compliance requirements. Cristopher Estimable. Rourk, MD Norvel Richards, MD 03/03/2018 12:00:13 PM This report has been signed electronically. Number of Addenda: 0

## 2018-03-03 NOTE — H&P (Signed)
@LOGO @   Primary Care Physician:  Fayrene Helper, MD Primary Gastroenterologist:  Dr. Gala Romney  Pre-Procedure History & Physical: HPI:  Tamara Lam is a 67 y.o. female here for for further evaluation of dyspepsia and abnormal antrum on CT. No dysphagia. No improvement with Protonix or omeprazole. Gallbladder out.  Stop Fosamax for 2 weeks-could tell no difference; resumed taking it.  BMs good on Linzess.  Past Medical History:  Diagnosis Date  . Abnormal CXR (chest x-ray)    Evaluated with Pulmonary- Dr. Luan Pulling- "pt. was told probably due to thin body frame"-saw no issues-PFT test normal.  . Anxiety 1980's   . Arthritis    osteoarthritis -knees, hands  . Chronic constipation    tx. Linzess  . Chronic nausea   . Diabetes mellitus without complication ([place])    E9H level checked borderline x1, then further evaluated -Diabetes ruled out.  . Glaucoma 2004   Dr. Venetia Maxon, laser to left eye Sept 2011, bilateral eye drops for this  . Helicobacter pylori gastritis 04/2004   last EGD   . Hematochezia   . Hx of colonoscopy 08/2005   Dr. Gala Romney / hemorrhoids   . Thyroid disease    thyroid goiter, nodules-no problems.  . Weight loss     Past Surgical History:  Procedure Laterality Date  . ABDOMINAL HYSTERECTOMY    . arthoscopic surgery on right knee  2007  . CHOLECYSTECTOMY  1990's  . COLONOSCOPY  2011   Dr. Gala Romney: anal canal/internal hemorrhoids, dimintuive rectosigmoid polyp (polypoid rectal mucosa), few sigmoid diverticula   . COLONOSCOPY N/A 10/30/2015   Dr. Gala Romney: diverticulosis, hemorrhoids, surveillance in 2022   . ESOPHAGOGASTRODUODENOSCOPY  2005   Dr. Gala Romney: diffuse submucosal petechial hemorrhage of te gastric mucosa, some noncompliance of gastric cavity, failure to insufflate fully, multiple gastric biopsies. gastric bx, h.pylori  . TOTAL KNEE ARTHROPLASTY Right 05/26/2016   Procedure: RIGHT TOTAL KNEE ARTHROPLASTY;  Surgeon: Gaynelle Arabian, MD;  Location: [facility];   Service: Orthopedics;  Laterality: Right;  Marland Kitchen VESICOVAGINAL FISTULA CLOSURE W/ TAH  1989    Prior to Admission medications   Medication Sig Start Date End Date Taking? Authorizing Provider  alendronate (FOSAMAX) 70 MG tablet TAKE 1 TABLET ONCE EVERY  WEEK IN THE MORNING 30  MINUTES BEFORE EATING WITH  8 OUNCES OF WATER . SIT UP  FOR 30 MINS AFTER TAKING. Patient taking differently: Take 70 mg by mouth once a week. On Wednesdays 10/19/17  Yes Fayrene Helper, MD  aspirin EC 81 MG tablet Take 81 mg by mouth daily.   Yes [provider]  Calcium-Magnesium-Vitamin D (CALCIUM 1200+D3 PO) Take 1 tablet by mouth daily.   Yes [provider]  diazepam (VALIUM) 5 MG tablet One tablet at bedtime as needed, for anxiety 02/24/18  Yes Fayrene Helper, MD  linaclotide [facility]) 290 MCG CAPS capsule Take 1 capsule (290 mcg total) by mouth daily before breakfast. 01/29/18  Yes Mahala Menghini, PA-C  montelukast (SINGULAIR) 10 MG tablet Take 1 tablet (10 mg total) by mouth at bedtime. 02/24/18  Yes Fayrene Helper, MD  Multiple Vitamins-Minerals (CENTRUM ADULTS) TABS Take 1 tablet by mouth daily.   Yes [provider]  simvastatin (ZOCOR) 20 MG tablet TAKE 1 TABLET BY MOUTH AT  BEDTIME FOR CHOLESTEROL Patient taking differently: Take 20 mg by mouth at bedtime.  05/11/17  Yes Fayrene Helper, MD  travoprost, benzalkonium, (TRAVATAN) 0.004 % ophthalmic solution Place 1 drop into both eyes at  bedtime.     Yes [provider]  vitamin C (ASCORBIC ACID) 500 MG tablet Take 500 mg by mouth daily.   Yes [provider]    Allergies as of 01/29/2018  . (No Known Allergies)    Family History  Problem Relation Age of Onset  . Hypertension Mother   . Glaucoma Mother   . Diverticulosis Mother   . Heart disease Mother   . Lung cancer Father   . Mental illness Sister   . Cancer Sister   . Leukemia Brother        some form of leukemia  . Colon polyps Son   .  Colon cancer Neg Hx     Social History   Socioeconomic History  . Marital status: Widowed    Spouse name: Not on file  . Number of children: Not on file  . Years of education: Not on file  . Highest education level: Not on file  Occupational History  . Occupation: homemaker     Employer: UNEMPLOYED  Social Needs  . Financial resource strain: Not hard at all  . Food insecurity:    Worry: Never true    Inability: Never true  . Transportation needs:    Medical: No    Non-medical: No  Tobacco Use  . Smoking status: Never Smoker  . Smokeless tobacco: Never Used  . Tobacco comment: Never smoked  Substance and Sexual Activity  . Alcohol use: No    Alcohol/week: 0.0 standard drinks  . Drug use: No  . Sexual activity: Not Currently  Lifestyle  . Physical activity:    Days per week: 4 days    Minutes per session: 50 min  . Stress: Only a little  Relationships  . Social connections:    Talks on phone: More than three times a week    Gets together: Once a week    Attends religious service: More than 4 times per year    Active member of club or organization: Yes    Attends meetings of clubs or organizations: More than 4 times per year    Relationship status: Widowed  . Intimate partner violence:    Fear of current or ex partner: No    Emotionally abused: No    Physically abused: No    Forced sexual activity: No  Other Topics Concern  . Not on file  Social History Narrative  . Not on file    Review of Systems: See HPI, otherwise negative ROS  Physical Exam: There were no vitals taken for this visit. General:   Alert,  Well-developed, well-nourished, pleasant and cooperative in NAD Neck:  Supple; no masses or thyromegaly. No significant cervical adenopathy. Lungs:  Clear throughout to auscultation.   No wheezes, crackles, or rhonchi. No acute distress. Heart:  Regular rate and rhythm; no murmurs, clicks, rubs,  or gallops. Abdomen: Non-distended, normal bowel sounds.   Soft and nontender without appreciable mass or hepatosplenomegaly.  Pulses:  Normal pulses noted. Extremities:  Without clubbing or edema.  Impression/Plan:  Pleasant 67 year old lady with dyspepsia and abnormal antrum on CT. Diagnostic EGD now being performed.  The risks, benefits, limitations, alternatives and imponderables have been reviewed with the patient. Potential for esophageal dilation, biopsy, etc. have also been reviewed.  Questions have been answered. All parties agreeable.     Notice: This dictation was prepared with Dragon dictation along with smaller phrase technology. Any transcriptional errors that result from this process are unintentional and may not be  corrected upon review.

## 2018-03-07 ENCOUNTER — Encounter: Payer: Self-pay | Admitting: Internal Medicine

## 2018-03-08 ENCOUNTER — Encounter (HOSPITAL_COMMUNITY): Payer: Self-pay | Admitting: Internal Medicine

## 2018-03-08 ENCOUNTER — Other Ambulatory Visit: Payer: Self-pay | Admitting: Family Medicine

## 2018-03-23 ENCOUNTER — Encounter: Payer: Self-pay | Admitting: Internal Medicine

## 2018-03-23 ENCOUNTER — Telehealth: Payer: Self-pay

## 2018-03-23 NOTE — Telephone Encounter (Signed)
Per RMR- Send letter to patient.  Send copy of letter with path to referring provider and PCP.   Let offer ov in 2 mos if not already scheduled

## 2018-03-23 NOTE — Telephone Encounter (Signed)
PATIENT SCHEDULED AND LETTER SENT  °

## 2018-04-23 ENCOUNTER — Other Ambulatory Visit: Payer: Self-pay

## 2018-04-23 NOTE — Patient Outreach (Signed)
[facility]) Care Management  04/23/2018  Tamara Lam 05-08-1951 [id number]   Medication Adherence call to Mrs. Delbert Vu left a message for patient to call back patient is due on Simvastatin 20 mg. Mrs. leano is showing past due under [place].   [facility] Management Direct Dial [phone]  Fax [phone] [email or web address]

## 2018-04-27 DIAGNOSIS — H25813 Combined forms of age-related cataract, bilateral: Secondary | ICD-10-CM | POA: Diagnosis not present

## 2018-04-27 DIAGNOSIS — H401131 Primary open-angle glaucoma, bilateral, mild stage: Secondary | ICD-10-CM | POA: Diagnosis not present

## 2018-05-21 ENCOUNTER — Ambulatory Visit: Payer: Medicare Other | Admitting: Urology

## 2018-05-21 ENCOUNTER — Other Ambulatory Visit (HOSPITAL_COMMUNITY)
Admission: RE | Admit: 2018-05-21 | Discharge: 2018-05-21 | Disposition: A | Discharge status: Home / Self Care | Payer: Medicare Other | Source: Ambulatory Visit | Attending: Urology | Admitting: Urology

## 2018-05-21 DIAGNOSIS — N200 Calculus of kidney: Secondary | ICD-10-CM | POA: Diagnosis not present

## 2018-05-21 DIAGNOSIS — R3121 Asymptomatic microscopic hematuria: Secondary | ICD-10-CM | POA: Insufficient documentation

## 2018-05-21 LAB — URINALYSIS, COMPLETE (UACMP) WITH MICROSCOPIC
BACTERIA UA: NONE SEEN
BILIRUBIN URINE: NEGATIVE
Glucose, UA: NEGATIVE mg/dL
Hgb urine dipstick: NEGATIVE
Ketones, ur: 80 mg/dL — AB
Leukocytes, UA: NEGATIVE
Nitrite: NEGATIVE
PROTEIN: NEGATIVE mg/dL
SPECIFIC GRAVITY, URINE: 1.023 (ref 1.005–1.030)
pH: 5 (ref 5.0–8.0)

## 2018-06-15 ENCOUNTER — Encounter: Payer: Self-pay | Admitting: Family Medicine

## 2018-06-15 ENCOUNTER — Ambulatory Visit (INDEPENDENT_AMBULATORY_CARE_PROVIDER_SITE_OTHER): Payer: Medicare Other | Admitting: Family Medicine

## 2018-06-15 VITALS — BP 104/74 | HR 88 | Resp 11 | Ht 67.0 in | Wt 126.0 lb

## 2018-06-15 DIAGNOSIS — Z0000 Encounter for general adult medical examination without abnormal findings: Secondary | ICD-10-CM | POA: Diagnosis not present

## 2018-06-15 DIAGNOSIS — R933 Abnormal findings on diagnostic imaging of other parts of digestive tract: Secondary | ICD-10-CM | POA: Diagnosis not present

## 2018-06-15 DIAGNOSIS — F332 Major depressive disorder, recurrent severe without psychotic features: Secondary | ICD-10-CM

## 2018-06-15 DIAGNOSIS — R1030 Lower abdominal pain, unspecified: Secondary | ICD-10-CM | POA: Diagnosis not present

## 2018-06-15 DIAGNOSIS — L299 Pruritus, unspecified: Secondary | ICD-10-CM

## 2018-06-15 DIAGNOSIS — F339 Major depressive disorder, recurrent, unspecified: Secondary | ICD-10-CM | POA: Insufficient documentation

## 2018-06-15 MED ORDER — HYDROXYZINE HCL 10 MG PO TABS: ORAL_TABLET | ORAL | 4 refills | Status: DC

## 2018-06-15 MED ORDER — SERTRALINE HCL 25 MG PO TABS: 25.0000 mg | ORAL_TABLET | Freq: Every day | ORAL | 4 refills | Status: DC

## 2018-06-15 NOTE — Patient Instructions (Signed)
F/u in 8 to 10 weeks, call if you need me sooner  You are referred to therapist P Bynum, and 2 medications are prescribed. Valium is discontinued  Please get CBC, fasting lipid, cmp and eGFR 1  Week before follow up  Best wishes and improvement in health for 2020! Thank you  for choosing [facility]. We consider it a privelige to serve you.  Delivering excellent health care in a caring and  compassionate way is our goal.  Partnering with you,  so that together we can achieve this goal is our strategy.

## 2018-06-15 NOTE — Assessment & Plan Note (Signed)
Annual exam as documented. Counseling done  re healthy lifestyle involving commitment to 150 minutes exercise per week, heart healthy diet, and attaining healthy weight.The importance of adequate sleep also discussed. Regular seat belt use and home safety, is also discussed.  Immunization and cancer screening needs are specifically addressed at this visit.  

## 2018-06-15 NOTE — Assessment & Plan Note (Signed)
Worse at bedtime, start bedtime hydroxyzine low dose at bedtime

## 2018-06-15 NOTE — Assessment & Plan Note (Addendum)
Apart from superficial abdominal tenderness, no palpable mass or abnormality. Recent abdominal ,CT scan reviewed with patient and shows diverticulosis only, she is reassured

## 2018-06-15 NOTE — Assessment & Plan Note (Signed)
Start sertraline and hydroxyzine and refer to therapist

## 2018-06-15 NOTE — Progress Notes (Signed)
    Tamara Lam     MRN: [id number]      DOB: [birth year]  HPI: Patient is in for annual physical exam. C/o depression, not suicidal or homicidal. C/o lower abdominal pain, left more than right, no change in BM, no blood in stool or black stool Recent labs,  are reviewed. Immunization is reviewed , and  Is up to date   PE: BP 104/74 (BP Location: Left Arm, Patient Position: Sitting, Cuff Size: Normal)   Pulse 88   Resp 11   Ht 5\' 7"  (1.702 m)   Wt 126 lb (57.2 kg)   SpO2 96% Comment: room air  BMI 19.73 kg/m   Pleasant  female, alert and oriented x 3, in no cardio-pulmonary distress. Afebrile. HEENT No facial trauma or asymetry. Sinuses non tender.  Extra occullar muscles intact, pupils equally reactive to light. External ears normal, tympanic membranes clear. Oropharynx moist, no exudate. Neck: supple, no adenopathy,JVD or thyromegaly.No bruits.  Chest: Clear to ascultation bilaterally.No crackles or wheezes. Non tender to palpation  Breast: No asymetry,no masses or lumps. No tenderness. No nipple discharge or inversion. No axillary or supraclavicular adenopathy  Cardiovascular system; Heart sounds normal,  S1 and  S2 ,no S3.  No murmur, or thrill. Apical beat not displaced Peripheral pulses normal.  Abdomen: Soft, tender in lower abdomen, no guarding or rebound, no organomegaly or masses. No bruits. Bowel sounds normal. No guarding, tenderness or rebound.  Adequate pelvic support no  cystocele or rectocele noted Cervix pink and appears healthy, no lesions or ulcerations noted, no discharge noted from os Uterus normal size, no adnexal masses, no cervical motion or adnexal tenderness.   Musculoskeletal exam: Full ROM of spine, hips , shoulders and knees. No deformity ,swelling or crepitus noted. No muscle wasting or atrophy.   Neurologic: Cranial nerves 2 to 12 intact. Power, tone ,sensation and reflexes normal throughout. No disturbance in gait. No  tremor.  Skin: Intact, no ulceration, erythema , scaling or rash noted. Pigmentation normal throughout  Psych; Normal mood and affect. Judgement and concentration normal   Assessment & Plan:  Depression, major, recurrent ([place]) Start sertraline and hydroxyzine and refer to therapist  Annual physical exam Annual exam as documented. Counseling done  re healthy lifestyle involving commitment to 150 minutes exercise per week, heart healthy diet, and attaining healthy weight.The importance of adequate sleep also discussed. Regular seat belt use and home safety, is also discussed. Immunization and cancer screening needs are specifically addressed at this visit.   Pruritus Worse at bedtime, start bedtime hydroxyzine low dose at bedtime  Abdominal pain Apart from superficial abdominal tenderness, no palpable mass or abnormality. Recent abdominal ,CT scan reviewed with patient and shows diverticulosis only, she is reassured

## 2018-06-18 ENCOUNTER — Ambulatory Visit: Payer: Medicare Other | Admitting: Gastroenterology

## 2018-06-21 ENCOUNTER — Ambulatory Visit: Payer: Medicare Other | Admitting: Gastroenterology

## 2018-07-16 ENCOUNTER — Ambulatory Visit: Payer: Medicare Other | Admitting: Gastroenterology

## 2018-07-16 ENCOUNTER — Encounter: Payer: Self-pay | Admitting: Gastroenterology

## 2018-07-16 VITALS — BP 123/84 | HR 74 | Temp 97.0°F | Ht 67.0 in | Wt 126.4 lb

## 2018-07-16 DIAGNOSIS — K5909 Other constipation: Secondary | ICD-10-CM

## 2018-07-16 DIAGNOSIS — K5730 Diverticulosis of large intestine without perforation or abscess without bleeding: Secondary | ICD-10-CM | POA: Diagnosis not present

## 2018-07-16 DIAGNOSIS — R1032 Left lower quadrant pain: Secondary | ICD-10-CM

## 2018-07-16 NOTE — Patient Instructions (Signed)
Continue Linzess as needed.  Keep a calendar documenting your abdominal pain.  Document location of your pain, severity on a scale of 0-10 (0 is no pain, 10 being the worst pain), document your bowel movement each day including consistency of your stool (hard, soft, loose) and number of stools that day, if any particular food seem to contribute to your pain, activity that may contribute to your pain. DROP OFF CALENDAR OF SYMPTOMS AFTER ABOUT 6-8 WEEKS OF DOCUMENTATION.   We will see you back in 6 months but call sooner if needed.

## 2018-07-16 NOTE — Assessment & Plan Note (Signed)
Doing well at this time. Taking Linzess prn only.

## 2018-07-16 NOTE — Progress Notes (Signed)
Primary Care Physician: Fayrene Helper, MD  Primary Gastroenterologist:  Garfield Cornea, MD   Chief Complaint  Patient presents with  . Abdominal Pain    doing ok; pp f/u    HPI: Tamara Lam is a 68 y.o. female here for follow up. Early last year we saw her for severe abdominal pain associated with vomiting. CT abdomen and pelvis with contrast in April showed small bilateral nonobstructing renal calculi, somewhat prominent mucosa in the distal antrum of the stomach that portion of the stomach not distended, diverticulosis.  She was offered an EGD at that time or return office visit but patient declined both stating that she was somewhat improved.  Also had stopped pantoprazole because she did not feel like it was helping.  Previously tried omeprazole without relief.  Due to ongoing symptoms she finally had EGD 02/2018. The examined esophagus was normal.Localized mucosal changes were found in the stomach. scattered antral erosions and erythema. Certainly, no evidence of infiltrating process or ulcer. This was biopsied with a cold forceps for histology.Bx showed mild inflammation and no H.pylori.  Weight is stable.  Patient reports that her abdominal pain that she was having before completely resolved.  She believes it was related to the Fosamax.  Her nausea resolved as well.  PCP is contemplating injectable options for her osteopenia.  She has not been on PPI for quite some time.  Only occasionally has some reflux.  Continues to use Linzess as needed only.  For the most part she has done well with her bowel habits.  Denies any melena or rectal bleeding.  Patient tells me that she continues to have intermittent left mid to left lower quadrant abdominal pain.  She was having it off and on prior to her last CT scan in April 2019.  Symptoms are sporadic.  They do not last long.  She has not been able to determine any associating factors.  She has a history of diverticulosis.  Colonoscopy is  up-to-date, due in 2022.  Current Outpatient Medications  Medication Sig Dispense Refill  . Calcium-Magnesium-Vitamin D (CALCIUM 1200+D3 PO) Take 1 tablet by mouth daily.    . diazepam (VALIUM) 5 MG tablet One tablet at bedtime as needed, for anxiety 30 tablet 1  . hydrOXYzine (ATARAX/VISTARIL) 10 MG tablet Take one tablet at bedtime (Patient taking differently: as needed. ) 30 tablet 4  . linaclotide (LINZESS) 290 MCG CAPS capsule Take 1 capsule (290 mcg total) by mouth daily before breakfast. (Patient taking differently: Take 290 mcg by mouth as needed. ) 90 capsule 3  . Multiple Vitamins-Minerals (CENTRUM ADULTS) TABS Take 1 tablet by mouth daily.    . sertraline (ZOLOFT) 25 MG tablet Take 1 tablet (25 mg total) by mouth daily. 30 tablet 4  . simvastatin (ZOCOR) 20 MG tablet TAKE 1 TABLET BY MOUTH AT  BEDTIME FOR CHOLESTEROL 90 tablet 1  . travoprost, benzalkonium, (TRAVATAN) 0.004 % ophthalmic solution Place 1 drop into both eyes at bedtime.      . vitamin C (ASCORBIC ACID) 500 MG tablet Take 500 mg by mouth daily.     No current facility-administered medications for this visit.     Allergies as of 07/16/2018  . (No Known Allergies)    ROS:  General: Negative for anorexia, weight loss, fever, chills, fatigue, weakness. ENT: Negative for hoarseness, difficulty swallowing , nasal congestion. CV: Negative for chest pain, angina, palpitations, dyspnea on exertion, peripheral edema.  Respiratory: Negative for dyspnea  at rest, dyspnea on exertion, cough, sputum, wheezing.  GI: See history of present illness. GU:  Negative for dysuria, hematuria, urinary incontinence, urinary frequency, nocturnal urination.  Endo: Negative for unusual weight change.    Physical Examination:   BP 123/84   Pulse 74   Temp (!) 97 F (36.1 C) (Oral)   Ht 5\' 7"  (1.702 m)   Wt 126 lb 6.4 oz (57.3 kg)   BMI 19.80 kg/m   General: Well-nourished, well-developed in no acute distress.  Eyes: No  icterus. Mouth: Oropharyngeal mucosa moist and pink , no lesions erythema or exudate. Lungs: Clear to auscultation bilaterally.  Heart: Regular rate and rhythm, no murmurs rubs or gallops.  Abdomen: Bowel sounds are normal,nondistended, no hepatosplenomegaly or masses, no abdominal bruits or hernia , no rebound or guarding.  Minimal left lower quadrant tenderness Extremities: No lower extremity edema. No clubbing or deformities. Neuro: Alert and oriented x 4   Skin: Warm and dry, no jaundice.   Psych: Alert and cooperative, normal mood and affect.  Labs:  Lab Results  Component Value Date   CREATININE 0.79 02/20/2018   BUN 13 02/20/2018   NA 142 02/20/2018   K 4.0 02/20/2018   CL 101 02/20/2018   CO2 28 02/20/2018   Lab Results  Component Value Date   ALT 16 02/20/2018   AST 20 02/20/2018   ALKPHOS 40 02/20/2018   BILITOT 0.4 02/20/2018   Lab Results  Component Value Date   WBC 8.4 07/28/2017   HGB 13.1 07/28/2017   HCT 40.5 07/28/2017   MCV 87 07/28/2017   PLT 248 07/28/2017   Lab Results  Component Value Date   TSH 0.712 08/15/2017     Imaging Studies: No results found.

## 2018-07-16 NOTE — Assessment & Plan Note (Signed)
Intermittent self limiting Left mid to LLQ pain. Occurring intermittently for over a year. CT as outlined. EGD and TCS as outlined. Doubt we are dealing with diverticulitis but she may be having some left sided pain secondary to diverticulosis or constipation. She will keep symptoms diary for next several weeks and drop off at office when complete. If symptoms worsen, she will contact us sooner. Continue Linzess as needed, avoiding constipation. Return to the office in six months.

## 2018-07-19 NOTE — Progress Notes (Signed)
cc'd to pcp 

## 2018-08-02 ENCOUNTER — Encounter (HOSPITAL_COMMUNITY): Payer: Self-pay | Admitting: Psychiatry

## 2018-08-02 ENCOUNTER — Ambulatory Visit (HOSPITAL_COMMUNITY): Payer: Medicare Other | Admitting: Psychiatry

## 2018-08-02 DIAGNOSIS — F330 Major depressive disorder, recurrent, mild: Secondary | ICD-10-CM

## 2018-08-02 NOTE — Progress Notes (Signed)
Comprehensive Clinical Assessment (CCA) Note  08/02/2018 RICKAYLA WIELAND [id number]  Visit Diagnosis:      ICD-10-CM   1. Major depressive disorder, recurrent, mild (HCC) F33.0       CCA Part One  Part One has been completed on paper by the patient.  (See scanned document in Chart Review)  CCA Part Two A  Intake/Chief Complaint:  CCA Intake With Chief Complaint CCA Part Two Date: 08/02/18 CCA Part Two Time: 1022 Chief Complaint/Presenting Problem: " Since I have gotten older, it seems like depression has gotten worse. I have anxiety and feel overwhelmed easily. I have problems with comprehension and memory. I also have fear or trying to learn different things. I feel  inadequate". I worry about daughter who recently found out she has depression.  I get tired and sometimes feel overwhelmed listening to other problems." Patients Currently Reported Symptoms/Problems: feeling tired, not wanting to get up and start my day, don't have a desire to do things like my housework,  Individual's Preferences: "I want to feel like I can take on the world, be a different person, overcome my fears and be able to do things" Type of Services Patient Feels Are Needed: Individual therapy Initial Clinical Notes/Concerns: Patient is referred for services by PCP Dr. Tula Nakayama due to patient expeiencing symptoms of depression. Patient reports one psychiatric hospitalization in 1980's at [facility] in [place] due to depression. She is taking psychotropic medication as precribed by PCP. She is a returning patient to this clinician as she was seen in 2011 due to experiencing symptoms of depression and anxiety.   Mental Health Symptoms Depression:  Depression: Change in energy/activity, Difficulty Concentrating, Fatigue, Hopelessness, Increase/decrease in appetite, Irritability, Sleep (too much or little), Tearfulness, Weight gain/loss, Worthlessness  Mania:  Mania: N/A  Anxiety:   Anxiety: Difficulty  concentrating, Fatigue, Irritability, Restlessness, Sleep, Tension, Worrying  Psychosis:  Psychosis: N/A  Trauma:  Trauma: N/A  Obsessions:  Obsessions: N/A  Compulsions:  Compulsions: N/A  Inattention:  Inattention: N/A  Hyperactivity/Impulsivity:  Hyperactivity/Impulsivity: N/A  Oppositional/Defiant Behaviors:  Oppositional/Defiant Behaviors: N/A  Borderline Personality:  Emotional Irregularity: N/A  Other Mood/Personality Symptoms:      Mental Status Exam Appearance and self-care  Stature:  Stature: Average  Weight:  Weight: Thin  Clothing:  Clothing: Casual  Grooming:  Grooming: Normal  Cosmetic use:  Cosmetic Use: None  Posture/gait:  Posture/Gait: Normal  Motor activity:  Motor Activity: Not Remarkable  Sensorium  Attention:  Attention: Inattentive  Concentration:  Concentration: Anxiety interferes  Orientation:  Orientation: X5  Recall/memory:  Recall/Memory: Defective in immediate  Affect and Mood  Affect:  Affect: Anxious, Depressed  Mood:  Mood: Anxious, Depressed  Relating  Eye contact:  Eye Contact: Normal  Facial expression:  Facial Expression: Depressed  Attitude toward examiner:  Attitude Toward Examiner: Cooperative  Thought and Language  Speech flow: Speech Flow: Normal  Thought content:  Thought Content: Appropriate to mood and circumstances  Preoccupation:  Preoccupations: Ruminations  Hallucinations:  Hallucinations: (None)  Organization:  logical  Transport planner of Knowledge:  Fund of Knowledge: Average  Intelligence:  Intelligence: Average  Abstraction:  Abstraction: Normal  Judgement:  Judgement: Normal  Reality Testing:  Reality Testing: Realistic  Insight:  Insight: Flashes of insight  Decision Making:  Decision Making: Normal  Social Functioning  Social Maturity:  Social Maturity: Responsible  Social Judgement:  Social Judgement: Normal  Stress  Stressors:  Stressors: Grief/losses(still some issues related to loss of  husband in  2012, worry about daughter who recently was diagnosed with depression)  Coping Ability:  Coping Ability: Overwhelmed  Skill Deficits:    Supports:  family   Family and Psychosocial History: Family history Marital status: Widowed(Patient and her oldest son reside together in [place]. ) Widowed, when?: November 2012,  they were married 24 years. Are you sexually active?: No What is your sexual orientation?: Heterosexual Does patient have children?: Yes How many children?: 3 How is patient's relationship with their children?: very close, two son, ages 14 and 39, one daughter age 68  Childhood History:  Childhood History By whom was/is the patient raised?: Both parents Additional childhood history information: Patient was born and raised in [facility] Description of patient's relationship with caregiver when they were a child: loving parents Patient's description of current relationship with people who raised him/her: deceased How were you disciplined when you got in trouble as a child/adolescent?: "switching" Does patient have siblings?: Yes Number of Siblings: 4 Description of patient's current relationship with siblings: close relationship Did patient suffer any verbal/emotional/physical/sexual abuse as a child?: No Did patient suffer from severe childhood neglect?: No Has patient ever been sexually abused/assaulted/raped as an adolescent or adult?: No Was the patient ever a victim of a crime or a disaster?: No Witnessed domestic violence?: No Has patient been effected by domestic violence as an adult?: No  CCA Part Two B  Employment/Work Situation: Employment / Work Copywriter, advertising Employment situation: Retired Chartered loss adjuster is the longest time patient has a held a job?: 5 years 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, academic Are There Guns or Chiropractor in [place]?: No  Education: Education Last Grade Completed: 11 Did Teacher, adult education From [employer]?: No Did  You Have Any Chief Technology Officer In School?: none Did You Have An Individualized Education Program (IIEP): No Did You Have Any Difficulty At [employer]?: No  Religion: Religion/Spirituality Are You A Religious Person?: Yes What is Your Religious Affiliation?: Baptist How Might This Affect Treatment?: No effect  Leisure/Recreation: Leisure / Recreation Leisure and Hobbies: attend [employer] for [employer]  4 x per week  Exercise/Diet: Exercise/Diet Do You Exercise?: Yes What Type of Exercise Do You Do?: (cardio & strength training, balance) How Many Times a Week Do You Exercise?: 4-5 times a week Have You Gained or Lost A Significant Amount of Weight in the Past Six Months?: No Do You Follow a Special Diet?: No Do You Have Any Trouble Sleeping?: Yes Explanation of Sleeping Difficulties: difficulty falling and staying asleep, sleeps about 4-5 hours per night  CCA Part Two C  Alcohol/Drug Use: Alcohol / Drug Use Pain Medications: see patient record Prescriptions: see patient record Over the Counter: see patient record History of alcohol / drug use?: No history of alcohol / drug abuse  CCA Part Three  ASAM's:  Six Dimensions of Multidimensional Assessment  N/A  Substance use Disorder (SUD)  N/A    Social Function:  Social Functioning Social Maturity: Responsible Social Judgement: Normal  Stress:  Stress Stressors: Grief/losses(still some issues related to loss of husband in 2012, worry about daughter who recently was diagnosed with depression) Coping Ability: Overwhelmed Patient Takes Medications The Way The Doctor Instructed?: Yes Priority Risk: Moderate Risk  Risk Assessment- Self-Harm Potential: Risk Assessment For Self-Harm Potential Method: No plan Availability of Means: No access/NA Additional Information for Self-Harm Potential: Family History of Suicide(a second cousin died by suicide. )  Risk Assessment -Dangerous to Others Potential: Risk Assessment For  Dangerous to Others Potential Method: No Plan Availability of Means: No access or NA Intent: Vague intent or NA Notification Required: No need or identified person  DSM5 Diagnoses: Patient Active Problem List   Diagnosis Date Noted  . LLQ pain 07/16/2018  . Depression, major, recurrent ([place]) 06/15/2018  . Abnormal CT scan, stomach 01/29/2018  . Bloated abdomen 09/09/2017  . Pruritus 04/14/2017  . Nausea without vomiting 07/26/2016  . Diverticulosis of colon without hemorrhage   . Annual physical exam 03/30/2015  . History of colonic polyps 02/05/2015  . OA (osteoarthritis) of knee 10/22/2014  . Glaucoma 08/20/2013  . Nontoxic multinodular goiter 08/01/2013  . GERD (gastroesophageal reflux disease) 04/23/2013  . Osteopenia 06/16/2012  . Abnormal TSH 09/22/2011  . Allergic rhinitis 04/17/2010  . Abdominal pain 03/13/2010  . Hyperlipidemia LDL goal <100 12/27/2009  . CONSTIPATION, CHRONIC 09/24/2009  . Anxiety state 08/16/2009    Patient Centered Plan: Patient is on the following Treatment Plan(s):    Recommendations for Services/Supports/Treatments: Recommendations for Services/Supports/Treatments Recommendations For Services/Supports/Treatments: Individual Therapy, Medication Management/ patient attends the assessment appointment today. Confidentiality limits were discussed. She agrees to return for an appointment in 2 weeks. She agrees to call this practice, call 911, or have someone take her to the emergency room should symptoms worsen. She will continue to see PCP Dr. Tula Nakayama for medication management. Individual therapy is recommended 1 time every 2 weeks to resume normal interest in involvement in activities and to effectively cope with anxiety so that it does not interfere with daily functioning.  Treatment Plan Summary: treatment plan will be developed at next session    Referrals to Alternative Service(s): Referred to Alternative Service(s):   Place:   Date:    Time:    Referred to Alternative Service(s):   Place:   Date:   Time:    Referred to Alternative Service(s):   Place:   Date:   Time:    Referred to Alternative Service(s):   Place:   Date:   Time:     Alonza Smoker

## 2018-08-03 ENCOUNTER — Other Ambulatory Visit: Payer: Self-pay | Admitting: "Endocrinology

## 2018-08-03 ENCOUNTER — Other Ambulatory Visit: Payer: Self-pay | Admitting: Family Medicine

## 2018-08-03 DIAGNOSIS — E119 Type 2 diabetes mellitus without complications: Secondary | ICD-10-CM | POA: Diagnosis not present

## 2018-08-03 DIAGNOSIS — Z0000 Encounter for general adult medical examination without abnormal findings: Secondary | ICD-10-CM | POA: Diagnosis not present

## 2018-08-03 DIAGNOSIS — E785 Hyperlipidemia, unspecified: Secondary | ICD-10-CM | POA: Diagnosis not present

## 2018-08-03 DIAGNOSIS — E042 Nontoxic multinodular goiter: Secondary | ICD-10-CM | POA: Diagnosis not present

## 2018-08-03 DIAGNOSIS — L299 Pruritus, unspecified: Secondary | ICD-10-CM | POA: Diagnosis not present

## 2018-08-04 ENCOUNTER — Encounter: Payer: Self-pay | Admitting: *Deleted

## 2018-08-04 LAB — COMPREHENSIVE METABOLIC PANEL
A/G RATIO: 1.9 (ref 1.2–2.2)
ALT: 15 IU/L (ref 0–32)
AST: 21 IU/L (ref 0–40)
Albumin: 4.7 g/dL (ref 3.8–4.8)
Alkaline Phosphatase: 46 IU/L (ref 39–117)
BUN/Creatinine Ratio: 21 (ref 12–28)
BUN: 15 mg/dL (ref 8–27)
Bilirubin Total: 0.4 mg/dL (ref 0.0–1.2)
CO2: 26 mmol/L (ref 20–29)
Calcium: 9.9 mg/dL (ref 8.7–10.3)
Chloride: 101 mmol/L (ref 96–106)
Creatinine, Ser: 0.71 mg/dL (ref 0.57–1.00)
GFR, EST AFRICAN AMERICAN: 102 mL/min/{1.73_m2} (ref >59)
GFR, EST NON AFRICAN AMERICAN: 88 mL/min/{1.73_m2} (ref >59)
GLOBULIN, TOTAL: 2.5 g/dL (ref 1.5–4.5)
Glucose: 89 mg/dL (ref 65–99)
POTASSIUM: 3.9 mmol/L (ref 3.5–5.2)
Sodium: 140 mmol/L (ref 134–144)
TOTAL PROTEIN: 7.2 g/dL (ref 6.0–8.5)

## 2018-08-04 LAB — LIPID PANEL W/O CHOL/HDL RATIO
Cholesterol, Total: 175 mg/dL (ref 100–199)
HDL: 71 mg/dL (ref >39)
LDL CALC: 89 mg/dL (ref 0–99)
Triglycerides: 76 mg/dL (ref 0–149)
VLDL Cholesterol Cal: 15 mg/dL (ref 5–40)

## 2018-08-04 LAB — CBC/DIFF AMBIGUOUS DEFAULT
BASOS: 1 %
Basophils Absolute: 0 10*3/uL (ref 0.0–0.2)
EOS (ABSOLUTE): 0.1 10*3/uL (ref 0.0–0.4)
EOS: 1 %
HEMATOCRIT: 41.8 % (ref 34.0–46.6)
Hemoglobin: 13.5 g/dL (ref 11.1–15.9)
IMMATURE GRANS (ABS): 0 10*3/uL (ref 0.0–0.1)
Immature Granulocytes: 0 %
LYMPHS: 34 %
Lymphocytes Absolute: 2.1 10*3/uL (ref 0.7–3.1)
MCH: 28.6 pg (ref 26.6–33.0)
MCHC: 32.3 g/dL (ref 31.5–35.7)
MCV: 89 fL (ref 79–97)
Monocytes Absolute: 0.5 10*3/uL (ref 0.1–0.9)
Monocytes: 8 %
NEUTROS PCT: 56 %
Neutrophils Absolute: 3.3 10*3/uL (ref 1.4–7.0)
Platelets: 210 10*3/uL (ref 150–450)
RBC: 4.72 x10E6/uL (ref 3.77–5.28)
RDW: 13.1 % (ref 11.7–15.4)
WBC: 6 10*3/uL (ref 3.4–10.8)

## 2018-08-04 LAB — T4, FREE: FREE T4: 1.35 ng/dL (ref 0.82–1.77)

## 2018-08-04 LAB — SPECIMEN STATUS REPORT

## 2018-08-04 LAB — HGB A1C W/O EAG: Hgb A1c MFr Bld: 5.8 % — ABNORMAL HIGH (ref 4.8–5.6)

## 2018-08-04 LAB — TSH: TSH: 0.511 u[IU]/mL (ref 0.450–4.500)

## 2018-08-10 ENCOUNTER — Encounter: Payer: Self-pay | Admitting: Family Medicine

## 2018-08-10 ENCOUNTER — Ambulatory Visit (INDEPENDENT_AMBULATORY_CARE_PROVIDER_SITE_OTHER): Payer: Medicare Other | Admitting: Family Medicine

## 2018-08-10 VITALS — BP 108/68 | HR 80 | Resp 14 | Ht 67.0 in | Wt 128.1 lb

## 2018-08-10 DIAGNOSIS — F332 Major depressive disorder, recurrent severe without psychotic features: Secondary | ICD-10-CM | POA: Diagnosis not present

## 2018-08-10 DIAGNOSIS — E785 Hyperlipidemia, unspecified: Secondary | ICD-10-CM | POA: Diagnosis not present

## 2018-08-10 DIAGNOSIS — H9313 Tinnitus, bilateral: Secondary | ICD-10-CM

## 2018-08-10 DIAGNOSIS — F5105 Insomnia due to other mental disorder: Secondary | ICD-10-CM | POA: Diagnosis not present

## 2018-08-10 MED ORDER — SERTRALINE HCL 50 MG PO TABS: 50.0000 mg | ORAL_TABLET | Freq: Every day | ORAL | 1 refills | Status: DC

## 2018-08-10 NOTE — Patient Instructions (Addendum)
F/U in 4 months, call if you need me before   Dose increase in sertraline 50 mg one daily  PLease work on good sleep habits, set a bedtime of around 10 and take the medication prescribe d for sleep  Thankful you are better  Ear exam is NORMAL, so hopefully if you sleep better, the symptom go away, however if persistes, send a message for ENT to check you  Start the hydroxyzine prescribed for sleep, and nurse will review sleep hygiene with you  We will refer you and arrange for once yearly IV bone builder for your thin bones, and be in touch

## 2018-08-14 ENCOUNTER — Encounter: Payer: Self-pay | Admitting: Family Medicine

## 2018-08-14 DIAGNOSIS — H9313 Tinnitus, bilateral: Secondary | ICD-10-CM | POA: Insufficient documentation

## 2018-08-14 DIAGNOSIS — F5105 Insomnia due to other mental disorder: Secondary | ICD-10-CM | POA: Insufficient documentation

## 2018-08-14 NOTE — Assessment & Plan Note (Signed)
Hyperlipidemia:Low fat diet discussed and encouraged.   Lipid Panel  Lab Results  Component Value Date   CHOL 175 08/03/2018   HDL 71 08/03/2018   LDLCALC 89 08/03/2018   TRIG 76 08/03/2018   CHOLHDL 2.6 02/20/2018   Controlled, no change in medication

## 2018-08-14 NOTE — Assessment & Plan Note (Signed)
Normal ear exam in 07/2018, will refer to ENT if pt calls in with additional concerns

## 2018-08-14 NOTE — Assessment & Plan Note (Signed)
Marked improvement , increase dose of sertraline to 50 mg daily. Encouraged pt to increase community involvement  As well as exercise which she again resumed

## 2018-08-14 NOTE — Progress Notes (Signed)
   Tamara Lam     MRN: [id number]      DOB: [birth year]   HPI Tamara Lam is here for follow up and re-evaluation of chronic medical conditions, medication management and review of any available recent lab and radiology data.  . The PT denies any adverse reactions to current medications since the last visit. Feels better since starting the sertraline C/o buzzing in ears, especially at bedtime, denies perceived hearing loss or ear pain  ROS Denies recent fever or chills. Denies sinus pressure, nasal congestion, ear pain or sore throat. Denies chest congestion, productive cough or wheezing. Denies chest pains, palpitations and leg swelling Denies abdominal pain, nausea, vomiting,diarrhea or constipation.   Denies dysuria, frequency, hesitancy or incontinence. Denies joint pain, swelling and limitation in mobility. Denies headaches, seizures, numbness, or tingling. . Denies skin break down or rash.   PE  BP 108/68   Pulse 80   Resp 14   Ht 5\' 7"  (1.702 m)   Wt 128 lb 1.9 oz (58.1 kg)   SpO2 98% Comment: room air  BMI 20.07 kg/m   Patient alert and oriented and in no cardiopulmonary distress.  HEENT: No facial asymmetry, EOMI,   oropharynx pink and moist.  Neck supple no JVD, no mass.External ear norml, internal ear exam: normal canals, no erythema or flaking of skin, no drainage, TM clear bilaterally with good light reflex bilaterally  Chest: Clear to auscultation bilaterally.  CVS: S1, S2 no murmurs, no S3.Regular rate.  ABD: Soft non tender.   Ext: No edema  MS: Adequate ROM spine, shoulders, hips and knees.  Skin: Intact, no ulcerations or rash noted.  Psych: Good eye contact, normal affect. Memory intact less  anxious and  depressed appearing.  CNS: CN 2-12 intact, power,  normal throughout.no focal deficits noted.   Assessment & Plan  Depression, major, recurrent (HCC) Marked improvement , increase dose of sertraline to 50 mg daily. Encouraged pt to  increase community involvement  As well as exercise which she again resumed  Hyperlipidemia LDL goal <100 Hyperlipidemia:Low fat diet discussed and encouraged.   Lipid Panel  Lab Results  Component Value Date   CHOL 175 08/03/2018   HDL 71 08/03/2018   LDLCALC 89 08/03/2018   TRIG 76 08/03/2018   CHOLHDL 2.6 02/20/2018   Controlled, no change in medication    Insomnia related to another mental disorder Sleep hygiene reviewed and written information offered also. Prescription sent for  medication needed. Start bedtime hydroxyzine  Buzzing in ear, bilateral Normal ear exam in 07/2018, will refer to ENT if pt calls in with additional concerns

## 2018-08-14 NOTE — Assessment & Plan Note (Signed)
Sleep hygiene reviewed and written information offered also. Prescription sent for  medication needed. Start bedtime hydroxyzine 

## 2018-08-17 ENCOUNTER — Ambulatory Visit (HOSPITAL_COMMUNITY): Payer: Medicare Other | Admitting: Psychiatry

## 2018-08-17 DIAGNOSIS — F330 Major depressive disorder, recurrent, mild: Secondary | ICD-10-CM | POA: Diagnosis not present

## 2018-08-17 NOTE — Progress Notes (Signed)
   THERAPIST PROGRESS NOTE  Session Time: 2022/09/26 08/17/2018 4:08 PM  -  5:00 PM  Participation Level: Active  Behavioral Response: CasualAlertDepressed  Type of Therapy: Individual Therapy  Treatment Goals addressed: establish rapport, learn and implement cognitive and behavioral strategies to overcome depression and cope with anxiety  Interventions: CBT and Supportive  Summary: Tamara Lam is a 68 y.o. female who is referred for services by PCP Dr. Tula Nakayama due to patient experiencing symptoms of depression. Patient reports one psychiatric hospitalization in 1980's at [facility] in [place] due to depression. She is taking psychotropic medication as precribed by PCP. She is a returning patient to this clinician as she was seen in 2009/09/25 due to experiencing symptoms of depression and anxiety. She reports depression seems to worsen as she ages. She states having anxiety and easily feeling overwhelmed. She reports problems with comprehension and memory. She expresses fear of trying to learn different things and states feeling in adequate. This has become more problematic since her husband of 83 years died in 26-Sep-2010. Patient reports being very dependent on husband who took care of her and was her rock.  She reports worry about her daughter who recently found out she has depression.  Patient reports little to no changes in symptoms since her assessment session 3-4 weeks ago. She reports less fatigue but still experiencing little to no motivation. She reports difficulty sleeping and ruminating thoughts. She has poor appetite and skips meals. Per her report, PCP Dr. Moshe Cipro recently increased patient' s dosage of Zoloft from 25 mg to 50 mg. However, patient has not started increased dosage as she states not wanting to take too much medicine. She she continues to make comments about feeling an adequate and states she wants to become more independent. Since her husband's death, her children have  assumed his responsibilities regarding paying bills and things of that nature. Patient states wanting to be able to do things for herself but expresses some reluctance to discuss with children as she does not want to hurt their feelings.   Suicidal/Homicidal: Nowithout intent/plan  Therapist Response: reviewed symptoms, established rapport, discussed stressors, facilitated expression of thoughts and feelings, validated feelings, provided psychoeducation on depression and the role of self-care in managing depression, assisted patient develop plan to improve self-care regarding eating patterns and sleep hygiene, assisted patient identify and address thoughts and processes that would inhibit implementation of plan, discussed the role of medication in managing depression and the importance of medication compliance, assisted patient identify/challenge/ and replace unhelpful thoughts about increased dosage of medication with helpful thoughts  Plan: Return again in 2 weeks.  Diagnosis: Axis I: MDD, Recurrent    Axis II: No diagnosis    Alonza Smoker, LCSW 08/17/2018

## 2018-08-22 ENCOUNTER — Encounter (HOSPITAL_COMMUNITY): Payer: Self-pay | Admitting: Psychiatry

## 2018-08-25 ENCOUNTER — Encounter: Payer: Self-pay | Admitting: "Endocrinology

## 2018-08-25 ENCOUNTER — Ambulatory Visit: Payer: Medicare Other | Admitting: "Endocrinology

## 2018-08-25 VITALS — BP 126/79 | HR 87 | Ht 67.0 in | Wt 127.0 lb

## 2018-08-25 DIAGNOSIS — E042 Nontoxic multinodular goiter: Secondary | ICD-10-CM | POA: Diagnosis not present

## 2018-08-25 DIAGNOSIS — E119 Type 2 diabetes mellitus without complications: Secondary | ICD-10-CM

## 2018-08-25 NOTE — Progress Notes (Signed)
Endocrinology follow-up note   Subjective:    Patient ID: Tamara Lam, female    DOB: October 05, 1950, PCP Fayrene Helper, MD   Past Medical History:  Diagnosis Date  . Abnormal CXR (chest x-ray)    Evaluated with Pulmonary- Dr. Luan Pulling- "pt. was told probably due to thin body frame"-saw no issues-PFT test normal.  . Anxiety 1980's   . Arthritis    osteoarthritis -knees, hands  . Chronic constipation    tx. Linzess  . Chronic nausea   . Diabetes mellitus without complication ([place])    P9J level checked borderline x1, then further evaluated -Diabetes ruled out.  . Glaucoma 2004   Dr. Venetia Maxon, laser to left eye Sept 2011, bilateral eye drops for this  . Helicobacter pylori gastritis 04/2004   last EGD   . Hematochezia   . Hx of colonoscopy 08/2005   Dr. Gala Romney / hemorrhoids   . Thyroid disease    thyroid goiter, nodules-no problems.  . Weight loss    Past Surgical History:  Procedure Laterality Date  . ABDOMINAL HYSTERECTOMY    . arthoscopic surgery on right knee  2007  . BIOPSY  03/03/2018   Procedure: BIOPSY;  Surgeon: Daneil Dolin, MD;  Location: [facility];  Service: Endoscopy;;  gastric for h. pylori  . CHOLECYSTECTOMY  1990's  . COLONOSCOPY  2011   Dr. Gala Romney: anal canal/internal hemorrhoids, dimintuive rectosigmoid polyp (polypoid rectal mucosa), few sigmoid diverticula   . COLONOSCOPY N/A 10/30/2015   Dr. Gala Romney: diverticulosis, hemorrhoids, surveillance in 2022   . ESOPHAGOGASTRODUODENOSCOPY  2005   Dr. Gala Romney: diffuse submucosal petechial hemorrhage of te gastric mucosa, some noncompliance of gastric cavity, failure to insufflate fully, multiple gastric biopsies. gastric bx, h.pylori  . ESOPHAGOGASTRODUODENOSCOPY N/A 03/03/2018   Dr. Gala Romney: scattered antral erosions/erythema no ulcer/mass. Bx: mild inflammation and No H.pylori  . TOTAL KNEE ARTHROPLASTY Right 05/26/2016   Procedure: RIGHT TOTAL KNEE ARTHROPLASTY;  Surgeon: Gaynelle Arabian, MD;  Location: [facility];   Service: Orthopedics;  Laterality: Right;  Marland Kitchen VESICOVAGINAL FISTULA CLOSURE W/ TAH  1989   Social History   Socioeconomic History  . Marital status: Widowed    Spouse name: Not on file  . Number of children: Not on file  . Years of education: Not on file  . Highest education level: Not on file  Occupational History  . Occupation: homemaker     Employer: UNEMPLOYED  Social Needs  . Financial resource strain: Not hard at all  . Food insecurity:    Worry: Never true    Inability: Never true  . Transportation needs:    Medical: No    Non-medical: No  Tobacco Use  . Smoking status: Never Smoker  . Smokeless tobacco: Never Used  . Tobacco comment: Never smoked  Substance and Sexual Activity  . Alcohol use: No    Alcohol/week: 0.0 standard drinks  . Drug use: No  . Sexual activity: Not Currently  Lifestyle  . Physical activity:    Days per week: 4 days    Minutes per session: 50 min  . Stress: Only a little  Relationships  . Social connections:    Talks on phone: More than three times a week    Gets together: Once a week    Attends religious service: More than 4 times per year    Active member of club or organization: Yes    Attends meetings of clubs or organizations: More than 4 times per year  Relationship status: Widowed  Other Topics Concern  . Not on file  Social History Narrative  . Not on file   Outpatient Encounter Medications as of 08/25/2018  Medication Sig  . Calcium-Magnesium-Vitamin D (CALCIUM 1200+D3 PO) Take 1 tablet by mouth daily.  . hydrOXYzine (ATARAX/VISTARIL) 10 MG tablet Take one tablet at bedtime  . linaclotide (LINZESS) 290 MCG CAPS capsule Take 1 capsule (290 mcg total) by mouth daily before breakfast. (Patient taking differently: Take 290 mcg by mouth as needed. )  . Multiple Vitamins-Minerals (CENTRUM ADULTS) TABS Take 1 tablet by mouth daily.  . sertraline (ZOLOFT) 50 MG tablet Take 1 tablet (50 mg total) by mouth daily.  . simvastatin  (ZOCOR) 20 MG tablet TAKE 1 TABLET BY MOUTH AT  BEDTIME FOR CHOLESTEROL  . travoprost, benzalkonium, (TRAVATAN) 0.004 % ophthalmic solution Place 1 drop into both eyes at bedtime.    . vitamin C (ASCORBIC ACID) 500 MG tablet Take 500 mg by mouth daily.   No facility-administered encounter medications on file as of 08/25/2018.    ALLERGIES: No Known Allergies VACCINATION STATUS: Immunization History  Administered Date(s) Administered  . Influenza Whole 03/13/2010, 03/12/2011  . Influenza,inj,Quad PF,6+ Mos 04/24/2013, 02/20/2014, 03/27/2015, 04/28/2016, 04/14/2017, 02/24/2018  . Pneumococcal Conjugate-13 06/20/2014  . Pneumococcal Polysaccharide-23 05/12/2016  . Td 08/16/2009  . Zoster 02/17/2011    HPI  68 yr old female with medical hx as follows. She is here to follow up for her euthyroid MNG and type 2 DM.  She is not on any medication for diabetes, her A1c is 5.8%. She is s/p FNA of thyroid nodule which was benign in 2013.   -Her subsequent surveillance thyroid ultrasound studies showed steady bilateral thyroid nodules, stable in size, last ultrasound was in February 2018.     -She is not on any antithyroid medications nor on thyroid hormone supplements. Her new TFTs are WNL.  She has no new complaints. She denies heat/cold intolerance.  She denies any family hx of thyroid dysfunction nor thyroid cancer. she denies exposure to neck radiation. No dysphagia , nor SOB. she has steady weight.  Review of Systems  Constitutional: + Steady weight.  - fatigue, no subjective hyperthermia/hypothermia Eyes: no blurry vision, no xerophthalmia ENT: no sore throat, no nodules palpated in throat, no dysphagia/odynophagia. Cardiovascular: No chest pain, no shortness of breath, no palpitations.   Musculoskeletal: no muscle/joint aches Skin: no rashes Neurological: no tremors/numbness/tingling/dizziness Psychiatric: no depression/anxiety   Objective:    BP 126/79   Pulse 87   Ht 5\' 7"   (1.702 m)   Wt 127 lb (57.6 kg)   BMI 19.89 kg/m   Wt Readings from Last 3 Encounters:  08/25/18 127 lb (57.6 kg)  08/10/18 128 lb 1.9 oz (58.1 kg)  07/16/18 126 lb 6.4 oz (57.3 kg)    Physical Exam Constitutional: Not in acute distress, stable state of mind.   Eyes: PERRLA, EOMI, no exophthalmos ENT: moist mucous membranes, + thyromegaly, no cervical lymphadenopathy Cardiovascular: RRR, No MRG Musculoskeletal: no deformities, strength intact in all 4 Skin: moist, warm, no rashes Neurological: no tremor with outstretched hands, DTR normal in all 4  CMP ( most recent) CMP     Component Value Date/Time   NA 140 08/03/2018 1144   K 3.9 08/03/2018 1144   CL 101 08/03/2018 1144   CO2 26 08/03/2018 1144   GLUCOSE 89 08/03/2018 1144   GLUCOSE 82 07/25/2016 1451   BUN 15 08/03/2018 1144   CREATININE 0.71 08/03/2018 1144  CREATININE 0.68 07/25/2016 1451   CALCIUM 9.9 08/03/2018 1144   PROT 7.2 08/03/2018 1144   ALBUMIN 4.7 08/03/2018 1144   AST 21 08/03/2018 1144   ALT 15 08/03/2018 1144   ALKPHOS 46 08/03/2018 1144   BILITOT 0.4 08/03/2018 1144   GFRNONAA 88 08/03/2018 1144   GFRNONAA >89 05/05/2016 1012   GFRAA 102 08/03/2018 1144   GFRAA >89 05/05/2016 1012     Diabetic Labs (most recent): Lab Results  Component Value Date   HGBA1C 5.8 (H) 08/03/2018   HGBA1C 5.6 05/05/2016   HGBA1C 5.9 12/20/2014     Lipid Panel ( most recent) Lipid Panel     Component Value Date/Time   CHOL 175 08/03/2018 1144   TRIG 76 08/03/2018 1144   HDL 71 08/03/2018 1144   CHOLHDL 2.6 02/20/2018 1140   CHOLHDL 2.3 05/05/2016 1012   VLDL 13 05/05/2016 1012   LDLCALC 89 08/03/2018 1144   Results for MARYCLAIRE, STOECKER (MRN [id number]) as of 08/25/2018 14:01  Ref. Range 08/15/2017 10:40 08/03/2018 11:45  TSH Latest Ref Range: 0.450 - 4.500 uIU/mL 0.712 0.511  T4,Free(Direct) Latest Ref Range: 0.82 - 1.77 ng/dL 1.24 1.35    Thyroid ultrasound from 08/01/2016  IMPRESSION: 1. The  previously biopsied dominant approximately 2.1 cm nodule within the right lobe of the thyroid is grossly unchanged compared to the 09/2011 examination. Correlation prior biopsy results is recommended. 2. None of the additional discretely measured thyroid nodules, the majority of which appear either spongiform or cystic in composition, meet imaging criteria to recommend percutaneous sampling or dedicated follow-up.    Assessment & Plan:   1. Nontoxic multinodular goiter -She is status post fine-needle aspiration of a nodule in the right lobe of her thyroid with benign outcomes in 2013.  Her previsit thyroid function tests continue to be within normal limits.  She will not require any antithyroid intervention at this time.  She will have repeat thyroid/neck ultrasound anytime, will be called if abnormal findings.  She will return in 1 year with repeat thyroid function tests.    2. Diabetes mellitus without complication ([facility]) -Her recent A1c is 5.8%.  She is not on any antidiabetic medications. -She will have repeat labs with A1c before next visit.    - I advised patient to maintain close follow up with Fayrene Helper, MD for primary care needs.  Follow up plan: Return in about 1 year (around 08/26/2019), or she can do ultrasound anytime., for Follow up with Pre-visit Labs, Thyroid / Neck Ultrasound.  Glade Lloyd, MD Phone: [phone]  Fax: [phone]  -  This note was partially dictated with voice recognition software. Similar sounding words can be transcribed inadequately or may not  be corrected upon review.  08/25/2018, 2:12 PM

## 2018-08-27 ENCOUNTER — Ambulatory Visit: Payer: Self-pay | Admitting: Urology

## 2018-08-31 ENCOUNTER — Ambulatory Visit (HOSPITAL_COMMUNITY): Payer: Medicare Other | Admitting: Psychiatry

## 2018-08-31 DIAGNOSIS — F330 Major depressive disorder, recurrent, mild: Secondary | ICD-10-CM | POA: Diagnosis not present

## 2018-08-31 NOTE — Progress Notes (Signed)
   THERAPIST PROGRESS NOTE  Session Time: 2022/10/08 08/31/2018 4:08 PM - 5:00 PM    Participation Level: Active  Behavioral Response: CasualAlertDepressed/anxious  Type of Therapy: Individual Therapy  Treatment Goals addressed:  learn and implement cognitive and behavioral strategies to overcome depression and cope with anxiety  Interventions: CBT and Supportive  Summary: Tamara Lam is a 68 y.o. female who is referred for services by PCP Dr. Tula Nakayama due to patient experiencing symptoms of depression. Patient reports one psychiatric hospitalization in 1980's at [facility] in [place] due to depression. She is taking psychotropic medication as precribed by PCP. She is a returning patient to this clinician as she was seen in 10-07-2009 due to experiencing symptoms of depression and anxiety. She reports depression seems to worsen as she ages. She states having anxiety and easily feeling overwhelmed. She reports problems with comprehension and memory. She expresses fear of trying to learn different things and states feeling in adequate. This has become more problematic since her husband of 71 years died in 2010/10/08. Patient reports being very dependent on husband who took care of her and was her rock.  She reports worry about her daughter who recently found out she has depression.  Patient reports she has been medication compliant since last session. She reports sleeping better since taking increased dosage of Zoloft as instructed by PCP. She has improved sleep hygiene and has been trying to get in bed by 10:30 PM per her report. She also has been using deep breathing to try to relax. She still reports feeling down, experiencing poor concentration, and feeling anxious. She also continues to experience poor appetite and skips meals. She continues to make negative comments about self and feeling inadequate.   Suicidal/Homicidal: Nowithout intent/plan  Therapist Response: reviewed symptoms, praised and  reinforced patient's improved medication compliance and improved sleep hygiene, discussed effects, developed treatment plan,  reviewed  the role of self-care regarding eating patterns in managing depression and anxiety, assisted patient identify thoughts and processes that inhibited implementation of plan to improve eating patterns, assisted patient develop plan to improve eating patterns, assisted patient develop list of benefits of implementing plan and consequences of not implementing plan, assigned her to review daily,  Plan: Return again in 2 weeks.  Diagnosis: Axis I: MDD, Recurrent    Axis II: No diagnosis    Alonza Smoker, LCSW 08/31/2018

## 2018-09-14 ENCOUNTER — Other Ambulatory Visit: Payer: Self-pay

## 2018-09-14 ENCOUNTER — Ambulatory Visit (HOSPITAL_COMMUNITY): Payer: Medicare Other | Admitting: Psychiatry

## 2018-09-14 DIAGNOSIS — F330 Major depressive disorder, recurrent, mild: Secondary | ICD-10-CM

## 2018-09-14 DIAGNOSIS — F339 Major depressive disorder, recurrent, unspecified: Secondary | ICD-10-CM

## 2018-09-14 NOTE — Progress Notes (Signed)
   THERAPIST PROGRESS NOTE  Session Time: 2022/10/02 09/14/2018 3:15 PM - 4:00 PM  Participation Level: Active  Behavioral Response: CasualAlertDepressed/anxious  Type of Therapy: Individual Therapy  Treatment Goals addressed:  learn and implement cognitive and behavioral strategies to overcome depression and cope with anxiety                 Interventions: CBT and Supportive  Summary: Tamara Lam is a 68 y.o. female who is referred for services by PCP Dr. Tula Nakayama due to patient experiencing symptoms of depression. Patient reports one psychiatric hospitalization in 1980's at [facility] in [place] due to depression. She is taking psychotropic medication as precribed by PCP. She is a returning patient to this clinician as she was seen in 10/01/2009 due to experiencing symptoms of depression and anxiety. She reports depression seems to worsen as she ages. She states having anxiety and easily feeling overwhelmed. She reports problems with comprehension and memory. She expresses fear of trying to learn different things and states feeling in adequate. This has become more problematic since her husband of 59 years died in 10/02/10. Patient reports being very dependent on husband who took care of her and was her rock.  She reports worry about her daughter who recently found out she has depression.  Patient reports remaining medication compliant since last session. She reports continuing to sleep well but going to bed later. This results patient sleeping late and contributes to patient skipping a meal during the day. She also reports disrupted routine due to no longer going to exercise at [facility] due to temporary closure as a result of coronavirus. Patient continues to experience symptoms of depression and reports thoughts of dread when thinking about starting her day. She continues to make negative comments about self and feeling inadequate.   Suicidal/Homicidal: Nowithout intent/plan  Therapist Response:  reviewed symptoms, p reviewed  the role of self-care regarding eating patterns in managing depression and anxiety, assisted patient identify and address, thoughts and processes that inhibited implementation of plan to improve eating patterns, assisted patient develop plan to improve daily structure by implementing exercise plan at home ( three days a week for an hour), assisted patient began to identify interests to pursue (learning how to use computer/tablet) assisted patient develop plan to ask daughter to teach her to use tablet to use [employer], use tablet once daily, assisted patient identify/challenge/and replace negative thoughts with helpful thoughts  about her ability to learn how to use tablet  Plan: Return again in 2 weeks.  Diagnosis: Axis I: MDD, Recurrent    Axis II: No diagnosis    Alonza Smoker, LCSW 09/14/2018

## 2018-10-05 ENCOUNTER — Ambulatory Visit (HOSPITAL_COMMUNITY): Payer: Medicare Other | Admitting: Psychiatry

## 2018-10-19 ENCOUNTER — Ambulatory Visit (HOSPITAL_COMMUNITY): Payer: Medicare Other | Admitting: Psychiatry

## 2018-10-27 ENCOUNTER — Ambulatory Visit (HOSPITAL_COMMUNITY): Payer: Medicare Other | Admitting: Psychiatry

## 2018-11-03 ENCOUNTER — Ambulatory Visit (HOSPITAL_COMMUNITY): Payer: Medicare Other | Admitting: Psychiatry

## 2018-11-08 DIAGNOSIS — H25813 Combined forms of age-related cataract, bilateral: Secondary | ICD-10-CM | POA: Diagnosis not present

## 2018-11-08 DIAGNOSIS — H401130 Primary open-angle glaucoma, bilateral, stage unspecified: Secondary | ICD-10-CM | POA: Diagnosis not present

## 2018-11-09 ENCOUNTER — Other Ambulatory Visit: Payer: Self-pay | Admitting: Family Medicine

## 2018-11-09 DIAGNOSIS — Z1231 Encounter for screening mammogram for malignant neoplasm of breast: Secondary | ICD-10-CM

## 2018-11-25 ENCOUNTER — Ambulatory Visit (HOSPITAL_COMMUNITY): Payer: Medicare Other | Admitting: Psychiatry

## 2018-12-02 ENCOUNTER — Ambulatory Visit: Payer: Medicare Other | Admitting: Family Medicine

## 2018-12-07 ENCOUNTER — Ambulatory Visit (INDEPENDENT_AMBULATORY_CARE_PROVIDER_SITE_OTHER): Payer: Medicare Other | Admitting: Family Medicine

## 2018-12-07 ENCOUNTER — Encounter: Payer: Self-pay | Admitting: Family Medicine

## 2018-12-07 ENCOUNTER — Other Ambulatory Visit: Payer: Self-pay

## 2018-12-07 ENCOUNTER — Ambulatory Visit: Payer: Self-pay | Admitting: Family Medicine

## 2018-12-07 VITALS — BP 126/79 | Ht 67.0 in | Wt 127.0 lb

## 2018-12-07 DIAGNOSIS — F332 Major depressive disorder, recurrent severe without psychotic features: Secondary | ICD-10-CM

## 2018-12-07 DIAGNOSIS — E785 Hyperlipidemia, unspecified: Secondary | ICD-10-CM | POA: Diagnosis not present

## 2018-12-07 DIAGNOSIS — F411 Generalized anxiety disorder: Secondary | ICD-10-CM

## 2018-12-07 DIAGNOSIS — J3489 Other specified disorders of nose and nasal sinuses: Secondary | ICD-10-CM | POA: Diagnosis not present

## 2018-12-07 DIAGNOSIS — H9313 Tinnitus, bilateral: Secondary | ICD-10-CM

## 2018-12-07 DIAGNOSIS — E119 Type 2 diabetes mellitus without complications: Secondary | ICD-10-CM | POA: Diagnosis not present

## 2018-12-07 DIAGNOSIS — F5105 Insomnia due to other mental disorder: Secondary | ICD-10-CM

## 2018-12-07 MED ORDER — CHLORPHENIRAMINE MALEATE 4 MG PO TABS: ORAL_TABLET | ORAL | 0 refills | Status: DC

## 2018-12-07 MED ORDER — MONTELUKAST SODIUM 10 MG PO TABS: 10.0000 mg | ORAL_TABLET | Freq: Every day | ORAL | 3 refills | Status: DC

## 2018-12-07 NOTE — Patient Instructions (Addendum)
Wellness past due, please schedule  F/U in office with MD end September, call if you need me before  Please get fasting lipid, cmp and eGFR 1 week before September appointment  You are referred to Dr Erik Obey ,  Per your request   You are referred to Dr Modesta Messing  It is important that you exercise regularly at least 30 minutes 5 times a week. If you develop chest pain, have severe difficulty breathing, or feel very tired, stop exercising immediately and seek medical attention   Social distancing. Frequent hand washing with soap and water Keeping your hands off of your face. These 3 practices will help to keep both you and your community healthy during this time. Please practice them faithfully!   Thanks for choosing [facility], we consider it a privelige to serve you.

## 2018-12-07 NOTE — Assessment & Plan Note (Addendum)
Ongoing x 4 month, intermittently, needs ENT, and requests Dr Erik Obey, will refer, states symptoms worse in right ear, denies hearing loss

## 2018-12-07 NOTE — Progress Notes (Signed)
Virtual Visit via Telephone Note  I connected with Tamara Lam on 12/07/18 at  3:00 PM EDT by telephone and verified that I am speaking with the correct person using two identifiers.  Location: Patient: home  Provider: office   I discussed the limitations, risks, security and privacy concerns of performing an evaluation and management service by telephone and the availability of in person appointments. I also discussed with the patient that there may be a patient responsible charge related to this service. The patient expressed understanding and agreed to proceed. This visit type is conducted due to national recommendations for restrictions regarding the COVID -19 Pandemic. Due to the patient's age and / or co morbidities, this format is felt to be most appropriate at this time without adequate follow up. The patient has no access to video technology/ had technical difficulties with video, requiring transitioning to audio format  only ( telephone ). All issues noted this document were discussed and addressed,no physical exam can be performed in this format.    History of Present Illness: C/o buzzing in right ear for over 3 months, despite treatment no improve,ment , needs ENT eval C/o increased and uncontrolled anxiety, did not tolerate zoloft 50 mg and has reduced dose of 25 mg, states made her depressed, but she is tired of feeling anxious, it is exhausting, was hospitalized in the past  For mental health reasons, now willing to see Psychiatry. Is seeing therapist, uncertain as to the benefit. Denies recent fever or chills. Denies sinus pressure, nasal congestion, ear pain or sore throat. Denies chest congestion, productive cough or wheezing. Denies chest pains, palpitations and leg swelling Denies abdominal pain, nausea, vomiting,diarrhea or constipation.   Denies dysuria, frequency, hesitancy or incontinence. Denies joint pain, swelling and limitation in mobility. Denies headaches,  seizures, numbness, or tingling. .Denies skin break down or rash.       Observations/Objective: BP 126/79   Ht 5\' 7"  (1.702 m)   Wt 127 lb (57.6 kg)   BMI 19.89 kg/m  Good communication with no confusion and intact memory. Alert and oriented x 3 No signs of respiratory distress during speech    Assessment and Plan: Buzzing in ear, bilateral Ongoing x 4 month, intermittently, needs ENT, and requests Dr Erik Obey, will refer, states symptoms worse in right ear, denies hearing loss  Depression, major, recurrent ([place]) Inadequately  Treated and intolerant of multiple meds, refer to Psych  GAD (generalized anxiety disorder) Refer to Psyh, past h/o hospitalization for mental health   Insomnia related to another mental disorder Sleep hygiene reviewed  Prescription sent for  medication needed.     Follow Up Instructions:    I discussed the assessment and treatment plan with the patient. The patient was provided an opportunity to ask questions and all were answered. The patient agreed with the plan and demonstrated an understanding of the instructions.   The patient was advised to call back or seek an in-person evaluation if the symptoms worsen or if the condition fails to improve as anticipated.  I provided 25  minutes of non-face-to-face time during this encounter.   Tula Nakayama, MD

## 2018-12-12 ENCOUNTER — Encounter: Payer: Self-pay | Admitting: Family Medicine

## 2018-12-12 NOTE — Assessment & Plan Note (Signed)
Refer to Psyh, past h/o hospitalization for mental health

## 2018-12-12 NOTE — Assessment & Plan Note (Signed)
Sleep hygiene reviewed  Prescription sent for  medication needed.

## 2018-12-12 NOTE — Assessment & Plan Note (Signed)
Inadequately  Treated and intolerant of multiple meds, refer to Psych

## 2018-12-14 ENCOUNTER — Other Ambulatory Visit: Payer: Self-pay

## 2018-12-14 ENCOUNTER — Encounter: Payer: Self-pay | Admitting: Family Medicine

## 2018-12-14 ENCOUNTER — Ambulatory Visit (INDEPENDENT_AMBULATORY_CARE_PROVIDER_SITE_OTHER): Payer: Medicare Other | Admitting: Family Medicine

## 2018-12-14 VITALS — BP 148/82 | HR 54 | Temp 98.4°F | Resp 12 | Ht 67.0 in | Wt 128.0 lb

## 2018-12-14 DIAGNOSIS — Z0000 Encounter for general adult medical examination without abnormal findings: Secondary | ICD-10-CM | POA: Diagnosis not present

## 2018-12-14 NOTE — Patient Instructions (Addendum)
Tamara Lam , Thank you for taking time to come for your Medicare Wellness Visit. I appreciate your ongoing commitment to your health goals. Please review the following plan we discussed and let me know if I can assist you in the future.   Screening recommendations/referrals: Colonoscopy: Due 2027 Mammogram: Due July 2020 Bone Density: Completed  Recommended yearly ophthalmology/optometry visit for glaucoma screening and checkup Recommended yearly dental visit for hygiene and checkup  Vaccinations: Influenza vaccine: Due Fall 2020 Pneumococcal vaccine: Completed  Tdap vaccine: Up to date until 2021 Shingles vaccine: Insurance coverage-check  Advanced directives: Does have living will   Conditions/risks identified: Reviewed at visit. Please read info below for preventive care   Next appointment: 03/28/2019   All patients should ensure an adequate intake of dietary calcium (1200 mg/d) and vitamin D (800 IU daily) unless contraindicated. Please break this up into 2 or 3 doses daily, as the body can not absorb more  Than 500 mg of calcium at a time.   Preventive Care 68 Years and Older, Female Preventive care refers to lifestyle choices and visits with your health care provider that can promote health and wellness. What does preventive care include?  A yearly physical exam. This is also called an annual well check.  Dental exams once or twice a year.  Routine eye exams. Ask your health care provider how often you should have your eyes checked.  Personal lifestyle choices, including:  Daily care of your teeth and gums.  Regular physical activity.  Eating a healthy diet.  Avoiding tobacco and drug use.  Limiting alcohol use.  Practicing safe sex.  Taking low-dose aspirin every day.  Taking vitamin and mineral supplements as recommended by your health care provider. What happens during an annual well check? The services and screenings done by your health care provider  during your annual well check will depend on your age, overall health, lifestyle risk factors, and family history of disease. Counseling  Your health care provider may ask you questions about your:  Alcohol use.  Tobacco use.  Drug use.  Emotional well-being.  Home and relationship well-being.  Sexual activity.  Eating habits.  History of falls.  Memory and ability to understand (cognition).  Work and work Statistician.  Reproductive health. Screening  You may have the following tests or measurements:  Height, weight, and BMI.  Blood pressure.  Lipid and cholesterol levels. These may be checked every 5 years, or more frequently if you are over 84 years old.  Skin check.  Lung cancer screening. You may have this screening every year starting at age 20 if you have a 30-pack-year history of smoking and currently smoke or have quit within the past 15 years.  Fecal occult blood test (FOBT) of the stool. You may have this test every year starting at age 21.  Flexible sigmoidoscopy or colonoscopy. You may have a sigmoidoscopy every 5 years or a colonoscopy every 10 years starting at age 53.  Hepatitis C blood test.  Hepatitis B blood test.  Sexually transmitted disease (STD) testing.  Diabetes screening. This is done by checking your blood sugar (glucose) after you have not eaten for a while (fasting). You may have this done every 1-3 years.  Bone density scan. This is done to screen for osteoporosis. You may have this done starting at age 75.  Mammogram. This may be done every 1-2 years. Talk to your health care provider about how often you should have regular mammograms. Talk with your  health care provider about your test results, treatment options, and if necessary, the need for more tests. Vaccines  Your health care provider may recommend certain vaccines, such as:  Influenza vaccine. This is recommended every year.  Tetanus, diphtheria, and acellular pertussis  (Tdap, Td) vaccine. You may need a Td booster every 10 years.  Zoster vaccine. You may need this after age 18.  Pneumococcal 13-valent conjugate (PCV13) vaccine. One dose is recommended after age 9.  Pneumococcal polysaccharide (PPSV23) vaccine. One dose is recommended after age 70. Talk to your health care provider about which screenings and vaccines you need and how often you need them. This information is not intended to replace advice given to you by your health care provider. Make sure you discuss any questions you have with your health care provider. Document Released: 07/13/2015 Document Revised: 03/05/2016 Document Reviewed: 04/17/2015 Elsevier Interactive Patient Education  2017 [employer] Prevention in the Home Falls can cause injuries. They can happen to people of all ages. There are many things you can do to make your home safe and to help prevent falls. What can I do on the outside of my home?  Regularly fix the edges of walkways and driveways and fix any cracks.  Remove anything that might make you trip as you walk through a door, such as a raised step or threshold.  Trim any bushes or trees on the path to your home.  Use bright outdoor lighting.  Clear any walking paths of anything that might make someone trip, such as rocks or tools.  Regularly check to see if handrails are loose or broken. Make sure that both sides of any steps have handrails.  Any raised decks and porches should have guardrails on the edges.  Have any leaves, snow, or ice cleared regularly.  Use sand or salt on walking paths during winter.  Clean up any spills in your garage right away. This includes oil or grease spills. What can I do in the bathroom?  Use night lights.  Install grab bars by the toilet and in the tub and shower. Do not use towel bars as grab bars.  Use non-skid mats or decals in the tub or shower.  If you need to sit down in the shower, use a plastic, non-slip  stool.  Keep the floor dry. Clean up any water that spills on the floor as soon as it happens.  Remove soap buildup in the tub or shower regularly.  Attach bath mats securely with double-sided non-slip rug tape.  Do not have throw rugs and other things on the floor that can make you trip. What can I do in the bedroom?  Use night lights.  Make sure that you have a light by your bed that is easy to reach.  Do not use any sheets or blankets that are too big for your bed. They should not hang down onto the floor.  Have a firm chair that has side arms. You can use this for support while you get dressed.  Do not have throw rugs and other things on the floor that can make you trip. What can I do in the kitchen?  Clean up any spills right away.  Avoid walking on wet floors.  Keep items that you use a lot in easy-to-reach places.  If you need to reach something above you, use a strong step stool that has a grab bar.  Keep electrical cords out of the way.  Do not use  floor polish or wax that makes floors slippery. If you must use wax, use non-skid floor wax.  Do not have throw rugs and other things on the floor that can make you trip. What can I do with my stairs?  Do not leave any items on the stairs.  Make sure that there are handrails on both sides of the stairs and use them. Fix handrails that are broken or loose. Make sure that handrails are as long as the stairways.  Check any carpeting to make sure that it is firmly attached to the stairs. Fix any carpet that is loose or worn.  Avoid having throw rugs at the top or bottom of the stairs. If you do have throw rugs, attach them to the floor with carpet tape.  Make sure that you have a light switch at the top of the stairs and the bottom of the stairs. If you do not have them, ask someone to add them for you. What else can I do to help prevent falls?  Wear shoes that:  Do not have high heels.  Have rubber bottoms.  Are  comfortable and fit you well.  Are closed at the toe. Do not wear sandals.  If you use a stepladder:  Make sure that it is fully opened. Do not climb a closed stepladder.  Make sure that both sides of the stepladder are locked into place.  Ask someone to hold it for you, if possible.  Clearly mark and make sure that you can see:  Any grab bars or handrails.  First and last steps.  Where the edge of each step is.  Use tools that help you move around (mobility aids) if they are needed. These include:  Canes.  Walkers.  Scooters.  Crutches.  Turn on the lights when you go into a dark area. Replace any light bulbs as soon as they burn out.  Set up your furniture so you have a clear path. Avoid moving your furniture around.  If any of your floors are uneven, fix them.  If there are any pets around you, be aware of where they are.  Review your medicines with your doctor. Some medicines can make you feel dizzy. This can increase your chance of falling. Ask your doctor what other things that you can do to help prevent falls. This information is not intended to replace advice given to you by your health care provider. Make sure you discuss any questions you have with your health care provider. Document Released: 04/12/2009 Document Revised: 11/22/2015 Document Reviewed: 07/21/2014 Elsevier Interactive Patient Education  2017 [employer].

## 2018-12-14 NOTE — Progress Notes (Signed)
Subjective:   Tamara Lam is a 68 y.o. female who presents for Medicare Annual (Subsequent) preventive examination.  Review of Systems:    Cardiac Risk Factors include: advanced age (>12men, >57 women);dyslipidemia     Objective:     Vitals: BP (!) 148/82   Pulse (!) 54   Temp 98.4 F (36.9 C) (Temporal)   Resp 12   Ht 5\' 7"  (1.702 m)   Wt 128 lb 0.6 oz (58.1 kg)   SpO2 99%   BMI 20.05 kg/m   Body mass index is 20.05 kg/m.  Advanced Directives 03/03/2018 09/28/2017 07/23/2016 07/22/2016 07/09/2016 07/04/2016 06/24/2016  Does Patient Have a Medical Advance Directive? Yes Yes Yes Yes Yes Yes Yes  Type of Advance Directive Living will - [place];Living will [place];Living will [place];Living will [place];Living will [place];Living will  Does patient want to make changes to medical advance directive? - No - Patient declined No - Patient declined No - Patient declined - - -  Copy of [place] in Chart? - - Yes Yes Yes Yes Yes  Would patient like information on creating a medical advance directive? - - No - Patient declined - No - Patient declined No - Patient declined No - Patient declined  Pre-existing out of facility DNR order (yellow form or pink MOST form) - - - - - - -    Tobacco Social History   Tobacco Use  Smoking Status Never Smoker  Smokeless Tobacco Never Used  Tobacco Comment   Never smoked     Counseling given: Yes Comment: Never smoked   Clinical Intake:  Pre-visit preparation completed: Yes  Pain : No/denies pain Pain Score: 0-No pain     BMI - recorded: 20.05 Nutritional Status: BMI of 19-24  Normal Nutritional Risks: None Diabetes: No  How often do you need to have someone help you when you read instructions, pamphlets, or other written materials from your doctor or pharmacy?: 1 - Never What is the last grade level you  completed in school?: 11  Interpreter Needed?: No     Past Medical History:  Diagnosis Date  . Abnormal CXR (chest x-ray)    Evaluated with Pulmonary- Dr. Luan Pulling- "pt. was told probably due to thin body frame"-saw no issues-PFT test normal.  . Anxiety 1980's   . Arthritis    osteoarthritis -knees, hands  . Chronic constipation    tx. Linzess  . Chronic nausea   . Diabetes mellitus without complication ([place])    J2I level checked borderline x1, then further evaluated -Diabetes ruled out.  . Glaucoma 2004   Dr. Venetia Maxon, laser to left eye Sept 2011, bilateral eye drops for this  . Helicobacter pylori gastritis 04/2004   last EGD   . Hematochezia   . Hx of colonoscopy 08/2005   Dr. Gala Romney / hemorrhoids   . Thyroid disease    thyroid goiter, nodules-no problems.  . Weight loss    Past Surgical History:  Procedure Laterality Date  . ABDOMINAL HYSTERECTOMY    . arthoscopic surgery on right knee  2007  . BIOPSY  03/03/2018   Procedure: BIOPSY;  Surgeon: Daneil Dolin, MD;  Location: [facility];  Service: Endoscopy;;  gastric for h. pylori  . CHOLECYSTECTOMY  1990's  . COLONOSCOPY  2011   Dr. Gala Romney: anal canal/internal hemorrhoids, dimintuive rectosigmoid polyp (polypoid rectal mucosa), few sigmoid diverticula   . COLONOSCOPY  N/A 10/30/2015   Dr. Gala Romney: diverticulosis, hemorrhoids, surveillance in 2022   . ESOPHAGOGASTRODUODENOSCOPY  2005   Dr. Gala Romney: diffuse submucosal petechial hemorrhage of te gastric mucosa, some noncompliance of gastric cavity, failure to insufflate fully, multiple gastric biopsies. gastric bx, h.pylori  . ESOPHAGOGASTRODUODENOSCOPY N/A 03/03/2018   Dr. Gala Romney: scattered antral erosions/erythema no ulcer/mass. Bx: mild inflammation and No H.pylori  . TOTAL KNEE ARTHROPLASTY Right 05/26/2016   Procedure: RIGHT TOTAL KNEE ARTHROPLASTY;  Surgeon: Gaynelle Arabian, MD;  Location: [facility];  Service: Orthopedics;  Laterality: Right;  Marland Kitchen VESICOVAGINAL FISTULA CLOSURE W/  TAH  1989   Family History  Problem Relation Age of Onset  . Hypertension Mother   . Glaucoma Mother   . Diverticulosis Mother   . Heart disease Mother   . Lung cancer Father   . Mental illness Sister   . Depression Sister   . Anxiety disorder Sister   . Cancer Sister   . Depression Sister   . Anxiety disorder Sister   . Depression Sister   . Anxiety disorder Sister   . Leukemia Brother        some form of leukemia  . Colon polyps Son   . Depression Daughter   . Colon cancer Neg Hx    Social History   Socioeconomic History  . Marital status: Widowed    Spouse name: Not on file  . Number of children: Not on file  . Years of education: Not on file  . Highest education level: Not on file  Occupational History  . Occupation: homemaker     Employer: UNEMPLOYED  Social Needs  . Financial resource strain: Not hard at all  . Food insecurity    Worry: Never true    Inability: Never true  . Transportation needs    Medical: No    Non-medical: No  Tobacco Use  . Smoking status: Never Smoker  . Smokeless tobacco: Never Used  . Tobacco comment: Never smoked  Substance and Sexual Activity  . Alcohol use: No    Alcohol/week: 0.0 standard drinks  . Drug use: No  . Sexual activity: Not Currently  Lifestyle  . Physical activity    Days per week: 4 days    Minutes per session: 50 min  . Stress: Only a little  Relationships  . Social connections    Talks on phone: More than three times a week    Gets together: Once a week    Attends religious service: More than 4 times per year    Active member of club or organization: Yes    Attends meetings of clubs or organizations: More than 4 times per year    Relationship status: Widowed  Other Topics Concern  . Not on file  Social History Narrative  . Not on file    Outpatient Encounter Medications as of 12/14/2018  Medication Sig  . Calcium-Magnesium-Vitamin D (CALCIUM 1200+D3 PO) Take 1 tablet by mouth daily.  .  chlorpheniramine (CHLOR-TRIMETON) 4 MG tablet Take one tablet once daily by mouth for 4 days , then as needed  . hydrOXYzine (ATARAX/VISTARIL) 10 MG tablet Take one tablet at bedtime  . linaclotide (LINZESS) 290 MCG CAPS capsule Take 1 capsule (290 mcg total) by mouth daily before breakfast. (Patient taking differently: Take 290 mcg by mouth as needed. )  . montelukast (SINGULAIR) 10 MG tablet Take 1 tablet (10 mg total) by mouth at bedtime.  . Multiple Vitamins-Minerals (CENTRUM ADULTS) TABS Take 1 tablet by mouth daily.  Marland Kitchen  sertraline (ZOLOFT) 50 MG tablet Take 1 tablet (50 mg total) by mouth daily.  . simvastatin (ZOCOR) 20 MG tablet TAKE 1 TABLET BY MOUTH AT  BEDTIME FOR CHOLESTEROL  . travoprost, benzalkonium, (TRAVATAN) 0.004 % ophthalmic solution Place 1 drop into both eyes at bedtime.    . vitamin C (ASCORBIC ACID) 500 MG tablet Take 500 mg by mouth daily.   No facility-administered encounter medications on file as of 12/14/2018.     Activities of Daily Living In your present state of health, do you have any difficulty performing the following activities: 12/14/2018  Hearing? N  Vision? Y  Comment has glaucoma, but is controlled more age related  Difficulty concentrating or making decisions? Y  Walking or climbing stairs? N  Dressing or bathing? N  Doing errands, shopping? N  Preparing Food and eating ? N  Using the Toilet? N  In the past six months, have you accidently leaked urine? N  Do you have problems with loss of bowel control? N  Managing your Finances? N  Housekeeping or managing your Housekeeping? N  Some recent data might be hidden    Patient Care Team: Fayrene Helper, MD as PCP - General Rourk, Cristopher Estimable, MD as Consulting Physician (Gastroenterology) Cassandria Anger, MD as Consulting Physician (Endocrinology)    Assessment:   This is a routine wellness examination for Lis.  Exercise Activities and Dietary recommendations Current Exercise Habits:  Home exercise routine, Type of exercise: walking, Time (Minutes): 45, Frequency (Times/Week): 3, Weekly Exercise (Minutes/Week): 135, Intensity: Moderate, Exercise limited by: None identified  Goals    . Gain weight     Starting 07/23/2016 patient would like to gain 8 lbs to equal 125 lbs and be able to maintain that.       Fall Risk Fall Risk  12/14/2018 12/07/2018 08/10/2018 06/15/2018 06/15/2018  Falls in the past year? 0 0 0 0 0  Number falls in past yr: - 0 - - -  Injury with Fall? 0 0 0 - -   Is the patient's home free of loose throw rugs in walkways, pet beds, electrical cords, etc?   yes      Grab bars in the bathroom? yes      Handrails on the stairs?   yes      Adequate lighting?   yes     Depression Screen PHQ 2/9 Scores 12/14/2018 12/07/2018 08/10/2018 08/10/2018  PHQ - 2 Score 1 2 2 3   PHQ- 9 Score 7 10 11 8      Cognitive Function     6CIT Screen 12/14/2018 09/28/2017 07/22/2016  What Year? 0 points 0 points 0 points  What month? 0 points 0 points 0 points  What time? 0 points 0 points 0 points  Count back from 20 0 points 0 points 0 points  Months in reverse 0 points 0 points 0 points  Repeat phrase 2 points 0 points 0 points  Total Score 2 0 0    Immunization History  Administered Date(s) Administered  . Influenza Whole 03/13/2010, 03/12/2011  . Influenza,inj,Quad PF,6+ Mos 04/24/2013, 02/20/2014, 03/27/2015, 04/28/2016, 04/14/2017, 02/24/2018  . Pneumococcal Conjugate-13 06/20/2014  . Pneumococcal Polysaccharide-23 05/12/2016  . Td 08/16/2009  . Zoster 02/17/2011    Qualifies for Shingles Vaccine? Insurance does not cover per pt  Screening Tests Health Maintenance  Topic Date Due  . OPHTHALMOLOGY EXAM  04/12/2015  . FOOT EXAM  05/19/2019 (Originally 11/14/2016)  . URINE MICROALBUMIN  12/16/2019 (Originally 11/12/2016)  .  INFLUENZA VACCINE  01/29/2019  . HEMOGLOBIN A1C  02/01/2019  . TETANUS/TDAP  08/17/2019  . MAMMOGRAM  12/02/2019  . COLONOSCOPY   10/29/2025  . DEXA SCAN  Completed  . Hepatitis C Screening  Completed  . PNA vac Low Risk Adult  Completed    Cancer Screenings: Lung: Low Dose CT Chest recommended if Age 69-80 years, 30 pack-year currently smoking OR have quit w/in 15years. Patient does not qualify. Breast:  Up to date on Mammogram? Yes   Up to date of Bone Density/Dexa? Yes Colorectal:   Due in 2027   Additional Screenings:   Hepatitis C Screening: completed      Plan:      1. Encounter for Medicare annual wellness exam   I have personally reviewed and noted the following in the patient's chart:   . Medical and social history . Use of alcohol, tobacco or illicit drugs  . Current medications and supplements . Functional ability and status . Nutritional status . Physical activity . Advanced directives . List of other physicians . Hospitalizations, surgeries, and ER visits in previous 12 months . Vitals . Screenings to include cognitive, depression, and falls . Referrals and appointments  In addition, I have reviewed and discussed with patient certain preventive protocols, quality metrics, and best practice recommendations. A written personalized care plan for preventive services as well as general preventive health recommendations were provided to patient.     Perlie Mayo, NP  12/14/2018

## 2018-12-28 ENCOUNTER — Other Ambulatory Visit: Payer: Self-pay | Admitting: Family Medicine

## 2018-12-29 ENCOUNTER — Other Ambulatory Visit: Payer: Self-pay | Admitting: Family Medicine

## 2018-12-31 ENCOUNTER — Telehealth: Payer: Self-pay | Admitting: Family Medicine

## 2018-12-31 NOTE — Telephone Encounter (Signed)
Pls let pt know that I HAVE A MESSAGE FROM [facility]YCH THAT DESPITE MULTIPLE AtTEMPTS,TO CONTACTCH HER SHE HAS NOT RESPONDED SO NO APPT HAS BEEN MADE WITH PSYCH AS SHE HAD DECIDED ON, SHE WILL NEED TO REACH OUT TO THAT OFFICE IF SHE WANTS TO GO, THANKS

## 2019-01-03 ENCOUNTER — Ambulatory Visit: Payer: Medicare Other

## 2019-01-04 ENCOUNTER — Ambulatory Visit
Admission: RE | Admit: 2019-01-04 | Discharge: 2019-01-04 | Disposition: A | Discharge status: Home / Self Care | Payer: Medicare Other | Source: Ambulatory Visit | Attending: Family Medicine | Admitting: Family Medicine

## 2019-01-04 DIAGNOSIS — Z1231 Encounter for screening mammogram for malignant neoplasm of breast: Secondary | ICD-10-CM

## 2019-01-05 NOTE — Telephone Encounter (Signed)
Called pt with this information she stated she would get in touch with them today.

## 2019-01-12 NOTE — Progress Notes (Signed)
Virtual Visit via Video Note  I connected with Tamara Lam on 01/17/19 at  3:30 PM EDT by a video enabled telemedicine application and verified that I am speaking with the correct person using two identifiers.   I discussed the limitations of evaluation and management by telemedicine and the availability of in person appointments. The patient expressed understanding and agreed to proceed.      I discussed the assessment and treatment plan with the patient. The patient was provided an opportunity to ask questions and all were answered. The patient agreed with the plan and demonstrated an understanding of the instructions.   The patient was advised to call back or seek an in-person evaluation if the symptoms worsen or if the condition fails to improve as anticipated.  I provided 50 minutes of non-face-to-face time during this encounter.   Norman Clay, MD      Psychiatric Initial Adult Assessment   Patient Identification: Tamara Lam MRN:  [id number] Date of Evaluation:  01/17/2019 Referral Source: Fayrene Helper, MD Chief Complaint:  "Only the medication relax me is valium or inderal" Visit Diagnosis:    ICD-10-CM   1. MDD (major depressive disorder), recurrent episode, moderate (HCC)  F33.1     History of Present Illness:   Tamara Lam is a 68 y.o. year old female with a history of depression, anxiety, Nontoxic multinodular goiter, diabetes, who is referred for depression.   She states that she was advised by her PCP to come here for depression and anxiety.  She believes it has worsened "all of a sudden" over the past several years.  She cannot think of any triggers to make her feel this way. She feels like she "don't have a life." She states that she stopped going to [facility] due to pandemic. She takes a walk instead. Although she initially states that she would do more if there is no issues with pandemic, she later states mild anhedonia. She enjoys meeting with her  daughter. She reports good relationship with her son at home. Although it was  "hard" to loss her husband in 2012, she handles it "one day at a time."   She has fair sleep.  She has mild anhedonia.  She feels depressed.  She feels fatigue at times.  She has fair concentration.  She denies SI.  She feels anxious and tense at times, especially when she is around with people.  She also reports that she was very anxious before starting this visit as she is not family with technology. Although she signed up for computer class, she did not go as she was afraid of what people may think of her due to lack of her skills. 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 She tends to feel more anxious when she is around with people she does not know. This is relatively new for the patient. She denies panic attacks. She denies alcohol use or drug use.   Medication- sertraline since March (according to the patient, she felt more depressed when she tried 50 mg, and reduced the dose to 25 mg daily)  Associated Signs/Symptoms: Depression Symptoms:  depressed mood, fatigue, anxiety, (Hypo) Manic Symptoms:  denies decreaesd need for sleep, euphoria Anxiety Symptoms:  mild anxiety Psychotic Symptoms:  denies AH, VH, paranoia PTSD Symptoms: Negative  Past Psychiatric History:  Outpatient: depression since 1980's Psychiatry admission: [facility] in 1980's Previous suicide attempt: denies  Past trials of medication: sertraline (more depressed, anxious),  fluoxetine, buspirone,  mirtazapine,  Inderal, valium,  History of violence: denies   Previous Psychotropic Medications: Yes   Substance Abuse History in the last 12 months:  No.  Consequences of Substance Abuse: NA  Past Medical History:  Past Medical History:  Diagnosis Date  . Abnormal CXR (chest x-ray)    Evaluated with Pulmonary- Dr. Luan Pulling- "pt. was told probably due to thin body frame"-saw no issues-PFT test normal.  .  Anxiety 1980's   . Arthritis    osteoarthritis -knees, hands  . Chronic constipation    tx. Linzess  . Chronic nausea   . Diabetes mellitus without complication ([place])    M3N level checked borderline x1, then further evaluated -Diabetes ruled out.  . Glaucoma 2004   Dr. Venetia Maxon, laser to left eye Sept 2011, bilateral eye drops for this  . Helicobacter pylori gastritis 04/2004   last EGD   . Hematochezia   . Hx of colonoscopy 08/2005   Dr. Gala Romney / hemorrhoids   . Thyroid disease    thyroid goiter, nodules-no problems.  . Weight loss     Past Surgical History:  Procedure Laterality Date  . ABDOMINAL HYSTERECTOMY    . arthoscopic surgery on right knee  2007  . BIOPSY  03/03/2018   Procedure: BIOPSY;  Surgeon: Daneil Dolin, MD;  Location: [facility];  Service: Endoscopy;;  gastric for h. pylori  . CHOLECYSTECTOMY  1990's  . COLONOSCOPY  2011   Dr. Gala Romney: anal canal/internal hemorrhoids, dimintuive rectosigmoid polyp (polypoid rectal mucosa), few sigmoid diverticula   . COLONOSCOPY N/A 10/30/2015   Dr. Gala Romney: diverticulosis, hemorrhoids, surveillance in 2022   . ESOPHAGOGASTRODUODENOSCOPY  2005   Dr. Gala Romney: diffuse submucosal petechial hemorrhage of te gastric mucosa, some noncompliance of gastric cavity, failure to insufflate fully, multiple gastric biopsies. gastric bx, h.pylori  . ESOPHAGOGASTRODUODENOSCOPY N/A 03/03/2018   Dr. Gala Romney: scattered antral erosions/erythema no ulcer/mass. Bx: mild inflammation and No H.pylori  . TOTAL KNEE ARTHROPLASTY Right 05/26/2016   Procedure: RIGHT TOTAL KNEE ARTHROPLASTY;  Surgeon: Gaynelle Arabian, MD;  Location: [facility];  Service: Orthopedics;  Laterality: Right;  Marland Kitchen VESICOVAGINAL FISTULA CLOSURE W/ TAH  1989    Family Psychiatric History:  As below. Her second cousin completed suicide  Family History:  Family History  Problem Relation Age of Onset  . Hypertension Mother   . Glaucoma Mother   . Diverticulosis Mother   . Heart disease  Mother   . Lung cancer Father   . Mental illness Sister   . Depression Sister   . Anxiety disorder Sister   . Cancer Sister   . Depression Sister   . Anxiety disorder Sister   . Depression Sister   . Anxiety disorder Sister   . Leukemia Brother        some form of leukemia  . Colon polyps Son   . Depression Daughter   . Colon cancer Neg Hx     Social History:   Social History   Socioeconomic History  . Marital status: Widowed    Spouse name: Not on file  . Number of children: Not on file  . Years of education: Not on file  . Highest education level: Not on file  Occupational History  . Occupation: homemaker     Employer: UNEMPLOYED  Social Needs  . Financial resource strain: Not hard at all  . Food insecurity    Worry: Never true    Inability: Never true  . Transportation needs  Medical: No    Non-medical: No  Tobacco Use  . Smoking status: Never Smoker  . Smokeless tobacco: Never Used  . Tobacco comment: Never smoked  Substance and Sexual Activity  . Alcohol use: No    Alcohol/week: 0.0 standard drinks  . Drug use: No  . Sexual activity: Not Currently  Lifestyle  . Physical activity    Days per week: 4 days    Minutes per session: 50 min  . Stress: Only a little  Relationships  . Social connections    Talks on phone: More than three times a week    Gets together: Once a week    Attends religious service: More than 4 times per year    Active member of club or organization: Yes    Attends meetings of clubs or organizations: More than 4 times per year    Relationship status: Widowed  Other Topics Concern  . Not on file  Social History Narrative  . Not on file    Additional Social History:  Widow after 26 years of marriage, her husband deceased in 06-01-2011, she has three children, she has close relationship with them Her older son, age 18 lives with the patient Work: unemployed. She used to work for [employer], [place] for five years,  last in 2012 Education: 11th grade  Allergies:  No Known Allergies  Metabolic Disorder Labs: Lab Results  Component Value Date   HGBA1C 5.8 (H) 08/03/2018   MPG 114 05/05/2016   No results found for: PROLACTIN Lab Results  Component Value Date   CHOL 175 08/03/2018   TRIG 76 08/03/2018   HDL 71 08/03/2018   CHOLHDL 2.6 02/20/2018   VLDL 13 05/05/2016   LDLCALC 89 08/03/2018   LDLCALC 93 02/20/2018   Lab Results  Component Value Date   TSH 0.511 08/03/2018    Therapeutic Level Labs: No results found for: LITHIUM No results found for: CBMZ No results found for: VALPROATE  Current Medications: Current Outpatient Medications  Medication Sig Dispense Refill  . Calcium-Magnesium-Vitamin D (CALCIUM 1200+D3 PO) Take 1 tablet by mouth daily.    . chlorpheniramine (CHLOR-TRIMETON) 4 MG tablet Take one tablet once daily by mouth for 4 days , then as needed 20 tablet 0  . hydrOXYzine (ATARAX/VISTARIL) 10 MG tablet Take one tablet at bedtime 30 tablet 4  . linaclotide (LINZESS) 290 MCG CAPS capsule Take 1 capsule (290 mcg total) by mouth daily before breakfast. (Patient taking differently: Take 290 mcg by mouth as needed. ) 90 capsule 3  . montelukast (SINGULAIR) 10 MG tablet Take 1 tablet (10 mg total) by mouth at bedtime. 30 tablet 3  . Multiple Vitamins-Minerals (CENTRUM ADULTS) TABS Take 1 tablet by mouth daily.    . sertraline (ZOLOFT) 50 MG tablet TAKE 1 TABLET BY MOUTH ONCE A DAY. 30 tablet 0  . simvastatin (ZOCOR) 20 MG tablet TAKE 1 TABLET BY MOUTH AT  BEDTIME FOR CHOLESTEROL 90 tablet 1  . travoprost, benzalkonium, (TRAVATAN) 0.004 % ophthalmic solution Place 1 drop into both eyes at bedtime.      . vitamin C (ASCORBIC ACID) 500 MG tablet Take 500 mg by mouth daily.     No current facility-administered medications for this visit.     Musculoskeletal: Strength & Muscle Tone: N/A Gait & Station: N/A Patient leans: N/A  Psychiatric Specialty Exam: Review of Systems   Psychiatric/Behavioral: Positive for depression. Negative for hallucinations, memory loss, substance abuse and suicidal ideas. The patient is  nervous/anxious. The patient does not have insomnia.   All other systems reviewed and are negative.   There were no vitals taken for this visit.There is no height or weight on file to calculate BMI.  General Appearance: Fairly Groomed  Eye Contact:  Good  Speech:  Clear and Coherent  Volume:  Normal  Mood:  Anxious and Depressed  Affect:  Appropriate, Congruent and slightly restricted  Thought Process:  Coherent  Orientation:  Full (Time, Place, and Person)  Thought Content:  Logical  Suicidal Thoughts:  No  Homicidal Thoughts:  No  Memory:  Immediate;   Good  Judgement:  Good  Insight:  Fair  Psychomotor Activity:  Normal  Concentration:  Concentration: Good and Attention Span: Good  Recall:  Good  Fund of Knowledge:Good  Language: Good  Akathisia:  No  Handed:  Right  AIMS (if indicated):  not done  Assets:  Communication Skills Desire for Improvement  ADL's:  Intact  Cognition: WNL  Sleep:  Fair   Screenings: GAD-7     Office Visit from 12/07/2018 in [facility]  Total GAD-7 Score  20    PHQ2-9     Clinical Support from 12/14/2018 in [facility] Office Visit from 12/07/2018 in [facility] Office Visit from 08/10/2018 in [facility] Office Visit from 06/15/2018 in [facility] Office Visit from 02/24/2018 in [facility]  PHQ-2 Total Score  1  2  2  5  2   PHQ-9 Total Score  7  10  11  22  8       Assessment and Plan:  Tamara Lam is a 68 y.o. year old female with a history of depression, anxiety, Nontoxic multinodular goiter, diabetes, who is referred for depression.   # MDD, mild, recurrent without psychotic features # unspecified anxiety disorder Patient reports slight worsening in depressive symptoms and anxiety over the past few years.  Although she denies  any psychosocial stressors, she reports loss of her husband in 2012.  Will switch from sertraline to Lexapro given the patient reports her depression worsened after starting sertraline.  Discussed potential GI side effect.  She will greatly benefit from CBT; she is encouraged to continue to see Ms. Bynum for therapy.   Plan 1. Discontinue sertraline 2. Start lexapro 5 mg daily for one week, then 10 mg daily  3. Next appointment: 8/17 at 2 PM for 30 mins, video   The patient demonstrates the following risk factors for suicide: Chronic risk factors for suicide include: psychiatric disorder of depression, anxiety. Acute risk factors for suicide include: unemployment. Protective factors for this patient include: positive social support, coping skills and hope for the future. Considering these factors, the overall suicide risk at this point appears to be low. Patient is appropriate for outpatient follow up.   Norman Clay, MD 7/20/20204:06 PM

## 2019-01-17 ENCOUNTER — Other Ambulatory Visit: Payer: Self-pay

## 2019-01-17 ENCOUNTER — Ambulatory Visit (INDEPENDENT_AMBULATORY_CARE_PROVIDER_SITE_OTHER): Payer: Medicare Other | Admitting: Psychiatry

## 2019-01-17 ENCOUNTER — Encounter (HOSPITAL_COMMUNITY): Payer: Self-pay | Admitting: Psychiatry

## 2019-01-17 DIAGNOSIS — F419 Anxiety disorder, unspecified: Secondary | ICD-10-CM | POA: Diagnosis not present

## 2019-01-17 DIAGNOSIS — F330 Major depressive disorder, recurrent, mild: Secondary | ICD-10-CM

## 2019-01-17 MED ORDER — ESCITALOPRAM OXALATE 10 MG PO TABS: ORAL_TABLET | ORAL | 0 refills | Status: DC

## 2019-01-17 NOTE — Patient Instructions (Signed)
1. Discontinue sertraline 2. Start lexapro 5 mg daily for one week, then 10 mg daily  3. Next appointment: 8/17 at 2 PM

## 2019-01-19 ENCOUNTER — Other Ambulatory Visit: Payer: Self-pay

## 2019-01-19 ENCOUNTER — Encounter: Payer: Self-pay | Admitting: Gastroenterology

## 2019-01-19 ENCOUNTER — Ambulatory Visit: Payer: Medicare Other | Admitting: Gastroenterology

## 2019-01-19 VITALS — BP 138/78 | HR 87 | Temp 98.2°F | Ht 67.0 in | Wt 127.2 lb

## 2019-01-19 DIAGNOSIS — K649 Unspecified hemorrhoids: Secondary | ICD-10-CM

## 2019-01-19 DIAGNOSIS — K5909 Other constipation: Secondary | ICD-10-CM | POA: Diagnosis not present

## 2019-01-19 NOTE — Assessment & Plan Note (Signed)
Doing well on Linzess. Continue current dosage. Plan to see her back in early 2022 and will arrange for surveillance colonoscopy at that time. She will call in interim with any questions or concerns.

## 2019-01-19 NOTE — Progress Notes (Signed)
CC'ED TO PCP 

## 2019-01-19 NOTE — Progress Notes (Signed)
Primary Care Physician: Fayrene Helper, MD  Primary Gastroenterologist:  Garfield Cornea, MD   Chief Complaint  Patient presents with  . Constipation    doing well on Linzess 290 mcg, no abdominal pain to report    HPI: Tamara Lam is a 68 y.o. female here for follow-up.  She had mild reflux, constipation, left lower quadrant abdominal pain with no explanation on CT last year.  She was last seen in January 2020.  She has a history of mild gastritis noted on EGD in September 2019.  No H. Pylori.  Colonoscopy is up-to-date, due in 2022.  Weight has been stable. She feels well. No abd pain. No heartburn, vomiting, dysphagia. Nausea resolved after stopping Fosamax. BM regular with Linzess. Does not use daily but several times per week. No melena, brbpr. Has some anal itching at times. H/o hemorrhoids. Wonders if she has worms like when she was a child.   Current Outpatient Medications  Medication Sig Dispense Refill  . Calcium-Magnesium-Vitamin D (CALCIUM 1200+D3 PO) Take 1 tablet by mouth daily.    Marland Kitchen escitalopram (LEXAPRO) 10 MG tablet 5 mg daily for one week, then 10 mg daily 30 tablet 0  . hydrOXYzine (ATARAX/VISTARIL) 10 MG tablet Take one tablet at bedtime 30 tablet 4  . linaclotide (LINZESS) 290 MCG CAPS capsule Take 1 capsule (290 mcg total) by mouth daily before breakfast. (Patient taking differently: Take 290 mcg by mouth as needed. ) 90 capsule 3  . montelukast (SINGULAIR) 10 MG tablet Take 1 tablet (10 mg total) by mouth at bedtime. 30 tablet 3  . Multiple Vitamins-Minerals (CENTRUM ADULTS) TABS Take 1 tablet by mouth daily.    . simvastatin (ZOCOR) 20 MG tablet TAKE 1 TABLET BY MOUTH AT  BEDTIME FOR CHOLESTEROL 90 tablet 1  . travoprost, benzalkonium, (TRAVATAN) 0.004 % ophthalmic solution Place 1 drop into both eyes at bedtime.      . vitamin C (ASCORBIC ACID) 500 MG tablet Take 500 mg by mouth daily.     No current facility-administered medications for this visit.      Allergies as of 01/19/2019  . (No Known Allergies)    ROS:  General: Negative for anorexia, weight loss, fever, chills, fatigue, weakness. ENT: Negative for hoarseness, difficulty swallowing , nasal congestion. CV: Negative for chest pain, angina, palpitations, dyspnea on exertion, peripheral edema.  Respiratory: Negative for dyspnea at rest, dyspnea on exertion, cough, sputum, wheezing.  GI: See history of present illness. GU:  Negative for dysuria, hematuria, urinary incontinence, urinary frequency, nocturnal urination.  Endo: Negative for unusual weight change.    Physical Examination:   BP 138/78   Pulse 87   Temp 98.2 F (36.8 C) (Oral)   Ht 5\' 7"  (1.702 m)   Wt 127 lb 3.2 oz (57.7 kg)   BMI 19.92 kg/m   General: Well-nourished, well-developed in no acute distress.  Eyes: No icterus. Mouth: Oropharyngeal mucosa moist and pink , no lesions erythema or exudate. Lungs: Clear to auscultation bilaterally.  Heart: Regular rate and rhythm, no murmurs rubs or gallops.  Abdomen: Bowel sounds are normal, nontender, nondistended, no hepatosplenomegaly or masses, no abdominal bruits or hernia , no rebound or guarding.   Extremities: No lower extremity edema. No clubbing or deformities. Neuro: Alert and oriented x 4   Skin: Warm and dry, no jaundice.   Psych: Alert and cooperative, normal mood and affect.  Labs:  Lab Results  Component Value Date   CREATININE  0.71 08/03/2018   BUN 15 08/03/2018   NA 140 08/03/2018   K 3.9 08/03/2018   CL 101 08/03/2018   CO2 26 08/03/2018   Lab Results  Component Value Date   ALT 15 08/03/2018   AST 21 08/03/2018   ALKPHOS 46 08/03/2018   BILITOT 0.4 08/03/2018   Lab Results  Component Value Date   WBC 6.0 08/03/2018   HGB 13.5 08/03/2018   HCT 41.8 08/03/2018   MCV 89 08/03/2018   PLT 210 08/03/2018    Imaging Studies: Mm 3d Screen Breast Bilateral  Result Date: 01/04/2019 CLINICAL DATA:  Screening. EXAM: DIGITAL  SCREENING BILATERAL MAMMOGRAM WITH TOMO AND CAD COMPARISON:  Previous exam(s). ACR Breast Density Category b: There are scattered areas of fibroglandular density. FINDINGS: There are no findings suspicious for malignancy. Images were processed with CAD. IMPRESSION: No mammographic evidence of malignancy. A result letter of this screening mammogram will be mailed directly to the patient. RECOMMENDATION: Screening mammogram in one year. (Code:SM-B-01Y) BI-RADS CATEGORY  1: Negative. Electronically Signed   By: Abelardo Diesel M.D.   On: 01/04/2019 11:23

## 2019-01-19 NOTE — Assessment & Plan Note (Signed)
Anal itching likely due to hemorrhoids. Trial of Prep H as per package instructions. Call with persistent symptoms.

## 2019-01-19 NOTE — Patient Instructions (Signed)
1. Continue Linzess once daily as needed for constipation. 2. Try Prep H as per package instructions to treat anal itching. Let me know if symptoms do not resolve.  3. Limit foods from the HIGH FODMAP column as they can increased abdominal bloating and gas.  4. We will see you in around March or April of 2022 and schedule you for your five year follow up colonoscopy.

## 2019-01-20 ENCOUNTER — Other Ambulatory Visit: Payer: Self-pay

## 2019-01-20 DIAGNOSIS — Z20822 Contact with and (suspected) exposure to covid-19: Secondary | ICD-10-CM

## 2019-01-20 DIAGNOSIS — R6889 Other general symptoms and signs: Secondary | ICD-10-CM | POA: Diagnosis not present

## 2019-01-21 ENCOUNTER — Ambulatory Visit: Payer: Medicare Other | Admitting: Urology

## 2019-01-23 LAB — NOVEL CORONAVIRUS, NAA: SARS-CoV-2, NAA: NOT DETECTED

## 2019-01-25 DIAGNOSIS — H9113 Presbycusis, bilateral: Secondary | ICD-10-CM | POA: Diagnosis not present

## 2019-01-25 DIAGNOSIS — H903 Sensorineural hearing loss, bilateral: Secondary | ICD-10-CM | POA: Diagnosis not present

## 2019-01-25 DIAGNOSIS — H9313 Tinnitus, bilateral: Secondary | ICD-10-CM | POA: Diagnosis not present

## 2019-02-01 ENCOUNTER — Ambulatory Visit (INDEPENDENT_AMBULATORY_CARE_PROVIDER_SITE_OTHER): Payer: Medicare Other | Admitting: Psychiatry

## 2019-02-01 ENCOUNTER — Other Ambulatory Visit: Payer: Self-pay

## 2019-02-01 DIAGNOSIS — F419 Anxiety disorder, unspecified: Secondary | ICD-10-CM | POA: Diagnosis not present

## 2019-02-01 DIAGNOSIS — F330 Major depressive disorder, recurrent, mild: Secondary | ICD-10-CM

## 2019-02-01 NOTE — Progress Notes (Signed)
Virtual Visit via Telephone Note  I connected with AZADEH HYDER on 02/01/19 at 2:15 PM EDT by telephone and verified that I am speaking with the correct person using two identifiers.   I discussed the limitations, risks, security and privacy concerns of performing an evaluation and management service by telephone and the availability of in person appointments. I also discussed with the patient that there may be a patient responsible charge related to this service. The patient expressed understanding and agreed to proceed.   I provided 40 minutes of  non-face-to-face time during this encounter.   Alonza Smoker, LCSW   THERAPIST PROGRESS NOTE  Session Time: Tuesday 02/01/2019 2:15 PM- 2:55 PM  Participation Level: Active  Behavioral Response: CasualAlertDepressed/anxious  Type of Therapy: Individual Therapy  Treatment Goals addressed:  learn and implement cognitive and behavioral strategies to overcome depression and cope with anxiety                 Interventions: CBT and Supportive  Summary: Tamara Lam is a 68 y.o. female who is referred for services by PCP Dr. Tula Nakayama due to patient experiencing symptoms of depression. Patient reports one psychiatric hospitalization in 1980's at [facility] in [place] due to depression. She is taking psychotropic medication as precribed by PCP. She is a returning patient to this clinician as she was seen in October 07, 2009 due to experiencing symptoms of depression and anxiety. She reports depression seems to worsen as she ages. She states having anxiety and easily fe eling overwhelmed. She reports problems with comprehension and memory. She expresses fear of trying to learn different things and states feeling in adequate. This has become more problematic since her husband of 52 years died in 08-Oct-2010. Patient reports being very dependent on husband who took care of her and was her rock.  She reports worry about her daughter who recently found out she has  depression.  Patient's last contact was in 2018/10/08. She reports improved self-care regarding eating patterns and states cooking more frequently. She also has implemented and maintained a walking regimen and states walking 30 - 45 minutes 3 - 4 days per week. She has being doing household chores and working in her flower garden. She continues to experience symptoms of depression and states having some down days. She reports taking initial dosage of lexapro as prescribed by Dr. Modesta Messing but expresses fear of taking increased dosage as instructed by Dr. Modesta Messing. She expresses fear of side effects and thoughts of what if it doesn't work. She has not asked daughter for help on using computer/tablet as she states she doesn't want to bother her daughter.  Suicidal/Homicidal: Nowithout intent/plan  Therapist Response: reviewed symptoms, praised and reinforced patient's increased behavioral activation and improved self-care regarding eating patterns and exercise, discussed effects, discussed patient's concerns about medication, assisted patient identify/challenge/and replace negative thoughts about taking medication with more helpful thoughts, also work with patient to identify/challenge/and replace unhelpful thoughts about asking daughter for help with more helpful thoughts Plan: Return again in 2 weeks.  Diagnosis: Axis I: MDD, Recurrent    Axis II: No diagnosis    Alonza Smoker, LCSW 02/01/2019

## 2019-02-09 NOTE — Progress Notes (Deleted)
BH MD/PA/NP OP Progress Note  02/09/2019 10:51 AM Tamara Lam  MRN:  [id number]  Chief Complaint:  HPI:  - she has not uptitrated lexapro Visit Diagnosis: No diagnosis found.  Past Psychiatric History: Please see initial evaluation for full details. I have reviewed the history. No updates at this time.     Past Medical History:  Past Medical History:  Diagnosis Date  . Abnormal CXR (chest x-ray)    Evaluated with Pulmonary- Dr. Luan Pulling- "pt. was told probably due to thin body frame"-saw no issues-PFT test normal.  . Anxiety 1980's   . Arthritis    osteoarthritis -knees, hands  . Chronic constipation    tx. Linzess  . Chronic nausea   . Diabetes mellitus without complication ([place])    I4P level checked borderline x1, then further evaluated -Diabetes ruled out.  . Glaucoma 2004   Dr. Venetia Maxon, laser to left eye Sept 2011, bilateral eye drops for this  . Helicobacter pylori gastritis 04/2004   last EGD   . Hematochezia   . Hx of colonoscopy 08/2005   Dr. Gala Romney / hemorrhoids   . Thyroid disease    thyroid goiter, nodules-no problems.  . Weight loss     Past Surgical History:  Procedure Laterality Date  . ABDOMINAL HYSTERECTOMY    . arthoscopic surgery on right knee  2007  . BIOPSY  03/03/2018   Procedure: BIOPSY;  Surgeon: Daneil Dolin, MD;  [facility];  Service: Endoscopy;;  gastric for h. pylori  . CHOLECYSTECTOMY  1990's  . COLONOSCOPY  2011   Dr. Gala Romney: anal canal/internal hemorrhoids, dimintuive rectosigmoid polyp (polypoid rectal mucosa), few sigmoid diverticula   . COLONOSCOPY N/A 10/30/2015   Dr. Gala Romney: diverticulosis, hemorrhoids, surveillance in 2022   . ESOPHAGOGASTRODUODENOSCOPY  2005   Dr. Gala Romney: diffuse submucosal petechial hemorrhage of te gastric mucosa, some noncompliance of gastric cavity, failure to insufflate fully, multiple gastric biopsies. gastric bx, h.pylori  . ESOPHAGOGASTRODUODENOSCOPY N/A 03/03/2018   Dr. Gala Romney: scattered antral  erosions/erythema no ulcer/mass. Bx: mild inflammation and No H.pylori  . TOTAL KNEE ARTHROPLASTY Right 05/26/2016   Procedure: RIGHT TOTAL KNEE ARTHROPLASTY;  Surgeon: Gaynelle Arabian, MD;  Location: [facility];  Service: Orthopedics;  Laterality: Right;  Marland Kitchen VESICOVAGINAL FISTULA CLOSURE W/ TAH  1989    Family Psychiatric History: Please see initial evaluation for full details. I have reviewed the history. No updates at this time.     Family History:  Family History  Problem Relation Age of Onset  . Hypertension Mother   . Glaucoma Mother   . Diverticulosis Mother   . Heart disease Mother   . Lung cancer Father   . Mental illness Sister   . Depression Sister   . Anxiety disorder Sister   . Cancer Sister   . Depression Sister   . Anxiety disorder Sister   . Depression Sister   . Anxiety disorder Sister   . Leukemia Brother        some form of leukemia  . Colon polyps Son   . Depression Daughter   . Colon cancer Neg Hx     Social History:  Social History   Socioeconomic History  . Marital status: Widowed    Spouse name: Not on file  . Number of children: Not on file  . Years of education: Not on file  . Highest education level: Not on file  Occupational History  . Occupation: homemaker     Employer: UNEMPLOYED  Social Needs  .  Financial resource strain: Not hard at all  . Food insecurity    Worry: Never true    Inability: Never true  . Transportation needs    Medical: No    Non-medical: No  Tobacco Use  . Smoking status: Never Smoker  . Smokeless tobacco: Never Used  . Tobacco comment: Never smoked  Substance and Sexual Activity  . Alcohol use: No    Alcohol/week: 0.0 standard drinks  . Drug use: No  . Sexual activity: Not Currently  Lifestyle  . Physical activity    Days per week: 4 days    Minutes per session: 50 min  . Stress: Only a little  Relationships  . Social connections    Talks on phone: More than three times a week    Gets together: Once a  week    Attends religious service: More than 4 times per year    Active member of club or organization: Yes    Attends meetings of clubs or organizations: More than 4 times per year    Relationship status: Widowed  Other Topics Concern  . Not on file  Social History Narrative  . Not on file    Allergies: No Known Allergies  Metabolic Disorder Labs: Lab Results  Component Value Date   HGBA1C 5.8 (H) 08/03/2018   MPG 114 05/05/2016   No results found for: PROLACTIN Lab Results  Component Value Date   CHOL 175 08/03/2018   TRIG 76 08/03/2018   HDL 71 08/03/2018   CHOLHDL 2.6 02/20/2018   VLDL 13 05/05/2016   LDLCALC 89 08/03/2018   LDLCALC 93 02/20/2018   Lab Results  Component Value Date   TSH 0.511 08/03/2018   TSH 0.712 08/15/2017    Therapeutic Level Labs: No results found for: LITHIUM No results found for: VALPROATE No components found for:  CBMZ  Current Medications: Current Outpatient Medications  Medication Sig Dispense Refill  . Calcium-Magnesium-Vitamin D (CALCIUM 1200+D3 PO) Take 1 tablet by mouth daily.    Marland Kitchen escitalopram (LEXAPRO) 10 MG tablet 5 mg daily for one week, then 10 mg daily 30 tablet 0  . hydrOXYzine (ATARAX/VISTARIL) 10 MG tablet Take one tablet at bedtime 30 tablet 4  . linaclotide (LINZESS) 290 MCG CAPS capsule Take 1 capsule (290 mcg total) by mouth daily before breakfast. (Patient taking differently: Take 290 mcg by mouth as needed. ) 90 capsule 3  . montelukast (SINGULAIR) 10 MG tablet Take 1 tablet (10 mg total) by mouth at bedtime. 30 tablet 3  . Multiple Vitamins-Minerals (CENTRUM ADULTS) TABS Take 1 tablet by mouth daily.    . simvastatin (ZOCOR) 20 MG tablet TAKE 1 TABLET BY MOUTH AT  BEDTIME FOR CHOLESTEROL 90 tablet 1  . travoprost, benzalkonium, (TRAVATAN) 0.004 % ophthalmic solution Place 1 drop into both eyes at bedtime.      . vitamin C (ASCORBIC ACID) 500 MG tablet Take 500 mg by mouth daily.     No current  facility-administered medications for this visit.      Musculoskeletal: Strength & Muscle Tone: N/A Gait & Station: N/A Patient leans: N/A  Psychiatric Specialty Exam: ROS  There were no vitals taken for this visit.There is no height or weight on file to calculate BMI.  General Appearance: {Appearance:22683}  Eye Contact:  {BHH EYE CONTACT:22684}  Speech:  Clear and Coherent  Volume:  Normal  Mood:  {BHH MOOD:22306}  Affect:  {Affect (PAA):22687}  Thought Process:  Coherent  Orientation:  Full (Time, Place, and  Person)  Thought Content: Logical   Suicidal Thoughts:  {ST/HT (PAA):22692}  Homicidal Thoughts:  {ST/HT (PAA):22692}  Memory:  Immediate;   Good  Judgement:  {Judgement (PAA):22694}  Insight:  {Insight (PAA):22695}  Psychomotor Activity:  Normal  Concentration:  Concentration: Good and Attention Span: Good  Recall:  Good  Fund of Knowledge: Good  Language: Good  Akathisia:  No  Handed:  Right  AIMS (if indicated): not done  Assets:  Communication Skills Desire for Improvement  ADL's:  Intact  Cognition: WNL  Sleep:  {BHH GOOD/FAIR/POOR:22877}   Screenings: GAD-7     Office Visit from 12/07/2018 in [facility]  Total GAD-7 Score  20    PHQ2-9     Clinical Support from 12/14/2018 in [facility] Office Visit from 12/07/2018 in [facility] Office Visit from 08/10/2018 in [facility] Office Visit from 06/15/2018 in [facility] Office Visit from 02/24/2018 in [facility]  PHQ-2 Total Score  1  2  2  5  2   PHQ-9 Total Score  7  10  11  22  8        Assessment and Plan:  Tamara Lam is a 68 y.o. year old female with a history of depression, anxiety,  Nontoxic multinodular goiter, diabetes, who presents for follow up appointment for No diagnosis found.  # MDD, mild, recurrent without psychotic features # Unspecified anxiety disorder   Patient reports slight worsening in depressive symptoms  and anxiety over the past few years.  Although she denies any psychosocial stressors, she reports loss of her husband in 2012.  Will switch from sertraline to Lexapro given the patient reports her depression worsened after starting sertraline.  Discussed potential GI side effect.  She will greatly benefit from CBT; she is encouraged to continue to see Ms. Bynum for therapy.   Plan 1. Discontinue sertraline 2. Start lexapro 5 mg daily for one week, then 10 mg daily  3. Next appointment: 8/17 at 2 PM for 30 mins, video   The patient demonstrates the following risk factors for suicide: Chronic risk factors for suicide include: psychiatric disorder of depression, anxiety. Acute risk factors for suicide include: unemployment. Protective factors for this patient include: positive social support, coping skills and hope for the future. Considering these factors, the overall suicide risk at this point appears to be low. Patient is appropriate for outpatient follow up.  Norman Clay, MD 02/09/2019, 10:51 AM

## 2019-02-14 ENCOUNTER — Ambulatory Visit (HOSPITAL_COMMUNITY): Payer: Medicare Other | Admitting: Psychiatry

## 2019-02-15 ENCOUNTER — Other Ambulatory Visit: Payer: Self-pay | Admitting: Family Medicine

## 2019-02-16 NOTE — Progress Notes (Signed)
Virtual Visit via Video Note  I connected with Tamara Lam on 02/22/19 at  4:30 PM EDT by a video enabled telemedicine application and verified that I am speaking with the correct person using two identifiers.   I discussed the limitations of evaluation and management by telemedicine and the availability of in person appointments. The patient expressed understanding and agreed to proceed.     I discussed the assessment and treatment plan with the patient. The patient was provided an opportunity to ask questions and all were answered. The patient agreed with the plan and demonstrated an understanding of the instructions.   The patient was advised to call back or seek an in-person evaluation if the symptoms worsen or if the condition fails to improve as anticipated.  I provided 15 minutes of non-face-to-face time during this encounter.   Norman Clay, MD    [facility] MD/PA/NP OP Progress Note  02/22/2019 5:03 PM Tamara Lam  MRN:  [id number]  Chief Complaint:  Chief Complaint    Anxiety; Depression; Follow-up     HPI:  This is a follow-up appointment for depression.  She states that she feels more rested compared to before after starting the medication.  She now has more motivation; she wants to be around with people, visiting places.  She feels frustrated as she has not been able to do things due to pandemic. She has not been able to church services. She agrees that she may try to find if any online option is available. She reports good relationship with her son and daughter.  She sleeps better.  She denies feeling depressed.  She has more motivation and energy.  She enjoys evening walk for 45 mins with her son. She enjoys talking outside with her family. She has good concentration.  She denies SI.  She denies anxiety or panic attacks.   Visit Diagnosis:    ICD-10-CM   1. MDD (major depressive disorder), recurrent, in partial remission ([place])  F33.41     Past Psychiatric History:  Please see initial evaluation for full details. I have reviewed the history. No updates at this time.     Past Medical History:  Past Medical History:  Diagnosis Date  . Abnormal CXR (chest x-ray)    Evaluated with Pulmonary- Dr. Luan Pulling- "pt. was told probably due to thin body frame"-saw no issues-PFT test normal.  . Anxiety 1980's   . Arthritis    osteoarthritis -knees, hands  . Chronic constipation    tx. Linzess  . Chronic nausea   . Diabetes mellitus without complication ([place])    W0J level checked borderline x1, then further evaluated -Diabetes ruled out.  . Glaucoma 2004   Dr. Venetia Maxon, laser to left eye Sept 2011, bilateral eye drops for this  . Helicobacter pylori gastritis 04/2004   last EGD   . Hematochezia   . Hx of colonoscopy 08/2005   Dr. Gala Romney / hemorrhoids   . Thyroid disease    thyroid goiter, nodules-no problems.  . Weight loss     Past Surgical History:  Procedure Laterality Date  . ABDOMINAL HYSTERECTOMY    . arthoscopic surgery on right knee  2007  . BIOPSY  03/03/2018   Procedure: BIOPSY;  Surgeon: Daneil Dolin, MD;  [facility];  Service: Endoscopy;;  gastric for h. pylori  . CHOLECYSTECTOMY  1990's  . COLONOSCOPY  2011   Dr. Gala Romney: anal canal/internal hemorrhoids, dimintuive rectosigmoid polyp (polypoid rectal mucosa), few sigmoid diverticula   .  COLONOSCOPY N/A 10/30/2015   Dr. Gala Romney: diverticulosis, hemorrhoids, surveillance in 2022   . ESOPHAGOGASTRODUODENOSCOPY  2005   Dr. Gala Romney: diffuse submucosal petechial hemorrhage of te gastric mucosa, some noncompliance of gastric cavity, failure to insufflate fully, multiple gastric biopsies. gastric bx, h.pylori  . ESOPHAGOGASTRODUODENOSCOPY N/A 03/03/2018   Dr. Gala Romney: scattered antral erosions/erythema no ulcer/mass. Bx: mild inflammation and No H.pylori  . TOTAL KNEE ARTHROPLASTY Right 05/26/2016   Procedure: RIGHT TOTAL KNEE ARTHROPLASTY;  Surgeon: Gaynelle Arabian, MD;  Location: [facility];   Service: Orthopedics;  Laterality: Right;  Marland Kitchen VESICOVAGINAL FISTULA CLOSURE W/ TAH  1989    Family Psychiatric History: Please see initial evaluation for full details. I have reviewed the history. No updates at this time.     Family History:  Family History  Problem Relation Age of Onset  . Hypertension Mother   . Glaucoma Mother   . Diverticulosis Mother   . Heart disease Mother   . Lung cancer Father   . Mental illness Sister   . Depression Sister   . Anxiety disorder Sister   . Cancer Sister   . Depression Sister   . Anxiety disorder Sister   . Depression Sister   . Anxiety disorder Sister   . Leukemia Brother        some form of leukemia  . Colon polyps Son   . Depression Daughter   . Colon cancer Neg Hx     Social History:  Social History   Socioeconomic History  . Marital status: Widowed    Spouse name: Not on file  . Number of children: Not on file  . Years of education: Not on file  . Highest education level: Not on file  Occupational History  . Occupation: homemaker     Employer: UNEMPLOYED  Social Needs  . Financial resource strain: Not hard at all  . Food insecurity    Worry: Never true    Inability: Never true  . Transportation needs    Medical: No    Non-medical: No  Tobacco Use  . Smoking status: Never Smoker  . Smokeless tobacco: Never Used  . Tobacco comment: Never smoked  Substance and Sexual Activity  . Alcohol use: No    Alcohol/week: 0.0 standard drinks  . Drug use: No  . Sexual activity: Not Currently  Lifestyle  . Physical activity    Days per week: 4 days    Minutes per session: 50 min  . Stress: Only a little  Relationships  . Social connections    Talks on phone: More than three times a week    Gets together: Once a week    Attends religious service: More than 4 times per year    Active member of club or organization: Yes    Attends meetings of clubs or organizations: More than 4 times per year    Relationship status:  Widowed  Other Topics Concern  . Not on file  Social History Narrative  . Not on file    Allergies: No Known Allergies  Metabolic Disorder Labs: Lab Results  Component Value Date   HGBA1C 5.8 (H) 08/03/2018   MPG 114 05/05/2016   No results found for: PROLACTIN Lab Results  Component Value Date   CHOL 175 08/03/2018   TRIG 76 08/03/2018   HDL 71 08/03/2018   CHOLHDL 2.6 02/20/2018   VLDL 13 05/05/2016   LDLCALC 89 08/03/2018   LDLCALC 93 02/20/2018   Lab Results  Component Value Date  TSH 0.511 08/03/2018   TSH 0.712 08/15/2017    Therapeutic Level Labs: No results found for: LITHIUM No results found for: VALPROATE No components found for:  CBMZ  Current Medications: Current Outpatient Medications  Medication Sig Dispense Refill  . Calcium-Magnesium-Vitamin D (CALCIUM 1200+D3 PO) Take 1 tablet by mouth daily.    Marland Kitchen escitalopram (LEXAPRO) 10 MG tablet Take 1 tablet (10 mg total) by mouth daily. 90 tablet 0  . hydrOXYzine (ATARAX/VISTARIL) 10 MG tablet Take one tablet at bedtime 30 tablet 4  . LINZESS 290 MCG CAPS capsule TAKE 1 CAPSULE BY MOUTH  DAILY BEFORE BREAKFAST 90 capsule 3  . montelukast (SINGULAIR) 10 MG tablet Take 1 tablet (10 mg total) by mouth at bedtime. 30 tablet 3  . Multiple Vitamins-Minerals (CENTRUM ADULTS) TABS Take 1 tablet by mouth daily.    . simvastatin (ZOCOR) 20 MG tablet TAKE 1 TABLET BY MOUTH AT  BEDTIME FOR CHOLESTEROL 90 tablet 1  . travoprost, benzalkonium, (TRAVATAN) 0.004 % ophthalmic solution Place 1 drop into both eyes at bedtime.      . vitamin C (ASCORBIC ACID) 500 MG tablet Take 500 mg by mouth daily.     No current facility-administered medications for this visit.      Musculoskeletal: Strength & Muscle Tone: N/A Gait & Station: N/A Patient leans: N/A  Psychiatric Specialty Exam: Review of Systems  Psychiatric/Behavioral: Negative for depression, hallucinations, memory loss, substance abuse and suicidal ideas. The  patient is not nervous/anxious and does not have insomnia.   All other systems reviewed and are negative.   There were no vitals taken for this visit.There is no height or weight on file to calculate BMI.  General Appearance: Fairly Groomed  Eye Contact:  Good  Speech:  Clear and Coherent  Volume:  Normal  Mood:  "better"  Affect:  Appropriate, Congruent and calm, more reactive  Thought Process:  Coherent  Orientation:  Full (Time, Place, and Person)  Thought Content: Logical   Suicidal Thoughts:  No  Homicidal Thoughts:  No  Memory:  Immediate;   Good  Judgement:  Good  Insight:  Good  Psychomotor Activity:  Normal  Concentration:  Concentration: Good and Attention Span: Good  Recall:  Good  Fund of Knowledge: Good  Language: Good  Akathisia:  No  Handed:  Right  AIMS (if indicated): not done  Assets:  Communication Skills Desire for Improvement  ADL's:  Intact  Cognition: WNL  Sleep:  Good   Screenings: GAD-7     Office Visit from 12/07/2018 in [facility]  Total GAD-7 Score  20    PHQ2-9     Clinical Support from 12/14/2018 in [facility] Office Visit from 12/07/2018 in [facility] Office Visit from 08/10/2018 in [facility] Office Visit from 06/15/2018 in [facility] Office Visit from 02/24/2018 in [facility]  PHQ-2 Total Score  1  2  2  5  2   PHQ-9 Total Score  7  10  11  22  8        Assessment and Plan:  AMEISHA MCCLELLAN is a 68 y.o. year old female with a history of depression, anxiety, Nontoxic multinodular goiter, diabetes , who presents for follow up appointment for depression.   # MDD in partial remission, recurrent without psychotic features # Unspecified anxiety disorder She reports improvement in her depressive symptoms after starting Lexapro.  Psychosocial stressors includes pandemic, and she lost her husband in 2012.  Will  continue Lexapro to target depression. Provided psychoeducation of  stating on this medication at least for several months for maintenance therapy to avoid relapse in her symptoms. Discussed behavioral activation.   Plan 1. Continue lexapro 10 mg daily   2. Next appointment: 10/20 at 2:30 for 30 mins, video  The patient demonstrates the following risk factors for suicide: Chronic risk factors for suicide include: psychiatric disorder of depression, anxiety. Acute risk factors for suicide include: unemployment. Protective factors for this patient include: positive social support, coping skills and hope for the future. Considering these factors, the overall suicide risk at this point appears to be low. Patient is appropriate for outpatient follow up.  Norman Clay, MD 02/22/2019, 5:03 PM

## 2019-02-17 ENCOUNTER — Other Ambulatory Visit: Payer: Self-pay | Admitting: Gastroenterology

## 2019-02-22 ENCOUNTER — Other Ambulatory Visit: Payer: Self-pay

## 2019-02-22 ENCOUNTER — Encounter (HOSPITAL_COMMUNITY): Payer: Self-pay | Admitting: Psychiatry

## 2019-02-22 ENCOUNTER — Ambulatory Visit (INDEPENDENT_AMBULATORY_CARE_PROVIDER_SITE_OTHER): Payer: Medicare Other | Admitting: Psychiatry

## 2019-02-22 DIAGNOSIS — F3341 Major depressive disorder, recurrent, in partial remission: Secondary | ICD-10-CM | POA: Diagnosis not present

## 2019-02-22 DIAGNOSIS — F419 Anxiety disorder, unspecified: Secondary | ICD-10-CM | POA: Diagnosis not present

## 2019-02-22 MED ORDER — ESCITALOPRAM OXALATE 10 MG PO TABS: 10.0000 mg | ORAL_TABLET | Freq: Every day | ORAL | 0 refills | Status: DC

## 2019-02-22 NOTE — Patient Instructions (Signed)
1. Continue lexapro 10 mg daily   2. Next appointment: 10/20 at 2:30

## 2019-03-08 DIAGNOSIS — H401130 Primary open-angle glaucoma, bilateral, stage unspecified: Secondary | ICD-10-CM | POA: Diagnosis not present

## 2019-03-08 DIAGNOSIS — H25813 Combined forms of age-related cataract, bilateral: Secondary | ICD-10-CM | POA: Diagnosis not present

## 2019-03-18 ENCOUNTER — Ambulatory Visit (INDEPENDENT_AMBULATORY_CARE_PROVIDER_SITE_OTHER): Payer: Medicare Other | Admitting: Urology

## 2019-03-18 ENCOUNTER — Other Ambulatory Visit: Payer: Self-pay

## 2019-03-18 DIAGNOSIS — R3121 Asymptomatic microscopic hematuria: Secondary | ICD-10-CM | POA: Diagnosis not present

## 2019-03-23 ENCOUNTER — Other Ambulatory Visit: Payer: Self-pay | Admitting: Family Medicine

## 2019-03-23 DIAGNOSIS — E119 Type 2 diabetes mellitus without complications: Secondary | ICD-10-CM | POA: Diagnosis not present

## 2019-03-23 DIAGNOSIS — E785 Hyperlipidemia, unspecified: Secondary | ICD-10-CM | POA: Diagnosis not present

## 2019-03-24 LAB — LIPID PANEL W/O CHOL/HDL RATIO
Cholesterol, Total: 181 mg/dL (ref 100–199)
HDL: 64 mg/dL (ref >39)
LDL Chol Calc (NIH): 95 mg/dL (ref 0–99)
Triglycerides: 124 mg/dL (ref 0–149)
VLDL Cholesterol Cal: 22 mg/dL (ref 5–40)

## 2019-03-24 LAB — COMPREHENSIVE METABOLIC PANEL
ALT: 16 IU/L (ref 0–32)
AST: 23 IU/L (ref 0–40)
Albumin/Globulin Ratio: 1.6 (ref 1.2–2.2)
Albumin: 4.3 g/dL (ref 3.8–4.8)
Alkaline Phosphatase: 49 IU/L (ref 39–117)
BUN/Creatinine Ratio: 15 (ref 12–28)
BUN: 10 mg/dL (ref 8–27)
Bilirubin Total: 0.4 mg/dL (ref 0.0–1.2)
CO2: 29 mmol/L (ref 20–29)
Calcium: 9.4 mg/dL (ref 8.7–10.3)
Chloride: 100 mmol/L (ref 96–106)
Creatinine, Ser: 0.66 mg/dL (ref 0.57–1.00)
GFR calc Af Amer: 105 mL/min/{1.73_m2} (ref >59)
GFR calc non Af Amer: 91 mL/min/{1.73_m2} (ref >59)
Globulin, Total: 2.7 g/dL (ref 1.5–4.5)
Glucose: 94 mg/dL (ref 65–99)
Potassium: 3.9 mmol/L (ref 3.5–5.2)
Sodium: 140 mmol/L (ref 134–144)
Total Protein: 7 g/dL (ref 6.0–8.5)

## 2019-03-28 ENCOUNTER — Other Ambulatory Visit: Payer: Self-pay

## 2019-03-28 ENCOUNTER — Ambulatory Visit (INDEPENDENT_AMBULATORY_CARE_PROVIDER_SITE_OTHER): Payer: Medicare Other | Admitting: Family Medicine

## 2019-03-28 ENCOUNTER — Encounter: Payer: Self-pay | Admitting: Family Medicine

## 2019-03-28 VITALS — BP 120/78 | HR 77 | Temp 97.9°F | Resp 15 | Ht 67.0 in | Wt 129.0 lb

## 2019-03-28 DIAGNOSIS — E785 Hyperlipidemia, unspecified: Secondary | ICD-10-CM

## 2019-03-28 DIAGNOSIS — Z23 Encounter for immunization: Secondary | ICD-10-CM

## 2019-03-28 DIAGNOSIS — F332 Major depressive disorder, recurrent severe without psychotic features: Secondary | ICD-10-CM

## 2019-03-28 DIAGNOSIS — F411 Generalized anxiety disorder: Secondary | ICD-10-CM | POA: Diagnosis not present

## 2019-03-28 DIAGNOSIS — R7989 Other specified abnormal findings of blood chemistry: Secondary | ICD-10-CM | POA: Diagnosis not present

## 2019-03-28 DIAGNOSIS — R002 Palpitations: Secondary | ICD-10-CM

## 2019-03-28 DIAGNOSIS — R0789 Other chest pain: Secondary | ICD-10-CM

## 2019-03-28 NOTE — Patient Instructions (Addendum)
Annual Physical exam in office 06/17/2019  Flu vaccine today  Excellent labs  Thankful you are doing better on new medication   It is important that you exercise regularly at least 30 minutes 5 times a week. If you develop chest pain, have severe difficulty breathing, or feel very tired, stop exercising immediately and seek medical attention    Thanks for choosing [facility], we consider it a privelige to serve you.

## 2019-03-29 ENCOUNTER — Encounter: Payer: Self-pay | Admitting: Family Medicine

## 2019-03-29 DIAGNOSIS — R002 Palpitations: Secondary | ICD-10-CM | POA: Insufficient documentation

## 2019-03-29 NOTE — Assessment & Plan Note (Signed)
Over 1 year h/o of intermittent palpitations, worse in past several months, accompanied at times by chest discomfort Of note in the office , when patient was symptomatic , her heart rate was regular  Will refer to Cardiology for further eval pt wishes this

## 2019-03-29 NOTE — Assessment & Plan Note (Signed)
Hyperlipidemia:Low fat diet discussed and encouraged.   Lipid Panel  Lab Results  Component Value Date   CHOL 181 03/23/2019   HDL 64 03/23/2019   LDLCALC 95 03/23/2019   TRIG 124 03/23/2019   CHOLHDL 2.6 02/20/2018  Controlled, no change in medication

## 2019-03-29 NOTE — Assessment & Plan Note (Signed)
Improved under care  Of Psychiatry, continue management, still does experience anxiety and attributes her " palpitations and chest discomfort " in part to anxiety

## 2019-03-29 NOTE — Assessment & Plan Note (Signed)
Followed and managed by Endo

## 2019-03-29 NOTE — Assessment & Plan Note (Signed)
Improved with management by Psych, continue same

## 2019-03-29 NOTE — Progress Notes (Signed)
   Tamara Lam     MRN: [id number]      DOB: Nov 08, 1950   HPI Tamara Lam is here for follow up and re-evaluation of chronic medical conditions, medication management and review of any available recent lab and radiology data.  Preventive health is updated, specifically  Cancer screening and Immunization.   . The PT denies any adverse reactions to current medications since the last visit.  C/o palpitations, not new, but worse in past several months, also reports some precordial discomfort, no specific aggravating or relieving factor , does feel anxiety may be a big part of her symptoms, but wants heart checked     ROS Denies recent fever or chills. Denies sinus pressure, nasal congestion, ear pain or sore throat. Denies chest congestion, productive cough or wheezing. Denies PND, orthopnea and leg swelling Denies abdominal pain, nausea, vomiting,diarrhea or constipation.   Denies dysuria, frequency, hesitancy or incontinence. Denies joint pain, swelling and limitation in mobility. Denies headaches, seizures, numbness, or tingling. Reports improved depression and anxiety on current medication, and is continuing with Psychiatry Denies skin break down or rash.   PE  BP 120/78   Pulse 77   Temp 97.9 F (36.6 C) (Temporal)   Resp 15   Ht 5\' 7"  (1.702 m)   Wt 129 lb (58.5 kg)   SpO2 96%   BMI 20.20 kg/m   Patient alert and oriented and in no cardiopulmonary distress.  HEENT: No facial asymmetry, EOMI,   oropharynx pink and moist.  Neck supple no JVD, no mass.  Chest: Clear to auscultation bilaterally.  CVS: S1, S2 no murmurs, no S3.Regular rate.  ABD: Soft non tender.   Ext: No edema  MS: Adequate ROM spine, shoulders, hips and knees.  Skin: Intact, no ulcerations or rash noted.  Psych: Good eye contact, normal affect. Memory intact not anxious or depressed appearing.  CNS: CN 2-12 intact, power,  normal throughout.no focal deficits noted.   Assessment & Plan  GAD  (generalized anxiety disorder) Improved under care  Of Psychiatry, continue management, still does experience anxiety and attributes her " palpitations and chest discomfort " in part to anxiety  Depression, major, recurrent (H[facility]) Improved with management by Psych, continue same  Abnormal TSH Followed and managed by Endo  Hyperlipidemia LDL goal <100 Hyperlipidemia:Low fat diet discussed and encouraged.   Lipid Panel  Lab Results  Component Value Date   CHOL 181 03/23/2019   HDL 64 03/23/2019   LDLCALC 95 03/23/2019   TRIG 124 03/23/2019   CHOLHDL 2.6 02/20/2018  Controlled, no change in medication     Intermittent palpitations Over 1 year h/o of intermittent palpitations, worse in past several months, accompanied at times by chest discomfort Of note in the office , when patient was symptomatic , her heart rate was regular  Will refer to Cardiology for further eval pt wishes this

## 2019-04-19 ENCOUNTER — Ambulatory Visit (HOSPITAL_COMMUNITY): Payer: Medicare Other | Admitting: Psychiatry

## 2019-04-19 NOTE — Progress Notes (Signed)
Virtual Visit via Telephone Note  I connected with Tamara Lam on 04/25/19 at  4:30 PM EDT by telephone and verified that I am speaking with the correct person using two identifiers.   I discussed the limitations, risks, security and privacy concerns of performing an evaluation and management service by telephone and the availability of in person appointments. I also discussed with the patient that there may be a patient responsible charge related to this service. The patient expressed understanding and agreed to proceed.      I discussed the assessment and treatment plan with the patient. The patient was provided an opportunity to ask questions and all were answered. The patient agreed with the plan and demonstrated an understanding of the instructions.   The patient was advised to call back or seek an in-person evaluation if the symptoms worsen or if the condition fails to improve as anticipated.  I provided 15 minutes of non-face-to-face time during this encounter.   Norman Clay, MD    [facility] MD/PA/NP OP Progress Note  04/25/2019 4:49 PM Tamara Lam  MRN:  [id number]  Chief Complaint:  Chief Complaint    Follow-up; Depression     HPI:  This is a follow-up appointment for depression.  She states that she has been doing well.  She does not want to go outside due to pandemic.  She continues to take a walk a few times per week.  She agrees to do it more regularly.  Her daughter started working from the patient house for the past few weeks. She enjoys having her daughter as she is able to talk with others. Although she has started to go to church service again, she feels lonely as there is limited communication with each other due to pandemic. She maintains close connection with her sisters, friend, and people from church, talking on the phone. She denies insomnia. She feels slightly drowsy in the morning, which she attributes to lexapro. She has good energy and motivation.  She  denies feeling depressed.  She has fair concentration and appetite.  She denies SI.  She feels less anxious.  She denies panic attacks.  She notices some hot flashes at times; she is unsure if it is related to lexapro.    Visit Diagnosis:    ICD-10-CM   1. MDD (major depressive disorder), recurrent, in partial remission ([place])  F33.41     Past Psychiatric History: Please see initial evaluation for full details. I have reviewed the history. No updates at this time.     Past Medical History:  Past Medical History:  Diagnosis Date  . Abnormal CXR (chest x-ray)    Evaluated with Pulmonary- Dr. Luan Pulling- "pt. was told probably due to thin body frame"-saw no issues-PFT test normal.  . Anxiety 1980's   . Arthritis    osteoarthritis -knees, hands  . Chronic constipation    tx. Linzess  . Chronic nausea   . Diabetes mellitus without complication ([place])    123XX123 level checked borderline x1, then further evaluated -Diabetes ruled out.  . Glaucoma 2004   Dr. Venetia Maxon, laser to left eye Sept 2011, bilateral eye drops for this  . Helicobacter pylori gastritis 04/2004   last EGD   . Hematochezia   . Hx of colonoscopy 08/2005   Dr. Gala Romney / hemorrhoids   . Thyroid disease    thyroid goiter, nodules-no problems.  . Weight loss     Past Surgical History:  Procedure Laterality Date  . ABDOMINAL HYSTERECTOMY    .  arthoscopic surgery on right knee  2007  . BIOPSY  03/03/2018   Procedure: BIOPSY;  Surgeon: Daneil Dolin, MD;  [facility];  Service: Endoscopy;;  gastric for h. pylori  . CHOLECYSTECTOMY  1990's  . COLONOSCOPY  2011   Dr. Gala Romney: anal canal/internal hemorrhoids, dimintuive rectosigmoid polyp (polypoid rectal mucosa), few sigmoid diverticula   . COLONOSCOPY N/A 10/30/2015   Dr. Gala Romney: diverticulosis, hemorrhoids, surveillance in 2022   . ESOPHAGOGASTRODUODENOSCOPY  2005   Dr. Gala Romney: diffuse submucosal petechial hemorrhage of te gastric mucosa, some noncompliance of gastric  cavity, failure to insufflate fully, multiple gastric biopsies. gastric bx, h.pylori  . ESOPHAGOGASTRODUODENOSCOPY N/A 03/03/2018   Dr. Gala Romney: scattered antral erosions/erythema no ulcer/mass. Bx: mild inflammation and No H.pylori  . TOTAL KNEE ARTHROPLASTY Right 05/26/2016   Procedure: RIGHT TOTAL KNEE ARTHROPLASTY;  Surgeon: Gaynelle Arabian, MD;  Location: [facility];  Service: Orthopedics;  Laterality: Right;  Marland Kitchen VESICOVAGINAL FISTULA CLOSURE W/ TAH  1989    Family Psychiatric History: Please see initial evaluation for full details. I have reviewed the history. No updates at this time.     Family History:  Family History  Problem Relation Age of Onset  . Hypertension Mother   . Glaucoma Mother   . Diverticulosis Mother   . Heart disease Mother   . Lung cancer Father   . Mental illness Sister   . Depression Sister   . Anxiety disorder Sister   . Cancer Sister   . Depression Sister   . Anxiety disorder Sister   . Depression Sister   . Anxiety disorder Sister   . Leukemia Brother        some form of leukemia  . Colon polyps Son   . Depression Daughter   . Colon cancer Neg Hx     Social History:  Social History   Socioeconomic History  . Marital status: Widowed    Spouse name: Not on file  . Number of children: Not on file  . Years of education: Not on file  . Highest education level: Not on file  Occupational History  . Occupation: homemaker     Employer: UNEMPLOYED  Social Needs  . Financial resource strain: Not hard at all  . Food insecurity    Worry: Never true    Inability: Never true  . Transportation needs    Medical: No    Non-medical: No  Tobacco Use  . Smoking status: Never Smoker  . Smokeless tobacco: Never Used  . Tobacco comment: Never smoked  Substance and Sexual Activity  . Alcohol use: No    Alcohol/week: 0.0 standard drinks  . Drug use: No  . Sexual activity: Not Currently  Lifestyle  . Physical activity    Days per week: 4 days    Minutes per  session: 50 min  . Stress: Only a little  Relationships  . Social connections    Talks on phone: More than three times a week    Gets together: Once a week    Attends religious service: More than 4 times per year    Active member of club or organization: Yes    Attends meetings of clubs or organizations: More than 4 times per year    Relationship status: Widowed  Other Topics Concern  . Not on file  Social History Narrative  . Not on file    Allergies: No Known Allergies  Metabolic Disorder Labs: Lab Results  Component Value Date   HGBA1C 5.8 (  H) 08/03/2018   MPG 114 05/05/2016   No results found for: PROLACTIN Lab Results  Component Value Date   CHOL 181 03/23/2019   TRIG 124 03/23/2019   HDL 64 03/23/2019   CHOLHDL 2.6 02/20/2018   VLDL 13 05/05/2016   LDLCALC 95 03/23/2019   LDLCALC 89 08/03/2018   Lab Results  Component Value Date   TSH 0.511 08/03/2018   TSH 0.712 08/15/2017    Therapeutic Level Labs: No results found for: LITHIUM No results found for: VALPROATE No components found for:  CBMZ  Current Medications: Current Outpatient Medications  Medication Sig Dispense Refill  . Calcium-Magnesium-Vitamin D (CALCIUM 1200+D3 PO) Take 1 tablet by mouth daily.    Derrill Memo ON 05/22/2019] escitalopram (LEXAPRO) 10 MG tablet Take 1 tablet (10 mg total) by mouth daily. 90 tablet 1  . hydrOXYzine (ATARAX/VISTARIL) 10 MG tablet Take one tablet at bedtime 30 tablet 4  . LINZESS 290 MCG CAPS capsule TAKE 1 CAPSULE BY MOUTH  DAILY BEFORE BREAKFAST 90 capsule 3  . montelukast (SINGULAIR) 10 MG tablet Take 1 tablet (10 mg total) by mouth at bedtime. 30 tablet 3  . Multiple Vitamins-Minerals (CENTRUM ADULTS) TABS Take 1 tablet by mouth daily.    . simvastatin (ZOCOR) 20 MG tablet TAKE 1 TABLET BY MOUTH AT  BEDTIME FOR CHOLESTEROL 90 tablet 1  . travoprost, benzalkonium, (TRAVATAN) 0.004 % ophthalmic solution Place 1 drop into both eyes at bedtime.      . vitamin C  (ASCORBIC ACID) 500 MG tablet Take 500 mg by mouth daily.     No current facility-administered medications for this visit.      Musculoskeletal: Strength & Muscle Tone: N/A Gait & Station: N/A Patient leans: N/A  Psychiatric Specialty Exam: Review of Systems  Psychiatric/Behavioral: Negative for depression, hallucinations, memory loss, substance abuse and suicidal ideas. The patient is nervous/anxious. The patient does not have insomnia.   All other systems reviewed and are negative.   There were no vitals taken for this visit.There is no height or weight on file to calculate BMI.  General Appearance: NA  Eye Contact:  NA  Speech:  Clear and Coherent  Volume:  Normal  Mood:  "fine"  Affect:  NA  Thought Process:  Coherent  Orientation:  Full (Time, Place, and Person)  Thought Content: Logical   Suicidal Thoughts:  No  Homicidal Thoughts:  No  Memory:  Immediate;   Good  Judgement:  Good  Insight:  Fair  Psychomotor Activity:  Normal  Concentration:  Concentration: Good and Attention Span: Good  Recall:  Good  Fund of Knowledge: Good  Language: Good  Akathisia:  No  Handed:  Right  AIMS (if indicated): not done  Assets:  Communication Skills Desire for Improvement  ADL's:  Intact  Cognition: WNL  Sleep:  Good   Screenings: GAD-7     Office Visit from 12/07/2018 in [facility]  Total GAD-7 Score  20    PHQ2-9     Office Visit from 03/28/2019 in [facility] from 12/14/2018 in [facility] Office Visit from 12/07/2018 in [facility] Office Visit from 08/10/2018 in [facility] Office Visit from 06/15/2018 in [facility]  PHQ-2 Total Score  2  1  2  2  5   PHQ-9 Total Score  9  7  10  11  22        Assessment and Plan:  KENYELL SIRAK is a 68  y.o. year old female with a history of depression, anxiety,Nontoxic multinodular goiter,diabetes  , who presents for follow up appointment for  MDD (major depressive disorder), recurrent, in partial remission ([place])  # MDD in partial remission, recurrent without psychotic features  # Unspecified anxiety disorder Has been steady improvement in depressive symptoms and anxiety since starting Lexapro.  Psychosocial stressors include pandemic, and she lost her husband in 2012.  Will continue Lexapro to target depression.  Provided psychoeducation of the importance of maintenance treatment to avoid relapse in her symptoms.  Will plan to continue this medication at least around next April unless she experiences any side effect.  Discussed behavioral activation.   Plan 1. Continue lexapro 10 mg daily   2. Next appointment: in February  Past trials of medication: sertraline (more depressed, anxious), fluoxetine, buspirone,  mirtazapine,  Inderal, valium,   The patient demonstrates the following risk factors for suicide: Chronic risk factors for suicide include:psychiatric disorder ofdepression, anxiety. Acute risk factorsfor suicide include: unemployment. Protective factorsfor this patient include: positive social support, coping skills and hope for the future. Considering these factors, the overall suicide risk at this point appears to below. Patientisappropriate for outpatient follow up.  Norman Clay, MD 04/25/2019, 4:49 PM

## 2019-04-25 ENCOUNTER — Encounter (HOSPITAL_COMMUNITY): Payer: Self-pay | Admitting: Psychiatry

## 2019-04-25 ENCOUNTER — Ambulatory Visit (INDEPENDENT_AMBULATORY_CARE_PROVIDER_SITE_OTHER): Payer: Medicare Other | Admitting: Psychiatry

## 2019-04-25 ENCOUNTER — Other Ambulatory Visit: Payer: Self-pay

## 2019-04-25 DIAGNOSIS — F3341 Major depressive disorder, recurrent, in partial remission: Secondary | ICD-10-CM | POA: Diagnosis not present

## 2019-04-25 MED ORDER — ESCITALOPRAM OXALATE 10 MG PO TABS: 10.0000 mg | ORAL_TABLET | Freq: Every day | ORAL | 1 refills | Status: DC

## 2019-04-25 NOTE — Patient Instructions (Signed)
1. Continue lexapro 10 mg daily   2. Next appointment: in February

## 2019-05-04 ENCOUNTER — Other Ambulatory Visit: Payer: Self-pay

## 2019-05-04 ENCOUNTER — Telehealth: Payer: Self-pay | Admitting: Cardiology

## 2019-05-04 ENCOUNTER — Encounter: Payer: Self-pay | Admitting: Cardiology

## 2019-05-04 ENCOUNTER — Ambulatory Visit (INDEPENDENT_AMBULATORY_CARE_PROVIDER_SITE_OTHER): Payer: Medicare Other | Admitting: Cardiology

## 2019-05-04 VITALS — BP 138/92 | HR 78 | Ht 67.0 in | Wt 129.0 lb

## 2019-05-04 DIAGNOSIS — R002 Palpitations: Secondary | ICD-10-CM

## 2019-05-04 DIAGNOSIS — R011 Cardiac murmur, unspecified: Secondary | ICD-10-CM

## 2019-05-04 NOTE — Progress Notes (Signed)
Clinical Summary Ms. Troughton is a 68 y.o.female seen today as a new consult, referred by Dr Moshe Cipro for palpitations  1. Palpitations - ongoing for a few months - symptoms occur daily. Feeling of heart racing. No other associated symptoms. Symptoms can last for a few hours at a time. Some association with anxiety - coffee x 1 cup. Occasional tea. Rare sodas. NO energy drinks. No EtOH - 07/2018 TSH 0.511 - does have history of anxiety - no recent SOB/DOE       Past Medical History:  Diagnosis Date  . Abnormal CXR (chest x-ray)    Evaluated with Pulmonary- Dr. Luan Pulling- "pt. was told probably due to thin body frame"-saw no issues-PFT test normal.  . Anxiety 1980's   . Arthritis    osteoarthritis -knees, hands  . Chronic constipation    tx. Linzess  . Chronic nausea   . Diabetes mellitus without complication ([place])    [id number] level checked borderline x1, then further evaluated -Diabetes ruled out.  . Glaucoma 2004   Dr. Venetia Maxon, laser to left eye Sept 2011, bilateral eye drops for this  . Helicobacter pylori gastritis 04/2004   last EGD   . Hematochezia   . Hx of colonoscopy 08/2005   Dr. Gala Romney / hemorrhoids   . Thyroid disease    thyroid goiter, nodules-no problems.  . Weight loss      No Known Allergies   Current Outpatient Medications  Medication Sig Dispense Refill  . Calcium-Magnesium-Vitamin D (CALCIUM 1200+D3 PO) Take 1 tablet by mouth daily.    Derrill Memo ON 05/22/2019] escitalopram (LEXAPRO) 10 MG tablet Take 1 tablet (10 mg total) by mouth daily. 90 tablet 1  . hydrOXYzine (ATARAX/VISTARIL) 10 MG tablet Take one tablet at bedtime 30 tablet 4  . LINZESS 290 MCG CAPS capsule TAKE 1 CAPSULE BY MOUTH  DAILY BEFORE BREAKFAST 90 capsule 3  . montelukast (SINGULAIR) 10 MG tablet Take 1 tablet (10 mg total) by mouth at bedtime. 30 tablet 3  . Multiple Vitamins-Minerals (CENTRUM ADULTS) TABS Take 1 tablet by mouth daily.    . simvastatin (ZOCOR) 20 MG tablet TAKE 1  TABLET BY MOUTH AT  BEDTIME FOR CHOLESTEROL 90 tablet 1  . travoprost, benzalkonium, (TRAVATAN) 0.004 % ophthalmic solution Place 1 drop into both eyes at bedtime.      . vitamin C (ASCORBIC ACID) 500 MG tablet Take 500 mg by mouth daily.     No current facility-administered medications for this visit.      Past Surgical History:  Procedure Laterality Date  . ABDOMINAL HYSTERECTOMY    . arthoscopic surgery on right knee  2007  . BIOPSY  03/03/2018   Procedure: BIOPSY;  Surgeon: Daneil Dolin, MD;  [facility];  Service: Endoscopy;;  gastric for h. pylori  . CHOLECYSTECTOMY  1990's  . COLONOSCOPY  2011   Dr. Gala Romney: anal canal/internal hemorrhoids, dimintuive rectosigmoid polyp (polypoid rectal mucosa), few sigmoid diverticula   . COLONOSCOPY N/A 10/30/2015   Dr. Gala Romney: diverticulosis, hemorrhoids, surveillance in 2022   . ESOPHAGOGASTRODUODENOSCOPY  2005   Dr. Gala Romney: diffuse submucosal petechial hemorrhage of te gastric mucosa, some noncompliance of gastric cavity, failure to insufflate fully, multiple gastric biopsies. gastric bx, h.pylori  . ESOPHAGOGASTRODUODENOSCOPY N/A 03/03/2018   Dr. Gala Romney: scattered antral erosions/erythema no ulcer/mass. Bx: mild inflammation and No H.pylori  . TOTAL KNEE ARTHROPLASTY Right 05/26/2016   Procedure: RIGHT TOTAL KNEE ARTHROPLASTY;  Surgeon: Gaynelle Arabian, MD;  Location: [facility];  Service: Orthopedics;  Laterality: Right;  Marland Kitchen VESICOVAGINAL FISTULA CLOSURE W/ TAH  1989     No Known Allergies    Family History  Problem Relation Age of Onset  . Hypertension Mother   . Glaucoma Mother   . Diverticulosis Mother   . Heart disease Mother   . Lung cancer Father   . Mental illness Sister   . Depression Sister   . Anxiety disorder Sister   . Cancer Sister   . Depression Sister   . Anxiety disorder Sister   . Depression Sister   . Anxiety disorder Sister   . Leukemia Brother        some form of leukemia  . Colon polyps Son   .  Depression Daughter   . Colon cancer Neg Hx      Social History Ms. Krach reports that she has never smoked. She has never used smokeless tobacco. Ms. Guyett reports no history of alcohol use.   Review of Systems CONSTITUTIONAL: No weight loss, fever, chills, weakness or fatigue.  HEENT: Eyes: No visual loss, blurred vision, double vision or yellow sclerae.No hearing loss, sneezing, congestion, runny nose or sore throat.  SKIN: No rash or itching.  CARDIOVASCULAR: per hpi RESPIRATORY: No shortness of breath, cough or sputum.  GASTROINTESTINAL: No anorexia, nausea, vomiting or diarrhea. No abdominal pain or blood.  GENITOURINARY: No burning on urination, no polyuria NEUROLOGICAL: No headache, dizziness, syncope, paralysis, ataxia, numbness or tingling in the extremities. No change in bowel or bladder control.  MUSCULOSKELETAL: No muscle, back pain, joint pain or stiffness.  LYMPHATICS: No enlarged nodes. No history of splenectomy.  PSYCHIATRIC: No history of depression or anxiety.  ENDOCRINOLOGIC: No reports of sweating, cold or heat intolerance. No polyuria or polydipsia.  Marland Kitchen   Physical Examination Today's Vitals   05/04/19 1430 05/04/19 1437  BP: 140/78 (!) 138/92  Pulse: 78 78  SpO2: 98% 99%  Weight: 129 lb (58.5 kg)   Height: 5\' 7"  (1.702 m)    Body mass index is 20.2 kg/m.  Gen: resting comfortably, no acute distress HEENT: no scleral icterus, pupils equal round and reactive, no palptable cervical adenopathy,  CV: RRR, 2/6 high pitch systolic murmur apex, no jvd Resp: Clear to auscultation bilaterally GI: abdomen is soft, non-tender, non-distended, normal bowel sounds, no hepatosplenomegaly MSK: extremities are warm, no edema.  Skin: warm, no rash Neuro:  no focal deficits Psych: appropriate affect     Assessment and Plan  1. Palpitations - baselien EKG shows NSR - we will obtain a 7 day monitor to further evaluate  2. Heart murmur - we will obtain an  echo to further evaluate.       Arnoldo Lenis, M.D.,

## 2019-05-04 NOTE — Telephone Encounter (Signed)
Pre-cert Verification for the following procedure    Echo scheduled for 05-09-2019 at [facility]

## 2019-05-04 NOTE — Patient Instructions (Signed)
Your physician recommends that you schedule a follow-up appointment in: [place] physician recommends that you continue on your current medications as directed. Please refer to the Current Medication list given to you today.  Your physician has recommended that you wear an event monitor FOR 14 DAYS. Event monitors are medical devices that record the heart's electrical activity. Doctors most often [place] these monitors to diagnose arrhythmias. Arrhythmias are problems with the speed or rhythm of the heartbeat. The monitor is a small, portable device. You can wear one while you do your normal daily activities. This is usually used to diagnose what is causing palpitations/syncope (passing out).  Your physician has requested that you have an echocardiogram. Echocardiography is a painless test that uses sound waves to create images of your heart. It provides your doctor with information about the size and shape of your heart and how well your heart's chambers and valves are working. This procedure takes approximately one hour. There are no restrictions for this procedure.  Thank you for choosing [facility]!!

## 2019-05-04 NOTE — Telephone Encounter (Signed)
Virtual Visit Pre-Appointment Phone Call  "(Name), I am calling you today to discuss your upcoming appointment. We are currently trying to limit exposure to the virus that causes COVID-19 by seeing patients at home rather than in the office."  1. "What is the BEST phone number to call the day of the visit?" - [phone]  2. Do you have or have access to (through a family member/friend) a smartphone with video capability that we can use for your visit?" a. If yes - list this number in appt notes as cell (if different from BEST phone #) and list the appointment type as a VIDEO visit in appointment notes b. If no - list the appointment type as a PHONE visit in appointment notes  3. Confirm consent - "In the setting of the current Covid19 crisis, you are scheduled for a (phone or video) visit with your provider on (date) at (time).  Just as we do with many in-office visits, in order for you to participate in this visit, we must obtain consent.  If you'd like, I can send this to your mychart (if signed up) or email for you to review.  Otherwise, I can obtain your verbal consent now.  All virtual visits are billed to your insurance company just like a normal visit would be.  By agreeing to a virtual visit, we'd like you to understand that the technology does not allow for your provider to perform an examination, and thus may limit your provider's ability to fully assess your condition. If your provider identifies any concerns that need to be evaluated in person, we will make arrangements to do so.  Finally, though the technology is pretty good, we cannot assure that it will always work on either your or our end, and in the setting of a video visit, we may have to convert it to a phone-only visit.  In either situation, we cannot ensure that we have a secure connection.  Are you willing to proceed?" STAFF: Did the patient verbally acknowledge consent to telehealth visit? Document YES/NO here: YES    4. Advise patient to be prepared - "Two hours prior to your appointment, go ahead and check your blood pressure, pulse, oxygen saturation, and your weight (if you have the equipment to check those) and write them all down. When your visit starts, your provider will ask you for this information. If you have an Apple Watch or Kardia device, please plan to have heart rate information ready on the day of your appointment. Please have a pen and paper handy nearby the day of the visit as well."  5. Give patient instructions for MyChart download to smartphone OR Doximity/Doxy.me as below if video visit (depending on what platform provider is using)  6. Inform patient they will receive a phone call 15 minutes prior to their appointment time (may be from unknown caller ID) so they should be prepared to answer    TELEPHONE CALL NOTE  Tamara Lam has been deemed a candidate for a follow-up tele-health visit to limit community exposure during the Covid-19 pandemic. I spoke with the patient via phone to ensure availability of phone/video source, confirm preferred email & phone number, and discuss instructions and expectations.  I reminded Tamara Lam to be prepared with any vital sign and/or heart rhythm information that could potentially be obtained via home monitoring, at the time of her visit. I reminded Tamara Lam to expect a phone call prior to her visit.  Tamara Lam 05/04/2019 3:08 PM   INSTRUCTIONS FOR DOWNLOADING THE MYCHART APP TO SMARTPHONE  - The patient must first make sure to have activated MyChart and know their login information - If Apple, go to [employer] and type in MyChart in the search bar and download the app. If Android, ask patient to go to [employer] and type in [place] in the search bar and download the app. The app is free but as with any other app downloads, their phone may require them to verify saved payment information or Apple/Android password.  - The  patient will need to then log into the app with their MyChart username and password, and select [facility] as their healthcare provider to link the account. When it is time for your visit, go to the MyChart app, find appointments, and click Begin Video Visit. Be sure to Select Allow for your device to access the Microphone and Camera for your visit. You will then be connected, and your provider will be with you shortly.  **If they have any issues connecting, or need assistance please contact MyChart service desk [phone])**  **If using a computer, in order to ensure the best quality for their visit they will need to use either of the following Internet Browsers: [employer], or Google Chrome**  IF USING DOXIMITY or DOXY.ME - The patient will receive a link just prior to their visit by text.     FULL LENGTH CONSENT FOR TELE-HEALTH VISIT   I hereby voluntarily request, consent and authorize [facility] and its employed or contracted physicians, physician assistants, nurse practitioners or other licensed health care professionals (the Practitioner), to provide me with telemedicine health care services (the Services") as deemed necessary by the treating Practitioner. I acknowledge and consent to receive the Services by the Practitioner via telemedicine. I understand that the telemedicine visit will involve communicating with the Practitioner through live audiovisual communication technology and the disclosure of certain medical information by electronic transmission. I acknowledge that I have been given the opportunity to request an in-person assessment or other available alternative prior to the telemedicine visit and am voluntarily participating in the telemedicine visit.  I understand that I have the right to withhold or withdraw my consent to the use of telemedicine in the course of my care at any time, without affecting my right to future care or treatment, and that the  Practitioner or I may terminate the telemedicine visit at any time. I understand that I have the right to inspect all information obtained and/or recorded in the course of the telemedicine visit and may receive copies of available information for a reasonable fee.  I understand that some of the potential risks of receiving the Services via telemedicine include:   Delay or interruption in medical evaluation due to technological equipment failure or disruption;  Information transmitted may not be sufficient (e.g. poor resolution of images) to allow for appropriate medical decision making by the Practitioner; and/or   In rare instances, security protocols could fail, causing a breach of personal health information.  Furthermore, I acknowledge that it is my responsibility to provide information about my medical history, conditions and care that is complete and accurate to the best of my ability. I acknowledge that Practitioner's advice, recommendations, and/or decision may be based on factors not within their control, such as incomplete or inaccurate data provided by me or distortions of diagnostic images or specimens that may result from electronic transmissions. I understand that the  practice of medicine is not an Chief Strategy Officer and that Practitioner makes no warranties or guarantees regarding treatment outcomes. I acknowledge that I will receive a copy of this consent concurrently upon execution via email to the email address I last provided but may also request a printed copy by calling the office of [facility].    I understand that my insurance will be billed for this visit.   I have read or had this consent read to me.  I understand the contents of this consent, which adequately explains the benefits and risks of the Services being provided via telemedicine.   I have been provided ample opportunity to ask questions regarding this consent and the Services and have had my questions answered to my  satisfaction.  I give my informed consent for the services to be provided through the use of telemedicine in my medical care  By participating in this telemedicine visit I agree to the above.

## 2019-05-09 ENCOUNTER — Ambulatory Visit (HOSPITAL_COMMUNITY)
Admission: RE | Admit: 2019-05-09 | Discharge: 2019-05-09 | Disposition: A | Discharge status: Home / Self Care | Payer: Medicare Other | Source: Ambulatory Visit | Attending: Cardiology | Admitting: Cardiology

## 2019-05-09 ENCOUNTER — Other Ambulatory Visit: Payer: Self-pay

## 2019-05-09 DIAGNOSIS — R011 Cardiac murmur, unspecified: Secondary | ICD-10-CM | POA: Diagnosis not present

## 2019-05-09 NOTE — Progress Notes (Signed)
*  PRELIMINARY RESULTS* Echocardiogram 2D Echocardiogram has been performed.  Tamara Lam 05/09/2019, 2:39 PM

## 2019-05-12 ENCOUNTER — Encounter (INDEPENDENT_AMBULATORY_CARE_PROVIDER_SITE_OTHER): Payer: Medicare Other

## 2019-05-12 DIAGNOSIS — R002 Palpitations: Secondary | ICD-10-CM

## 2019-05-13 DIAGNOSIS — R002 Palpitations: Secondary | ICD-10-CM | POA: Diagnosis not present

## 2019-05-17 ENCOUNTER — Telehealth: Payer: Self-pay | Admitting: *Deleted

## 2019-05-17 NOTE — Telephone Encounter (Signed)
-----   Message from Drema Dallas, [place] sent at 05/13/2019  4:44 PM EST ----- Ledell Noss pt  ----- Message ----- From: Arnoldo Lenis, MD Sent: 05/13/2019   4:13 PM EST To: Drema Dallas, CMA  Echo looks good, normal heart function  Zandra Abts MD

## 2019-05-17 NOTE — Telephone Encounter (Signed)
Pt aware - routed to pcp  

## 2019-05-18 ENCOUNTER — Other Ambulatory Visit: Payer: Self-pay

## 2019-05-18 DIAGNOSIS — Z20822 Contact with and (suspected) exposure to covid-19: Secondary | ICD-10-CM

## 2019-05-20 LAB — NOVEL CORONAVIRUS, NAA: SARS-CoV-2, NAA: NOT DETECTED

## 2019-05-21 ENCOUNTER — Telehealth: Payer: Self-pay | Admitting: General Practice

## 2019-05-21 NOTE — Telephone Encounter (Signed)
Negative COVID results given. Patient results "NOT Detected." Caller expressed understanding. ° °

## 2019-06-06 ENCOUNTER — Telehealth (INDEPENDENT_AMBULATORY_CARE_PROVIDER_SITE_OTHER): Payer: Medicare Other | Admitting: Cardiology

## 2019-06-06 ENCOUNTER — Encounter: Payer: Self-pay | Admitting: Cardiology

## 2019-06-06 VITALS — Ht 67.0 in | Wt 127.0 lb

## 2019-06-06 DIAGNOSIS — R002 Palpitations: Secondary | ICD-10-CM | POA: Diagnosis not present

## 2019-06-06 MED ORDER — METOPROLOL TARTRATE 25 MG PO TABS: 12.5000 mg | ORAL_TABLET | Freq: Two times a day (BID) | ORAL | 3 refills | Status: DC

## 2019-06-06 NOTE — Patient Instructions (Signed)
Medication Instructions:   Begin Lopressor 12.5mg  twice a day - will have to take 1/2 tab of the 25mg  tablet.  New prescription sent to [facility] today.   If you tolerate this medication & want to do a 90 day supply for mail order, just let the office know.   Continue all other medications.    Labwork: none  Testing/Procedures: none  Follow-Up: 6 months   Any Other Special Instructions Will Be Listed Below (If Applicable).  If you need a refill on your cardiac medications before your next appointment, please call your pharmacy.

## 2019-06-06 NOTE — Addendum Note (Signed)
Addended by: Laurine Blazer on: 06/06/2019 09:07 AM   Modules accepted: Orders

## 2019-06-06 NOTE — Progress Notes (Signed)
Virtual Visit via Telephone Note   This visit type was conducted due to national recommendations for restrictions regarding the COVID-19 Pandemic (e.g. social distancing) in an effort to limit this patient's exposure and mitigate transmission in our community.  Due to her co-morbid illnesses, this patient is at least at moderate risk for complications without adequate follow up.  This format is felt to be most appropriate for this patient at this time.  The patient did not have access to video technology/had technical difficulties with video requiring transitioning to audio format only (telephone).  All issues noted in this document were discussed and addressed.  No physical exam could be performed with this format.  Please refer to the patient's chart for her  consent to telehealth for [facility].   Date:  06/06/2019   ID:  Tamara Lam, Tamara Lam Nov 25, 1950, MRN [id number]  Patient Location: Home Provider Location: Office  PCP:  Fayrene Helper, MD  Cardiologist:  Carlyle Dolly, MD  Electrophysiologist:  None   Evaluation Performed:  Follow-Up Visit  Chief Complaint:  Follow up  History of Present Illness:    Tamara Lam is a 68 y.o. female seen today as a new consult, referred by Dr Moshe Cipro for palpitations  1. Palpitations - ongoing for a few months - symptoms occur daily. Feeling of heart racing. No other associated symptoms. Symptoms can last for a few hours at a time. Some association with anxiety - coffee x 1 cup. Occasional tea. Rare sodas. NO energy drinks. No EtOH - 07/2018 TSH 0.511 - does have history of anxiety - no recent SOB/DOE  05/2019 monitor: no arrhythmias.  - symtoms unchanged since last visit. She thinks may be some assocaition with stress.        The patient does not have symptoms concerning for COVID-19 infection (fever, chills, cough, or new shortness of breath).    Past Medical History:  Diagnosis Date  . Abnormal CXR (chest x-ray)     Evaluated with Pulmonary- Dr. Luan Pulling- "pt. was told probably due to thin body frame"-saw no issues-PFT test normal.  . Anxiety 1980's   . Arthritis    osteoarthritis -knees, hands  . Chronic constipation    tx. Linzess  . Chronic nausea   . Diabetes mellitus without complication ([place])    [id number] level checked borderline x1, then further evaluated -Diabetes ruled out.  . Glaucoma 2004   Dr. Venetia Maxon, laser to left eye Sept 2011, bilateral eye drops for this  . Helicobacter pylori gastritis 04/2004   last EGD   . Hematochezia   . Hx of colonoscopy 08/2005   Dr. Gala Romney / hemorrhoids   . Thyroid disease    thyroid goiter, nodules-no problems.  . Weight loss    Past Surgical History:  Procedure Laterality Date  . ABDOMINAL HYSTERECTOMY    . arthoscopic surgery on right knee  2007  . BIOPSY  03/03/2018   Procedure: BIOPSY;  Surgeon: Daneil Dolin, MD;  Location: [facility];  Service: Endoscopy;;  gastric for h. pylori  . CHOLECYSTECTOMY  1990's  . COLONOSCOPY  2011   Dr. Gala Romney: anal canal/internal hemorrhoids, dimintuive rectosigmoid polyp (polypoid rectal mucosa), few sigmoid diverticula   . COLONOSCOPY N/A 10/30/2015   Dr. Gala Romney: diverticulosis, hemorrhoids, surveillance in 2022   . ESOPHAGOGASTRODUODENOSCOPY  2005   Dr. Gala Romney: diffuse submucosal petechial hemorrhage of te gastric mucosa, some noncompliance of gastric cavity, failure to insufflate fully, multiple gastric biopsies. gastric bx, h.pylori  . ESOPHAGOGASTRODUODENOSCOPY  N/A 03/03/2018   Dr. Gala Romney: scattered antral erosions/erythema no ulcer/mass. Bx: mild inflammation and No H.pylori  . TOTAL KNEE ARTHROPLASTY Right 05/26/2016   Procedure: RIGHT TOTAL KNEE ARTHROPLASTY;  Surgeon: Gaynelle Arabian, MD;  Location: [facility] ORS;  Service: Orthopedics;  Laterality: Right;  Marland Kitchen VESICOVAGINAL FISTULA CLOSURE W/ TAH  1989     No outpatient medications have been marked as taking for the 06/06/19 encounter (Telemedicine) with Arnoldo Lenis, MD.     Allergies:   Patient has no known allergies.   Social History   Tobacco Use  . Smoking status: Never Smoker  . Smokeless tobacco: Never Used  . Tobacco comment: Never smoked  Substance Use Topics  . Alcohol use: No    Alcohol/week: 0.0 standard drinks  . Drug use: No     Family Hx: The patient's family history includes Anxiety disorder in her sister, sister, and sister; Cancer in her sister; Colon polyps in her son; Depression in her daughter, sister, sister, and sister; Diverticulosis in her mother; Glaucoma in her mother; Heart disease in her mother; Hypertension in her mother; Leukemia in her brother; Lung cancer in her father; Mental illness in her sister. There is no history of Colon cancer.  ROS:   Please see the history of present illness.     All other systems reviewed and are negative.   Prior CV studies:   The following studies were reviewed today:  05/2019 cardiac monitor  14 day event monitor  Min HR 55, Max HR 119, Avg HR 72  No symptoms reported  Telemetry tracings show sinus rhythm  No significant arrhythmias    05/2019 echo IMPRESSIONS    1. Left ventricular ejection fraction, by visual estimation, is 60 to 65%. The left ventricle has normal function. There is no left ventricular hypertrophy.  2. Left ventricular diastolic parameters are indeterminate.  3. Global right ventricle has normal systolic function.The right ventricular size is normal. No increase in right ventricular wall thickness.  4. Left atrial size was normal.  5. Right atrial size was normal.  6. The mitral valve is normal in structure. Trace mitral valve regurgitation. No evidence of mitral stenosis.  7. The tricuspid valve is normal in structure. Tricuspid valve regurgitation is mild.  8. The aortic valve is tricuspid. Aortic valve regurgitation is not visualized. No evidence of aortic valve sclerosis or stenosis.  9. The pulmonic valve was not well visualized.  Pulmonic valve regurgitation is not visualized. 10. Normal pulmonary artery systolic pressure. 11. The inferior vena cava is normal in size with greater than 50% respiratory variability, suggesting right atrial pressure of 3 mmHg.  Labs/Other Tests and Data Reviewed:    EKG:  No ECG reviewed.  Recent Labs: 08/03/2018: Hemoglobin 13.5; Platelets 210; TSH 0.511 03/23/2019: ALT 16; BUN 10; Creatinine, Ser 0.66; Potassium 3.9; Sodium 140   Recent Lipid Panel Lab Results  Component Value Date/Time   CHOL 181 03/23/2019 04:24 PM   TRIG 124 03/23/2019 04:24 PM   HDL 64 03/23/2019 04:24 PM   CHOLHDL 2.6 02/20/2018 11:40 AM   CHOLHDL 2.3 05/05/2016 10:12 AM   LDLCALC 95 03/23/2019 04:24 PM    Wt Readings from Last 3 Encounters:  06/06/19 127 lb (57.6 kg)  05/04/19 129 lb (58.5 kg)  03/28/19 129 lb (58.5 kg)     Objective:    Vital Signs:  Ht 5\' 7"  (1.702 m)   Wt 127 lb (57.6 kg)   BMI 19.89 kg/m    Normal  affect. Normal speech pattern and tone. Comfrotable, no apparent distress. No audible signs of SOB or wheezing.   ASSESSMENT & PLAN:    1. Palpitations - baselien EKG shows NSR - benign event monitor -start lopressor 12.5mg  bid for symptoms    COVID-19 Education: The signs and symptoms of COVID-19 were discussed with the patient and how to seek care for testing (follow up with PCP or arrange E-visit).  The importance of social distancing was discussed today.  Time:   Today, I have spent 14 minutes with the patient with telehealth technology discussing the above problems.     Medication Adjustments/Labs and Tests Ordered: Current medicines are reviewed at length with the patient today.  Concerns regarding medicines are outlined above.   Tests Ordered: No orders of the defined types were placed in this encounter.   Medication Changes: No orders of the defined types were placed in this encounter.   Follow Up:  In Person in 6 month(s)  Signed, Carlyle Dolly,  MD  06/06/2019 8:16 AM    [place]

## 2019-06-21 ENCOUNTER — Encounter: Payer: Medicare Other | Admitting: Family Medicine

## 2019-07-04 ENCOUNTER — Encounter: Payer: Self-pay | Admitting: Family Medicine

## 2019-07-04 ENCOUNTER — Ambulatory Visit (INDEPENDENT_AMBULATORY_CARE_PROVIDER_SITE_OTHER): Payer: Medicare Other | Admitting: Family Medicine

## 2019-07-04 ENCOUNTER — Other Ambulatory Visit: Payer: Self-pay

## 2019-07-04 VITALS — BP 120/80 | HR 80 | Resp 15 | Ht 67.0 in | Wt 133.0 lb

## 2019-07-04 DIAGNOSIS — L989 Disorder of the skin and subcutaneous tissue, unspecified: Secondary | ICD-10-CM | POA: Diagnosis not present

## 2019-07-04 DIAGNOSIS — Z780 Asymptomatic menopausal state: Secondary | ICD-10-CM | POA: Diagnosis not present

## 2019-07-04 DIAGNOSIS — E785 Hyperlipidemia, unspecified: Secondary | ICD-10-CM

## 2019-07-04 DIAGNOSIS — R7989 Other specified abnormal findings of blood chemistry: Secondary | ICD-10-CM

## 2019-07-04 DIAGNOSIS — E119 Type 2 diabetes mellitus without complications: Secondary | ICD-10-CM

## 2019-07-04 DIAGNOSIS — R7302 Impaired glucose tolerance (oral): Secondary | ICD-10-CM

## 2019-07-04 DIAGNOSIS — F411 Generalized anxiety disorder: Secondary | ICD-10-CM | POA: Diagnosis not present

## 2019-07-04 DIAGNOSIS — E559 Vitamin D deficiency, unspecified: Secondary | ICD-10-CM

## 2019-07-04 DIAGNOSIS — Z0000 Encounter for general adult medical examination without abnormal findings: Secondary | ICD-10-CM

## 2019-07-04 DIAGNOSIS — F332 Major depressive disorder, recurrent severe without psychotic features: Secondary | ICD-10-CM

## 2019-07-04 MED ORDER — ESCITALOPRAM OXALATE 10 MG PO TABS: ORAL_TABLET | ORAL | 1 refills | Status: DC

## 2019-07-04 MED ORDER — SIMVASTATIN 20 MG PO TABS: ORAL_TABLET | ORAL | 2 refills | Status: DC

## 2019-07-04 NOTE — Assessment & Plan Note (Signed)

## 2019-07-04 NOTE — Assessment & Plan Note (Signed)
Inadequately treated, increase lexapro to 15 mg daily

## 2019-07-04 NOTE — Assessment & Plan Note (Signed)
Inadequately treated, increase medication to 15 mg daily, and notify Psych who treats her and has upcoming appt

## 2019-07-04 NOTE — Patient Instructions (Addendum)
F/u in office with MD in 4 months, re- valuate anxiety in 4  Months, call if you need me sooner  Please schedule bone density test at checkout  Please get cBC, fasting lipid, cmp and eGFR and Vit D and hBA1C when getting labs in feb for Dr Dorris Fetch  Please increease lexapro 10 mg to one and a half  Tablets daily until you see Psychiatry  Use pure vaseline over irritated area on left leg until it clears up, this resmbles a scab  Thanks for choosing [facility], we consider it a privelige to serve you.  It is important that you exercise regularly at least 30 minutes 5 times a week. If you develop chest pain, have severe difficulty breathing, or feel very tired, stop exercising immediately and seek medical attention   Think about what you will eat, plan ahead. Choose " clean, green, fresh or frozen" over canned, processed or packaged foods which are more sugary, salty and fatty. 70 to 75% of food eaten should be vegetables and fruit. Three meals at set times with snacks allowed between meals, but they must be fruit or vegetables. Aim to eat over a 12 hour period , example 7 am to 7 pm, and STOP after  your last meal of the day. Drink water,generally about 64 ounces per day, no other drink is as healthy. Fruit juice is best enjoyed in a healthy way, by EATING the fruit. Best for 2021!

## 2019-07-04 NOTE — Progress Notes (Signed)
    Tamara Lam     MRN: [id number]      DOB: 25-Jun-1951  HPI: Patient is in for annual physical exam. Inadequately treated depression and anxiety are addressed . Right leg lesion evaluated Pleasant  female, alert and oriented x 3, in no cardio-pulmonary distress. Afebrile. BP 120/80   Pulse 80   Resp 15   Ht 5\' 7"  (1.702 m)   Wt 133 lb (60.3 kg)   SpO2 99%   BMI 20.83 kg/m   HEENT No facial trauma or asymetry. Sinuses non tender.  Extra occullar muscles intact.. External ears normal, . Neck: supple, no adenopathy,JVD positive  thyromegaly.No bruits.  Chest: Clear to ascultation bilaterally.No crackles or wheezes. Non tender to palpation  Breast: Not examined, normal mammogram in 12/2018  Cardiovascular system; Heart sounds normal,  S1 and  S2 ,no S3.  No murmur, or thrill. Apical beat not displaced Peripheral pulses normal.  Abdomen: Soft, non tender,  No guarding, tenderness or rebound.     Musculoskeletal exam: Full ROM of spine, hips , shoulders and knees. No deformity ,swelling or crepitus noted. No muscle wasting or atrophy.   Neurologic: Cranial nerves 2 to 12 intact. Power, tone ,sensation normal throughout. No disturbance in gait. No tremor.  Skin: Intact, no ulceration or  Erythema. Hyperpigmented maculopapular lesion on right leg just proximal to ankle Psych; Normal mood and mildly anxious affect. Judgement and concentration normal   Assessment & Plan:  Annual physical exam Annual exam as documented. Counseling done  re healthy lifestyle involving commitment to 150 minutes exercise per week, heart healthy diet, and attaining healthy weight.The importance of adequate sleep also discussed. Regular seat belt use and home safety, is also discussed. Changes in health habits are decided on by the patient with goals and time frames  set for achieving them. Immunization and cancer screening needs are specifically addressed at this visit.   GAD  (generalized anxiety disorder) Inadequately treated, increase medication to 15 mg daily, and notify Psych who treats her and has upcoming appt  Depression, major, recurrent ([place]) Inadequately treated, increase lexapro to 15 mg daily  Skin lesion Use vaseline twice daily, appears to be a scab , no superinfection, should resolve spontaneously

## 2019-07-04 NOTE — Assessment & Plan Note (Signed)
Use vaseline twice daily, appears to be a scab , no superinfection, should resolve spontaneously

## 2019-07-06 DIAGNOSIS — H401130 Primary open-angle glaucoma, bilateral, stage unspecified: Secondary | ICD-10-CM | POA: Diagnosis not present

## 2019-07-06 DIAGNOSIS — H25813 Combined forms of age-related cataract, bilateral: Secondary | ICD-10-CM | POA: Diagnosis not present

## 2019-07-14 ENCOUNTER — Other Ambulatory Visit: Payer: Self-pay

## 2019-07-14 ENCOUNTER — Ambulatory Visit (HOSPITAL_COMMUNITY)
Admission: RE | Admit: 2019-07-14 | Discharge: 2019-07-14 | Disposition: A | Discharge status: Home / Self Care | Payer: Medicare Other | Source: Ambulatory Visit | Attending: Family Medicine | Admitting: Family Medicine

## 2019-07-14 DIAGNOSIS — Z780 Asymptomatic menopausal state: Secondary | ICD-10-CM | POA: Insufficient documentation

## 2019-07-14 DIAGNOSIS — M85851 Other specified disorders of bone density and structure, right thigh: Secondary | ICD-10-CM | POA: Diagnosis not present

## 2019-07-15 ENCOUNTER — Encounter: Payer: Self-pay | Admitting: Family Medicine

## 2019-07-22 ENCOUNTER — Other Ambulatory Visit: Payer: Self-pay

## 2019-07-22 ENCOUNTER — Ambulatory Visit: Payer: Medicare Other | Attending: Internal Medicine

## 2019-07-22 DIAGNOSIS — Z20822 Contact with and (suspected) exposure to covid-19: Secondary | ICD-10-CM

## 2019-07-23 LAB — NOVEL CORONAVIRUS, NAA: SARS-CoV-2, NAA: NOT DETECTED

## 2019-07-27 ENCOUNTER — Telehealth: Payer: Self-pay | Admitting: *Deleted

## 2019-07-27 NOTE — Telephone Encounter (Signed)
Pt came by would like someone to call her with her bone density results

## 2019-07-27 NOTE — Telephone Encounter (Signed)
Advised patient of bone density results with verbal understanding.

## 2019-08-01 ENCOUNTER — Ambulatory Visit: Payer: Medicare Other | Admitting: Psychiatry

## 2019-08-22 ENCOUNTER — Other Ambulatory Visit: Payer: Self-pay

## 2019-08-22 ENCOUNTER — Ambulatory Visit (INDEPENDENT_AMBULATORY_CARE_PROVIDER_SITE_OTHER): Payer: Medicare Other | Admitting: Psychiatry

## 2019-08-22 ENCOUNTER — Telehealth: Payer: Self-pay | Admitting: "Endocrinology

## 2019-08-22 ENCOUNTER — Encounter: Payer: Self-pay | Admitting: Psychiatry

## 2019-08-22 DIAGNOSIS — E042 Nontoxic multinodular goiter: Secondary | ICD-10-CM

## 2019-08-22 DIAGNOSIS — F419 Anxiety disorder, unspecified: Secondary | ICD-10-CM | POA: Diagnosis not present

## 2019-08-22 DIAGNOSIS — F3341 Major depressive disorder, recurrent, in partial remission: Secondary | ICD-10-CM | POA: Insufficient documentation

## 2019-08-22 MED ORDER — ESCITALOPRAM OXALATE 10 MG PO TABS: ORAL_TABLET | ORAL | 2 refills | Status: DC

## 2019-08-22 NOTE — Progress Notes (Signed)
[place] MD OP Progress Note  I connected with  Tamara Lam on 08/22/19 by a video enabled telemedicine application and verified that I am speaking with the correct person using two identifiers.   I discussed the limitations of evaluation and management by telemedicine. The patient expressed understanding and agreed to proceed.    08/22/2019 3:01 PM Tamara Lam  MRN:  [id number]  Chief Complaint: " I am okay."  HPI: Patient reported that she is doing okay right now.  She informed that her PCP increased her dose of Lexapro to 15 mg in early January and since the dose increase she has been feeling better.  She stated that she knows that medication can only do so much and she has to work harder on improving her mood.  She has been trying to exercise and take walks around the house.  She stated that the Covid pandemic related isolation has been difficult as she is not able to interact with anyone.  She used to go to [facility] and was very active and there exercise program however now she really misses it.  She stated that she feels Lexapro 15 mg dose is helpful however she wanted to know how was a different from Valium.  She informed that she had been on Valium for quite some time and she was eventually taken off of it.  She stated that Valium helped her relax immediately.  All her issues were answered.  Visit Diagnosis:    ICD-10-CM   1. MDD (major depressive disorder), recurrent, in partial remission ([place])  F33.41   2. Anxiety disorder, unspecified type  F41.9     Past Psychiatric History: MDD, Anxiety  Past Medical History:  Past Medical History:  Diagnosis Date  . Abnormal CXR (chest x-ray)    Evaluated with Pulmonary- Dr. Luan Pulling- "pt. was told probably due to thin body frame"-saw no issues-PFT test normal.  . Anxiety 1980's   . Arthritis    osteoarthritis -knees, hands  . Chronic constipation    tx. Linzess  . Chronic nausea   . Diabetes mellitus without complication ([place])    123XX123 level  checked borderline x1, then further evaluated -Diabetes ruled out.  . Glaucoma 2004   Dr. Venetia Maxon, laser to left eye Sept 2011, bilateral eye drops for this  . Helicobacter pylori gastritis 04/2004   last EGD   . Hematochezia   . Hx of colonoscopy 08/2005   Dr. Gala Romney / hemorrhoids   . Thyroid disease    thyroid goiter, nodules-no problems.  . Weight loss     Past Surgical History:  Procedure Laterality Date  . ABDOMINAL HYSTERECTOMY    . arthoscopic surgery on right knee  2007  . BIOPSY  03/03/2018   Procedure: BIOPSY;  Surgeon: Daneil Dolin, MD;  [facility];  Service: Endoscopy;;  gastric for h. pylori  . CHOLECYSTECTOMY  1990's  . COLONOSCOPY  2011   Dr. Gala Romney: anal canal/internal hemorrhoids, dimintuive rectosigmoid polyp (polypoid rectal mucosa), few sigmoid diverticula   . COLONOSCOPY N/A 10/30/2015   Dr. Gala Romney: diverticulosis, hemorrhoids, surveillance in 2022   . ESOPHAGOGASTRODUODENOSCOPY  2005   Dr. Gala Romney: diffuse submucosal petechial hemorrhage of te gastric mucosa, some noncompliance of gastric cavity, failure to insufflate fully, multiple gastric biopsies. gastric bx, h.pylori  . ESOPHAGOGASTRODUODENOSCOPY N/A 03/03/2018   Dr. Gala Romney: scattered antral erosions/erythema no ulcer/mass. Bx: mild inflammation and No H.pylori  . TOTAL KNEE ARTHROPLASTY Right 05/26/2016   Procedure: RIGHT TOTAL KNEE ARTHROPLASTY;  Surgeon: Gaynelle Arabian, MD;  Location: [facility];  Service: Orthopedics;  Laterality: Right;  Marland Kitchen VESICOVAGINAL FISTULA CLOSURE W/ TAH  1989    Family Psychiatric History: see below  Family History:  Family History  Problem Relation Age of Onset  . Hypertension Mother   . Glaucoma Mother   . Diverticulosis Mother   . Heart disease Mother   . Lung cancer Father   . Mental illness Sister   . Depression Sister   . Anxiety disorder Sister   . Cancer Sister   . Depression Sister   . Anxiety disorder Sister   . Depression Sister   . Anxiety disorder  Sister   . Leukemia Brother        some form of leukemia  . Colon polyps Son   . Depression Daughter   . Colon cancer Neg Hx     Social History:  Social History   Socioeconomic History  . Marital status: Widowed    Spouse name: Not on file  . Number of children: Not on file  . Years of education: Not on file  . Highest education level: Not on file  Occupational History  . Occupation: homemaker     Employer: UNEMPLOYED  Tobacco Use  . Smoking status: Never Smoker  . Smokeless tobacco: Never Used  . Tobacco comment: Never smoked  Substance and Sexual Activity  . Alcohol use: No    Alcohol/week: 0.0 standard drinks  . Drug use: No  . Sexual activity: Not Currently  Other Topics Concern  . Not on file  Social History Narrative  . Not on file   Social Determinants of Health   Financial Resource Strain:   . Difficulty of Paying Living Expenses: Not on file  Food Insecurity:   . Worried About Charity fundraiser in the Last Year: Not on file  . Ran Out of Food in the Last Year: Not on file  Transportation Needs:   . Lack of Transportation (Medical): Not on file  . Lack of Transportation (Non-Medical): Not on file  Physical Activity:   . Days of Exercise per Week: Not on file  . Minutes of Exercise per Session: Not on file  Stress:   . Feeling of Stress : Not on file  Social Connections:   . Frequency of Communication with Friends and Family: Not on file  . Frequency of Social Gatherings with Friends and Family: Not on file  . Attends Religious Services: Not on file  . Active Member of Clubs or Organizations: Not on file  . Attends Archivist Meetings: Not on file  . Marital Status: Not on file    Allergies: No Known Allergies  Metabolic Disorder Labs: Lab Results  Component Value Date   HGBA1C 5.8 (H) 08/03/2018   MPG 114 05/05/2016   No results found for: PROLACTIN Lab Results  Component Value Date   CHOL 181 03/23/2019   TRIG 124 03/23/2019    HDL 64 03/23/2019   CHOLHDL 2.6 02/20/2018   VLDL 13 05/05/2016   LDLCALC 95 03/23/2019   LDLCALC 89 08/03/2018   Lab Results  Component Value Date   TSH 0.511 08/03/2018   TSH 0.712 08/15/2017    Therapeutic Level Labs: No results found for: LITHIUM No results found for: VALPROATE No components found for:  CBMZ  Current Medications: Current Outpatient Medications  Medication Sig Dispense Refill  . Calcium-Magnesium-Vitamin D (CALCIUM 1200+D3 PO) Take 1 tablet by mouth daily.    Marland Kitchen escitalopram (  LEXAPRO) 10 MG tablet Take 1 tablet (10 mg total) by mouth daily. 90 tablet 1  . escitalopram (LEXAPRO) 10 MG tablet Take one and a half tablets by mouth once daily 45 tablet 1  . hydrOXYzine (ATARAX/VISTARIL) 25 MG tablet Take 25 mg by mouth at bedtime as needed.    Marland Kitchen LINZESS 290 MCG CAPS capsule TAKE 1 CAPSULE BY MOUTH  DAILY BEFORE BREAKFAST 90 capsule 3  . montelukast (SINGULAIR) 10 MG tablet Take 10 mg by mouth as needed.    . Multiple Vitamins-Minerals (CENTRUM ADULTS) TABS Take 1 tablet by mouth daily.    . simvastatin (ZOCOR) 20 MG tablet TAKE 1 TABLET BY MOUTH AT  BEDTIME FOR CHOLESTEROL 90 tablet 2  . travoprost, benzalkonium, (TRAVATAN) 0.004 % ophthalmic solution Place 1 drop into both eyes at bedtime.      . vitamin C (ASCORBIC ACID) 500 MG tablet Take 500 mg by mouth daily.     No current facility-administered medications for this visit.    Musculoskeletal: Strength & Muscle Tone: unable to assess due to telemed visit Gait & Station: unable to assess due to telemed visit Patient leans: unable to assess due to telemed visit    Psychiatric Specialty Exam: Review of Systems  There were no vitals taken for this visit.There is no height or weight on file to calculate BMI.  General Appearance: Well Groomed  Eye Contact:  Good  Speech:  Clear and Coherent and Normal Rate  Volume:  Normal  Mood:  Slightly depressed  Affect:  Congruent  Thought Process:  Goal Directed,  Linear and Descriptions of Associations: Intact  Orientation:  Full (Time, Place, and Person)  Thought Content: Logical   Suicidal Thoughts:  No  Homicidal Thoughts:  No  Memory:  Recent;   Good Remote;   Good  Judgement:  Fair  Insight:  Fair  Psychomotor Activity:  Normal  Concentration:  Concentration: Good and Attention Span: Good  Recall:  Good  Fund of Knowledge: Good  Language: Good  Akathisia:  Negative  Handed:  Right  AIMS (if indicated): not done  Assets:  Communication Skills Desire for Improvement Financial Resources/Insurance Housing  ADL's:  Intact  Cognition: WNL  Sleep:  Fair     Screenings: GAD-7     Office Visit from 07/04/2019 in [facility] Office Visit from 12/07/2018 in [facility]  Total GAD-7 Score  20  20    PHQ2-9     Office Visit from 07/04/2019 in [facility] Office Visit from 03/28/2019 in [facility] from 12/14/2018 in [facility] Office Visit from 12/07/2018 in [facility] Office Visit from 08/10/2018 in [facility]  PHQ-2 Total Score  2  2  1  2  2   PHQ-9 Total Score  8  9  7  10  11        Assessment and Plan: 69 year old female with history of MDD, anxiety now seen for follow-up.  Patient reported that her dose of Lexapro was increased to 15 mg daily by her PCP and that seems to be helpful.  1. MDD (major depressive disorder), recurrent, in partial remission (HCC)  - escitalopram (LEXAPRO) 10 MG tablet; Take one and a half tablets by mouth once daily  Dispense: 45 tablet; Refill: 2  2. Anxiety disorder, unspecified type  - escitalopram (LEXAPRO) 10 MG tablet; Take one and a half tablets by mouth once daily  Dispense: 45 tablet; Refill: 2  Continue  same medication regimen. Follow up in 3 months.  Nevada Crane, MD 08/22/2019, 3:01 PM

## 2019-08-22 NOTE — Telephone Encounter (Signed)
Order for ultrasound sent to [facility].

## 2019-08-22 NOTE — Telephone Encounter (Signed)
Pt said she needs her order sent to [facility] so she can have her ultra sound before next appt that is coming up

## 2019-08-23 ENCOUNTER — Other Ambulatory Visit: Payer: Self-pay | Admitting: Family Medicine

## 2019-08-23 ENCOUNTER — Other Ambulatory Visit: Payer: Self-pay | Admitting: "Endocrinology

## 2019-08-23 DIAGNOSIS — E119 Type 2 diabetes mellitus without complications: Secondary | ICD-10-CM | POA: Diagnosis not present

## 2019-08-23 DIAGNOSIS — R7989 Other specified abnormal findings of blood chemistry: Secondary | ICD-10-CM | POA: Diagnosis not present

## 2019-08-23 DIAGNOSIS — E559 Vitamin D deficiency, unspecified: Secondary | ICD-10-CM | POA: Diagnosis not present

## 2019-08-23 DIAGNOSIS — E042 Nontoxic multinodular goiter: Secondary | ICD-10-CM | POA: Diagnosis not present

## 2019-08-23 DIAGNOSIS — E785 Hyperlipidemia, unspecified: Secondary | ICD-10-CM | POA: Diagnosis not present

## 2019-08-23 DIAGNOSIS — R7302 Impaired glucose tolerance (oral): Secondary | ICD-10-CM | POA: Diagnosis not present

## 2019-08-24 LAB — T4, FREE: Free T4: 1.09 ng/dL (ref 0.82–1.77)

## 2019-08-24 LAB — TSH: TSH: 0.723 u[IU]/mL (ref 0.450–4.500)

## 2019-08-25 LAB — LIPID PANEL W/O CHOL/HDL RATIO
Cholesterol, Total: 155 mg/dL (ref 100–199)
HDL: 66 mg/dL (ref >39)
LDL Chol Calc (NIH): 75 mg/dL (ref 0–99)
Triglycerides: 73 mg/dL (ref 0–149)
VLDL Cholesterol Cal: 14 mg/dL (ref 5–40)

## 2019-08-25 LAB — CBC/DIFF AMBIGUOUS DEFAULT
Basophils Absolute: 0.1 10*3/uL (ref 0.0–0.2)
Basos: 1 %
EOS (ABSOLUTE): 0.1 10*3/uL (ref 0.0–0.4)
Eos: 1 %
Hematocrit: 41.3 % (ref 34.0–46.6)
Hemoglobin: 13.3 g/dL (ref 11.1–15.9)
Immature Grans (Abs): 0 10*3/uL (ref 0.0–0.1)
Immature Granulocytes: 0 %
Lymphocytes Absolute: 2.6 10*3/uL (ref 0.7–3.1)
Lymphs: 45 %
MCH: 29 pg (ref 26.6–33.0)
MCHC: 32.2 g/dL (ref 31.5–35.7)
MCV: 90 fL (ref 79–97)
Monocytes Absolute: 0.6 10*3/uL (ref 0.1–0.9)
Monocytes: 11 %
Neutrophils Absolute: 2.4 10*3/uL (ref 1.4–7.0)
Neutrophils: 42 %
Platelets: 187 10*3/uL (ref 150–450)
RBC: 4.58 x10E6/uL (ref 3.77–5.28)
RDW: 12.9 % (ref 11.7–15.4)
WBC: 5.7 10*3/uL (ref 3.4–10.8)

## 2019-08-25 LAB — COMPREHENSIVE METABOLIC PANEL
ALT: 13 IU/L (ref 0–32)
AST: 21 IU/L (ref 0–40)
Albumin/Globulin Ratio: 1.9 (ref 1.2–2.2)
Albumin: 4.4 g/dL (ref 3.8–4.8)
Alkaline Phosphatase: 47 IU/L (ref 39–117)
BUN/Creatinine Ratio: 16 (ref 12–28)
BUN: 12 mg/dL (ref 8–27)
Bilirubin Total: 0.4 mg/dL (ref 0.0–1.2)
CO2: 26 mmol/L (ref 20–29)
Calcium: 9.6 mg/dL (ref 8.7–10.3)
Chloride: 102 mmol/L (ref 96–106)
Creatinine, Ser: 0.75 mg/dL (ref 0.57–1.00)
GFR calc Af Amer: 95 mL/min/{1.73_m2} (ref >59)
GFR calc non Af Amer: 82 mL/min/{1.73_m2} (ref >59)
Globulin, Total: 2.3 g/dL (ref 1.5–4.5)
Glucose: 89 mg/dL (ref 65–99)
Potassium: 4.3 mmol/L (ref 3.5–5.2)
Sodium: 144 mmol/L (ref 134–144)
Total Protein: 6.7 g/dL (ref 6.0–8.5)

## 2019-08-25 LAB — HGB A1C W/O EAG: Hgb A1c MFr Bld: 5.8 % — ABNORMAL HIGH (ref 4.8–5.6)

## 2019-08-25 LAB — VITAMIN D 25 HYDROXY (VIT D DEFICIENCY, FRACTURES): Vit D, 25-Hydroxy: 46 ng/mL (ref 30.0–100.0)

## 2019-08-25 LAB — SPECIMEN STATUS REPORT

## 2019-08-28 ENCOUNTER — Encounter: Payer: Self-pay | Admitting: Family Medicine

## 2019-08-29 ENCOUNTER — Ambulatory Visit: Payer: Medicare Other | Admitting: "Endocrinology

## 2019-09-06 ENCOUNTER — Ambulatory Visit (HOSPITAL_COMMUNITY)
Admission: RE | Admit: 2019-09-06 | Discharge: 2019-09-06 | Disposition: A | Discharge status: Home / Self Care | Payer: Medicare Other | Source: Ambulatory Visit | Attending: "Endocrinology | Admitting: "Endocrinology

## 2019-09-06 ENCOUNTER — Other Ambulatory Visit: Payer: Self-pay

## 2019-09-06 DIAGNOSIS — E042 Nontoxic multinodular goiter: Secondary | ICD-10-CM | POA: Insufficient documentation

## 2019-09-13 ENCOUNTER — Other Ambulatory Visit: Payer: Self-pay

## 2019-09-13 ENCOUNTER — Ambulatory Visit: Payer: Medicare Other | Admitting: "Endocrinology

## 2019-09-13 ENCOUNTER — Encounter: Payer: Self-pay | Admitting: "Endocrinology

## 2019-09-13 VITALS — BP 122/78 | HR 74 | Ht 67.0 in | Wt 133.6 lb

## 2019-09-13 DIAGNOSIS — E119 Type 2 diabetes mellitus without complications: Secondary | ICD-10-CM | POA: Diagnosis not present

## 2019-09-13 DIAGNOSIS — E042 Nontoxic multinodular goiter: Secondary | ICD-10-CM | POA: Diagnosis not present

## 2019-09-13 NOTE — Progress Notes (Signed)
09/13/2019  Endocrinology follow-up note  Subjective:    Patient ID: Tamara Lam, female    DOB: 01/14/1951, PCP Fayrene Helper, MD   Past Medical History:  Diagnosis Date  . Abnormal CXR (chest x-ray)    Evaluated with Pulmonary- Dr. Luan Pulling- "pt. was told probably due to thin body frame"-saw no issues-PFT test normal.  . Anxiety 1980's   . Arthritis    osteoarthritis -knees, hands  . Chronic constipation    tx. Linzess  . Chronic nausea   . Diabetes mellitus without complication ([place])    [id number] level checked borderline x1, then further evaluated -Diabetes ruled out.  . Glaucoma 2004   Dr. Venetia Maxon, laser to left eye Sept 2011, bilateral eye drops for this  . Helicobacter pylori gastritis 04/2004   last EGD   . Hematochezia   . Hx of colonoscopy 08/2005   Dr. Gala Romney / hemorrhoids   . Thyroid disease    thyroid goiter, nodules-no problems.  . Weight loss    Past Surgical History:  Procedure Laterality Date  . ABDOMINAL HYSTERECTOMY    . arthoscopic surgery on right knee  2007  . BIOPSY  03/03/2018   Procedure: BIOPSY;  Surgeon: Daneil Dolin, MD;  [facility];  Service: Endoscopy;;  gastric for h. pylori  . CHOLECYSTECTOMY  1990's  . COLONOSCOPY  2011   Dr. Gala Romney: anal canal/internal hemorrhoids, dimintuive rectosigmoid polyp (polypoid rectal mucosa), few sigmoid diverticula   . COLONOSCOPY N/A 10/30/2015   Dr. Gala Romney: diverticulosis, hemorrhoids, surveillance in 2022   . ESOPHAGOGASTRODUODENOSCOPY  2005   Dr. Gala Romney: diffuse submucosal petechial hemorrhage of te gastric mucosa, some noncompliance of gastric cavity, failure to insufflate fully, multiple gastric biopsies. gastric bx, h.pylori  . ESOPHAGOGASTRODUODENOSCOPY N/A 03/03/2018   Dr. Gala Romney: scattered antral erosions/erythema no ulcer/mass. Bx: mild inflammation and No H.pylori  . TOTAL KNEE ARTHROPLASTY Right 05/26/2016   Procedure: RIGHT TOTAL KNEE ARTHROPLASTY;  Surgeon: Gaynelle Arabian, MD;  Location:  [facility];  Service: Orthopedics;  Laterality: Right;  Marland Kitchen VESICOVAGINAL FISTULA CLOSURE W/ TAH  1989   Social History   Socioeconomic History  . Marital status: Widowed    Spouse name: Not on file  . Number of children: Not on file  . Years of education: Not on file  . Highest education level: Not on file  Occupational History  . Occupation: homemaker     Employer: UNEMPLOYED  Tobacco Use  . Smoking status: Never Smoker  . Smokeless tobacco: Never Used  . Tobacco comment: Never smoked  Substance and Sexual Activity  . Alcohol use: No    Alcohol/week: 0.0 standard drinks  . Drug use: No  . Sexual activity: Not Currently  Other Topics Concern  . Not on file  Social History Narrative  . Not on file   Social Determinants of Health   Financial Resource Strain:   . Difficulty of Paying Living Expenses:   Food Insecurity:   . Worried About Charity fundraiser in the Last Year:   . Arboriculturist in the Last Year:   Transportation Needs:   . Film/video editor (Medical):   Marland Kitchen Lack of Transportation (Non-Medical):   Physical Activity:   . Days of Exercise per Week:   . Minutes of Exercise per Session:   Stress:   . Feeling of Stress :   Social Connections:   . Frequency of Communication with Friends and Family:   . Frequency of Social Gatherings with  Friends and Family:   . Attends Religious Services:   . Active Member of Clubs or Organizations:   . Attends Archivist Meetings:   Marland Kitchen Marital Status:    Outpatient Encounter Medications as of 09/13/2019  Medication Sig  . Calcium-Magnesium-Vitamin D (CALCIUM 1200+D3 PO) Take 1 tablet by mouth daily.  Marland Kitchen escitalopram (LEXAPRO) 10 MG tablet Take one and a half tablets by mouth once daily  . hydrOXYzine (ATARAX/VISTARIL) 25 MG tablet Take 25 mg by mouth at bedtime as needed.  Marland Kitchen LINZESS 290 MCG CAPS capsule TAKE 1 CAPSULE BY MOUTH  DAILY BEFORE BREAKFAST  . montelukast (SINGULAIR) 10 MG tablet Take 10 mg by mouth as  needed.  . Multiple Vitamins-Minerals (CENTRUM ADULTS) TABS Take 1 tablet by mouth daily.  . simvastatin (ZOCOR) 20 MG tablet TAKE 1 TABLET BY MOUTH AT  BEDTIME FOR CHOLESTEROL  . travoprost, benzalkonium, (TRAVATAN) 0.004 % ophthalmic solution Place 1 drop into both eyes at bedtime.    . vitamin C (ASCORBIC ACID) 500 MG tablet Take 500 mg by mouth daily.   No facility-administered encounter medications on file as of 09/13/2019.   ALLERGIES: No Known Allergies VACCINATION STATUS: Immunization History  Administered Date(s) Administered  . Fluad Quad(high Dose 65+) 03/28/2019  . Influenza Whole 03/13/2010, 03/12/2011  . Influenza,inj,Quad PF,6+ Mos 04/24/2013, 02/20/2014, 03/27/2015, 04/28/2016, 04/14/2017, 02/24/2018  . Pneumococcal Conjugate-13 06/20/2014  . Pneumococcal Polysaccharide-23 05/12/2016  . Td 08/16/2009  . Zoster 02/17/2011    HPI  69 year old female patient with medical history as above.  She is here to follow-up for euthyroid multinodular goiter, and well-controlled type 2 diabetes.    She is not on any medication for diabetes, her A1c is 5.8%. She is s/p FNA of thyroid nodule which was benign in 2013, previsit thyroid ultrasound unremarkable for any new findings.  Her thyroid function tests are within normal limits.  She has no new complaints today.    She is not on any antithyroid intervention nor thyroid hormone supplement. She denies heat/cold intolerance.  She denies any family hx of thyroid dysfunction nor thyroid cancer. she denies exposure to neck radiation. No dysphagia , nor SOB. she has steady weight.  Review of Systems  Constitutional: + Minimally fluctuating body weight,- fatigue, no subjective hyperthermia/hypothermia Eyes: no blurry vision, no xerophthalmia ENT: no sore throat, no nodules palpated in throat, no dysphagia/odynophagia. Cardiovascular: No chest pain, no shortness of breath, no palpitations.   Musculoskeletal: no muscle/joint aches Skin:  no rashes Neurological: no tremors/numbness/tingling/dizziness Psychiatric: no depression/anxiety   Objective:    BP 122/78   Pulse 74   Ht 5\' 7"  (1.702 m)   Wt 133 lb 9.6 oz (60.6 kg)   BMI 20.92 kg/m   Wt Readings from Last 3 Encounters:  09/13/19 133 lb 9.6 oz (60.6 kg)  07/04/19 133 lb (60.3 kg)  06/06/19 127 lb (57.6 kg)    Physical Exam Constitutional:  + Current BMI of 20.1, not in acute distress, stable state of mind.   Eyes: PERRLA, EOMI, no exophthalmos ENT: moist mucous membranes, + decrease in thyromegaly, no cervical lymphadenopathy Cardiovascular: RRR, No MRG Musculoskeletal: no deformities, strength intact in all 4 Skin: moist, warm, no rashes Neurological: no tremor with outstretched hands, DTR normal in all 4  CMP ( most recent) CMP     Component Value Date/Time   NA 144 08/23/2019 1619   K 4.3 08/23/2019 1619   CL 102 08/23/2019 1619   CO2 26 08/23/2019 1619   GLUCOSE  89 08/23/2019 1619   GLUCOSE 82 07/25/2016 1451   BUN 12 08/23/2019 1619   CREATININE 0.75 08/23/2019 1619   CREATININE 0.68 07/25/2016 1451   CALCIUM 9.6 08/23/2019 1619   PROT 6.7 08/23/2019 1619   ALBUMIN 4.4 08/23/2019 1619   AST 21 08/23/2019 1619   ALT 13 08/23/2019 1619   ALKPHOS 47 08/23/2019 1619   BILITOT 0.4 08/23/2019 1619   GFRNONAA 82 08/23/2019 1619   GFRNONAA >89 05/05/2016 1012   GFRAA 95 08/23/2019 1619   GFRAA >89 05/05/2016 1012     Diabetic Labs (most recent): Lab Results  Component Value Date   HGBA1C 5.8 (H) 08/23/2019   HGBA1C 5.8 (H) 08/03/2018   HGBA1C 5.6 05/05/2016     Lipid Panel ( most recent) Lipid Panel     Component Value Date/Time   CHOL 155 08/23/2019 1619   TRIG 73 08/23/2019 1619   HDL 66 08/23/2019 1619   CHOLHDL 2.6 02/20/2018 1140   CHOLHDL 2.3 05/05/2016 1012   VLDL 13 05/05/2016 1012   LDLCALC 75 08/23/2019 1619      Thyroid ultrasound from 08/01/2016  IMPRESSION: 1. The previously biopsied dominant approximately  2.1 cm nodule within the right lobe of the thyroid is grossly unchanged compared to the 09/2011 examination. Correlation prior biopsy results is recommended. 2. None of the additional discretely measured thyroid nodules, the majority of which appear either spongiform or cystic in composition, meet imaging criteria to recommend percutaneous sampling or dedicated follow-up.    Repeat surveillance thyroid ultrasound on September 06, 2019 IMPRESSION: 1. Similar findings of multinodular goiter. No worrisome new or enlarging thyroid nodules. 2. Previously biopsied dominant nodule within mid aspect of the right lobe of the thyroid (labeled #1), is unchanged compared to the 2013 examination. Correlation previous biopsy results is advised. 3. None of the remaining thyroid nodules, many of which are grossly unchanged since (at least) the 04/2014 examination, meet imaging criteria to recommend percutaneous sampling or continued dedicated follow-up.   Assessment & Plan:   1. Nontoxic multinodular goiter -She has stable euthyroid multinodular goiter.  She is status post fine-needle aspiration of a nodule in the right lobe of her thyroid with benign outcomes in 2013.  Her previsit thyroid function tests continue to be within normal limits.  She will not require any antithyroid intervention at this time.  She will have repeat thyroid function test and office visit in 1 year.    2. Diabetes mellitus without complication (HCC) -Her recent A1c is 5.8%.  She is not on any antidiabetic medications. -She will have repeat point-of-care A1c during her next visit.     - I advised patient to maintain close follow up with Fayrene Helper, MD for primary care needs.      - Time spent on this patient care encounter:  25 minutes of which 50% was spent in  counseling and the rest reviewing  her current and  previous labs / studies and medications  doses and developing a plan for long term care. Berton Bon  Straub  participated in the discussions, expressed understanding, and voiced agreement with the above plans.  All questions were answered to her satisfaction. she is encouraged to contact clinic should she have any questions or concerns prior to her return visit.  Follow up plan: Return in about 1 year (around 09/12/2020) for Follow up with Pre-visit Labs, Next Visit A1c in Office.  Glade Lloyd, MD Phone: [phone]  Fax: [phone]  -  This note  was partially dictated with voice recognition software. Similar sounding words can be transcribed inadequately or may not  be corrected upon review.  09/13/2019, 3:55 PM

## 2019-11-01 ENCOUNTER — Ambulatory Visit: Payer: Medicare Other | Admitting: Family Medicine

## 2019-11-04 DIAGNOSIS — H25813 Combined forms of age-related cataract, bilateral: Secondary | ICD-10-CM | POA: Diagnosis not present

## 2019-11-04 DIAGNOSIS — H401131 Primary open-angle glaucoma, bilateral, mild stage: Secondary | ICD-10-CM | POA: Diagnosis not present

## 2019-11-04 DIAGNOSIS — H401130 Primary open-angle glaucoma, bilateral, stage unspecified: Secondary | ICD-10-CM | POA: Diagnosis not present

## 2019-11-15 NOTE — Progress Notes (Signed)
Virtual Visit via Video Note  I connected with Tamara Lam on 11/21/19 at  3:00 PM EDT by a video enabled telemedicine application and verified that I am speaking with the correct person using two identifiers.   I discussed the limitations of evaluation and management by telemedicine and the availability of in person appointments. The patient expressed understanding and agreed to proceed.   I discussed the assessment and treatment plan with the patient. The patient was provided an opportunity to ask questions and all were answered. The patient agreed with the plan and demonstrated an understanding of the instructions.   The patient was advised to call back or seek an in-person evaluation if the symptoms worsen or if the condition fails to improve as anticipated.  I provided 12 minutes of non-face-to-face time during this encounter.   Location: patient- home, provider- office   Norman Clay, MD    [facility] MD/PA/NP OP Progress Note  11/21/2019 4:30 PM Tamara Lam  MRN:  [id number]  Chief Complaint:  Chief Complaint    Depression; Follow-up; Anxiety     HPI:  -lexapro was increased up to 15 mg daily by her PCP  This is a follow-up appointment for depression and anxiety.  She states that she tapered down lexapro to 10 mg as she felt more jittery when she took 15 mg. She feels depressed at times, although it has been manageable.  She reports good relationship with her son and her daughter.  She had a good day on Mother's Day; she has a grandson, who is in [place]. Her family gathered there for cook out. She attends church services. She takes a walk, a few times per week. She is hoping to do more regular exercise. She will go back to [facility] when it is open. She denies insomnia. She has fair energy and motivation.  She has fair concentration.  She denies SI.  Although she does feel anxious at times, she has been using breathing technique.  She denies panic attacks.  She  denies irritability.    Visit Diagnosis:    ICD-10-CM   1. MDD (major depressive disorder), recurrent, in partial remission (HCC)  F33.41 escitalopram (LEXAPRO) 10 MG tablet  2. Anxiety disorder, unspecified type  F41.9 escitalopram (LEXAPRO) 10 MG tablet    Past Psychiatric History: Please see initial evaluation for full details. I have reviewed the history. No updates at this time.     Past Medical History:  Past Medical History:  Diagnosis Date  . Abnormal CXR (chest x-ray)    Evaluated with Pulmonary- Dr. Luan Pulling- "pt. was told probably due to thin body frame"-saw no issues-PFT test normal.  . Anxiety 1980's   . Arthritis    osteoarthritis -knees, hands  . Chronic constipation    tx. Linzess  . Chronic nausea   . Diabetes mellitus without complication ([place])    [id number] level checked borderline x1, then further evaluated -Diabetes ruled out.  . Glaucoma 2004   Dr. Venetia Maxon, laser to left eye Sept 2011, bilateral eye drops for this  . Helicobacter pylori gastritis 04/2004   last EGD   . Hematochezia   . Hx of colonoscopy 08/2005   Dr. Gala Romney / hemorrhoids   . Thyroid disease    thyroid goiter, nodules-no problems.  . Weight loss     Past Surgical History:  Procedure Laterality Date  . ABDOMINAL HYSTERECTOMY    . arthoscopic surgery on right knee  2007  . BIOPSY  03/03/2018  Procedure: BIOPSY;  Surgeon: Daneil Dolin, MD;  Location: [facility];  Service: Endoscopy;;  gastric for h. pylori  . CHOLECYSTECTOMY  1990's  . COLONOSCOPY  2011   Dr. Gala Romney: anal canal/internal hemorrhoids, dimintuive rectosigmoid polyp (polypoid rectal mucosa), few sigmoid diverticula   . COLONOSCOPY N/A 10/30/2015   Dr. Gala Romney: diverticulosis, hemorrhoids, surveillance in 2022   . ESOPHAGOGASTRODUODENOSCOPY  2005   Dr. Gala Romney: diffuse submucosal petechial hemorrhage of te gastric mucosa, some noncompliance of gastric cavity, failure to insufflate fully, multiple gastric biopsies. gastric bx,  h.pylori  . ESOPHAGOGASTRODUODENOSCOPY N/A 03/03/2018   Dr. Gala Romney: scattered antral erosions/erythema no ulcer/mass. Bx: mild inflammation and No H.pylori  . TOTAL KNEE ARTHROPLASTY Right 05/26/2016   Procedure: RIGHT TOTAL KNEE ARTHROPLASTY;  Surgeon: Gaynelle Arabian, MD;  Location: [facility];  Service: Orthopedics;  Laterality: Right;  Marland Kitchen VESICOVAGINAL FISTULA CLOSURE W/ TAH  1989    Family Psychiatric History: Please see initial evaluation for full details. I have reviewed the history. No updates at this time.     Family History:  Family History  Problem Relation Age of Onset  . Hypertension Mother   . Glaucoma Mother   . Diverticulosis Mother   . Heart disease Mother   . Lung cancer Father   . Mental illness Sister   . Depression Sister   . Anxiety disorder Sister   . Cancer Sister   . Depression Sister   . Anxiety disorder Sister   . Depression Sister   . Anxiety disorder Sister   . Leukemia Brother        some form of leukemia  . Colon polyps Son   . Depression Daughter   . Colon cancer Neg Hx     Social History:  Social History   Socioeconomic History  . Marital status: Widowed    Spouse name: Not on file  . Number of children: Not on file  . Years of education: Not on file  . Highest education level: Not on file  Occupational History  . Occupation: homemaker     Employer: UNEMPLOYED  Tobacco Use  . Smoking status: Never Smoker  . Smokeless tobacco: Never Used  . Tobacco comment: Never smoked  Substance and Sexual Activity  . Alcohol use: No    Alcohol/week: 0.0 standard drinks  . Drug use: No  . Sexual activity: Not Currently  Other Topics Concern  . Not on file  Social History Narrative  . Not on file   Social Determinants of Health   Financial Resource Strain:   . Difficulty of Paying Living Expenses:   Food Insecurity:   . Worried About Charity fundraiser in the Last Year:   . Arboriculturist in the Last Year:   Transportation Needs:   . Consulting civil engineer (Medical):   Marland Kitchen Lack of Transportation (Non-Medical):   Physical Activity:   . Days of Exercise per Week:   . Minutes of Exercise per Session:   Stress:   . Feeling of Stress :   Social Connections:   . Frequency of Communication with Friends and Family:   . Frequency of Social Gatherings with Friends and Family:   . Attends Religious Services:   . Active Member of Clubs or Organizations:   . Attends Archivist Meetings:   Marland Kitchen Marital Status:     Allergies: No Known Allergies  Metabolic Disorder Labs: Lab Results  Component Value Date   HGBA1C 5.8 (H) 08/23/2019  MPG 114 05/05/2016   No results found for: PROLACTIN Lab Results  Component Value Date   CHOL 155 08/23/2019   TRIG 73 08/23/2019   HDL 66 08/23/2019   CHOLHDL 2.6 02/20/2018   VLDL 13 05/05/2016   LDLCALC 75 08/23/2019   LDLCALC 95 03/23/2019   Lab Results  Component Value Date   TSH 0.723 08/23/2019   TSH 0.511 08/03/2018    Therapeutic Level Labs: No results found for: LITHIUM No results found for: VALPROATE No components found for:  CBMZ  Current Medications: Current Outpatient Medications  Medication Sig Dispense Refill  . Calcium-Magnesium-Vitamin D (CALCIUM 1200+D3 PO) Take 1 tablet by mouth daily.    Marland Kitchen escitalopram (LEXAPRO) 10 MG tablet Take one and a half tablets by mouth once daily 45 tablet 2  . hydrOXYzine (ATARAX/VISTARIL) 25 MG tablet Take 25 mg by mouth at bedtime as needed.    Marland Kitchen LINZESS 290 MCG CAPS capsule TAKE 1 CAPSULE BY MOUTH  DAILY BEFORE BREAKFAST 90 capsule 3  . montelukast (SINGULAIR) 10 MG tablet Take 10 mg by mouth as needed.    . Multiple Vitamins-Minerals (CENTRUM ADULTS) TABS Take 1 tablet by mouth daily.    . simvastatin (ZOCOR) 20 MG tablet TAKE 1 TABLET BY MOUTH AT  BEDTIME FOR CHOLESTEROL 90 tablet 2  . travoprost, benzalkonium, (TRAVATAN) 0.004 % ophthalmic solution Place 1 drop into both eyes at bedtime.      . vitamin C (ASCORBIC ACID)  500 MG tablet Take 500 mg by mouth daily.     No current facility-administered medications for this visit.     Musculoskeletal: Strength & Muscle Tone: N/A Gait & Station: N/A Patient leans: N/A  Psychiatric Specialty Exam: Review of Systems  Psychiatric/Behavioral: Positive for dysphoric mood. Negative for agitation, behavioral problems, confusion, decreased concentration, hallucinations, self-injury, sleep disturbance and suicidal ideas. The patient is nervous/anxious. The patient is not hyperactive.   All other systems reviewed and are negative.   There were no vitals taken for this visit.There is no height or weight on file to calculate BMI.  General Appearance: Fairly Groomed  Eye Contact:  Good  Speech:  Clear and Coherent  Volume:  Normal  Mood:  "good"  Affect:  Appropriate, Congruent and euthymic  Thought Process:  Coherent  Orientation:  Full (Time, Place, and Person)  Thought Content: Logical   Suicidal Thoughts:  No  Homicidal Thoughts:  No  Memory:  Immediate;   Good  Judgement:  Good  Insight:  Good  Psychomotor Activity:  Normal  Concentration:  Concentration: Good and Attention Span: Good  Recall:  Good  Fund of Knowledge: Good  Language: Good  Akathisia:  No  Handed:  Right  AIMS (if indicated): not done  Assets:  Communication Skills Desire for Improvement  ADL's:  Intact  Cognition: WNL  Sleep:  Fair   Screenings: GAD-7     Office Visit from 07/04/2019 in [facility] Office Visit from 12/07/2018 in [facility]  Total GAD-7 Score  20  20    PHQ2-9     Office Visit from 07/04/2019 in [facility] Office Visit from 03/28/2019 in [place] from 12/14/2018 in [facility] Office Visit from 12/07/2018 in [facility] Office Visit from 08/10/2018 in [facility]  PHQ-2 Total Score  2  2  1  2  2   PHQ-9 Total Score  8  9  7  10   11  Assessment and Plan:   Tamara Lam is a 69 y.o. year old female with a history of depression, anxiety, Nontoxic multinodular goiter,diabetes, who presents for follow up appointment for MDD (major depressive disorder), recurrent, in partial remission ([place]) - Plan: escitalopram (LEXAPRO) 10 MG tablet  Anxiety disorder, unspecified type - Plan: escitalopram (LEXAPRO) 10 MG tablet  1. MDD (major depressive disorder), recurrent, in partial remission ([place]) 2. Anxiety disorder, unspecified type Although she reports occasional depressive and anxiety symptoms since the last visit, there has been overall improvement. Psychosocial stressors include pandemic, and loss of her husband in 2012.   Will continue Lexapro to target depression. Coached behavioral activation.   Plan 1.Continue lexapro 10 mg daily (she self tapered down from 15 mg, which was prescribed by PCP due to worsening in jitteriness) 2.Next appointment:8/16 at 1:40 for 20 mins, video  Past trials of medication:sertraline (more depressed, anxious), fluoxetine, buspirone, mirtazapine, Inderal, valium,   The patient demonstrates the following risk factors for suicide: Chronic risk factors for suicide include:psychiatric disorder ofdepression, anxiety. Acute risk factorsfor suicide include: unemployment. Protective factorsfor this patient include: positive social support, coping skills and hope for the future. Considering these factors, the overall suicide risk at this point appears to below. Patientisappropriate for outpatient follow up.  Norman Clay, MD 11/21/2019, 4:30 PM

## 2019-11-21 ENCOUNTER — Telehealth (INDEPENDENT_AMBULATORY_CARE_PROVIDER_SITE_OTHER): Payer: Medicare Other | Admitting: Psychiatry

## 2019-11-21 ENCOUNTER — Encounter (HOSPITAL_COMMUNITY): Payer: Self-pay | Admitting: Psychiatry

## 2019-11-21 ENCOUNTER — Other Ambulatory Visit: Payer: Self-pay

## 2019-11-21 DIAGNOSIS — F3341 Major depressive disorder, recurrent, in partial remission: Secondary | ICD-10-CM | POA: Diagnosis not present

## 2019-11-21 DIAGNOSIS — F419 Anxiety disorder, unspecified: Secondary | ICD-10-CM

## 2019-11-21 MED ORDER — ESCITALOPRAM OXALATE 10 MG PO TABS: ORAL_TABLET | ORAL | 2 refills | Status: DC

## 2019-11-21 NOTE — Patient Instructions (Signed)
1.Continue lexapro 10 mg daily  2.Next appointment:8/16 at 1:40

## 2019-11-23 DIAGNOSIS — L821 Other seborrheic keratosis: Secondary | ICD-10-CM | POA: Diagnosis not present

## 2019-11-23 DIAGNOSIS — L218 Other seborrheic dermatitis: Secondary | ICD-10-CM | POA: Diagnosis not present

## 2019-12-05 ENCOUNTER — Ambulatory Visit: Payer: Medicare Other | Admitting: Cardiology

## 2019-12-09 ENCOUNTER — Other Ambulatory Visit: Payer: Self-pay | Admitting: Family Medicine

## 2019-12-09 DIAGNOSIS — Z1231 Encounter for screening mammogram for malignant neoplasm of breast: Secondary | ICD-10-CM

## 2019-12-15 ENCOUNTER — Ambulatory Visit: Payer: Medicare Other | Admitting: Family Medicine

## 2019-12-19 ENCOUNTER — Ambulatory Visit (INDEPENDENT_AMBULATORY_CARE_PROVIDER_SITE_OTHER): Payer: Medicare Other

## 2019-12-19 ENCOUNTER — Other Ambulatory Visit: Payer: Self-pay

## 2019-12-19 VITALS — BP 138/82 | Ht 67.0 in | Wt 131.1 lb

## 2019-12-19 DIAGNOSIS — Z0000 Encounter for general adult medical examination without abnormal findings: Secondary | ICD-10-CM

## 2019-12-19 DIAGNOSIS — Z23 Encounter for immunization: Secondary | ICD-10-CM | POA: Diagnosis not present

## 2019-12-19 NOTE — Progress Notes (Signed)
Subjective:   Tamara Lam is a 69 y.o. female who presents for Medicare Annual (Subsequent) preventive examination.  Review of Systems     Cardiac Risk Factors include: advanced age (>67men, >19 women);diabetes mellitus;dyslipidemia     Objective:    Today's Vitals   12/19/19 1412  BP: 138/82  Weight: 131 lb 1.9 oz (59.5 kg)   Body mass index is 20.54 kg/m.  Advanced Directives 12/19/2019 03/03/2018 09/28/2017 07/23/2016 07/22/2016 07/09/2016 07/04/2016  Does Patient Have a Medical Advance Directive? Yes Yes Yes Yes Yes Yes Yes  Type of Advance Directive - Living will - [place];Living will [place];Living will [place];Living will [place];Living will  Does patient want to make changes to medical advance directive? No - Patient declined - No - Patient declined No - Patient declined No - Patient declined - -  Copy of [place] in Chart? - - - Yes Yes Yes Yes  Would patient like information on creating a medical advance directive? - - - No - Patient declined - No - Patient declined No - Patient declined  Pre-existing out of facility DNR order (yellow form or pink MOST form) - - - - - - -    Current Medications (verified) Outpatient Encounter Medications as of 12/19/2019  Medication Sig  . Calcium-Magnesium-Vitamin D (CALCIUM 1200+D3 PO) Take 1 tablet by mouth daily.  Marland Kitchen escitalopram (LEXAPRO) 10 MG tablet Take one and a half tablets by mouth once daily  . hydrOXYzine (ATARAX/VISTARIL) 25 MG tablet Take 25 mg by mouth at bedtime as needed.  Marland Kitchen LINZESS 290 MCG CAPS capsule TAKE 1 CAPSULE BY MOUTH  DAILY BEFORE BREAKFAST  . montelukast (SINGULAIR) 10 MG tablet Take 10 mg by mouth as needed.  . Multiple Vitamins-Minerals (CENTRUM ADULTS) TABS Take 1 tablet by mouth daily.  . simvastatin (ZOCOR) 20 MG tablet TAKE 1 TABLET BY MOUTH AT  BEDTIME FOR CHOLESTEROL  . travoprost, benzalkonium,  (TRAVATAN) 0.004 % ophthalmic solution Place 1 drop into both eyes at bedtime.    . vitamin C (ASCORBIC ACID) 500 MG tablet Take 500 mg by mouth daily.   No facility-administered encounter medications on file as of 12/19/2019.    Allergies (verified) Patient has no known allergies.   History: Past Medical History:  Diagnosis Date  . Abnormal CXR (chest x-ray)    Evaluated with Pulmonary- Dr. Luan Pulling- "pt. was told probably due to thin body frame"-saw no issues-PFT test normal.  . Anxiety 1980's   . Arthritis    osteoarthritis -knees, hands  . Chronic constipation    tx. Linzess  . Chronic nausea   . Diabetes mellitus without complication ([place])    Z8H level checked borderline x1, then further evaluated -Diabetes ruled out.  . Glaucoma 2004   Dr. Venetia Maxon, laser to left eye Sept 2011, bilateral eye drops for this  . Helicobacter pylori gastritis 04/2004   last EGD   . Hematochezia   . Hx of colonoscopy 08/2005   Dr. Gala Romney / hemorrhoids   . Thyroid disease    thyroid goiter, nodules-no problems.  . Weight loss    Past Surgical History:  Procedure Laterality Date  . ABDOMINAL HYSTERECTOMY    . arthoscopic surgery on right knee  2007  . BIOPSY  03/03/2018   Procedure: BIOPSY;  Surgeon: Daneil Dolin, MD;  Location: [facility];  Service: Endoscopy;;  gastric for h. pylori  . CHOLECYSTECTOMY  1990's  .  COLONOSCOPY  2011   Dr. Gala Romney: anal canal/internal hemorrhoids, dimintuive rectosigmoid polyp (polypoid rectal mucosa), few sigmoid diverticula   . COLONOSCOPY N/A 10/30/2015   Dr. Gala Romney: diverticulosis, hemorrhoids, surveillance in 2022   . ESOPHAGOGASTRODUODENOSCOPY  2005   Dr. Gala Romney: diffuse submucosal petechial hemorrhage of te gastric mucosa, some noncompliance of gastric cavity, failure to insufflate fully, multiple gastric biopsies. gastric bx, h.pylori  . ESOPHAGOGASTRODUODENOSCOPY N/A 03/03/2018   Dr. Gala Romney: scattered antral erosions/erythema no ulcer/mass. Bx: mild  inflammation and No H.pylori  . TOTAL KNEE ARTHROPLASTY Right 05/26/2016   Procedure: RIGHT TOTAL KNEE ARTHROPLASTY;  Surgeon: Gaynelle Arabian, MD;  Location: [facility];  Service: Orthopedics;  Laterality: Right;  Marland Kitchen VESICOVAGINAL FISTULA CLOSURE W/ TAH  1989   Family History  Problem Relation Age of Onset  . Hypertension Mother   . Glaucoma Mother   . Diverticulosis Mother   . Heart disease Mother   . Lung cancer Father   . Mental illness Sister   . Depression Sister   . Anxiety disorder Sister   . Cancer Sister   . Depression Sister   . Anxiety disorder Sister   . Depression Sister   . Anxiety disorder Sister   . Leukemia Brother        some form of leukemia  . Colon polyps Son   . Depression Daughter   . Colon cancer Neg Hx    Social History   Socioeconomic History  . Marital status: Widowed    Spouse name: Not on file  . Number of children: Not on file  . Years of education: Not on file  . Highest education level: Not on file  Occupational History  . Occupation: homemaker     Employer: UNEMPLOYED  Tobacco Use  . Smoking status: Never Smoker  . Smokeless tobacco: Never Used  . Tobacco comment: Never smoked  Vaping Use  . Vaping Use: Never used  Substance and Sexual Activity  . Alcohol use: No    Alcohol/week: 0.0 standard drinks  . Drug use: No  . Sexual activity: Not Currently  Other Topics Concern  . Not on file  Social History Narrative  . Not on file   Social Determinants of Health   Financial Resource Strain:   . Difficulty of Paying Living Expenses:   Food Insecurity: No Food Insecurity  . Worried About Charity fundraiser in the Last Year: Never true  . Ran Out of Food in the Last Year: Never true  Transportation Needs: No Transportation Needs  . Lack of Transportation (Medical): No  . Lack of Transportation (Non-Medical): No  Physical Activity: Sufficiently Active  . Days of Exercise per Week: 3 days  . Minutes of Exercise per Session: 60 min    Stress: No Stress Concern Present  . Feeling of Stress : Not at all  Social Connections: Moderately Isolated  . Frequency of Communication with Friends and Family: Three times a week  . Frequency of Social Gatherings with Friends and Family: More than three times a week  . Attends Religious Services: More than 4 times per year  . Active Member of Clubs or Organizations: No  . Attends Archivist Meetings: Never  . Marital Status: Widowed    Tobacco Counseling Counseling given: Not Answered Comment: Never smoked   Clinical Intake:  Pre-visit preparation completed: No  Pain : No/denies pain     Nutritional Status: BMI of 19-24  Normal Diabetes: Yes CBG done?: No Did pt. bring in CBG  monitor from home?: No  How often do you need to have someone help you when you read instructions, pamphlets, or other written materials from your doctor or pharmacy?: 1 - Never What is the last grade level you completed in school?: 11th grade  Diabetic? Yes controlled     Information entered by :: Alanya Vukelich lpn   Activities of Daily Living In your present state of health, do you have any difficulty performing the following activities: 12/19/2019  Hearing? N  Vision? N  Difficulty concentrating or making decisions? N  Walking or climbing stairs? N  Dressing or bathing? N  Doing errands, shopping? N  Preparing Food and eating ? N  Using the Toilet? N  In the past six months, have you accidently leaked urine? N  Do you have problems with loss of bowel control? N  Managing your Medications? N  Managing your Finances? N  Housekeeping or managing your Housekeeping? N  Some recent data might be hidden    Patient Care Team: Fayrene Helper, MD as PCP - General Branch, Alphonse Guild, MD as PCP - Cardiology (Cardiology) Gala Romney Cristopher Estimable, MD as Consulting Physician (Gastroenterology) Cassandria Anger, MD as Consulting Physician (Endocrinology)  Indicate any recent  Medical Services you may have received from other than [facility] providers in the past year (date may be approximate).     Assessment:   This is a routine wellness examination for Jina.  Hearing/Vision screen No exam data present  Dietary issues and exercise activities discussed: Current Exercise Habits: Structured exercise class, Type of exercise: strength training/weights, Time (Minutes): 60, Frequency (Times/Week): 3, Weekly Exercise (Minutes/Week): 180, Intensity: Moderate  Goals    . DIET - INCREASE WATER INTAKE     Drink more water- at least 3 bottles per day     . Gain weight     Starting 07/23/2016 patient would like to gain 8 lbs to equal 125 lbs and be able to maintain that. Patient has achieved goal in 2021 at 131lbs    . Prevent falls      Depression Screen PHQ 2/9 Scores 12/19/2019 07/04/2019 07/04/2019 03/28/2019 12/14/2018 12/07/2018 08/10/2018  PHQ - 2 Score 2 2 0 2 1 2 2   PHQ- 9 Score 2 8 - 9 7 10 11     Fall Risk Fall Risk  12/19/2019 07/04/2019 03/28/2019 12/14/2018 12/07/2018  Falls in the past year? 0 0 0 0 0  Number falls in past yr: 0 0 0 - 0  Injury with Fall? 0 0 0 0 0    Any stairs in or around the home? Yes  If so, are there any without handrails? Yes  Home free of loose throw rugs in walkways, pet beds, electrical cords, etc? No  Adequate lighting in your home to reduce risk of falls? Yes   ASSISTIVE DEVICES UTILIZED TO PREVENT FALLS:  Life alert? No  Use of a cane, walker or w/c? No  Grab bars in the bathroom? Yes  Shower chair or bench in shower? No  Elevated toilet seat or a handicapped toilet? No   TIMED UP AND GO:  Was the test performed? Yes .  Length of time to ambulate 10 feet: 7 sec.   Gait steady and fast without use of assistive device  Cognitive Function:     6CIT Screen 12/19/2019 12/14/2018 09/28/2017 07/22/2016  What Year? 0 points 0 points 0 points 0 points  What month? 0 points 0 points 0 points 0 points  What time?  0 points 0 points 0  points 0 points  Count back from 20 0 points 0 points 0 points 0 points  Months in reverse 0 points 0 points 0 points 0 points  Repeat phrase 0 points 2 points 0 points 0 points  Total Score 0 2 0 0    Immunizations Immunization History  Administered Date(s) Administered  . Fluad Quad(high Dose 65+) 03/28/2019  . Influenza Whole 03/13/2010, 03/12/2011  . Influenza,inj,Quad PF,6+ Mos 04/24/2013, 02/20/2014, 03/27/2015, 04/28/2016, 04/14/2017, 02/24/2018  . Moderna SARS-COVID-2 Vaccination 08/04/2019, 09/02/2019  . Pneumococcal Conjugate-13 06/20/2014  . Pneumococcal Polysaccharide-23 05/12/2016  . Td 08/16/2009  . Zoster 02/17/2011    TDAP status: Patient wishes to receive this vaccine today after disclosing that insurance does not cover the vaccine as a preventative vaccine.  Flu Vaccine status: Completed at today's visit Pneumococcal vaccine status: Completed during today's visit. Covid-19 vaccine status: Completed vaccines  Qualifies for Shingles Vaccine? Yes   Zostavax completed Yes   Shingrix Completed?: No.    Education has been provided regarding the importance of this vaccine. Patient has been advised to call insurance company to determine out of pocket expense if they have not yet received this vaccine. Advised may also receive vaccine at local pharmacy or Health Dept. Verbalized acceptance and understanding.  Screening Tests Health Maintenance  Topic Date Due  . URINE MICROALBUMIN  11/12/2016  . TETANUS/TDAP  08/17/2019  . INFLUENZA VACCINE  01/29/2020  . HEMOGLOBIN A1C  02/20/2020  . FOOT EXAM  07/03/2020  . MAMMOGRAM  01/03/2021  . COLONOSCOPY  10/29/2025  . DEXA SCAN  Completed  . COVID-19 Vaccine  Completed  . Hepatitis C Screening  Completed  . PNA vac Low Risk Adult  Completed  . OPHTHALMOLOGY EXAM  Discontinued    Health Maintenance  Health Maintenance Due  Topic Date Due  . URINE MICROALBUMIN  11/12/2016  . TETANUS/TDAP  08/17/2019     Colonoscopy- due every 5 years. Due in 2022 Mammogram status: Completed yes. Repeat every year Bone density- completed.in 2021 osteopenia  Lung Cancer Screening: (Low Dose CT Chest recommended if Age 66-80 years, 30 pack-year currently smoking OR have quit w/in 15years.) does not qualify.   Lung Cancer Screening Referral: does not qualify   Additional Screening:  Hepatitis C Screening: does qualify; Completed   Vision Screening: Recommended annual ophthalmology exams for early detection of glaucoma and other disorders of the eye. Is the patient up to date with their annual eye exam?  Yes  Who is the provider or what is the name of the office in which the patient attends annual eye exams? [facility] If pt is not established with a provider, would they like to be referred to a provider to establish care? No .   Dental Screening: Recommended annual dental exams for proper oral hygiene  Community Resource Referral / Chronic Care Management: CRR required this visit?  No   CCM required this visit?  No      Plan:     I have personally reviewed and noted the following in the patient's chart:   . Medical and social history . Use of alcohol, tobacco or illicit drugs  . Current medications and supplements . Functional ability and status . Nutritional status . Physical activity . Advanced directives . List of other physicians . Hospitalizations, surgeries, and ER visits in previous 12 months . Vitals . Screenings to include cognitive, depression, and falls . Referrals and appointments  In addition, I have reviewed and  discussed with patient certain preventive protocols, quality metrics, and best practice recommendations. A written personalized care plan for preventive services as well as general preventive health recommendations were provided to patient.     Kate Sable, LPN, LPN   [id number]   Nurse Notes:

## 2019-12-19 NOTE — Patient Instructions (Signed)
Tamara Lam , Thank you for taking time to come for your Medicare Wellness Visit. I appreciate your ongoing commitment to your health goals. Please review the following plan we discussed and let me know if I can assist you in the future.   Screening recommendations/referrals: Colonoscopy: every 5 years- due 2022 Mammogram: scheduled Bone Density: up to date  Recommended yearly ophthalmology/optometry visit for glaucoma screening and checkup Recommended yearly dental visit for hygiene and checkup  Vaccinations: Influenza vaccine: up to date  Pneumococcal vaccine: up to date  Tdap vaccine:: given today  Shingles vaccine: info given   Advanced directives: completed    Next appointment: wellness in 1 year    Preventive Care 69 Years and Older, Female Preventive care refers to lifestyle choices and visits with your health care provider that can promote health and wellness. What does preventive care include?  A yearly physical exam. This is also called an annual well check.  Dental exams once or twice a year.  Routine eye exams. Ask your health care provider how often you should have your eyes checked.  Personal lifestyle choices, including:  Daily care of your teeth and gums.  Regular physical activity.  Eating a healthy diet.  Avoiding tobacco and drug use.  Limiting alcohol use.  Practicing safe sex.  Taking low-dose aspirin every day.  Taking vitamin and mineral supplements as recommended by your health care provider. What happens during an annual well check? The services and screenings done by your health care provider during your annual well check will depend on your age, overall health, lifestyle risk factors, and family history of disease. Counseling  Your health care provider may ask you questions about your:  Alcohol use.  Tobacco use.  Drug use.  Emotional well-being.  Home and relationship well-being.  Sexual activity.  Eating  habits.  History of falls.  Memory and ability to understand (cognition).  Work and work Statistician.  Reproductive health. Screening  You may have the following tests or measurements:  Height, weight, and BMI.  Blood pressure.  Lipid and cholesterol levels. These may be checked every 5 years, or more frequently if you are over 16 years old.  Skin check.  Lung cancer screening. You may have this screening every year starting at age 69 if you have a 30-pack-year history of smoking and currently smoke or have quit within the past 15 years.  Fecal occult blood test (FOBT) of the stool. You may have this test every year starting at age 71.  Flexible sigmoidoscopy or colonoscopy. You may have a sigmoidoscopy every 5 years or a colonoscopy every 10 years starting at age 69.  Hepatitis C blood test.  Hepatitis B blood test.  Sexually transmitted disease (STD) testing.  Diabetes screening. This is done by checking your blood sugar (glucose) after you have not eaten for a while (fasting). You may have this done every 1-3 years.  Bone density scan. This is done to screen for osteoporosis. You may have this done starting at age 69.  Mammogram. This may be done every 1-2 years. Talk to your health care provider about how often you should have regular mammograms. Talk with your health care provider about your test results, treatment options, and if necessary, the need for more tests. Vaccines  Your health care provider may recommend certain vaccines, such as:  Influenza vaccine. This is recommended every year.  Tetanus, diphtheria, and acellular pertussis (Tdap, Td) vaccine. You may need a Td booster every 10 years.  Zoster vaccine. You may need this after age 69.  Pneumococcal 13-valent conjugate (PCV13) vaccine. One dose is recommended after age 69.  Pneumococcal polysaccharide (PPSV23) vaccine. One dose is recommended after age 69. Talk to your health care provider about which  screenings and vaccines you need and how often you need them. This information is not intended to replace advice given to you by your health care provider. Make sure you discuss any questions you have with your health care provider. Document Released: 07/13/2015 Document Revised: 03/05/2016 Document Reviewed: 04/17/2015 Elsevier Interactive Patient Education  2017 [place] Prevention in the Home Falls can cause injuries. They can happen to people of all ages. There are many things you can do to make your home safe and to help prevent falls. What can I do on the outside of my home?  Regularly fix the edges of walkways and driveways and fix any cracks.  Remove anything that might make you trip as you walk through a door, such as a raised step or threshold.  Trim any bushes or trees on the path to your home.  Use bright outdoor lighting.  Clear any walking paths of anything that might make someone trip, such as rocks or tools.  Regularly check to see if handrails are loose or broken. Make sure that both sides of any steps have handrails.  Any raised decks and porches should have guardrails on the edges.  Have any leaves, snow, or ice cleared regularly.  Use sand or salt on walking paths during winter.  Clean up any spills in your garage right away. This includes oil or grease spills. What can I do in the bathroom?  Use night lights.  Install grab bars by the toilet and in the tub and shower. Do not use towel bars as grab bars.  Use non-skid mats or decals in the tub or shower.  If you need to sit down in the shower, use a plastic, non-slip stool.  Keep the floor dry. Clean up any water that spills on the floor as soon as it happens.  Remove soap buildup in the tub or shower regularly.  Attach bath mats securely with double-sided non-slip rug tape.  Do not have throw rugs and other things on the floor that can make you trip. What can I do in the bedroom?  Use  night lights.  Make sure that you have a light by your bed that is easy to reach.  Do not use any sheets or blankets that are too big for your bed. They should not hang down onto the floor.  Have a firm chair that has side arms. You can use this for support while you get dressed.  Do not have throw rugs and other things on the floor that can make you trip. What can I do in the kitchen?  Clean up any spills right away.  Avoid walking on wet floors.  Keep items that you use a lot in easy-to-reach places.  If you need to reach something above you, use a strong step stool that has a grab bar.  Keep electrical cords out of the way.  Do not use floor polish or wax that makes floors slippery. If you must use wax, use non-skid floor wax.  Do not have throw rugs and other things on the floor that can make you trip. What can I do with my stairs?  Do not leave any items on the stairs.  Make sure that there are  handrails on both sides of the stairs and use them. Fix handrails that are broken or loose. Make sure that handrails are as long as the stairways.  Check any carpeting to make sure that it is firmly attached to the stairs. Fix any carpet that is loose or worn.  Avoid having throw rugs at the top or bottom of the stairs. If you do have throw rugs, attach them to the floor with carpet tape.  Make sure that you have a light switch at the top of the stairs and the bottom of the stairs. If you do not have them, ask someone to add them for you. What else can I do to help prevent falls?  Wear shoes that:  Do not have high heels.  Have rubber bottoms.  Are comfortable and fit you well.  Are closed at the toe. Do not wear sandals.  If you use a stepladder:  Make sure that it is fully opened. Do not climb a closed stepladder.  Make sure that both sides of the stepladder are locked into place.  Ask someone to hold it for you, if possible.  Clearly mark and make sure that you  can see:  Any grab bars or handrails.  First and last steps.  Where the edge of each step is.  Use tools that help you move around (mobility aids) if they are needed. These include:  Canes.  Walkers.  Scooters.  Crutches.  Turn on the lights when you go into a dark area. Replace any light bulbs as soon as they burn out.  Set up your furniture so you have a clear path. Avoid moving your furniture around.  If any of your floors are uneven, fix them.  If there are any pets around you, be aware of where they are.  Review your medicines with your doctor. Some medicines can make you feel dizzy. This can increase your chance of falling. Ask your doctor what other things that you can do to help prevent falls. This information is not intended to replace advice given to you by your health care provider. Make sure you discuss any questions you have with your health care provider. Document Released: 04/12/2009 Document Revised: 11/22/2015 Document Reviewed: 07/21/2014 Elsevier Interactive Patient Education  2017 [employer].

## 2020-01-06 ENCOUNTER — Other Ambulatory Visit: Payer: Self-pay

## 2020-01-06 ENCOUNTER — Ambulatory Visit
Admission: RE | Admit: 2020-01-06 | Discharge: 2020-01-06 | Disposition: A | Discharge status: Home / Self Care | Payer: Medicare Other | Source: Ambulatory Visit | Attending: Family Medicine | Admitting: Family Medicine

## 2020-01-06 DIAGNOSIS — Z1231 Encounter for screening mammogram for malignant neoplasm of breast: Secondary | ICD-10-CM

## 2020-01-19 ENCOUNTER — Other Ambulatory Visit: Payer: Self-pay

## 2020-01-19 ENCOUNTER — Ambulatory Visit (INDEPENDENT_AMBULATORY_CARE_PROVIDER_SITE_OTHER): Payer: Medicare Other | Admitting: Family Medicine

## 2020-01-19 ENCOUNTER — Encounter: Payer: Self-pay | Admitting: Family Medicine

## 2020-01-19 VITALS — BP 134/57 | HR 91 | Resp 16 | Ht 67.0 in | Wt 133.0 lb

## 2020-01-19 DIAGNOSIS — E785 Hyperlipidemia, unspecified: Secondary | ICD-10-CM | POA: Diagnosis not present

## 2020-01-19 DIAGNOSIS — F3341 Major depressive disorder, recurrent, in partial remission: Secondary | ICD-10-CM | POA: Diagnosis not present

## 2020-01-19 DIAGNOSIS — E790 Hyperuricemia without signs of inflammatory arthritis and tophaceous disease: Secondary | ICD-10-CM | POA: Diagnosis not present

## 2020-01-19 DIAGNOSIS — M25571 Pain in right ankle and joints of right foot: Secondary | ICD-10-CM

## 2020-01-19 MED ORDER — FAMOTIDINE 40 MG PO TABS: 40.0000 mg | ORAL_TABLET | Freq: Every day | ORAL | 0 refills | Status: DC

## 2020-01-19 MED ORDER — INDOMETHACIN 25 MG PO CAPS: ORAL_CAPSULE | ORAL | 0 refills | Status: DC

## 2020-01-19 NOTE — Patient Instructions (Addendum)
F/U as before,callif you need me sooner..  Indomethacin is prescribed for acute right  ankle pain and swelling  Uric acid level today please. If this is high, then in 10 to 14 days you will start medication to correct this and reduce risk of recurrent gout   Thanks for choosing [facility], we consider it a privelige to serve you.

## 2020-01-20 LAB — URIC ACID: Uric Acid: 5 mg/dL (ref 3.0–7.2)

## 2020-01-23 ENCOUNTER — Encounter: Payer: Self-pay | Admitting: Family Medicine

## 2020-01-23 DIAGNOSIS — M25571 Pain in right ankle and joints of right foot: Secondary | ICD-10-CM | POA: Insufficient documentation

## 2020-01-23 NOTE — Assessment & Plan Note (Signed)
Hyperlipidemia:Low fat diet discussed and encouraged.   Lipid Panel  Lab Results  Component Value Date   CHOL 155 08/23/2019   HDL 66 08/23/2019   LDLCALC 75 08/23/2019   TRIG 73 08/23/2019   CHOLHDL 2.6 02/20/2018   Controlled, no change in medication

## 2020-01-23 NOTE — Assessment & Plan Note (Signed)
Managed by Psych and improved ?

## 2020-01-23 NOTE — Progress Notes (Signed)
   Tamara Lam     MRN: [id number]      DOB: 1951-03-14   HPI [name] is here with acute right ankle pain and swelling unprovoked by any recent trauma. No previous episode   ROS Denies recent fever or chills. Denies sinus pressure, nasal congestion, ear pain or sore throat. Denies chest congestion, productive cough or wheezing. Denies chest pains, palpitations and leg swelling Denies abdominal pain, nausea, vomiting,diarrhea or constipation.   Denies dysuria, frequency, hesitancy or incontinence. Denies headaches, seizures, numbness, or tingling. Denies depression, anxiety or insomnia. Denies skin break down or rash.   PE  BP (!) 134/57   Pulse 91   Resp 16   Ht 5\' 7"  (1.702 m)   Wt 133 lb (60.3 kg)   SpO2 93%   BMI 20.83 kg/m   Patient alert and oriented and in no cardiopulmonary distress.  HEENT: No facial asymmetry, EOMI,     Neck supple .  Chest: Clear to auscultation bilaterally.  CVS: S1, S2 no murmurs, no S3.Regular rate.  ABD: Soft non tender.   Ext: No edema  MS: Adequate ROM spine, shoulders, hips and knees. decreased rOM right ankle with warmth and tenderness Skin: Intact, no ulcerations or rash noted.  Psych: Good eye contact, normal affect. Memory intact not anxious or depressed appearing.  CNS: CN 2-12 intact, power,  normal throughout.no focal deficits noted.   Assessment & Plan  Acute right ankle pain Short course of indomethacin and check uric acid level  MDD (major depressive disorder), recurrent, in partial remission ([place]) Managed by Psych and improved  Hyperlipidemia LDL goal <100 Hyperlipidemia:Low fat diet discussed and encouraged.   Lipid Panel  Lab Results  Component Value Date   CHOL 155 08/23/2019   HDL 66 08/23/2019   LDLCALC 75 08/23/2019   TRIG 73 08/23/2019   CHOLHDL 2.6 02/20/2018   Controlled, no change in medication

## 2020-01-23 NOTE — Assessment & Plan Note (Signed)
Short course of indomethacin and check uric acid level

## 2020-01-24 ENCOUNTER — Telehealth: Payer: Self-pay | Admitting: Family Medicine

## 2020-01-24 NOTE — Telephone Encounter (Signed)
Patient had lab work done last week and would like to know if results are back and if they are, wants someone to call her to go over them at [phone]

## 2020-01-24 NOTE — Telephone Encounter (Signed)
Patient aware.

## 2020-02-05 DIAGNOSIS — H527 Unspecified disorder of refraction: Secondary | ICD-10-CM | POA: Diagnosis not present

## 2020-02-08 NOTE — Progress Notes (Signed)
Virtual Visit via Telephone Note  I connected with Tamara Lam on 02/13/20 at  1:40 PM EDT by telephone and verified that I am speaking with the correct person using two identifiers.   I discussed the limitations, risks, security and privacy concerns of performing an evaluation and management service by telephone and the availability of in person appointments. I also discussed with the patient that there may be a patient responsible charge related to this service. The patient expressed understanding and agreed to proceed.     I discussed the assessment and treatment plan with the patient. The patient was provided an opportunity to ask questions and all were answered. The patient agreed with the plan and demonstrated an understanding of the instructions.   The patient was advised to call back or seek an in-person evaluation if the symptoms worsen or if the condition fails to improve as anticipated.  Location: patient- home, provider- office   I provided 12 minutes of non-face-to-face time during this encounter.   Norman Clay, MD    [facility] MD/PA/NP OP Progress Note  02/13/2020 1:52 PM Tamara Lam  MRN:  [id number]  Chief Complaint:  Chief Complaint    Depression; Follow-up     HPI:  This is a follow-up appointment for depression.  She states that she has been doing well.  She has started to go to [facility], and takes a walk regularly.  She reports good relationship with her son at home.  She enjoys going out with her daughter, niece, for her sister on Friday.  She tends to feel anxious in public.  She reports difficulty in standing in front of people at church.  Although she wants to avoid it, she has been able to attend to the church. She states that she does not feel relaxed as much compared to the time she was on Valium. After discussed an option of switching to other antidepressant given history of adverse reaction from higher dose of lexapro, she reports preference to stay on the  current medication at this time. She sleeps well. She denies feeling depressed. She has difficulty in concentration.  She has good appetite.  She denies any weight change.  She denies SI.  She has occasional panic attacks.    Daily routine: [employer] three times a week, takes a walk 2-3 times/week,  Employment: unemployed  Household: son, age 84, her daughter lives nearby   Visit Diagnosis:    ICD-10-CM   1. MDD (major depressive disorder), recurrent, in partial remission ([place])  F33.41   2. Anxiety disorder, unspecified type  F41.9     Past Psychiatric History: Please see initial evaluation for full details. I have reviewed the history. No updates at this time.     Past Medical History:  Past Medical History:  Diagnosis Date  . Abnormal CXR (chest x-ray)    Evaluated with Pulmonary- Dr. Luan Pulling- "pt. was told probably due to thin body frame"-saw no issues-PFT test normal.  . Anxiety 1980's   . Arthritis    osteoarthritis -knees, hands  . Chronic constipation    tx. Linzess  . Chronic nausea   . Diabetes mellitus without complication ([place])    L2X level checked borderline x1, then further evaluated -Diabetes ruled out.  . Glaucoma 2004   Dr. Venetia Maxon, laser to left eye Sept 2011, bilateral eye drops for this  . Helicobacter pylori gastritis 04/2004   last EGD   . Hematochezia   . Hx of colonoscopy 08/2005   Dr.  Rourk / hemorrhoids   . Thyroid disease    thyroid goiter, nodules-no problems.  . Weight loss     Past Surgical History:  Procedure Laterality Date  . ABDOMINAL HYSTERECTOMY    . arthoscopic surgery on right knee  2007  . BIOPSY  03/03/2018   Procedure: BIOPSY;  Surgeon: Daneil Dolin, MD;  Location: [facility];  Service: Endoscopy;;  gastric for h. pylori  . CHOLECYSTECTOMY  1990's  . COLONOSCOPY  2011   Dr. Gala Romney: anal canal/internal hemorrhoids, dimintuive rectosigmoid polyp (polypoid rectal mucosa), few sigmoid diverticula   . COLONOSCOPY N/A 10/30/2015   Dr.  Gala Romney: diverticulosis, hemorrhoids, surveillance in 2022   . ESOPHAGOGASTRODUODENOSCOPY  2005   Dr. Gala Romney: diffuse submucosal petechial hemorrhage of te gastric mucosa, some noncompliance of gastric cavity, failure to insufflate fully, multiple gastric biopsies. gastric bx, h.pylori  . ESOPHAGOGASTRODUODENOSCOPY N/A 03/03/2018   Dr. Gala Romney: scattered antral erosions/erythema no ulcer/mass. Bx: mild inflammation and No H.pylori  . TOTAL KNEE ARTHROPLASTY Right 05/26/2016   Procedure: RIGHT TOTAL KNEE ARTHROPLASTY;  Surgeon: Gaynelle Arabian, MD;  Location: [facility];  Service: Orthopedics;  Laterality: Right;  Marland Kitchen VESICOVAGINAL FISTULA CLOSURE W/ TAH  1989    Family Psychiatric History: Please see initial evaluation for full details. I have reviewed the history. No updates at this time.     Family History:  Family History  Problem Relation Age of Onset  . Hypertension Mother   . Glaucoma Mother   . Diverticulosis Mother   . Heart disease Mother   . Lung cancer Father   . Mental illness Sister   . Depression Sister   . Anxiety disorder Sister   . Cancer Sister   . Depression Sister   . Anxiety disorder Sister   . Depression Sister   . Anxiety disorder Sister   . Leukemia Brother        some form of leukemia  . Colon polyps Son   . Depression Daughter   . Colon cancer Neg Hx     Social History:  Social History   Socioeconomic History  . Marital status: Widowed    Spouse name: Not on file  . Number of children: Not on file  . Years of education: Not on file  . Highest education level: Not on file  Occupational History  . Occupation: homemaker     Employer: UNEMPLOYED  Tobacco Use  . Smoking status: Never Smoker  . Smokeless tobacco: Never Used  . Tobacco comment: Never smoked  Vaping Use  . Vaping Use: Never used  Substance and Sexual Activity  . Alcohol use: No    Alcohol/week: 0.0 standard drinks  . Drug use: No  . Sexual activity: Not Currently  Other Topics Concern   . Not on file  Social History Narrative  . Not on file   Social Determinants of Health   Financial Resource Strain:   . Difficulty of Paying Living Expenses:   Food Insecurity: No Food Insecurity  . Worried About Charity fundraiser in the Last Year: Never true  . Ran Out of Food in the Last Year: Never true  Transportation Needs: No Transportation Needs  . Lack of Transportation (Medical): No  . Lack of Transportation (Non-Medical): No  Physical Activity: Sufficiently Active  . Days of Exercise per Week: 3 days  . Minutes of Exercise per Session: 60 min  Stress: No Stress Concern Present  . Feeling of Stress : Not at all  Social Connections:  Moderately Isolated  . Frequency of Communication with Friends and Family: Three times a week  . Frequency of Social Gatherings with Friends and Family: More than three times a week  . Attends Religious Services: More than 4 times per year  . Active Member of Clubs or Organizations: No  . Attends Archivist Meetings: Never  . Marital Status: Widowed    Allergies: No Known Allergies  Metabolic Disorder Labs: Lab Results  Component Value Date   HGBA1C 5.8 (H) 08/23/2019   MPG 114 05/05/2016   No results found for: PROLACTIN Lab Results  Component Value Date   CHOL 155 08/23/2019   TRIG 73 08/23/2019   HDL 66 08/23/2019   CHOLHDL 2.6 02/20/2018   VLDL 13 05/05/2016   LDLCALC 75 08/23/2019   LDLCALC 95 03/23/2019   Lab Results  Component Value Date   TSH 0.723 08/23/2019   TSH 0.511 08/03/2018    Therapeutic Level Labs: No results found for: LITHIUM No results found for: VALPROATE No components found for:  CBMZ  Current Medications: Current Outpatient Medications  Medication Sig Dispense Refill  . Calcium-Magnesium-Vitamin D (CALCIUM 1200+D3 PO) Take 1 tablet by mouth daily.    Marland Kitchen escitalopram (LEXAPRO) 10 MG tablet Take one and a half tablets by mouth once daily 45 tablet 2  . famotidine (PEPCID) 40 MG  tablet Take 1 tablet (40 mg total) by mouth daily. 14 tablet 0  . hydrOXYzine (ATARAX/VISTARIL) 25 MG tablet Take 25 mg by mouth at bedtime as needed.    . indomethacin (INDOCIN) 25 MG capsule Take one capsule by mouth three times daily for 2 days,then one capsule two times daily for 2 days, then one daily for 2 days, then stop 12 capsule 0  . LINZESS 290 MCG CAPS capsule TAKE 1 CAPSULE BY MOUTH  DAILY BEFORE BREAKFAST 90 capsule 3  . montelukast (SINGULAIR) 10 MG tablet Take 10 mg by mouth as needed.    . Multiple Vitamins-Minerals (CENTRUM ADULTS) TABS Take 1 tablet by mouth daily.    . simvastatin (ZOCOR) 20 MG tablet TAKE 1 TABLET BY MOUTH AT  BEDTIME FOR CHOLESTEROL 90 tablet 2  . travoprost, benzalkonium, (TRAVATAN) 0.004 % ophthalmic solution Place 1 drop into both eyes at bedtime.      . vitamin C (ASCORBIC ACID) 500 MG tablet Take 500 mg by mouth daily.     No current facility-administered medications for this visit.     Musculoskeletal: Strength & Muscle Tone: N/A Gait & Station: N/A Patient leans: N/A  Psychiatric Specialty Exam: Review of Systems  Psychiatric/Behavioral: Positive for decreased concentration. Negative for agitation, behavioral problems, confusion, dysphoric mood, hallucinations, self-injury, sleep disturbance and suicidal ideas. The patient is nervous/anxious. The patient is not hyperactive.   All other systems reviewed and are negative.   There were no vitals taken for this visit.There is no height or weight on file to calculate BMI.  General Appearance: NA  Eye Contact:  NA  Speech:  Clear and Coherent  Volume:  Normal  Mood:  Anxious  Affect:  NA  Thought Process:  Coherent  Orientation:  Full (Time, Place, and Person)  Thought Content: Logical   Suicidal Thoughts:  No  Homicidal Thoughts:  No  Memory:  Immediate;   Good  Judgement:  Good  Insight:  Good  Psychomotor Activity:  Normal  Concentration:  Concentration: Good and Attention Span: Good   Recall:  Good  Fund of Knowledge: Good  Language: Good  Akathisia:  No  Handed:  Right  AIMS (if indicated): not done  Assets:  Communication Skills Desire for Improvement  ADL's:  Intact  Cognition: WNL  Sleep:  Good   Screenings: GAD-7     Office Visit from 07/04/2019 in [facility] Office Visit from 12/07/2018 in [facility]  Total GAD-7 Score 20 20    PHQ2-9     Clinical Support from 12/19/2019 in [facility] Visit from 07/04/2019 in [facility] Visit from 03/28/2019 in [facility] from 12/14/2018 in [facility] Visit from 12/07/2018 in [facility]  PHQ-2 Total Score 2 2 2 1 2   PHQ-9 Total Score 2 8 9 7 10        Assessment and Plan:  LAREE GARRON is a 69 y.o. year old female with a history of depression, anxiety, Nontoxic multinodular goiter,diabetes, who presents for follow up appointment for below.   1. MDD (major depressive disorder), recurrent, in partial remission ([place]) 2. Anxiety disorder, unspecified type Although she continues to report occasional anxiety symptoms, it has been manageable overall since the last visit.  Psychosocial stressors includes pandemic, and loss of her husband being concerned.  Will continue current dose of Lexapro to target depression and anxiety.  Discussed behavioral activation.   Plan 1.Continue lexapro 10 mg daily (she self tapered down from 15 mg, which was prescribed by PCP due to worsening in jitteriness) 2.Next appointment:11/15 at 1:20 for 20 mins, video  Past trials of medication:sertraline (more depressed, anxious), fluoxetine, buspirone, mirtazapine, Inderal, valium,  The patient demonstrates the following risk factors for suicide: Chronic risk factors for suicide include:psychiatric disorder ofdepression, anxiety. Acute risk factorsfor suicide include: unemployment. Protective factorsfor this patient  include: positive social support, coping skills and hope for the future. Considering these factors, the overall suicide risk at this point appears to below. Patientisappropriate for outpatient follow up.  Norman Clay, MD 02/13/2020, 1:52 PM

## 2020-02-13 ENCOUNTER — Telehealth (INDEPENDENT_AMBULATORY_CARE_PROVIDER_SITE_OTHER): Payer: Medicare Other | Admitting: Psychiatry

## 2020-02-13 ENCOUNTER — Encounter (HOSPITAL_COMMUNITY): Payer: Self-pay | Admitting: Psychiatry

## 2020-02-13 ENCOUNTER — Telehealth: Payer: Self-pay

## 2020-02-13 DIAGNOSIS — F419 Anxiety disorder, unspecified: Secondary | ICD-10-CM | POA: Diagnosis not present

## 2020-02-13 DIAGNOSIS — F3341 Major depressive disorder, recurrent, in partial remission: Secondary | ICD-10-CM

## 2020-02-13 DIAGNOSIS — E785 Hyperlipidemia, unspecified: Secondary | ICD-10-CM

## 2020-02-13 NOTE — Telephone Encounter (Signed)
Pt inquiring about any updated labs before her visit on 02/20/20.

## 2020-02-13 NOTE — Telephone Encounter (Signed)
No labs in system. What would you like to order before her visit next week

## 2020-02-13 NOTE — Telephone Encounter (Signed)
Fasting lipid, cmp and EGFr pls

## 2020-02-14 NOTE — Telephone Encounter (Signed)
Pt aware and labs ordered 

## 2020-02-15 DIAGNOSIS — E785 Hyperlipidemia, unspecified: Secondary | ICD-10-CM | POA: Diagnosis not present

## 2020-02-16 LAB — CMP14+EGFR
ALT: 17 IU/L (ref 0–32)
AST: 22 IU/L (ref 0–40)
Albumin/Globulin Ratio: 1.9 (ref 1.2–2.2)
Albumin: 4.4 g/dL (ref 3.8–4.8)
Alkaline Phosphatase: 55 IU/L (ref 48–121)
BUN/Creatinine Ratio: 16 (ref 12–28)
BUN: 13 mg/dL (ref 8–27)
Bilirubin Total: 0.3 mg/dL (ref 0.0–1.2)
CO2: 25 mmol/L (ref 20–29)
Calcium: 9.5 mg/dL (ref 8.7–10.3)
Chloride: 105 mmol/L (ref 96–106)
Creatinine, Ser: 0.8 mg/dL (ref 0.57–1.00)
GFR calc Af Amer: 87 mL/min/{1.73_m2} (ref >59)
GFR calc non Af Amer: 75 mL/min/{1.73_m2} (ref >59)
Globulin, Total: 2.3 g/dL (ref 1.5–4.5)
Glucose: 102 mg/dL — ABNORMAL HIGH (ref 65–99)
Potassium: 4.1 mmol/L (ref 3.5–5.2)
Sodium: 142 mmol/L (ref 134–144)
Total Protein: 6.7 g/dL (ref 6.0–8.5)

## 2020-02-16 LAB — LIPID PANEL
Chol/HDL Ratio: 2.8 ratio (ref 0.0–4.4)
Cholesterol, Total: 188 mg/dL (ref 100–199)
HDL: 66 mg/dL (ref >39)
LDL Chol Calc (NIH): 109 mg/dL — ABNORMAL HIGH (ref 0–99)
Triglycerides: 72 mg/dL (ref 0–149)
VLDL Cholesterol Cal: 13 mg/dL (ref 5–40)

## 2020-02-20 ENCOUNTER — Encounter: Payer: Self-pay | Admitting: Family Medicine

## 2020-02-20 ENCOUNTER — Other Ambulatory Visit: Payer: Self-pay

## 2020-02-20 ENCOUNTER — Ambulatory Visit (INDEPENDENT_AMBULATORY_CARE_PROVIDER_SITE_OTHER): Payer: Medicare Other | Admitting: Family Medicine

## 2020-02-20 VITALS — BP 122/80 | HR 83 | Temp 98.5°F | Ht 67.0 in | Wt 134.1 lb

## 2020-02-20 DIAGNOSIS — J302 Other seasonal allergic rhinitis: Secondary | ICD-10-CM | POA: Diagnosis not present

## 2020-02-20 DIAGNOSIS — E785 Hyperlipidemia, unspecified: Secondary | ICD-10-CM | POA: Diagnosis not present

## 2020-02-20 DIAGNOSIS — F411 Generalized anxiety disorder: Secondary | ICD-10-CM | POA: Diagnosis not present

## 2020-02-20 DIAGNOSIS — F332 Major depressive disorder, recurrent severe without psychotic features: Secondary | ICD-10-CM

## 2020-02-20 DIAGNOSIS — H409 Unspecified glaucoma: Secondary | ICD-10-CM | POA: Diagnosis not present

## 2020-02-20 DIAGNOSIS — Z23 Encounter for immunization: Secondary | ICD-10-CM

## 2020-02-20 MED ORDER — FLUTICASONE PROPIONATE 50 MCG/ACT NA SUSP: NASAL | 3 refills | Status: DC

## 2020-02-20 NOTE — Patient Instructions (Signed)
Annual physical exam in office with mD in mid to end January, call if you need me sooner   Thankful you are doing well  New additional medication for allergies, flonase daily  Flu vaccine today  Please reduce fried foods and starchy foods as we dicussed  It is important that you exercise regularly at least 30 minutes 5 times a week. If you develop chest pain, have severe difficulty breathing, or feel very tired, stop exercising immediately and seek medical attention  Thanks for choosing [facility], we consider it a privelige to serve you.

## 2020-02-26 ENCOUNTER — Encounter: Payer: Self-pay | Admitting: Family Medicine

## 2020-02-26 NOTE — Progress Notes (Signed)
   Tamara Lam     MRN: [id number]      DOB: 07-Nov-1950   HPI [name] is here for follow up and re-evaluation of chronic medical conditions, medication management and review of any available recent lab and radiology data.  Preventive health is updated, specifically  Cancer screening and Immunization.   Questions or concerns regarding consultations or procedures which the PT has had in the interim are  addressed. The PT denies any adverse reactions to current medications since the last visit.  C/o increased and uncontrolled allergy symptoms, nasal congestion and drainage, no fer or chills ROS Denies recent fever or chills. Denies  ear pain or sore throat. Denies chest congestion, productive cough or wheezing. Denies chest pains, palpitations and leg swelling Denies abdominal pain, nausea, vomiting,diarrhea or constipation.   Denies dysuria, frequency, hesitancy or incontinence. Denies joint pain, swelling and limitation in mobility. Denies headaches, seizures, numbness, or tingling. Denies depression, anxiety or insomnia. Denies skin break down or rash.   PE  BP 122/80 (BP Location: Left Arm, Patient Position: Sitting, Cuff Size: Normal)   Pulse 83   Temp 98.5 F (36.9 C) (Oral)   Ht 5\' 7"  (1.702 m)   Wt 134 lb 1.3 oz (60.8 kg)   SpO2 97%   BMI 21.00 kg/m   Patient alert and oriented and in no cardiopulmonary distress.  HEENT: No facial asymmetry, EOMI,     Neck supple .  Chest: Clear to auscultation bilaterally.  CVS: S1, S2 no murmurs, no S3.Regular rate.  ABD: Soft non tender.   Ext: No edema  MS: Adequate ROM spine, shoulders, hips and knees.  Skin: Intact, no ulcerations or rash noted.  Psych: Good eye contact, normal affect. Memory intact not anxious or depressed appearing.  CNS: CN 2-12 intact, power,  normal throughout.no focal deficits noted.   Assessment & Plan  Hyperlipidemia LDL goal <100 Hyperlipidemia:Low fat diet discussed and  encouraged.   Lipid Panel  Lab Results  Component Value Date   CHOL 188 02/15/2020   HDL 66 02/15/2020   LDLCALC 109 (H) 02/15/2020   TRIG 72 02/15/2020   CHOLHDL 2.8 02/15/2020     Need to reduce fried and fatty foods  Allergic rhinitis Uncontrolled , add flonase  Anxiety disorder Managed by Psych, controlled on lexapro  Glaucoma Closely folowed by Ophthalmology and controled  Depression, major, recurrent ([place]) Controlled, no change in medication

## 2020-02-26 NOTE — Assessment & Plan Note (Signed)
Controlled, no change in medication  

## 2020-02-26 NOTE — Assessment & Plan Note (Signed)
Closely folowed by Ophthalmology and controled

## 2020-02-26 NOTE — Assessment & Plan Note (Signed)
Uncontrolled , add flonase

## 2020-02-26 NOTE — Assessment & Plan Note (Signed)
Managed by Psych, controlled on lexapro

## 2020-02-26 NOTE — Assessment & Plan Note (Signed)
Hyperlipidemia:Low fat diet discussed and encouraged.   Lipid Panel  Lab Results  Component Value Date   CHOL 188 02/15/2020   HDL 66 02/15/2020   LDLCALC 109 (H) 02/15/2020   TRIG 72 02/15/2020   CHOLHDL 2.8 02/15/2020     Need to reduce fried and fatty foods

## 2020-03-14 DIAGNOSIS — H25813 Combined forms of age-related cataract, bilateral: Secondary | ICD-10-CM | POA: Diagnosis not present

## 2020-03-14 DIAGNOSIS — H401131 Primary open-angle glaucoma, bilateral, mild stage: Secondary | ICD-10-CM | POA: Diagnosis not present

## 2020-03-16 ENCOUNTER — Other Ambulatory Visit: Payer: Self-pay | Admitting: Family Medicine

## 2020-05-03 NOTE — Progress Notes (Deleted)
[facility] MD/PA/NP OP Progress Note  05/03/2020 2:14 PM Tamara Lam  MRN:  [id number]  Chief Complaint:  HPI: *** Visit Diagnosis: No diagnosis found.  Past Psychiatric History: Please see initial evaluation for full details. I have reviewed the history. No updates at this time.     Past Medical History:  Past Medical History:  Diagnosis Date  . Abnormal CXR (chest x-ray)    Evaluated with Pulmonary- Dr. Luan Pulling- "pt. was told probably due to thin body frame"-saw no issues-PFT test normal.  . Anxiety 1980's   . Arthritis    osteoarthritis -knees, hands  . Chronic constipation    tx. Linzess  . Chronic nausea   . Diabetes mellitus without complication ([place])    Z6W level checked borderline x1, then further evaluated -Diabetes ruled out.  . Glaucoma 2004   Dr. Venetia Maxon, laser to left eye Sept 2011, bilateral eye drops for this  . Helicobacter pylori gastritis 04/2004   last EGD   . Hematochezia   . Hx of colonoscopy 08/2005   Dr. Gala Romney / hemorrhoids   . Thyroid disease    thyroid goiter, nodules-no problems.  . Weight loss     Past Surgical History:  Procedure Laterality Date  . ABDOMINAL HYSTERECTOMY    . arthoscopic surgery on right knee  2007  . BIOPSY  03/03/2018   Procedure: BIOPSY;  Surgeon: Daneil Dolin, MD;  [facility];  Service: Endoscopy;;  gastric for h. pylori  . CHOLECYSTECTOMY  1990's  . COLONOSCOPY  2011   Dr. Gala Romney: anal canal/internal hemorrhoids, dimintuive rectosigmoid polyp (polypoid rectal mucosa), few sigmoid diverticula   . COLONOSCOPY N/A 10/30/2015   Dr. Gala Romney: diverticulosis, hemorrhoids, surveillance in 2022   . ESOPHAGOGASTRODUODENOSCOPY  2005   Dr. Gala Romney: diffuse submucosal petechial hemorrhage of te gastric mucosa, some noncompliance of gastric cavity, failure to insufflate fully, multiple gastric biopsies. gastric bx, h.pylori  . ESOPHAGOGASTRODUODENOSCOPY N/A 03/03/2018   Dr. Gala Romney: scattered antral erosions/erythema no ulcer/mass.  Bx: mild inflammation and No H.pylori  . TOTAL KNEE ARTHROPLASTY Right 05/26/2016   Procedure: RIGHT TOTAL KNEE ARTHROPLASTY;  Surgeon: Gaynelle Arabian, MD;  Location: [facility];  Service: Orthopedics;  Laterality: Right;  Marland Kitchen VESICOVAGINAL FISTULA CLOSURE W/ TAH  1989    Family Psychiatric History: Please see initial evaluation for full details. I have reviewed the history. No updates at this time.     Family History:  Family History  Problem Relation Age of Onset  . Hypertension Mother   . Glaucoma Mother   . Diverticulosis Mother   . Heart disease Mother   . Lung cancer Father   . Mental illness Sister   . Depression Sister   . Anxiety disorder Sister   . Cancer Sister   . Depression Sister   . Anxiety disorder Sister   . Depression Sister   . Anxiety disorder Sister   . Leukemia Brother        some form of leukemia  . Colon polyps Son   . Depression Daughter   . Colon cancer Neg Hx     Social History:  Social History   Socioeconomic History  . Marital status: Widowed    Spouse name: Not on file  . Number of children: Not on file  . Years of education: Not on file  . Highest education level: Not on file  Occupational History  . Occupation: homemaker     Employer: UNEMPLOYED  Tobacco Use  . Smoking status: Never Smoker  .  Smokeless tobacco: Never Used  . Tobacco comment: Never smoked  Vaping Use  . Vaping Use: Never used  Substance and Sexual Activity  . Alcohol use: No    Alcohol/week: 0.0 standard drinks  . Drug use: No  . Sexual activity: Not Currently  Other Topics Concern  . Not on file  Social History Narrative  . Not on file   Social Determinants of Health   Financial Resource Strain:   . Difficulty of Paying Living Expenses: Not on file  Food Insecurity: No Food Insecurity  . Worried About Charity fundraiser in the Last Year: Never true  . Ran Out of Food in the Last Year: Never true  Transportation Needs: No Transportation Needs  . Lack of  Transportation (Medical): No  . Lack of Transportation (Non-Medical): No  Physical Activity: Sufficiently Active  . Days of Exercise per Week: 3 days  . Minutes of Exercise per Session: 60 min  Stress: No Stress Concern Present  . Feeling of Stress : Not at all  Social Connections: Moderately Isolated  . Frequency of Communication with Friends and Family: Three times a week  . Frequency of Social Gatherings with Friends and Family: More than three times a week  . Attends Religious Services: More than 4 times per year  . Active Member of Clubs or Organizations: No  . Attends Archivist Meetings: Never  . Marital Status: Widowed    Allergies: No Known Allergies  Metabolic Disorder Labs: Lab Results  Component Value Date   HGBA1C 5.8 (H) 08/23/2019   MPG 114 05/05/2016   No results found for: PROLACTIN Lab Results  Component Value Date   CHOL 188 02/15/2020   TRIG 72 02/15/2020   HDL 66 02/15/2020   CHOLHDL 2.8 02/15/2020   VLDL 13 05/05/2016   LDLCALC 109 (H) 02/15/2020   LDLCALC 75 08/23/2019   Lab Results  Component Value Date   TSH 0.723 08/23/2019   TSH 0.511 08/03/2018    Therapeutic Level Labs: No results found for: LITHIUM No results found for: VALPROATE No components found for:  CBMZ  Current Medications: Current Outpatient Medications  Medication Sig Dispense Refill  . Calcium-Magnesium-Vitamin D (CALCIUM 1200+D3 PO) Take 1 tablet by mouth daily.    Marland Kitchen escitalopram (LEXAPRO) 10 MG tablet Take one and a half tablets by mouth once daily 45 tablet 2  . fluticasone (FLONASE) 50 MCG/ACT nasal spray Use 2 sprays in each nostril once daily 16 g 3  . LINZESS 290 MCG CAPS capsule TAKE 1 CAPSULE BY MOUTH  DAILY BEFORE BREAKFAST 90 capsule 3  . montelukast (SINGULAIR) 10 MG tablet Take 10 mg by mouth as needed.    . Multiple Vitamins-Minerals (CENTRUM ADULTS) TABS Take 1 tablet by mouth daily.    . simvastatin (ZOCOR) 20 MG tablet TAKE 1 TABLET BY MOUTH AT   BEDTIME FOR CHOLESTEROL 90 tablet 3  . travoprost, benzalkonium, (TRAVATAN) 0.004 % ophthalmic solution Place 1 drop into both eyes at bedtime.      . vitamin C (ASCORBIC ACID) 500 MG tablet Take 500 mg by mouth daily.     No current facility-administered medications for this visit.     Musculoskeletal: Strength & Muscle Tone: N/A Gait & Station: N/A Patient leans: N/A  Psychiatric Specialty Exam: Review of Systems  There were no vitals taken for this visit.There is no height or weight on file to calculate BMI.  General Appearance: {Appearance:22683}  Eye Contact:  {BHH EYE CONTACT:22684}  Speech:  Clear and Coherent  Volume:  Normal  Mood:  {BHH MOOD:22306}  Affect:  {Affect (PAA):22687}  Thought Process:  Coherent  Orientation:  Full (Time, Place, and Person)  Thought Content: Logical   Suicidal Thoughts:  {ST/HT (PAA):22692}  Homicidal Thoughts:  {ST/HT (PAA):22692}  Memory:  Immediate;   Good  Judgement:  {Judgement (PAA):22694}  Insight:  {Insight (PAA):22695}  Psychomotor Activity:  Normal  Concentration:  Concentration: Good and Attention Span: Good  Recall:  Good  Fund of Knowledge: Good  Language: Good  Akathisia:  No  Handed:  Right  AIMS (if indicated): not done  Assets:  Communication Skills Desire for Improvement  ADL's:  Intact  Cognition: WNL  Sleep:  {BHH GOOD/FAIR/POOR:22877}   Screenings: GAD-7     Office Visit from 07/04/2019 in [facility] Office Visit from 12/07/2018 in [facility]  Total GAD-7 Score 20 20    PHQ2-9     Clinical Support from 12/19/2019 in [facility] Office Visit from 07/04/2019 in [facility] Office Visit from 03/28/2019 in [facility] from 12/14/2018 in [facility] Office Visit from 12/07/2018 in [facility]  PHQ-2 Total Score 2 2 2 1 2   PHQ-9 Total Score 2 8 9 7 10        Assessment and Plan:  DANETTA PROM is a 69 y.o. year old  female with a history of depression, anxiety,Nontoxic multinodular goiter,diabetes, who presents for follow up appointment for below.    1. MDD (major depressive disorder), recurrent, in partial remission ([place]) 2. Anxiety disorder, unspecified type Although she continues to report occasional anxiety symptoms, it has been manageable overall since the last visit.  Psychosocial stressors includes pandemic, and loss of her husband being concerned.  Will continue current dose of Lexapro to target depression and anxiety.  Discussed behavioral activation.   Plan 1.Continue lexapro 10 mg daily (she self tapered down from 15 mg, which was prescribed by PCP due to worsening in jitteriness) 2.Next appointment:11/15 at 1:20 for 20 mins, video  Past trials of medication:sertraline (more depressed, anxious), fluoxetine, buspirone, mirtazapine, Inderal, valium,  The patient demonstrates the following risk factors for suicide: Chronic risk factors for suicide include:psychiatric disorder ofdepression, anxiety. Acute risk factorsfor suicide include: unemployment. Protective factorsfor this patient include: positive social support, coping skills and hope for the future. Considering these factors, the overall suicide risk at this point appears to below. Patientisappropriate for outpatient follow up.   Norman Clay, MD 05/03/2020, 2:14 PM

## 2020-05-14 ENCOUNTER — Telehealth: Payer: Self-pay | Admitting: Psychiatry

## 2020-05-14 ENCOUNTER — Telehealth (HOSPITAL_COMMUNITY): Payer: Medicare Other | Admitting: Psychiatry

## 2020-05-14 ENCOUNTER — Telehealth: Payer: Medicare Other | Admitting: Psychiatry

## 2020-05-14 ENCOUNTER — Other Ambulatory Visit: Payer: Self-pay

## 2020-05-14 NOTE — Telephone Encounter (Signed)
Sent link for video visit through Epic. Patient did not sign in. Called the patient  for appointment scheduled today. The patient did not answer the phone. Left voice message to contact the office.  

## 2020-05-15 ENCOUNTER — Encounter: Payer: Self-pay | Admitting: Psychiatry

## 2020-05-15 ENCOUNTER — Telehealth (INDEPENDENT_AMBULATORY_CARE_PROVIDER_SITE_OTHER): Payer: Medicare Other | Admitting: Psychiatry

## 2020-05-15 DIAGNOSIS — F419 Anxiety disorder, unspecified: Secondary | ICD-10-CM

## 2020-05-15 DIAGNOSIS — F3341 Major depressive disorder, recurrent, in partial remission: Secondary | ICD-10-CM

## 2020-05-15 MED ORDER — ESCITALOPRAM OXALATE 10 MG PO TABS: 10.0000 mg | ORAL_TABLET | Freq: Every day | ORAL | 2 refills | Status: DC

## 2020-05-15 NOTE — Progress Notes (Signed)
Virtual Visit via Video Note  I connected with Tamara Lam on 05/15/20 at  4:00 PM EST by a video enabled telemedicine application and verified that I am speaking with the correct person using two identifiers.  Location: Patient: home Provider: office   I discussed the limitations of evaluation and management by telemedicine and the availability of in person appointments. The patient expressed understanding and agreed to proceed.    I discussed the assessment and treatment plan with the patient. The patient was provided an opportunity to ask questions and all were answered. The patient agreed with the plan and demonstrated an understanding of the instructions.   The patient was advised to call back or seek an in-person evaluation if the symptoms worsen or if the condition fails to improve as anticipated.  I provided 12 minutes of non-face-to-face time during this encounter.   Norman Clay, MD    [facility] MD/PA/NP OP Progress Note  05/15/2020 4:12 PM Tamara Lam  MRN:  [id number]  Chief Complaint:  Chief Complaint    Depression; Follow-up     HPI:  This is a follow-up appointment for depression.  She states that she feels down today as her sister-in-law deceased.  She has close relationship with her, referring that she is very close with family She has been able to share her grief with her daughter, sister, and her son at home.  She has been feeling depressed at times even before this loss.  However, she is not interested in any medication change as she finds it difficult to relax when she is on medication.  She enjoys having dinner with her daughter and going to the [facility].  She denies insomnia.  She has fair appetite.  She denies change in appetite.  She has fair concentration.  She denies SI.  She feels anxious at times.    Daily routine: [employer] three times a week, takes a walk 2-3 times/week,  Employment: unemployed  Household: son, age 42, her daughter lives nearby Marital  status: widowed, her husband deceased in 2010-09-25 Children: 3   Visit Diagnosis:    ICD-10-CM   1. MDD (major depressive disorder), recurrent, in partial remission ([place])  F33.41   2. Anxiety disorder, unspecified type  F41.9     Past Psychiatric History: Please see initial evaluation for full details. I have reviewed the history. No updates at this time.     Past Medical History:  Past Medical History:  Diagnosis Date  . Abnormal CXR (chest x-ray)    Evaluated with Pulmonary- Dr. Luan Pulling- "pt. was told probably due to thin body frame"-saw no issues-PFT test normal.  . Anxiety 1980's   . Arthritis    osteoarthritis -knees, hands  . Chronic constipation    tx. Linzess  . Chronic nausea   . Diabetes mellitus without complication ([place])    T3S level checked borderline x1, then further evaluated -Diabetes ruled out.  . Glaucoma 09/25/02   Dr. Venetia Maxon, laser to left eye Sept 2011, bilateral eye drops for this  . Helicobacter pylori gastritis 04/2004   last EGD   . Hematochezia   . Hx of colonoscopy 09/24/2005   Dr. Gala Romney / hemorrhoids   . Thyroid disease    thyroid goiter, nodules-no problems.  . Weight loss     Past Surgical History:  Procedure Laterality Date  . ABDOMINAL HYSTERECTOMY    . arthoscopic surgery on right knee  2005-09-24  . BIOPSY  03/03/2018   Procedure: BIOPSY;  Surgeon: Gala Romney,  Cristopher Estimable, MD;  [facility];  Service: Endoscopy;;  gastric for h. pylori  . CHOLECYSTECTOMY  1990's  . COLONOSCOPY  2011   Dr. Gala Romney: anal canal/internal hemorrhoids, dimintuive rectosigmoid polyp (polypoid rectal mucosa), few sigmoid diverticula   . COLONOSCOPY N/A 10/30/2015   Dr. Gala Romney: diverticulosis, hemorrhoids, surveillance in 2022   . ESOPHAGOGASTRODUODENOSCOPY  2005   Dr. Gala Romney: diffuse submucosal petechial hemorrhage of te gastric mucosa, some noncompliance of gastric cavity, failure to insufflate fully, multiple gastric biopsies. gastric bx, h.pylori  . ESOPHAGOGASTRODUODENOSCOPY  N/A 03/03/2018   Dr. Gala Romney: scattered antral erosions/erythema no ulcer/mass. Bx: mild inflammation and No H.pylori  . TOTAL KNEE ARTHROPLASTY Right 05/26/2016   Procedure: RIGHT TOTAL KNEE ARTHROPLASTY;  Surgeon: Gaynelle Arabian, MD;  [facility];  Service: Orthopedics;  Laterality: Right;  Marland Kitchen VESICOVAGINAL FISTULA CLOSURE W/ TAH  1989    Family Psychiatric History: Please see initial evaluation for full details. I have reviewed the history. No updates at this time.     Family History:  Family History  Problem Relation Age of Onset  . Hypertension Mother   . Glaucoma Mother   . Diverticulosis Mother   . Heart disease Mother   . Lung cancer Father   . Mental illness Sister   . Depression Sister   . Anxiety disorder Sister   . Cancer Sister   . Depression Sister   . Anxiety disorder Sister   . Depression Sister   . Anxiety disorder Sister   . Leukemia Brother        some form of leukemia  . Colon polyps Son   . Depression Daughter   . Colon cancer Neg Hx     Social History:  Social History   Socioeconomic History  . Marital status: Widowed    Spouse name: Not on file  . Number of children: Not on file  . Years of education: Not on file  . Highest education level: Not on file  Occupational History  . Occupation: homemaker     Employer: UNEMPLOYED  Tobacco Use  . Smoking status: Never Smoker  . Smokeless tobacco: Never Used  . Tobacco comment: Never smoked  Vaping Use  . Vaping Use: Never used  Substance and Sexual Activity  . Alcohol use: No    Alcohol/week: 0.0 standard drinks  . Drug use: No  . Sexual activity: Not Currently  Other Topics Concern  . Not on file  Social History Narrative  . Not on file   Social Determinants of Health   Financial Resource Strain:   . Difficulty of Paying Living Expenses: Not on file  Food Insecurity: No Food Insecurity  . Worried About Charity fundraiser in the Last Year: Never true  . Ran Out of Food in the Last  Year: Never true  Transportation Needs: No Transportation Needs  . Lack of Transportation (Medical): No  . Lack of Transportation (Non-Medical): No  Physical Activity: Sufficiently Active  . Days of Exercise per Week: 3 days  . Minutes of Exercise per Session: 60 min  Stress: No Stress Concern Present  . Feeling of Stress : Not at all  Social Connections: Moderately Isolated  . Frequency of Communication with Friends and Family: Three times a week  . Frequency of Social Gatherings with Friends and Family: More than three times a week  . Attends Religious Services: More than 4 times per year  . Active Member of Clubs or Organizations: No  . Attends Club  or Organization Meetings: Never  . Marital Status: Widowed    Allergies: No Known Allergies  Metabolic Disorder Labs: Lab Results  Component Value Date   HGBA1C 5.8 (H) 08/23/2019   MPG 114 05/05/2016   No results found for: PROLACTIN Lab Results  Component Value Date   CHOL 188 02/15/2020   TRIG 72 02/15/2020   HDL 66 02/15/2020   CHOLHDL 2.8 02/15/2020   VLDL 13 05/05/2016   LDLCALC 109 (H) 02/15/2020   LDLCALC 75 08/23/2019   Lab Results  Component Value Date   TSH 0.723 08/23/2019   TSH 0.511 08/03/2018    Therapeutic Level Labs: No results found for: LITHIUM No results found for: VALPROATE No components found for:  CBMZ  Current Medications: Current Outpatient Medications  Medication Sig Dispense Refill  . Calcium-Magnesium-Vitamin D (CALCIUM 1200+D3 PO) Take 1 tablet by mouth daily.    Marland Kitchen escitalopram (LEXAPRO) 10 MG tablet Take one and a half tablets by mouth once daily 45 tablet 2  . fluticasone (FLONASE) 50 MCG/ACT nasal spray Use 2 sprays in each nostril once daily 16 g 3  . LINZESS 290 MCG CAPS capsule TAKE 1 CAPSULE BY MOUTH  DAILY BEFORE BREAKFAST 90 capsule 3  . montelukast (SINGULAIR) 10 MG tablet Take 10 mg by mouth as needed.    . Multiple Vitamins-Minerals (CENTRUM ADULTS) TABS Take 1 tablet by  mouth daily.    . simvastatin (ZOCOR) 20 MG tablet TAKE 1 TABLET BY MOUTH AT  BEDTIME FOR CHOLESTEROL 90 tablet 3  . travoprost, benzalkonium, (TRAVATAN) 0.004 % ophthalmic solution Place 1 drop into both eyes at bedtime.      . vitamin C (ASCORBIC ACID) 500 MG tablet Take 500 mg by mouth daily.     No current facility-administered medications for this visit.     Musculoskeletal: Strength & Muscle Tone: N/A Gait & Station: N/A Patient leans: N/A  Psychiatric Specialty Exam: Review of Systems  Psychiatric/Behavioral: Positive for dysphoric mood. Negative for agitation, behavioral problems, confusion, decreased concentration, hallucinations, self-injury, sleep disturbance and suicidal ideas. The patient is nervous/anxious. The patient is not hyperactive.   All other systems reviewed and are negative.   There were no vitals taken for this visit.There is no height or weight on file to calculate BMI.  General Appearance: Fairly Groomed  Eye Contact:  Good  Speech:  Clear and Coherent  Volume:  Normal  Mood:  down  Affect:  Appropriate, Congruent and down at times  Thought Process:  Coherent  Orientation:  Full (Time, Place, and Person)  Thought Content: Logical   Suicidal Thoughts:  No  Homicidal Thoughts:  No  Memory:  Immediate;   Good  Judgement:  Good  Insight:  Fair  Psychomotor Activity:  Normal  Concentration:  Concentration: Good and Attention Span: Good  Recall:  Good  Fund of Knowledge: Good  Language: Good  Akathisia:  No  Handed:  Right  AIMS (if indicated): not done  Assets:  Communication Skills Desire for Improvement  ADL's:  Intact  Cognition: WNL  Sleep:  Good   Screenings: GAD-7     Office Visit from 07/04/2019 in [facility] Office Visit from 12/07/2018 in [facility]  Total GAD-7 Score 20 20    PHQ2-9     Clinical Support from 12/19/2019 in [facility] Office Visit from 07/04/2019 in [facility] Office Visit  from 03/28/2019 in [facility] from 12/14/2018 in [facility] Office Visit  from 12/07/2018 in [facility]  PHQ-2 Total Score 2 2 2 1 2   PHQ-9 Total Score 2 8 9 7 10        Assessment and Plan:  DAJIA GUNNELS is a 70 y.o. year old female with a history of depression, anxiety,Nontoxic multinodular goiter,diabetes, who presents for follow up appointment for below.   1. MDD (major depressive disorder), recurrent, in partial remission ([place]) 2. Anxiety disorder, unspecified type Although she continues to report occasional anxiety and depressive symptoms, she is not interested in any medication adjustment.  Psychosocial stressors includes loss of her sister-in-law.  Will continue current dose of Lexapro to target depression and anxiety.   Plan 1.Continue lexapro 10 mg daily (she self tapered down from 15 mg, which was prescribed by PCP due to worsening in jitteriness) 2.Next appointment: 2/15 at 1:20 for 20 mins, video  Past trials of medication:sertraline (more depressed, anxious), fluoxetine, buspirone, mirtazapine, Inderal, valium,  The patient demonstrates the following risk factors for suicide: Chronic risk factors for suicide include:psychiatric disorder ofdepression, anxiety. Acute risk factorsfor suicide include: unemployment. Protective factorsfor this patient include: positive social support, coping skills and hope for the future. Considering these factors, the overall suicide risk at this point appears to below. Patientisappropriate for outpatient follow up.   Norman Clay, MD 05/15/2020, 4:12 PM

## 2020-05-29 ENCOUNTER — Other Ambulatory Visit: Payer: Self-pay | Admitting: Gastroenterology

## 2020-07-18 ENCOUNTER — Encounter: Payer: Medicare Other | Admitting: Family Medicine

## 2020-07-19 ENCOUNTER — Encounter: Payer: Medicare Other | Admitting: Nurse Practitioner

## 2020-07-26 ENCOUNTER — Encounter: Payer: Self-pay | Admitting: Nurse Practitioner

## 2020-07-26 ENCOUNTER — Ambulatory Visit (INDEPENDENT_AMBULATORY_CARE_PROVIDER_SITE_OTHER): Payer: Medicare Other | Admitting: Nurse Practitioner

## 2020-07-26 ENCOUNTER — Other Ambulatory Visit: Payer: Self-pay

## 2020-07-26 ENCOUNTER — Other Ambulatory Visit: Payer: Self-pay | Admitting: Nurse Practitioner

## 2020-07-26 VITALS — BP 143/88 | HR 66 | Temp 98.3°F | Ht 67.0 in | Wt 130.0 lb

## 2020-07-26 DIAGNOSIS — N6310 Unspecified lump in the right breast, unspecified quadrant: Secondary | ICD-10-CM

## 2020-07-26 DIAGNOSIS — R7989 Other specified abnormal findings of blood chemistry: Secondary | ICD-10-CM | POA: Diagnosis not present

## 2020-07-26 DIAGNOSIS — N644 Mastodynia: Secondary | ICD-10-CM | POA: Insufficient documentation

## 2020-07-26 DIAGNOSIS — R7303 Prediabetes: Secondary | ICD-10-CM | POA: Diagnosis not present

## 2020-07-26 DIAGNOSIS — Z0000 Encounter for general adult medical examination without abnormal findings: Secondary | ICD-10-CM | POA: Diagnosis not present

## 2020-07-26 DIAGNOSIS — E119 Type 2 diabetes mellitus without complications: Secondary | ICD-10-CM | POA: Diagnosis not present

## 2020-07-26 NOTE — Assessment & Plan Note (Signed)
-  will check A1c with next set of labs

## 2020-07-26 NOTE — Assessment & Plan Note (Signed)
-  breast exam performed today; [place] chaperoned -small, tender mass palpated at 12 o'clock on right breast -ordered diagnostic mammogram and ultrasound

## 2020-07-26 NOTE — Patient Instructions (Signed)
It was great meeting you today.  For your right breast pain, we ordered a ultrasound and diagnostic mammogram.  Tamara Lam will call you shortly to set that up.  We will get fasting labs today, and our office will call you with results.

## 2020-07-26 NOTE — Progress Notes (Addendum)
Established Patient Office Visit  Subjective:  Patient ID: Tamara Lam, female    DOB: 02/22/1951  Age: 70 y.o. MRN: [id number]  CC:  Chief Complaint  Patient presents with  . Annual Exam    HPI Tamara Lam presents for physical exam.  No recent lab work. She states she has occasional right breast pain. She is requesting a u/s for her breast pain. She states it has been ongoing for several years.  Past Medical History:  Diagnosis Date  . Abnormal CXR (chest x-ray)    Evaluated with Pulmonary- Dr. Luan Pulling- "pt. was told probably due to thin body frame"-saw no issues-PFT test normal.  . Anxiety 1980's   . Arthritis    osteoarthritis -knees, hands  . Chronic constipation    tx. Linzess  . Chronic nausea   . Diabetes mellitus without complication ([place])    J1B level checked borderline x1, then further evaluated -Diabetes ruled out.  . Glaucoma 2004   Dr. Venetia Maxon, laser to left eye Sept 2011, bilateral eye drops for this  . Helicobacter pylori gastritis 04/2004   last EGD   . Hematochezia   . Hx of colonoscopy 08/2005   Dr. Gala Romney / hemorrhoids   . Thyroid disease    thyroid goiter, nodules-no problems.  . Weight loss     Past Surgical History:  Procedure Laterality Date  . ABDOMINAL HYSTERECTOMY    . arthoscopic surgery on right knee  2007  . BIOPSY  03/03/2018   Procedure: BIOPSY;  Surgeon: Daneil Dolin, MD;  [facility];  Service: Endoscopy;;  gastric for h. pylori  . CHOLECYSTECTOMY  1990's  . COLONOSCOPY  2011   Dr. Gala Romney: anal canal/internal hemorrhoids, dimintuive rectosigmoid polyp (polypoid rectal mucosa), few sigmoid diverticula   . COLONOSCOPY N/A 10/30/2015   Dr. Gala Romney: diverticulosis, hemorrhoids, surveillance in 2022   . ESOPHAGOGASTRODUODENOSCOPY  2005   Dr. Gala Romney: diffuse submucosal petechial hemorrhage of te gastric mucosa, some noncompliance of gastric cavity, failure to insufflate fully, multiple gastric biopsies. gastric bx, h.pylori   . ESOPHAGOGASTRODUODENOSCOPY N/A 03/03/2018   Dr. Gala Romney: scattered antral erosions/erythema no ulcer/mass. Bx: mild inflammation and No H.pylori  . TOTAL KNEE ARTHROPLASTY Right 05/26/2016   Procedure: RIGHT TOTAL KNEE ARTHROPLASTY;  Surgeon: Gaynelle Arabian, MD;  Location: [facility];  Service: Orthopedics;  Laterality: Right;  Marland Kitchen VESICOVAGINAL FISTULA CLOSURE W/ TAH  1989    Family History  Problem Relation Age of Onset  . Hypertension Mother   . Glaucoma Mother   . Diverticulosis Mother   . Heart disease Mother   . Lung cancer Father   . Mental illness Sister   . Depression Sister   . Anxiety disorder Sister   . Cancer Sister   . Depression Sister   . Anxiety disorder Sister   . Depression Sister   . Anxiety disorder Sister   . Leukemia Brother        some form of leukemia  . Colon polyps Son   . Depression Daughter   . Colon cancer Neg Hx     Social History   Socioeconomic History  . Marital status: Widowed    Spouse name: Not on file  . Number of children: Not on file  . Years of education: Not on file  . Highest education level: Not on file  Occupational History  . Occupation: homemaker     Employer: UNEMPLOYED  Tobacco Use  . Smoking status: Never Smoker  . Smokeless tobacco: Never  Used  . Tobacco comment: Never smoked  Vaping Use  . Vaping Use: Never used  Substance and Sexual Activity  . Alcohol use: No    Alcohol/week: 0.0 standard drinks  . Drug use: No  . Sexual activity: Not Currently  Other Topics Concern  . Not on file  Social History Narrative  . Not on file   Social Determinants of Health   Financial Resource Strain: Not on file  Food Insecurity: No Food Insecurity  . Worried About Charity fundraiser in the Last Year: Never true  . Ran Out of Food in the Last Year: Never true  Transportation Needs: No Transportation Needs  . Lack of Transportation (Medical): No  . Lack of Transportation (Non-Medical): No  Physical Activity: Sufficiently  Active  . Days of Exercise per Week: 3 days  . Minutes of Exercise per Session: 60 min  Stress: No Stress Concern Present  . Feeling of Stress : Not at all  Social Connections: Moderately Isolated  . Frequency of Communication with Friends and Family: Three times a week  . Frequency of Social Gatherings with Friends and Family: More than three times a week  . Attends Religious Services: More than 4 times per year  . Active Member of Clubs or Organizations: No  . Attends Archivist Meetings: Never  . Marital Status: Widowed  Intimate Partner Violence: Not on file    Outpatient Medications Prior to Visit  Medication Sig Dispense Refill  . Calcium-Magnesium-Vitamin D (CALCIUM 1200+D3 PO) Take 1 tablet by mouth daily.    Marland Kitchen escitalopram (LEXAPRO) 10 MG tablet Take 1 tablet (10 mg total) by mouth daily. 30 tablet 2  . fluticasone (FLONASE) 50 MCG/ACT nasal spray Use 2 sprays in each nostril once daily 16 g 3  . LINZESS 290 MCG CAPS capsule TAKE 1 CAPSULE BY MOUTH  DAILY BEFORE BREAKFAST 90 capsule 3  . montelukast (SINGULAIR) 10 MG tablet Take 10 mg by mouth as needed.    . Multiple Vitamins-Minerals (CENTRUM ADULTS) TABS Take 1 tablet by mouth daily.    . simvastatin (ZOCOR) 20 MG tablet TAKE 1 TABLET BY MOUTH AT  BEDTIME FOR CHOLESTEROL 90 tablet 3  . travoprost, benzalkonium, (TRAVATAN) 0.004 % ophthalmic solution Place 1 drop into both eyes at bedtime.    . vitamin C (ASCORBIC ACID) 500 MG tablet Take 500 mg by mouth daily.     No facility-administered medications prior to visit.    No Known Allergies  ROS Review of Systems  Constitutional: Negative.   HENT: Negative.   Eyes: Negative.   Respiratory: Negative.   Cardiovascular: Negative.        Right breast pain that comes and goes  Gastrointestinal: Negative.   Endocrine: Negative.   Genitourinary: Negative.   Musculoskeletal: Negative.   Skin: Negative.   Allergic/Immunologic: Negative.   Neurological:  Negative.   Hematological: Negative.   Psychiatric/Behavioral: Negative.       Objective:    Physical Exam Exam conducted with a chaperone present.  Constitutional:      Appearance: Normal appearance.  HENT:     Head: Normocephalic and atraumatic.     Right Ear: Tympanic membrane, ear canal and external ear normal.     Left Ear: Tympanic membrane, ear canal and external ear normal.     Nose: Nose normal.     Mouth/Throat:     Mouth: Mucous membranes are moist.     Pharynx: Oropharynx is clear.  Eyes:  Extraocular Movements: Extraocular movements intact.     Conjunctiva/sclera: Conjunctivae normal.     Pupils: Pupils are equal, round, and reactive to light.  Cardiovascular:     Rate and Rhythm: Normal rate and regular rhythm.     Pulses: Normal pulses.     Heart sounds: Normal heart sounds.  Pulmonary:     Effort: Pulmonary effort is normal.     Breath sounds: Normal breath sounds.  Chest:  Breasts:     Right: Mass and tenderness present. No swelling, bleeding, inverted nipple, nipple discharge, skin change, axillary adenopathy or supraclavicular adenopathy.     Left: Normal. No axillary adenopathy or supraclavicular adenopathy.     Abdominal:     General: Abdomen is flat. Bowel sounds are normal.     Palpations: Abdomen is soft.  Musculoskeletal:        General: Normal range of motion.     Cervical back: Normal range of motion and neck supple.  Lymphadenopathy:     Upper Body:     Right upper body: No supraclavicular, axillary or pectoral adenopathy.     Left upper body: No supraclavicular, axillary or pectoral adenopathy.  Skin:    Capillary Refill: Capillary refill takes less than 2 seconds.  Neurological:     General: No focal deficit present.     Mental Status: She is alert and oriented to person, place, and time.  Psychiatric:        Mood and Affect: Mood normal.        Behavior: Behavior normal.        Thought Content: Thought content normal.         Judgment: Judgment normal.     BP (!) 143/88 (BP Location: Right Arm, Patient Position: Sitting, Cuff Size: Normal)   Pulse 66   Temp 98.3 F (36.8 C) (Oral)   Ht '5\' 7"'  (1.702 m)   Wt 130 lb (59 kg)   SpO2 96%   BMI 20.36 kg/m  Wt Readings from Last 3 Encounters:  07/26/20 130 lb (59 kg)  02/20/20 134 lb 1.3 oz (60.8 kg)  01/19/20 133 lb (60.3 kg)     Health Maintenance Due  Topic Date Due  . URINE MICROALBUMIN  11/12/2016  . HEMOGLOBIN A1C  02/20/2020  . FOOT EXAM  07/03/2020    There are no preventive care reminders to display for this patient.  Lab Results  Component Value Date   TSH 0.723 08/23/2019   Lab Results  Component Value Date   WBC 5.7 08/23/2019   HGB 13.3 08/23/2019   HCT 41.3 08/23/2019   MCV 90 08/23/2019   PLT 187 08/23/2019   Lab Results  Component Value Date   NA 142 02/15/2020   K 4.1 02/15/2020   CO2 25 02/15/2020   GLUCOSE 102 (H) 02/15/2020   BUN 13 02/15/2020   CREATININE 0.80 02/15/2020   BILITOT 0.3 02/15/2020   ALKPHOS 55 02/15/2020   AST 22 02/15/2020   ALT 17 02/15/2020   PROT 6.7 02/15/2020   ALBUMIN 4.4 02/15/2020   CALCIUM 9.5 02/15/2020   ANIONGAP 6 05/28/2016   Lab Results  Component Value Date   CHOL 188 02/15/2020   Lab Results  Component Value Date   HDL 66 02/15/2020   Lab Results  Component Value Date   LDLCALC 109 (H) 02/15/2020   Lab Results  Component Value Date   TRIG 72 02/15/2020   Lab Results  Component Value Date   CHOLHDL 2.8 02/15/2020  Lab Results  Component Value Date   HGBA1C 5.8 (H) 08/23/2019      Assessment & Plan:   Problem List Items Addressed This Visit      Other   Abnormal TSH    -followed by Dr. Dorris Fetch      Routine physical examination    -will check routine fasting labs today      Prediabetes    -will check A1c with next set of labs      Relevant Orders   Hemoglobin A1c   Breast pain, right    -breast exam performed today; [place] chaperoned -small,  tender mass palpated at 12 o'clock on right breast -ordered diagnostic mammogram and ultrasound      Relevant Orders   MM Digital Diagnostic Unilat R   [place] Unlisted Procedure Breast    Other Visit Diagnoses    Preventative health care    -  Primary   Relevant Orders   CMP14+EGFR   Hemoglobin A1c   Lipid Panel With LDL/HDL Ratio   CBC with Differential/Platelet   Breast mass, right       Relevant Orders   MM Digital Diagnostic Unilat R   [place] Unlisted Procedure Breast      No orders of the defined types were placed in this encounter.   Follow-up: Return in about 4 months (around 11/23/2020) for Lab follow-up.    Noreene Larsson, NP

## 2020-07-26 NOTE — Assessment & Plan Note (Signed)
-  followed by Dr. Dorris Fetch

## 2020-07-26 NOTE — Assessment & Plan Note (Signed)
-  will check routine fasting labs today

## 2020-07-27 LAB — CMP14+EGFR
ALT: 17 IU/L (ref 0–32)
AST: 22 IU/L (ref 0–40)
Albumin/Globulin Ratio: 1.6 (ref 1.2–2.2)
Albumin: 4.5 g/dL (ref 3.8–4.8)
Alkaline Phosphatase: 50 IU/L (ref 44–121)
BUN/Creatinine Ratio: 18 (ref 12–28)
BUN: 14 mg/dL (ref 8–27)
Bilirubin Total: 0.4 mg/dL (ref 0.0–1.2)
CO2: 29 mmol/L (ref 20–29)
Calcium: 10 mg/dL (ref 8.7–10.3)
Chloride: 102 mmol/L (ref 96–106)
Creatinine, Ser: 0.77 mg/dL (ref 0.57–1.00)
GFR calc Af Amer: 91 mL/min/{1.73_m2} (ref >59)
GFR calc non Af Amer: 79 mL/min/{1.73_m2} (ref >59)
Globulin, Total: 2.8 g/dL (ref 1.5–4.5)
Glucose: 83 mg/dL (ref 65–99)
Potassium: 4.2 mmol/L (ref 3.5–5.2)
Sodium: 144 mmol/L (ref 134–144)
Total Protein: 7.3 g/dL (ref 6.0–8.5)

## 2020-07-27 LAB — CBC WITH DIFFERENTIAL/PLATELET
Basophils Absolute: 0.1 10*3/uL (ref 0.0–0.2)
Basos: 1 %
EOS (ABSOLUTE): 0 10*3/uL (ref 0.0–0.4)
Eos: 1 %
Hematocrit: 41.8 % (ref 34.0–46.6)
Hemoglobin: 13.5 g/dL (ref 11.1–15.9)
Immature Grans (Abs): 0 10*3/uL (ref 0.0–0.1)
Immature Granulocytes: 0 %
Lymphocytes Absolute: 2.4 10*3/uL (ref 0.7–3.1)
Lymphs: 39 %
MCH: 28.1 pg (ref 26.6–33.0)
MCHC: 32.3 g/dL (ref 31.5–35.7)
MCV: 87 fL (ref 79–97)
Monocytes Absolute: 0.6 10*3/uL (ref 0.1–0.9)
Monocytes: 10 %
Neutrophils Absolute: 3 10*3/uL (ref 1.4–7.0)
Neutrophils: 49 %
Platelets: 194 10*3/uL (ref 150–450)
RBC: 4.81 x10E6/uL (ref 3.77–5.28)
RDW: 12.9 % (ref 11.7–15.4)
WBC: 6 10*3/uL (ref 3.4–10.8)

## 2020-07-27 LAB — HEMOGLOBIN A1C
Est. average glucose Bld gHb Est-mCnc: 126 mg/dL
Hgb A1c MFr Bld: 6 % — ABNORMAL HIGH (ref 4.8–5.6)

## 2020-07-27 LAB — LIPID PANEL WITH LDL/HDL RATIO
Cholesterol, Total: 182 mg/dL (ref 100–199)
HDL: 59 mg/dL (ref >39)
LDL Chol Calc (NIH): 106 mg/dL — ABNORMAL HIGH (ref 0–99)
LDL/HDL Ratio: 1.8 ratio (ref 0.0–3.2)
Triglycerides: 95 mg/dL (ref 0–149)
VLDL Cholesterol Cal: 17 mg/dL (ref 5–40)

## 2020-07-27 NOTE — Progress Notes (Signed)
Her labs look great. Her LDL is slightly elevated at 106, so try to cut back on fried/fatty foods. Her A1c is 6.0 which is stable and in the prediabetic range, so no need to add or change medications.

## 2020-08-08 NOTE — Progress Notes (Signed)
Virtual Visit via Video Note  I connected with Tamara Lam on 08/14/20 at  1:20 PM EST by a video enabled telemedicine application and verified that I am speaking with the correct person using two identifiers.  Location: Patient: home Provider: office Persons participated in the visit- patient, provider   I discussed the limitations of evaluation and management by telemedicine and the availability of in person appointments. The patient expressed understanding and agreed to proceed.   I discussed the assessment and treatment plan with the patient. The patient was provided an opportunity to ask questions and all were answered. The patient agreed with the plan and demonstrated an understanding of the instructions.   The patient was advised to call back or seek an in-person evaluation if the symptoms worsen or if the condition fails to improve as anticipated.  I provided 16 minutes of non-face-to-face time during this encounter.   Norman Clay, MD    [facility] MD/PA/NP OP Progress Note  08/14/2020 1:43 PM Tamara Lam  MRN:  [id number]  Chief Complaint:  Chief Complaint    Follow-up; Depression     HPI:  This is a follow-up appointment for depression and anxiety.  She states that she has been feeling the same.  She has some days she feels depressed without any reason.  She tries to get out, and do the things that she usually feels better that way.  She restarted to go to the [facility] last week, although she has not done it for a while due to poor motivation.  She tends to be worried about "small things."  She talks about her daughter, who is fostering a boy after she found out that she cannot be pregnant.  Her daughter is hoping to adopt this boy.  Tamara Lam is worried about her daughter as she wants her daughter to be happy.  She is also concerned about her church family, who has some issues.  She does not think of these worries have been affecting the patient.  She denies insomnia.  She denies  any change in weight or appetite.  She denies SI.  She does not want to change her medication as she does not want to take any medication as she is concerned of side effect, although she denies any side effect from Lexapro.    Daily routine:[employer] three times a week, takes a walk 2-3 times/week, Employment:unemployed Household:son, age 26, her daughter lives nearby Marital status: widowed, her husband deceased in 2010-09-27 Children: 3  She has four sister, one brother   Visit Diagnosis:    ICD-10-CM   1. MDD (major depressive disorder), recurrent episode, mild ([place])  F33.0   2. Anxiety disorder, unspecified type  F41.9 escitalopram (LEXAPRO) 10 MG tablet  3. MDD (major depressive disorder), recurrent, in partial remission ([facility])  F33.41 escitalopram (LEXAPRO) 10 MG tablet    Past Psychiatric History: Please see initial evaluation for full details. I have reviewed the history. No updates at this time.     Past Medical History:  Past Medical History:  Diagnosis Date  . Abnormal CXR (chest x-ray)    Evaluated with Pulmonary- Dr. Luan Pulling- "pt. was told probably due to thin body frame"-saw no issues-PFT test normal.  . Anxiety 1980's   . Arthritis    osteoarthritis -knees, hands  . Chronic constipation    tx. Linzess  . Chronic nausea   . Diabetes mellitus without complication ([place])    X4J level checked borderline x1, then further evaluated -Diabetes ruled out.  Marland Kitchen  Glaucoma 2004   Dr. Venetia Maxon, laser to left eye Sept 2011, bilateral eye drops for this  . Helicobacter pylori gastritis 04/2004   last EGD   . Hematochezia   . Hx of colonoscopy 08/2005   Dr. Gala Romney / hemorrhoids   . Thyroid disease    thyroid goiter, nodules-no problems.  . Weight loss     Past Surgical History:  Procedure Laterality Date  . ABDOMINAL HYSTERECTOMY    . arthoscopic surgery on right knee  2007  . BIOPSY  03/03/2018   Procedure: BIOPSY;  Surgeon: Daneil Dolin, MD;  [facility];  Service:  Endoscopy;;  gastric for h. pylori  . CHOLECYSTECTOMY  1990's  . COLONOSCOPY  2011   Dr. Gala Romney: anal canal/internal hemorrhoids, dimintuive rectosigmoid polyp (polypoid rectal mucosa), few sigmoid diverticula   . COLONOSCOPY N/A 10/30/2015   Dr. Gala Romney: diverticulosis, hemorrhoids, surveillance in 2022   . ESOPHAGOGASTRODUODENOSCOPY  2005   Dr. Gala Romney: diffuse submucosal petechial hemorrhage of te gastric mucosa, some noncompliance of gastric cavity, failure to insufflate fully, multiple gastric biopsies. gastric bx, h.pylori  . ESOPHAGOGASTRODUODENOSCOPY N/A 03/03/2018   Dr. Gala Romney: scattered antral erosions/erythema no ulcer/mass. Bx: mild inflammation and No H.pylori  . TOTAL KNEE ARTHROPLASTY Right 05/26/2016   Procedure: RIGHT TOTAL KNEE ARTHROPLASTY;  Surgeon: Gaynelle Arabian, MD;  [facility];  Service: Orthopedics;  Laterality: Right;  Marland Kitchen VESICOVAGINAL FISTULA CLOSURE W/ TAH  1989    Family Psychiatric History: Please see initial evaluation for full details. I have reviewed the history. No updates at this time.     Family History:  Family History  Problem Relation Age of Onset  . Hypertension Mother   . Glaucoma Mother   . Diverticulosis Mother   . Heart disease Mother   . Lung cancer Father   . Mental illness Sister   . Depression Sister   . Anxiety disorder Sister   . Cancer Sister   . Depression Sister   . Anxiety disorder Sister   . Depression Sister   . Anxiety disorder Sister   . Leukemia Brother        some form of leukemia  . Colon polyps Son   . Depression Daughter   . Colon cancer Neg Hx     Social History:  Social History   Socioeconomic History  . Marital status: Widowed    Spouse name: Not on file  . Number of children: Not on file  . Years of education: Not on file  . Highest education level: Not on file  Occupational History  . Occupation: homemaker     Employer: UNEMPLOYED  Tobacco Use  . Smoking status: Never Smoker  . Smokeless tobacco:  Never Used  . Tobacco comment: Never smoked  Vaping Use  . Vaping Use: Never used  Substance and Sexual Activity  . Alcohol use: No    Alcohol/week: 0.0 standard drinks  . Drug use: No  . Sexual activity: Not Currently  Other Topics Concern  . Not on file  Social History Narrative  . Not on file   Social Determinants of Health   Financial Resource Strain: Not on file  Food Insecurity: No Food Insecurity  . Worried About Charity fundraiser in the Last Year: Never true  . Ran Out of Food in the Last Year: Never true  Transportation Needs: No Transportation Needs  . Lack of Transportation (Medical): No  . Lack of Transportation (Non-Medical): No  Physical Activity: Sufficiently Active  .  Days of Exercise per Week: 3 days  . Minutes of Exercise per Session: 60 min  Stress: No Stress Concern Present  . Feeling of Stress : Not at all  Social Connections: Moderately Isolated  . Frequency of Communication with Friends and Family: Three times a week  . Frequency of Social Gatherings with Friends and Family: More than three times a week  . Attends Religious Services: More than 4 times per year  . Active Member of Clubs or Organizations: No  . Attends Archivist Meetings: Never  . Marital Status: Widowed    Allergies: No Known Allergies  Metabolic Disorder Labs: Lab Results  Component Value Date   HGBA1C 6.0 (H) 07/26/2020   MPG 114 05/05/2016   No results found for: PROLACTIN Lab Results  Component Value Date   CHOL 182 07/26/2020   TRIG 95 07/26/2020   HDL 59 07/26/2020   CHOLHDL 2.8 02/15/2020   VLDL 13 05/05/2016   LDLCALC 106 (H) 07/26/2020   LDLCALC 109 (H) 02/15/2020   Lab Results  Component Value Date   TSH 0.723 08/23/2019   TSH 0.511 08/03/2018    Therapeutic Level Labs: No results found for: LITHIUM No results found for: VALPROATE No components found for:  CBMZ  Current Medications: Current Outpatient Medications  Medication Sig Dispense  Refill  . Calcium-Magnesium-Vitamin D (CALCIUM 1200+D3 PO) Take 1 tablet by mouth daily.    Marland Kitchen escitalopram (LEXAPRO) 10 MG tablet Take 1 tablet (10 mg total) by mouth daily. 30 tablet 2  . fluticasone (FLONASE) 50 MCG/ACT nasal spray Use 2 sprays in each nostril once daily 16 g 3  . LINZESS 290 MCG CAPS capsule TAKE 1 CAPSULE BY MOUTH  DAILY BEFORE BREAKFAST 90 capsule 3  . montelukast (SINGULAIR) 10 MG tablet Take 10 mg by mouth as needed.    . Multiple Vitamins-Minerals (CENTRUM ADULTS) TABS Take 1 tablet by mouth daily.    . simvastatin (ZOCOR) 20 MG tablet TAKE 1 TABLET BY MOUTH AT  BEDTIME FOR CHOLESTEROL 90 tablet 3  . travoprost, benzalkonium, (TRAVATAN) 0.004 % ophthalmic solution Place 1 drop into both eyes at bedtime.    . vitamin C (ASCORBIC ACID) 500 MG tablet Take 500 mg by mouth daily.     No current facility-administered medications for this visit.     Musculoskeletal: Strength & Muscle Tone: N/A Gait & Station: N/A Patient leans: N/A  Psychiatric Specialty Exam: Review of Systems  Psychiatric/Behavioral: Positive for dysphoric mood. Negative for agitation, behavioral problems, confusion, decreased concentration, hallucinations, self-injury, sleep disturbance and suicidal ideas. The patient is nervous/anxious. The patient is not hyperactive.   All other systems reviewed and are negative.   There were no vitals taken for this visit.There is no height or weight on file to calculate BMI.  General Appearance: Fairly Groomed  Eye Contact:  Good  Speech:  Clear and Coherent  Volume:  Normal  Mood:  Depressed  Affect:  Appropriate, Congruent and euthymic  Thought Process:  Coherent  Orientation:  Full (Time, Place, and Person)  Thought Content: Logical   Suicidal Thoughts:  No  Homicidal Thoughts:  No  Memory:  Immediate;   Good  Judgement:  Good  Insight:  Good  Psychomotor Activity:  Normal  Concentration:  Concentration: Good and Attention Span: Good  Recall:   Good  Fund of Knowledge: Good  Language: Good  Akathisia:  No  Handed:  Right  AIMS (if indicated): not done  Assets:  Communication Skills Desire  for Improvement  ADL's:  Intact  Cognition: WNL  Sleep:  Good   Screenings: GAD-7   Flowsheet Row Office Visit from 07/04/2019 in [facility] Office Visit from 12/07/2018 in [facility]  Total GAD-7 Score 20 20    PHQ2-9   Flowsheet Row Video Visit from 08/14/2020 in [facility] Office Visit from 07/26/2020 in [facility] from 12/19/2019 in [facility] Visit from 07/04/2019 in [facility] Office Visit from 03/28/2019 in [facility]  PHQ-2 Total Score 4 2 2 2 2   PHQ-9 Total Score 14 6 2 8 9     Flowsheet Row Video Visit from 08/14/2020 in [facility] No Risk       Assessment and Plan:  TRU LEOPARD is a 71 y.o. year old female with a history of depression, anxiety,Nontoxic multinodular goiter,diabetes, who presents for follow up appointment for below.   1. MDD (major depressive disorder), recurrent episode, mild ([place]) 2. Anxiety disorder, unspecified type Although she continues to report occasional anxiety with depressive symptoms, she is again not interested in any medication adjustment.  Psychosocial stressors includes loss of her husband, and her society in law.  Will continue current dose of Lexapro to target depression and anxiety.   Plan 1.Continue lexapro 10 mg daily (she self tapered down from 15 mg, which was prescribed by PCP due to worsening in jitteriness) 2.Next appointment: 5/10 at 1 PM for 20 mins, video  Past trials of medication:sertraline (more depressed, anxious), fluoxetine, buspirone, mirtazapine, Inderal, valium,  The patient demonstrates the following risk factors for suicide: Chronic risk factors for suicide include:psychiatric  disorder ofdepression, anxiety. Acute risk factorsfor suicide include: unemployment. Protective factorsfor this patient include: positive social support, coping skills and hope for the future. Considering these factors, the overall suicide risk at this point appears to below. Patientisappropriate for outpatient follow up.   Norman Clay, MD 08/14/2020, 1:43 PM

## 2020-08-14 ENCOUNTER — Telehealth (INDEPENDENT_AMBULATORY_CARE_PROVIDER_SITE_OTHER): Payer: Medicare Other | Admitting: Psychiatry

## 2020-08-14 ENCOUNTER — Other Ambulatory Visit: Payer: Self-pay

## 2020-08-14 ENCOUNTER — Encounter: Payer: Self-pay | Admitting: Psychiatry

## 2020-08-14 DIAGNOSIS — F3341 Major depressive disorder, recurrent, in partial remission: Secondary | ICD-10-CM | POA: Diagnosis not present

## 2020-08-14 DIAGNOSIS — F419 Anxiety disorder, unspecified: Secondary | ICD-10-CM | POA: Diagnosis not present

## 2020-08-14 DIAGNOSIS — F330 Major depressive disorder, recurrent, mild: Secondary | ICD-10-CM

## 2020-08-14 MED ORDER — ESCITALOPRAM OXALATE 10 MG PO TABS: 10.0000 mg | ORAL_TABLET | Freq: Every day | ORAL | 2 refills | Status: DC

## 2020-08-14 NOTE — Patient Instructions (Signed)
1.Continue lexapro 10 mg daily  2.Next appointment: 5/10 at 1 PM

## 2020-08-22 DIAGNOSIS — H25813 Combined forms of age-related cataract, bilateral: Secondary | ICD-10-CM | POA: Diagnosis not present

## 2020-08-22 DIAGNOSIS — H401131 Primary open-angle glaucoma, bilateral, mild stage: Secondary | ICD-10-CM | POA: Diagnosis not present

## 2020-09-04 ENCOUNTER — Other Ambulatory Visit: Payer: Self-pay | Admitting: "Endocrinology

## 2020-09-04 DIAGNOSIS — E042 Nontoxic multinodular goiter: Secondary | ICD-10-CM | POA: Diagnosis not present

## 2020-09-04 LAB — TSH: TSH: 1 (ref 0.41–5.90)

## 2020-09-05 LAB — T4, FREE: Free T4: 1.2 ng/dL (ref 0.82–1.77)

## 2020-09-05 LAB — TSH: TSH: 0.998 u[IU]/mL (ref 0.450–4.500)

## 2020-09-05 LAB — T3, FREE: T3, Free: 3.3 pg/mL (ref 2.0–4.4)

## 2020-09-06 ENCOUNTER — Other Ambulatory Visit: Payer: Self-pay

## 2020-09-06 ENCOUNTER — Ambulatory Visit
Admission: RE | Admit: 2020-09-06 | Discharge: 2020-09-06 | Disposition: A | Discharge status: Home / Self Care | Payer: Medicare Other | Source: Ambulatory Visit | Attending: Nurse Practitioner | Admitting: Nurse Practitioner

## 2020-09-06 ENCOUNTER — Other Ambulatory Visit: Payer: Self-pay | Admitting: Nurse Practitioner

## 2020-09-06 DIAGNOSIS — N6310 Unspecified lump in the right breast, unspecified quadrant: Secondary | ICD-10-CM

## 2020-09-06 DIAGNOSIS — N6001 Solitary cyst of right breast: Secondary | ICD-10-CM | POA: Diagnosis not present

## 2020-09-06 DIAGNOSIS — R922 Inconclusive mammogram: Secondary | ICD-10-CM | POA: Diagnosis not present

## 2020-09-06 DIAGNOSIS — N644 Mastodynia: Secondary | ICD-10-CM

## 2020-09-06 NOTE — Progress Notes (Signed)
Looks like U/S and screening mammogram are set up for 01/24/21. No need to call; radiology went over this with her.

## 2020-09-13 ENCOUNTER — Encounter: Payer: Medicare Other | Admitting: Family Medicine

## 2020-09-13 ENCOUNTER — Ambulatory Visit (INDEPENDENT_AMBULATORY_CARE_PROVIDER_SITE_OTHER): Payer: Medicare Other | Admitting: "Endocrinology

## 2020-09-13 ENCOUNTER — Other Ambulatory Visit: Payer: Self-pay

## 2020-09-13 ENCOUNTER — Encounter: Payer: Self-pay | Admitting: "Endocrinology

## 2020-09-13 VITALS — BP 106/76 | HR 52 | Ht 67.0 in | Wt 131.4 lb

## 2020-09-13 DIAGNOSIS — R7303 Prediabetes: Secondary | ICD-10-CM

## 2020-09-13 DIAGNOSIS — E119 Type 2 diabetes mellitus without complications: Secondary | ICD-10-CM

## 2020-09-13 DIAGNOSIS — E042 Nontoxic multinodular goiter: Secondary | ICD-10-CM | POA: Diagnosis not present

## 2020-09-13 LAB — POCT GLYCOSYLATED HEMOGLOBIN (HGB A1C): HbA1c, POC (controlled diabetic range): 5.9 % (ref 0.0–7.0)

## 2020-09-13 NOTE — Progress Notes (Signed)
09/13/2020  Endocrinology follow-up note  Subjective:    Patient ID: Tamara Lam, female    DOB: 03/02/1951, PCP Fayrene Helper, MD   Past Medical History:  Diagnosis Date  . Abnormal CXR (chest x-ray)    Evaluated with Pulmonary- Dr. Luan Pulling- "pt. was told probably due to thin body frame"-saw no issues-PFT test normal.  . Anxiety 1980's   . Arthritis    osteoarthritis -knees, hands  . Chronic constipation    tx. Linzess  . Chronic nausea   . Diabetes mellitus without complication ([place])    P9J level checked borderline x1, then further evaluated -Diabetes ruled out.  . Glaucoma 2004   Dr. Venetia Maxon, laser to left eye Sept 2011, bilateral eye drops for this  . Helicobacter pylori gastritis 04/2004   last EGD   . Hematochezia   . Hx of colonoscopy 08/2005   Dr. Gala Romney / hemorrhoids   . Thyroid disease    thyroid goiter, nodules-no problems.  . Weight loss    Past Surgical History:  Procedure Laterality Date  . ABDOMINAL HYSTERECTOMY    . arthoscopic surgery on right knee  2007  . BIOPSY  03/03/2018   Procedure: BIOPSY;  Surgeon: Daneil Dolin, MD;  [facility];  Service: Endoscopy;;  gastric for h. pylori  . CHOLECYSTECTOMY  1990's  . COLONOSCOPY  2011   Dr. Gala Romney: anal canal/internal hemorrhoids, dimintuive rectosigmoid polyp (polypoid rectal mucosa), few sigmoid diverticula   . COLONOSCOPY N/A 10/30/2015   Dr. Gala Romney: diverticulosis, hemorrhoids, surveillance in 2022   . ESOPHAGOGASTRODUODENOSCOPY  2005   Dr. Gala Romney: diffuse submucosal petechial hemorrhage of te gastric mucosa, some noncompliance of gastric cavity, failure to insufflate fully, multiple gastric biopsies. gastric bx, h.pylori  . ESOPHAGOGASTRODUODENOSCOPY N/A 03/03/2018   Dr. Gala Romney: scattered antral erosions/erythema no ulcer/mass. Bx: mild inflammation and No H.pylori  . TOTAL KNEE ARTHROPLASTY Right 05/26/2016   Procedure: RIGHT TOTAL KNEE ARTHROPLASTY;  Surgeon: Gaynelle Arabian, MD;  Location:  [facility];  Service: Orthopedics;  Laterality: Right;  Marland Kitchen VESICOVAGINAL FISTULA CLOSURE W/ TAH  1989   Social History   Socioeconomic History  . Marital status: Widowed    Spouse name: Not on file  . Number of children: Not on file  . Years of education: Not on file  . Highest education level: Not on file  Occupational History  . Occupation: homemaker     Employer: UNEMPLOYED  Tobacco Use  . Smoking status: Never Smoker  . Smokeless tobacco: Never Used  . Tobacco comment: Never smoked  Vaping Use  . Vaping Use: Never used  Substance and Sexual Activity  . Alcohol use: No    Alcohol/week: 0.0 standard drinks  . Drug use: No  . Sexual activity: Not Currently  Other Topics Concern  . Not on file  Social History Narrative  . Not on file   Social Determinants of Health   Financial Resource Strain: Not on file  Food Insecurity: No Food Insecurity  . Worried About Charity fundraiser in the Last Year: Never true  . Ran Out of Food in the Last Year: Never true  Transportation Needs: No Transportation Needs  . Lack of Transportation (Medical): No  . Lack of Transportation (Non-Medical): No  Physical Activity: Sufficiently Active  . Days of Exercise per Week: 3 days  . Minutes of Exercise per Session: 60 min  Stress: No Stress Concern Present  . Feeling of Stress : Not at all  Social Connections: Moderately  Isolated  . Frequency of Communication with Friends and Family: Three times a week  . Frequency of Social Gatherings with Friends and Family: More than three times a week  . Attends Religious Services: More than 4 times per year  . Active Member of Clubs or Organizations: No  . Attends Archivist Meetings: Never  . Marital Status: Widowed   Outpatient Encounter Medications as of 09/13/2020  Medication Sig  . Calcium-Magnesium-Vitamin D (CALCIUM 1200+D3 PO) Take 1 tablet by mouth daily.  Marland Kitchen escitalopram (LEXAPRO) 10 MG tablet Take 1 tablet (10 mg total) by mouth  daily.  . fluticasone (FLONASE) 50 MCG/ACT nasal spray Use 2 sprays in each nostril once daily  . LINZESS 290 MCG CAPS capsule TAKE 1 CAPSULE BY MOUTH  DAILY BEFORE BREAKFAST  . montelukast (SINGULAIR) 10 MG tablet Take 10 mg by mouth as needed.  . Multiple Vitamins-Minerals (CENTRUM ADULTS) TABS Take 1 tablet by mouth daily.  . simvastatin (ZOCOR) 20 MG tablet TAKE 1 TABLET BY MOUTH AT  BEDTIME FOR CHOLESTEROL  . travoprost, benzalkonium, (TRAVATAN) 0.004 % ophthalmic solution Place 1 drop into both eyes at bedtime.  . vitamin C (ASCORBIC ACID) 500 MG tablet Take 500 mg by mouth daily.   No facility-administered encounter medications on file as of 09/13/2020.   ALLERGIES: No Known Allergies VACCINATION STATUS: Immunization History  Administered Date(s) Administered  . Fluad Quad(high Dose 65+) 03/28/2019  . Influenza Whole 03/13/2010, 03/12/2011  . Influenza,inj,Quad PF,6+ Mos 04/24/2013, 02/20/2014, 03/27/2015, 04/28/2016, 04/14/2017, 02/24/2018, 02/20/2020  . Moderna Sars-Covid-2 Vaccination 08/04/2019, 09/02/2019, 05/14/2020  . Pneumococcal Conjugate-13 06/20/2014  . Pneumococcal Polysaccharide-23 05/12/2016  . Td 08/16/2009  . Tdap 12/19/2019  . Zoster 02/17/2011    HPI  -70 year old female patient with medical history as above.  She is here to follow-up for euthyroid multinodular goiter, and well-controlled type 2 diabetes.    She is not on any medication for diabetes, her point-of-care A1c remains stable at 5.9%.   She is s/p FNA of thyroid nodule which was benign in 2013, previsit thyroid ultrasound unremarkable for any new findings.  Her thyroid function tests are within normal limits.  She has no new complaints today. Her last thyroid ultrasound was unremarkable or stable findings in 2021.    She is not on any antithyroid intervention nor thyroid hormone supplement. She denies heat/cold intolerance.  She denies any family hx of thyroid dysfunction nor thyroid cancer. she  denies exposure to neck radiation. No dysphagia , nor SOB. she has steady weight.  Review of Systems  Constitutional: + Minimally fluctuating body weight,- fatigue, no subjective hyperthermia/hypothermia    Objective:    BP 106/76   Pulse (!) 52   Ht 5\' 7"  (1.702 m)   Wt 131 lb 6.4 oz (59.6 kg)   BMI 20.58 kg/m   Wt Readings from Last 3 Encounters:  09/13/20 131 lb 6.4 oz (59.6 kg)  07/26/20 130 lb (59 kg)  02/20/20 134 lb 1.3 oz (60.8 kg)    Physical Exam Constitutional:  + Current BMI of 20.1, not in acute distress, stable state of mind.   Eyes: PERRLA, EOMI, no exophthalmos ENT: moist mucous membranes, + decrease in thyromegaly, no cervical lymphadenopathy  CMP ( most recent) CMP     Component Value Date/Time   NA 144 07/26/2020 1600   K 4.2 07/26/2020 1600   CL 102 07/26/2020 1600   CO2 29 07/26/2020 1600   GLUCOSE 83 07/26/2020 1600   GLUCOSE 82 07/25/2016 1451  BUN 14 07/26/2020 1600   CREATININE 0.77 07/26/2020 1600   CREATININE 0.68 07/25/2016 1451   CALCIUM 10.0 07/26/2020 1600   PROT 7.3 07/26/2020 1600   ALBUMIN 4.5 07/26/2020 1600   AST 22 07/26/2020 1600   ALT 17 07/26/2020 1600   ALKPHOS 50 07/26/2020 1600   BILITOT 0.4 07/26/2020 1600   GFRNONAA 79 07/26/2020 1600   GFRNONAA >89 05/05/2016 1012   GFRAA 91 07/26/2020 1600   GFRAA >89 05/05/2016 1012     Diabetic Labs (most recent): Lab Results  Component Value Date   HGBA1C 5.9 09/13/2020   HGBA1C 6.0 (H) 07/26/2020   HGBA1C 5.8 (H) 08/23/2019     Lipid Panel ( most recent) Lipid Panel     Component Value Date/Time   CHOL 182 07/26/2020 1600   TRIG 95 07/26/2020 1600   HDL 59 07/26/2020 1600   CHOLHDL 2.8 02/15/2020 1038   CHOLHDL 2.3 05/05/2016 1012   VLDL 13 05/05/2016 1012   LDLCALC 106 (H) 07/26/2020 1600      Thyroid ultrasound from 08/01/2016  IMPRESSION: 1. The previously biopsied dominant approximately 2.1 cm nodule within the right lobe of the thyroid is grossly  unchanged compared to the 09/2011 examination. Correlation prior biopsy results is recommended. 2. None of the additional discretely measured thyroid nodules, the majority of which appear either spongiform or cystic in composition, meet imaging criteria to recommend percutaneous sampling or dedicated follow-up.    Repeat surveillance thyroid ultrasound on September 06, 2019 IMPRESSION: 1. Similar findings of multinodular goiter. No worrisome new or enlarging thyroid nodules. 2. Previously biopsied dominant nodule within mid aspect of the right lobe of the thyroid (labeled #1), is unchanged compared to the 2013 examination. Correlation previous biopsy results is advised. 3. None of the remaining thyroid nodules, many of which are grossly unchanged since (at least) the 04/2014 examination, meet imaging criteria to recommend percutaneous sampling or continued dedicated follow-up.   Assessment & Plan:   1. Nontoxic multinodular goiter -She has stable euthyroid multinodular goiter-ultrasound imaging studies between 2013-2021.  She is status post fine-needle aspiration of a nodule in the right lobe of her thyroid with benign outcomes in 2013.  Her previsit thyroid function tests are consistent with euthyroid state.  She will not require any intervention with thyroid hormone or antithyroid medications.     She will have repeat thyroid function test and office visit in 1 year.    2. Diabetes mellitus without complication ([place]) -Her point-of-care A1c is 5.9%, will not need any intervention with medications.   -She will have repeat point-of-care A1c during her next visit.     - I advised patient to maintain close follow up with Fayrene Helper, MD for primary care needs.      - Time spent on this patient care encounter:  30 minutes of which 50% was spent in  counseling and the rest reviewing  her current and  previous labs / studies and medications  doses and developing a plan for long  term care, and documenting this care. Berton Bon Cuyler  participated in the discussions, expressed understanding, and voiced agreement with the above plans.  All questions were answered to her satisfaction. she is encouraged to contact clinic should she have any questions or concerns prior to her return visit.   Follow up plan: Return in about 1 year (around 09/13/2021) for F/U with Pre-visit Labs, A1c -NV.  Glade Lloyd, MD Phone: [phone]  Fax: [phone]  -  This note  was partially dictated with voice recognition software. Similar sounding words can be transcribed inadequately or may not  be corrected upon review.  09/13/2020, 4:11 PM

## 2020-10-01 ENCOUNTER — Encounter: Payer: Self-pay | Admitting: Internal Medicine

## 2020-10-29 NOTE — Progress Notes (Signed)
Virtual Visit via Video Note  I connected with Tamara Lam on 11/06/20 at  1:00 PM EDT by a video enabled telemedicine application and verified that I am speaking with the correct person using two identifiers.  Location: Patient: home Provider: office Persons participated in the visit- patient, provider   I discussed the limitations of evaluation and management by telemedicine and the availability of in person appointments. The patient expressed understanding and agreed to proceed.   I discussed the assessment and treatment plan with the patient. The patient was provided an opportunity to ask questions and all were answered. The patient agreed with the plan and demonstrated an understanding of the instructions.   The patient was advised to call back or seek an in-person evaluation if the symptoms worsen or if the condition fails to improve as anticipated.  I provided 11 minutes of non-face-to-face time during this encounter.   Norman Clay, MD    [facility] MD/PA/NP OP Progress Note  11/06/2020 1:24 PM Tamara Lam  MRN:  [id number]  Chief Complaint:  Chief Complaint    Follow-up; Depression     HPI:  This is a follow-up appointment for depression and anxiety.  She states that she has been doing well and has no complaints.  She goes to [facility] regularly. She feels better when she gets out from the house.  She loves taking care of a 70-year-old boy, who her daughter is fostering.  They want to adopt this boy.  He keeps her busy, and she feels motivated due to this.  Although she occasionally feels anxious especially when she wakes up in the morning to go to church/greeting, she has been able to utilize breathing technique.  She denies panic attacks.  She feels less depressed compared to before.  She denies anhedonia.  She has slightly reduced appetite.  She denies change in weight.  She denies SI.  She feels comfortable continuing the current medication regimen.   127 lbs Wt Readings from  Last 3 Encounters:  09/13/20 131 lb 6.4 oz (59.6 kg)  07/26/20 130 lb (59 kg)  02/20/20 134 lb 1.3 oz (60.8 kg)    Daily routine:[employer] three times a week, takes a walk 2-3 times/week,church on Sundays, greeting/welcoming people Employment:unemployed Household:son, age 70, her daughter lives nearby Marital status: widowed, her husband deceased in 10-14-2010 Children: 3 She has four sister, one brother  Visit Diagnosis:    ICD-10-CM   1. Anxiety disorder, unspecified type  F41.9 escitalopram (LEXAPRO) 10 MG tablet  2. MDD (major depressive disorder), recurrent, in partial remission (HCC)  F33.41 escitalopram (LEXAPRO) 10 MG tablet    Past Psychiatric History: Please see initial evaluation for full details. I have reviewed the history. No updates at this time.     Past Medical History:  Past Medical History:  Diagnosis Date  . Abnormal CXR (chest x-ray)    Evaluated with Pulmonary- Dr. Luan Pulling- "pt. was told probably due to thin body frame"-saw no issues-PFT test normal.  . Anxiety 1980's   . Arthritis    osteoarthritis -knees, hands  . Chronic constipation    tx. Linzess  . Chronic nausea   . Diabetes mellitus without complication ([place])    H8N level checked borderline x1, then further evaluated -Diabetes ruled out.  . Glaucoma 14-Oct-2002   Dr. Venetia Maxon, laser to left eye Sept 2011, bilateral eye drops for this  . Helicobacter pylori gastritis 04/2004   last EGD   . Hematochezia   . Hx of colonoscopy  08/2005   Dr. Gala Romney / hemorrhoids   . Thyroid disease    thyroid goiter, nodules-no problems.  . Weight loss     Past Surgical History:  Procedure Laterality Date  . ABDOMINAL HYSTERECTOMY    . arthoscopic surgery on right knee  2007  . BIOPSY  03/03/2018   Procedure: BIOPSY;  Surgeon: Daneil Dolin, MD;  [facility];  Service: Endoscopy;;  gastric for h. pylori  . CHOLECYSTECTOMY  1990's  . COLONOSCOPY  2011   Dr. Gala Romney: anal canal/internal hemorrhoids, dimintuive  rectosigmoid polyp (polypoid rectal mucosa), few sigmoid diverticula   . COLONOSCOPY N/A 10/30/2015   Dr. Gala Romney: diverticulosis, hemorrhoids, surveillance in 2022   . ESOPHAGOGASTRODUODENOSCOPY  2005   Dr. Gala Romney: diffuse submucosal petechial hemorrhage of te gastric mucosa, some noncompliance of gastric cavity, failure to insufflate fully, multiple gastric biopsies. gastric bx, h.pylori  . ESOPHAGOGASTRODUODENOSCOPY N/A 03/03/2018   Dr. Gala Romney: scattered antral erosions/erythema no ulcer/mass. Bx: mild inflammation and No H.pylori  . TOTAL KNEE ARTHROPLASTY Right 05/26/2016   Procedure: RIGHT TOTAL KNEE ARTHROPLASTY;  Surgeon: Gaynelle Arabian, MD;  Location: [facility];  Service: Orthopedics;  Laterality: Right;  Marland Kitchen VESICOVAGINAL FISTULA CLOSURE W/ TAH  1989    Family Psychiatric History: Please see initial evaluation for full details. I have reviewed the history. No updates at this time.     Family History:  Family History  Problem Relation Age of Onset  . Hypertension Mother   . Glaucoma Mother   . Diverticulosis Mother   . Heart disease Mother   . Lung cancer Father   . Mental illness Sister   . Depression Sister   . Anxiety disorder Sister   . Cancer Sister   . Depression Sister   . Anxiety disorder Sister   . Depression Sister   . Anxiety disorder Sister   . Leukemia Brother        some form of leukemia  . Colon polyps Son   . Depression Daughter   . Colon cancer Neg Hx     Social History:  Social History   Socioeconomic History  . Marital status: Widowed    Spouse name: Not on file  . Number of children: Not on file  . Years of education: Not on file  . Highest education level: Not on file  Occupational History  . Occupation: homemaker     Employer: UNEMPLOYED  Tobacco Use  . Smoking status: Never Smoker  . Smokeless tobacco: Never Used  . Tobacco comment: Never smoked  Vaping Use  . Vaping Use: Never used  Substance and Sexual Activity  . Alcohol use: No     Alcohol/week: 0.0 standard drinks  . Drug use: No  . Sexual activity: Not Currently  Other Topics Concern  . Not on file  Social History Narrative  . Not on file   Social Determinants of Health   Financial Resource Strain: Not on file  Food Insecurity: No Food Insecurity  . Worried About Charity fundraiser in the Last Year: Never true  . Ran Out of Food in the Last Year: Never true  Transportation Needs: No Transportation Needs  . Lack of Transportation (Medical): No  . Lack of Transportation (Non-Medical): No  Physical Activity: Sufficiently Active  . Days of Exercise per Week: 3 days  . Minutes of Exercise per Session: 60 min  Stress: No Stress Concern Present  . Feeling of Stress : Not at all  Social Connections: Moderately Isolated  .  Frequency of Communication with Friends and Family: Three times a week  . Frequency of Social Gatherings with Friends and Family: More than three times a week  . Attends Religious Services: More than 4 times per year  . Active Member of Clubs or Organizations: No  . Attends Archivist Meetings: Never  . Marital Status: Widowed    Allergies: No Known Allergies  Metabolic Disorder Labs: Lab Results  Component Value Date   HGBA1C 5.9 09/13/2020   MPG 114 05/05/2016   No results found for: PROLACTIN Lab Results  Component Value Date   CHOL 182 07/26/2020   TRIG 95 07/26/2020   HDL 59 07/26/2020   CHOLHDL 2.8 02/15/2020   VLDL 13 05/05/2016   LDLCALC 106 (H) 07/26/2020   LDLCALC 109 (H) 02/15/2020   Lab Results  Component Value Date   TSH 0.998 09/04/2020   TSH 1.00 09/04/2020    Therapeutic Level Labs: No results found for: LITHIUM No results found for: VALPROATE No components found for:  CBMZ  Current Medications: Current Outpatient Medications  Medication Sig Dispense Refill  . Calcium-Magnesium-Vitamin D (CALCIUM 1200+D3 PO) Take 1 tablet by mouth daily.    Derrill Memo ON 11/10/2020] escitalopram (LEXAPRO) 10 MG  tablet Take 1 tablet (10 mg total) by mouth daily. 30 tablet 2  . fluticasone (FLONASE) 50 MCG/ACT nasal spray Use 2 sprays in each nostril once daily 16 g 3  . LINZESS 290 MCG CAPS capsule TAKE 1 CAPSULE BY MOUTH  DAILY BEFORE BREAKFAST 90 capsule 3  . montelukast (SINGULAIR) 10 MG tablet Take 10 mg by mouth as needed.    . Multiple Vitamins-Minerals (CENTRUM ADULTS) TABS Take 1 tablet by mouth daily.    . simvastatin (ZOCOR) 20 MG tablet TAKE 1 TABLET BY MOUTH AT  BEDTIME FOR CHOLESTEROL 90 tablet 3  . travoprost, benzalkonium, (TRAVATAN) 0.004 % ophthalmic solution Place 1 drop into both eyes at bedtime.    . vitamin C (ASCORBIC ACID) 500 MG tablet Take 500 mg by mouth daily.     No current facility-administered medications for this visit.     Musculoskeletal: Strength & Muscle Tone: N/A Gait & Station: N/A Patient leans: N/A  Psychiatric Specialty Exam: Review of Systems  Psychiatric/Behavioral: Negative for agitation, behavioral problems, confusion, decreased concentration, dysphoric mood, hallucinations, self-injury, sleep disturbance and suicidal ideas. The patient is nervous/anxious. The patient is not hyperactive.   All other systems reviewed and are negative.   There were no vitals taken for this visit.There is no height or weight on file to calculate BMI.  General Appearance: Fairly Groomed  Eye Contact:  Good  Speech:  Clear and Coherent  Volume:  Normal  Mood:  good  Affect:  Appropriate, Congruent and Full Range  Thought Process:  Coherent  Orientation:  Full (Time, Place, and Person)  Thought Content: Logical   Suicidal Thoughts:  No  Homicidal Thoughts:  No  Memory:  Immediate;   Good  Judgement:  Good  Insight:  Good  Psychomotor Activity:  Normal  Concentration:  Concentration: Good and Attention Span: Good  Recall:  Good  Fund of Knowledge: Good  Language: Good  Akathisia:  No  Handed:  Right  AIMS (if indicated): not done  Assets:  Communication  Skills Desire for Improvement  ADL's:  Intact  Cognition: WNL  Sleep:  Good   Screenings: GAD-7   Flowsheet Row Office Visit from 07/04/2019 in [facility] Office Visit from 12/07/2018 in [facility]  Total GAD-7 Score 20 20    PHQ2-9   Flowsheet Row Video Visit from 11/06/2020 in [place] Video Visit from 08/14/2020 in [place] Office Visit from 07/26/2020 in [place] from 12/19/2019 in [facility] Office Visit from 07/04/2019 in [facility]  PHQ-2 Total Score 1 4 2 2 2   PHQ-9 Total Score -- 14 6 2 8     Flowsheet Row Video Visit from 11/06/2020 in [place] Video Visit from 08/14/2020 in [place] No Risk No Risk       Assessment and Plan:  MYRICAL ANDUJO is a 70 y.o. year old female with a history of depression, anxiety,Nontoxic multinodular goiter,diabetes, who presents for follow up appointment for below.   1. Anxiety disorder, unspecified type 2. MDD (major depressive disorder), recurrent, in partial remission ([place]) There has been overall improvement in depressive symptoms and anxiety, which coincided with taking care of a boy, who her daughter is fostering. Psychosocial stressors includes loss of her husband.  Will continue current dose of Lexapro to target depression and anxiety.  Plan 1.Continue lexapro 10 mg daily (worsening in jitteriness at 15 mg) 2.Next appointment: 8/9 at 1 PM for 20 mins, video-she will not be able to come for in person visit due to long distance  Past trials of medication:sertraline (more depressed, anxious), fluoxetine, buspirone, mirtazapine, Inderal, valium,  The patient demonstrates the following risk factors for suicide: Chronic risk factors for suicide include:psychiatric disorder ofdepression, anxiety. Acute risk  factorsfor suicide include: unemployment. Protective factorsfor this patient include: positive social support, coping skills and hope for the future. Considering these factors, the overall suicide risk at this point appears to below. Patientisappropriate for outpatient follow up.  Norman Clay, MD 11/06/2020, 1:24 PM

## 2020-11-06 ENCOUNTER — Telehealth (INDEPENDENT_AMBULATORY_CARE_PROVIDER_SITE_OTHER): Payer: Medicare Other | Admitting: Psychiatry

## 2020-11-06 ENCOUNTER — Encounter: Payer: Self-pay | Admitting: Psychiatry

## 2020-11-06 ENCOUNTER — Other Ambulatory Visit: Payer: Self-pay

## 2020-11-06 DIAGNOSIS — F419 Anxiety disorder, unspecified: Secondary | ICD-10-CM | POA: Diagnosis not present

## 2020-11-06 DIAGNOSIS — F3341 Major depressive disorder, recurrent, in partial remission: Secondary | ICD-10-CM | POA: Diagnosis not present

## 2020-11-06 MED ORDER — ESCITALOPRAM OXALATE 10 MG PO TABS: 10.0000 mg | ORAL_TABLET | Freq: Every day | ORAL | 2 refills | Status: DC

## 2020-11-22 ENCOUNTER — Ambulatory Visit (INDEPENDENT_AMBULATORY_CARE_PROVIDER_SITE_OTHER): Payer: Medicare Other | Admitting: Family Medicine

## 2020-11-22 ENCOUNTER — Encounter: Payer: Self-pay | Admitting: Family Medicine

## 2020-11-22 ENCOUNTER — Other Ambulatory Visit: Payer: Self-pay

## 2020-11-22 VITALS — BP 124/82 | HR 73 | Temp 98.5°F | Ht 67.0 in | Wt 130.0 lb

## 2020-11-22 DIAGNOSIS — I8393 Asymptomatic varicose veins of bilateral lower extremities: Secondary | ICD-10-CM | POA: Diagnosis not present

## 2020-11-22 DIAGNOSIS — F419 Anxiety disorder, unspecified: Secondary | ICD-10-CM

## 2020-11-22 DIAGNOSIS — R8290 Unspecified abnormal findings in urine: Secondary | ICD-10-CM

## 2020-11-22 DIAGNOSIS — E559 Vitamin D deficiency, unspecified: Secondary | ICD-10-CM

## 2020-11-22 DIAGNOSIS — L309 Dermatitis, unspecified: Secondary | ICD-10-CM | POA: Diagnosis not present

## 2020-11-22 DIAGNOSIS — E785 Hyperlipidemia, unspecified: Secondary | ICD-10-CM | POA: Diagnosis not present

## 2020-11-22 DIAGNOSIS — M542 Cervicalgia: Secondary | ICD-10-CM | POA: Diagnosis not present

## 2020-11-22 DIAGNOSIS — L820 Inflamed seborrheic keratosis: Secondary | ICD-10-CM | POA: Diagnosis not present

## 2020-11-22 DIAGNOSIS — E119 Type 2 diabetes mellitus without complications: Secondary | ICD-10-CM

## 2020-11-22 DIAGNOSIS — M5410 Radiculopathy, site unspecified: Secondary | ICD-10-CM | POA: Diagnosis not present

## 2020-11-22 DIAGNOSIS — L218 Other seborrheic dermatitis: Secondary | ICD-10-CM | POA: Diagnosis not present

## 2020-11-22 DIAGNOSIS — E790 Hyperuricemia without signs of inflammatory arthritis and tophaceous disease: Secondary | ICD-10-CM

## 2020-11-22 DIAGNOSIS — G8929 Other chronic pain: Secondary | ICD-10-CM

## 2020-11-22 DIAGNOSIS — L83 Acanthosis nigricans: Secondary | ICD-10-CM | POA: Diagnosis not present

## 2020-11-22 LAB — POCT URINALYSIS DIPSTICK
Bilirubin, UA: NEGATIVE
Blood, UA: NEGATIVE
Glucose, UA: NEGATIVE
Ketones, UA: NEGATIVE
Leukocytes, UA: NEGATIVE
Nitrite, UA: NEGATIVE
Protein, UA: NEGATIVE
Spec Grav, UA: 1.015 (ref 1.010–1.025)
Urobilinogen, UA: 0.2 E.U./dL
pH, UA: 7.5 (ref 5.0–8.0)

## 2020-11-22 MED ORDER — PREDNISONE 5 MG PO TABS: 5.0000 mg | ORAL_TABLET | Freq: Two times a day (BID) | ORAL | 0 refills | Status: AC

## 2020-11-22 MED ORDER — MELOXICAM 7.5 MG PO TABS: 7.5000 mg | ORAL_TABLET | Freq: Every day | ORAL | 0 refills | Status: DC

## 2020-11-22 NOTE — Patient Instructions (Addendum)
F/U IN 5 MONTHS, CALL IF YOU NEED ME SOONER  Fasting lipid, hepaticand vit d 5 to 7  days before next visit  We will check urine for protein in the office today. The blood test for your kidneys is entirely normal   Please get X ay of neck and low back   Short course of prednisone and meloxicam  Is prescribed, if you continue to have shoulder and elbow pain let me know so I can refer you to Ortho of your choice  Thanks for choosing [facility], we consider it a privelige to serve you.   It is important that you exercise regularly at least 30 minutes 5 times a week. If you develop chest pain, have severe difficulty breathing, or feel very tired, stop exercising immediately and seek medical attention   Thanks for choosing  Primary Care, we consider it a privelige to serve you.

## 2020-11-22 NOTE — Progress Notes (Signed)
   Tamara Lam     MRN: [id number]      DOB: [birth year]   HPI [name] is here for follow up and re-evaluation of chronic medical conditions, medication management and review of any available recent lab and radiology data.  Preventive health is updated, specifically  Cancer screening and Immunization.   Questions or concerns regarding consultations or procedures which the PT has had in the interim are  addressed. The PT denies any adverse reactions to current medications since the last visit.  Right arm and forearm pain itermittently x 6 months, today significant pain in right elbow , also  Some inc right shoulder Intermittent right groin pain when she walks ROS Denies recent fever or chills. Denies sinus pressure, nasal congestion, ear pain or sore throat. Denies chest congestion, productive cough or wheezing. Denies chest pains, palpitations and leg swelling Denies abdominal pain, nausea, vomiting,diarrhea or constipation.   Denies dysuria, frequency, hesitancy or incontinence.  Denies headaches, seizures, numbness, or tingling. Denies depression, anxiety or insomnia. Denies skin break down or rash.   PE  BP 138/82 (BP Location: Left Arm, Patient Position: Sitting, Cuff Size: Normal)   Pulse 73   Temp 98.5 F (36.9 C) (Temporal)   Ht 5\' 7"  (1.702 m)   Wt 130 lb (59 kg)   SpO2 97%   BMI 20.36 kg/m   Patient alert and oriented and in no cardiopulmonary distress.  HEENT: No facial asymmetry, EOMI,     Neck supple .  Chest: Clear to auscultation bilaterally.  CVS: S1, S2 no murmurs, no S3.Regular rate.  ABD: Soft non tender.   Ext: No edema  MS: Adequate ROM spine, shoulders, hips and knees.  Skin: Intact, no ulcerations or rash noted.  Psych: Good eye contact, normal affect. Memory intact not anxious or depressed appearing.  CNS: CN 2-12 intact, power,  normal throughout.no focal deficits noted.   Assessment & Plan  Hyperlipidemia LDL goal  <100 Hyperlipidemia:Low fat diet discussed and encouraged.   Lipid Panel  Lab Results  Component Value Date   CHOL 182 07/26/2020   HDL 59 07/26/2020   LDLCALC 106 (H) 07/26/2020   TRIG 95 07/26/2020   CHOLHDL 2.8 02/15/2020     Needs to reduce fat in diet Updated lab needed at/ before next visit.   Neck pain on right side X ray of C spine to determine if there is significant underlying pathology to explain symptoms  Back pain with right-sided radiculopathy Intermittent right gron pain afgravated when she walks x 4 to 6 weeks, X ray lumbar spine to further eval  Abnormal urine Has note forom H/H nurse reporting positive microalb, pt is not diabetic, all prior Ua negative for protein as recently as32 years ago, rept CCUA in office is NORMAL  Anxiety disorder Controlled, no change in medication Managed by Psych

## 2020-11-23 ENCOUNTER — Ambulatory Visit (HOSPITAL_COMMUNITY)
Admission: RE | Admit: 2020-11-23 | Discharge: 2020-11-23 | Disposition: A | Discharge status: Home / Self Care | Payer: Medicare Other | Source: Ambulatory Visit | Attending: Family Medicine | Admitting: Family Medicine

## 2020-11-23 ENCOUNTER — Encounter: Payer: Self-pay | Admitting: Family Medicine

## 2020-11-23 DIAGNOSIS — R8290 Unspecified abnormal findings in urine: Secondary | ICD-10-CM | POA: Insufficient documentation

## 2020-11-23 DIAGNOSIS — M5442 Lumbago with sciatica, left side: Secondary | ICD-10-CM | POA: Insufficient documentation

## 2020-11-23 DIAGNOSIS — G8929 Other chronic pain: Secondary | ICD-10-CM

## 2020-11-23 DIAGNOSIS — M542 Cervicalgia: Secondary | ICD-10-CM | POA: Diagnosis not present

## 2020-11-23 DIAGNOSIS — M545 Low back pain, unspecified: Secondary | ICD-10-CM | POA: Diagnosis not present

## 2020-11-23 NOTE — Assessment & Plan Note (Signed)
Intermittent right gron pain afgravated when she walks x 4 to 6 weeks, X ray lumbar spine to further eval

## 2020-11-23 NOTE — Assessment & Plan Note (Signed)
Has note forom H/H nurse reporting positive microalb, pt is not diabetic, all prior Ua negative for protein as recently as32 years ago, rept CCUA in office is NORMAL

## 2020-11-23 NOTE — Assessment & Plan Note (Signed)
Controlled, no change in medication Managed by Psych 

## 2020-11-23 NOTE — Assessment & Plan Note (Signed)
X ray of C spine to determine if there is significant underlying pathology to explain symptoms

## 2020-11-23 NOTE — Assessment & Plan Note (Signed)
Hyperlipidemia:Low fat diet discussed and encouraged.   Lipid Panel  Lab Results  Component Value Date   CHOL 182 07/26/2020   HDL 59 07/26/2020   LDLCALC 106 (H) 07/26/2020   TRIG 95 07/26/2020   CHOLHDL 2.8 02/15/2020     Needs to reduce fat in diet Updated lab needed at/ before next visit.

## 2020-12-04 DIAGNOSIS — H401131 Primary open-angle glaucoma, bilateral, mild stage: Secondary | ICD-10-CM | POA: Diagnosis not present

## 2020-12-04 DIAGNOSIS — H25813 Combined forms of age-related cataract, bilateral: Secondary | ICD-10-CM | POA: Diagnosis not present

## 2020-12-19 ENCOUNTER — Other Ambulatory Visit: Payer: Self-pay

## 2020-12-19 ENCOUNTER — Ambulatory Visit (INDEPENDENT_AMBULATORY_CARE_PROVIDER_SITE_OTHER): Payer: Medicare Other

## 2020-12-19 VITALS — BP 121/79 | HR 83 | Temp 98.8°F | Ht 67.0 in | Wt 132.0 lb

## 2020-12-19 DIAGNOSIS — Z0000 Encounter for general adult medical examination without abnormal findings: Secondary | ICD-10-CM

## 2020-12-19 NOTE — Progress Notes (Addendum)
Subjective:   Tamara Lam is a 70 y.o. female who presents for Medicare Annual (Subsequent) preventive examination.  Review of Systems    N/A  Cardiac Risk Factors include: advanced age (>8men, >41 women);dyslipidemia     Objective:    Today's Vitals   12/19/20 1551 12/19/20 1556  BP: 121/79   Pulse: 83   Temp: 98.8 F (37.1 C)   TempSrc: Oral   SpO2: 93%   Weight: 132 lb (59.9 kg)   Height: 5\' 7"  (1.702 m)   PainSc:  0-No pain   Body mass index is 20.67 kg/m.  Advanced Directives 12/19/2020 12/19/2019 03/03/2018 09/28/2017 07/23/2016 07/22/2016 07/09/2016  Does Patient Have a Medical Advance Directive? Yes Yes Yes Yes Yes Yes Yes  Type of Paramedic of [place];Living will - Living will - [place];Living will [place];Living will [place];Living will  Does patient want to make changes to medical advance directive? No - Patient declined No - Patient declined - No - Patient declined No - Patient declined No - Patient declined -  Copy of [place] in Chart? No - copy requested - - - Yes Yes Yes  Would patient like information on creating a medical advance directive? - - - - No - Patient declined - No - Patient declined  Pre-existing out of facility DNR order (yellow form or pink MOST form) - - - - - - -    Current Medications (verified) Outpatient Encounter Medications as of 12/19/2020  Medication Sig   Calcium-Magnesium-Vitamin D (CALCIUM 1200+D3 PO) Take 1 tablet by mouth daily.   escitalopram (LEXAPRO) 10 MG tablet Take 1 tablet (10 mg total) by mouth daily.   fluticasone (FLONASE) 50 MCG/ACT nasal spray Use 2 sprays in each nostril once daily   LINZESS 290 MCG CAPS capsule TAKE 1 CAPSULE BY MOUTH  DAILY BEFORE BREAKFAST   Multiple Vitamins-Minerals (CENTRUM ADULTS) TABS Take 1 tablet by mouth daily.   simvastatin (ZOCOR) 20 MG tablet TAKE 1 TABLET BY MOUTH AT  BEDTIME FOR  CHOLESTEROL   travoprost, benzalkonium, (TRAVATAN) 0.004 % ophthalmic solution Place 1 drop into both eyes at bedtime.   vitamin C (ASCORBIC ACID) 500 MG tablet Take 500 mg by mouth daily.   meloxicam (MOBIC) 7.5 MG tablet Take 1 tablet (7.5 mg total) by mouth daily. (Patient not taking: Reported on 12/19/2020)   montelukast (SINGULAIR) 10 MG tablet Take 10 mg by mouth as needed. (Patient not taking: Reported on 12/19/2020)   No facility-administered encounter medications on file as of 12/19/2020.    Allergies (verified) Patient has no known allergies.   History: Past Medical History:  Diagnosis Date   Abnormal CXR (chest x-ray)    Evaluated with Pulmonary- Dr. Luan Pulling- "pt. was told probably due to thin body frame"-saw no issues-PFT test normal.   Anxiety 1980's    Arthritis    osteoarthritis -knees, hands   Chronic constipation    tx. Linzess   Chronic nausea    Diabetes mellitus without complication (HCC)    X9K level checked borderline x1, then further evaluated -Diabetes ruled out.   Glaucoma 2004   Dr. Venetia Maxon, laser to left eye Sept 2011, bilateral eye drops for this   Helicobacter pylori gastritis 04/2004   last EGD    Hematochezia    Hx of colonoscopy 08/2005   Dr. Gala Romney / hemorrhoids    Thyroid disease    thyroid goiter, nodules-no problems.  Weight loss    Past Surgical History:  Procedure Laterality Date   ABDOMINAL HYSTERECTOMY     arthoscopic surgery on right knee  2007   BIOPSY  03/03/2018   Procedure: BIOPSY;  Surgeon: Daneil Dolin, MD;  Location: [facility];  Service: Endoscopy;;  gastric for h. pylori   CHOLECYSTECTOMY  1990's   COLONOSCOPY  2011   Dr. Gala Romney: anal canal/internal hemorrhoids, dimintuive rectosigmoid polyp (polypoid rectal mucosa), few sigmoid diverticula    COLONOSCOPY N/A 10/30/2015   Dr. Gala Romney: diverticulosis, hemorrhoids, surveillance in 2022    ESOPHAGOGASTRODUODENOSCOPY  2005   Dr. Gala Romney: diffuse submucosal petechial hemorrhage of  te gastric mucosa, some noncompliance of gastric cavity, failure to insufflate fully, multiple gastric biopsies. gastric bx, h.pylori   ESOPHAGOGASTRODUODENOSCOPY N/A 03/03/2018   Dr. Gala Romney: scattered antral erosions/erythema no ulcer/mass. Bx: mild inflammation and No H.pylori   TOTAL KNEE ARTHROPLASTY Right 05/26/2016   Procedure: RIGHT TOTAL KNEE ARTHROPLASTY;  Surgeon: Gaynelle Arabian, MD;  Location: [facility];  Service: Orthopedics;  Laterality: Right;   VESICOVAGINAL FISTULA CLOSURE W/ TAH  1989   Family History  Problem Relation Age of Onset   Hypertension Mother    Glaucoma Mother    Diverticulosis Mother    Heart disease Mother    Lung cancer Father    Mental illness Sister    Depression Sister    Anxiety disorder Sister    Cancer Sister    Depression Sister    Anxiety disorder Sister    Depression Sister    Anxiety disorder Sister    Leukemia Brother        some form of leukemia   Colon polyps Son    Depression Daughter    Colon cancer Neg Hx    Social History   Socioeconomic History   Marital status: Widowed    Spouse name: Not on file   Number of children: Not on file   Years of education: Not on file   Highest education level: Not on file  Occupational History   Occupation: homemaker     Employer: UNEMPLOYED  Tobacco Use   Smoking status: Never   Smokeless tobacco: Never   Tobacco comments:    Never smoked  Vaping Use   Vaping Use: Never used  Substance and Sexual Activity   Alcohol use: No    Alcohol/week: 0.0 standard drinks   Drug use: No   Sexual activity: Not Currently  Other Topics Concern   Not on file  Social History Narrative   Not on file   Social Determinants of Health   Financial Resource Strain: Low Risk    Difficulty of Paying Living Expenses: Not hard at all  Food Insecurity: No Food Insecurity   Worried About Charity fundraiser in the Last Year: Never true   [place] in the Last Year: Never true  Transportation Needs: No  Transportation Needs   Lack of Transportation (Medical): No   Lack of Transportation (Non-Medical): No  Physical Activity: Sufficiently Active   Days of Exercise per Week: 3 days   Minutes of Exercise per Session: 50 min  Stress: No Stress Concern Present   Feeling of Stress : Not at all  Social Connections: Moderately Isolated   Frequency of Communication with Friends and Family: More than three times a week   Frequency of Social Gatherings with Friends and Family: More than three times a week   Attends Religious Services: More than 4 times per year  Active Member of Clubs or Organizations: No   Attends Archivist Meetings: Never   Marital Status: Widowed    Tobacco Counseling Counseling given: Not Answered Tobacco comments: Never smoked   Clinical Intake:  Pre-visit preparation completed: Yes  Pain : No/denies pain Pain Score: 0-No pain     Nutritional Risks: None Diabetes: No  How often do you need to have someone help you when you read instructions, pamphlets, or other written materials from your doctor or pharmacy?: 1 - Never  Diabetic?No   Interpreter Needed?: No  Information entered by :: [place] of Daily Living In your present state of health, do you have any difficulty performing the following activities: 12/19/2020  Hearing? N  Vision? N  Difficulty concentrating or making decisions? N  Walking or climbing stairs? N  Dressing or bathing? N  Doing errands, shopping? N  Preparing Food and eating ? N  Using the Toilet? N  In the past six months, have you accidently leaked urine? N  Do you have problems with loss of bowel control? N  Managing your Medications? N  Managing your Finances? N  Housekeeping or managing your Housekeeping? N  Some recent data might be hidden    Patient Care Team: Fayrene Helper, MD as PCP - [place], Alphonse Guild, MD as PCP - Cardiology (Cardiology) Gala Romney Cristopher Estimable, MD as Consulting  Physician (Gastroenterology) Cassandria Anger, MD as Consulting Physician (Endocrinology)  Indicate any recent Medical Services you may have received from other than [facility] providers in the past year (date may be approximate).     Assessment:   This is a routine wellness examination for Caylie.  Hearing/Vision screen Vision Screening - Comments:: Patient states gets every 3 months due to glaucoma. Currently has cataracts awaiting surgical repair of cataracts  Dietary issues and exercise activities discussed: Current Exercise Habits: Structured exercise class, Type of exercise: walking;strength training/weights;stretching, Time (Minutes): 45, Frequency (Times/Week): 3, Weekly Exercise (Minutes/Week): 135, Intensity: Moderate, Exercise limited by: None identified   Goals Addressed             This Visit's Progress    DIET - INCREASE WATER INTAKE   Not on track    Drink more water- at least 3 bottles per day       Gain weight   On track    Starting 07/23/2016 patient would like to gain 8 lbs to equal 125 lbs and be able to maintain that. Patient has achieved goal in 2021 at 131lbs        Depression Screen PHQ 2/9 Scores 12/19/2020 11/22/2020 07/26/2020 12/19/2019 07/04/2019 07/04/2019 03/28/2019  PHQ - 2 Score 1 0 2 2 2  0 2  PHQ- 9 Score - - 6 2 8  - 9  Some encounter information is confidential and restricted. Go to Review Flowsheets activity to see all data.    Fall Risk Fall Risk  12/19/2020 11/22/2020 07/26/2020 01/19/2020 12/19/2019  Falls in the past year? 1 0 0 1 0  Number falls in past yr: 0 0 0 0 0  Injury with Fall? 0 0 0 0 0  Risk for fall due to : No Fall Risks No Fall Risks No Fall Risks - -  Follow up Falls evaluation completed;Falls prevention discussed Falls evaluation completed Falls evaluation completed - -    FALL RISK PREVENTION PERTAINING TO THE HOME:  Any stairs in or around the home? No  If so, are there any without handrails? No  Home free of loose throw rugs  in walkways, pet beds, electrical cords, etc? Yes  Adequate lighting in your home to reduce risk of falls? Yes   ASSISTIVE DEVICES UTILIZED TO PREVENT FALLS:  Life alert? No  Use of a cane, walker or w/c? No  Grab bars in the bathroom? Yes  Shower chair or bench in shower? No  Elevated toilet seat or a handicapped toilet? No   TIMED UP AND GO:  Was the test performed? Yes .  Length of time to ambulate 10 feet: 3 sec.   Gait steady and fast without use of assistive device  Cognitive Function:  Normal cognitive status assessed by direct observation by this Nurse Health Advisor. No abnormalities found.     6CIT Screen 12/19/2019 12/14/2018 09/28/2017 07/22/2016  What Year? 0 points 0 points 0 points 0 points  What month? 0 points 0 points 0 points 0 points  What time? 0 points 0 points 0 points 0 points  Count back from 20 0 points 0 points 0 points 0 points  Months in reverse 0 points 0 points 0 points 0 points  Repeat phrase 0 points 2 points 0 points 0 points  Total Score 0 2 0 0    Immunizations Immunization History  Administered Date(s) Administered   Fluad Quad(high Dose 65+) 03/28/2019   Influenza Whole 03/13/2010, 03/12/2011   Influenza,inj,Quad PF,6+ Mos 04/24/2013, 02/20/2014, 03/27/2015, 04/28/2016, 04/14/2017, 02/24/2018, 02/20/2020   Moderna Sars-Covid-2 Vaccination 08/04/2019, 09/02/2019, 05/14/2020   Pneumococcal Conjugate-13 06/20/2014   Pneumococcal Polysaccharide-23 05/12/2016   Td 08/16/2009   Tdap 12/19/2019   Zoster, Live 02/17/2011    TDAP status: Up to date  Flu Vaccine status: Up to date  Pneumococcal vaccine status: Up to date  Covid-19 vaccine status: Completed vaccines  Qualifies for Shingles Vaccine? Yes   Zostavax completed Yes   Shingrix Completed?: No.    Education has been provided regarding the importance of this vaccine. Patient has been advised to call insurance company to determine out of pocket expense if they have not yet received  this vaccine. Advised may also receive vaccine at local pharmacy or Health Dept. Verbalized acceptance and understanding.  Screening Tests Health Maintenance  Topic Date Due   Zoster Vaccines- Shingrix (1 of 2) Never done   COVID-19 Vaccine (4 - Booster for Moderna series) 08/14/2020   URINE MICROALBUMIN  12/23/2020 (Originally 11/12/2016)   INFLUENZA VACCINE  01/28/2021   HEMOGLOBIN A1C  03/16/2021   MAMMOGRAM  01/05/2022   COLONOSCOPY (Pts 45-38yrs Insurance coverage will need to be confirmed)  10/29/2025   TETANUS/TDAP  12/18/2029   DEXA SCAN  Completed   Hepatitis C Screening  Completed   PNA vac Low Risk Adult  Completed   HPV VACCINES  Aged Out   FOOT EXAM  Discontinued   OPHTHALMOLOGY EXAM  Discontinued    Health Maintenance  Health Maintenance Due  Topic Date Due   Zoster Vaccines- Shingrix (1 of 2) Never done   COVID-19 Vaccine (4 - Booster for Moderna series) 08/14/2020    Colorectal cancer screening: Type of screening: Colonoscopy. Completed 10/30/2015. Repeat every 10 years  Mammogram status: Completed 01/06/2020. Repeat every year  Bone Density status: Completed 07/14/2019. Results reflect: Bone density results: OSTEOPENIA. Repeat every 5 years.  Lung Cancer Screening: (Low Dose CT Chest recommended if Age 89-80 years, 30 pack-year currently smoking OR have quit w/in 15years.) does not qualify.   Lung Cancer Screening Referral: N/A   Additional Screening:  Hepatitis C  Screening: does qualify; Completed 11/05/2015  Vision Screening: Recommended annual ophthalmology exams for early detection of glaucoma and other disorders of the eye. Is the patient up to date with their annual eye exam?  Yes  Who is the provider or what is the name of the office in which the patient attends annual eye exams? Dr.Christopher Groat  If pt is not established with a provider, would they like to be referred to a provider to establish care? No .   Dental Screening: Recommended  annual dental exams for proper oral hygiene  Community Resource Referral / Chronic Care Management: CRR required this visit?  No   CCM required this visit?  No      Plan:     I have personally reviewed and noted the following in the patient's chart:   Medical and social history Use of alcohol, tobacco or illicit drugs  Current medications and supplements including opioid prescriptions.  Functional ability and status Nutritional status Physical activity Advanced directives List of other physicians Hospitalizations, surgeries, and ER visits in previous 12 months Vitals Screenings to include cognitive, depression, and falls Referrals and appointments  In addition, I have reviewed and discussed with patient certain preventive protocols, quality metrics, and best practice recommendations. A written personalized care plan for preventive services as well as general preventive health recommendations were provided to patient.     Ofilia Neas, LPN   [id number]   Nurse Notes: None

## 2020-12-19 NOTE — Patient Instructions (Addendum)
[name] , Thank you for taking time to come for your Medicare Wellness Visit. I appreciate your ongoing commitment to your health goals. Please review the following plan we discussed and let me know if I can assist you in the future.   Screening recommendations/referrals: Colonoscopy: Up to date, next due 11/13/2025 Mammogram: Up to date, next due 01/05/2021 Bone Density: Up to date, next due 07/13/2024 Recommended yearly ophthalmology/optometry visit for glaucoma screening and checkup Recommended yearly dental visit for hygiene and checkup  Vaccinations: Influenza vaccine: Up to date, next due fall 2022  Pneumococcal vaccine: completed series  Tdap vaccine: Up to date, next due 12/18/2029 Shingles vaccine: Currently due, if you would like to receive we recommend that you do so at your local pharmacy    Advanced directives: Please bring in a copy of your advanced medical directives so that we may scan into your chart.  Conditions/risks identified: None   Next appointment: 04/25/2021 @ 1:40 PM with Dr. Moshe Cipro   Preventive Care 65 Years and Older, Female Preventive care refers to lifestyle choices and visits with your health care provider that can promote health and wellness. What does preventive care include? A yearly physical exam. This is also called an annual well check. Dental exams once or twice a year. Routine eye exams. Ask your health care provider how often you should have your eyes checked. Personal lifestyle choices, including: Daily care of your teeth and gums. Regular physical activity. Eating a healthy diet. Avoiding tobacco and drug use. Limiting alcohol use. Practicing safe sex. Taking low-dose aspirin every day. Taking vitamin and mineral supplements as recommended by your health care provider. What happens during an annual well check? The services and screenings done by your health care provider during your annual well check will depend on your age,  overall health, lifestyle risk factors, and family history of disease. Counseling  Your health care provider may ask you questions about your: Alcohol use. Tobacco use. Drug use. Emotional well-being. Home and relationship well-being. Sexual activity. Eating habits. History of falls. Memory and ability to understand (cognition). Work and work Statistician. Reproductive health. Screening  You may have the following tests or measurements: Height, weight, and BMI. Blood pressure. Lipid and cholesterol levels. These may be checked every 5 years, or more frequently if you are over 75 years old. Skin check. Lung cancer screening. You may have this screening every year starting at age 16 if you have a 30-pack-year history of smoking and currently smoke or have quit within the past 15 years. Fecal occult blood test (FOBT) of the stool. You may have this test every year starting at age 64. Flexible sigmoidoscopy or colonoscopy. You may have a sigmoidoscopy every 5 years or a colonoscopy every 10 years starting at age 71. Hepatitis C blood test. Hepatitis B blood test. Sexually transmitted disease (STD) testing. Diabetes screening. This is done by checking your blood sugar (glucose) after you have not eaten for a while (fasting). You may have this done every 1-3 years. Bone density scan. This is done to screen for osteoporosis. You may have this done starting at age 67. Mammogram. This may be done every 1-2 years. Talk to your health care provider about how often you should have regular mammograms. Talk with your health care provider about your test results, treatment options, and if necessary, the need for more tests. Vaccines  Your health care provider may recommend certain vaccines, such as: Influenza vaccine. This is recommended every year. Tetanus, diphtheria,  and acellular pertussis (Tdap, Td) vaccine. You may need a Td booster every 10 years. Zoster vaccine. You may need this after age  35. Pneumococcal 13-valent conjugate (PCV13) vaccine. One dose is recommended after age 23. Pneumococcal polysaccharide (PPSV23) vaccine. One dose is recommended after age 29. Talk to your health care provider about which screenings and vaccines you need and how often you need them. This information is not intended to replace advice given to you by your health care provider. Make sure you discuss any questions you have with your health care provider. Document Released: 07/13/2015 Document Revised: 03/05/2016 Document Reviewed: 04/17/2015 Elsevier Interactive Patient Education  2017 [employer] Prevention in the Home Falls can cause injuries. They can happen to people of all ages. There are many things you can do to make your home safe and to help prevent falls. What can I do on the outside of my home? Regularly fix the edges of walkways and driveways and fix any cracks. Remove anything that might make you trip as you walk through a door, such as a raised step or threshold. Trim any bushes or trees on the path to your home. Use bright outdoor lighting. Clear any walking paths of anything that might make someone trip, such as rocks or tools. Regularly check to see if handrails are loose or broken. Make sure that both sides of any steps have handrails. Any raised decks and porches should have guardrails on the edges. Have any leaves, snow, or ice cleared regularly. Use sand or salt on walking paths during winter. Clean up any spills in your garage right away. This includes oil or grease spills. What can I do in the bathroom? Use night lights. Install grab bars by the toilet and in the tub and shower. Do not use towel bars as grab bars. Use non-skid mats or decals in the tub or shower. If you need to sit down in the shower, use a plastic, non-slip stool. Keep the floor dry. Clean up any water that spills on the floor as soon as it happens. Remove soap buildup in the tub or shower  regularly. Attach bath mats securely with double-sided non-slip rug tape. Do not have throw rugs and other things on the floor that can make you trip. What can I do in the bedroom? Use night lights. Make sure that you have a light by your bed that is easy to reach. Do not use any sheets or blankets that are too big for your bed. They should not hang down onto the floor. Have a firm chair that has side arms. You can use this for support while you get dressed. Do not have throw rugs and other things on the floor that can make you trip. What can I do in the kitchen? Clean up any spills right away. Avoid walking on wet floors. Keep items that you use a lot in easy-to-reach places. If you need to reach something above you, use a strong step stool that has a grab bar. Keep electrical cords out of the way. Do not use floor polish or wax that makes floors slippery. If you must use wax, use non-skid floor wax. Do not have throw rugs and other things on the floor that can make you trip. What can I do with my stairs? Do not leave any items on the stairs. Make sure that there are handrails on both sides of the stairs and use them. Fix handrails that are broken or loose. Make sure  that handrails are as long as the stairways. Check any carpeting to make sure that it is firmly attached to the stairs. Fix any carpet that is loose or worn. Avoid having throw rugs at the top or bottom of the stairs. If you do have throw rugs, attach them to the floor with carpet tape. Make sure that you have a light switch at the top of the stairs and the bottom of the stairs. If you do not have them, ask someone to add them for you. What else can I do to help prevent falls? Wear shoes that: Do not have high heels. Have rubber bottoms. Are comfortable and fit you well. Are closed at the toe. Do not wear sandals. If you use a stepladder: Make sure that it is fully opened. Do not climb a closed stepladder. Make sure that  both sides of the stepladder are locked into place. Ask someone to hold it for you, if possible. Clearly mark and make sure that you can see: Any grab bars or handrails. First and last steps. Where the edge of each step is. Use tools that help you move around (mobility aids) if they are needed. These include: Canes. Walkers. Scooters. Crutches. Turn on the lights when you go into a dark area. Replace any light bulbs as soon as they burn out. Set up your furniture so you have a clear path. Avoid moving your furniture around. If any of your floors are uneven, fix them. If there are any pets around you, be aware of where they are. Review your medicines with your doctor. Some medicines can make you feel dizzy. This can increase your chance of falling. Ask your doctor what other things that you can do to help prevent falls. This information is not intended to replace advice given to you by your health care provider. Make sure you discuss any questions you have with your health care provider. Document Released: 04/12/2009 Document Revised: 11/22/2015 Document Reviewed: 07/21/2014 Elsevier Interactive Patient Education  2017 [employer].

## 2021-01-17 ENCOUNTER — Ambulatory Visit: Payer: Medicare Other | Admitting: Gastroenterology

## 2021-01-24 ENCOUNTER — Ambulatory Visit
Admission: RE | Admit: 2021-01-24 | Discharge: 2021-01-24 | Disposition: A | Discharge status: Home / Self Care | Payer: Medicare Other | Source: Ambulatory Visit | Attending: Nurse Practitioner | Admitting: Nurse Practitioner

## 2021-01-24 ENCOUNTER — Other Ambulatory Visit: Payer: Self-pay

## 2021-01-24 ENCOUNTER — Other Ambulatory Visit: Payer: Self-pay | Admitting: Nurse Practitioner

## 2021-01-24 DIAGNOSIS — R922 Inconclusive mammogram: Secondary | ICD-10-CM | POA: Diagnosis not present

## 2021-01-24 DIAGNOSIS — N6310 Unspecified lump in the right breast, unspecified quadrant: Secondary | ICD-10-CM

## 2021-01-24 DIAGNOSIS — N644 Mastodynia: Secondary | ICD-10-CM

## 2021-01-24 NOTE — Progress Notes (Signed)
Please setup for right breast U/S in 6 months for follow-up imaging for mass in the right breast at 3 o'clock.

## 2021-01-30 NOTE — Progress Notes (Signed)
Virtual Visit via Video Note  I connected with Tamara Lam on 02/05/21 at  1:00 PM EDT by a video enabled telemedicine application and verified that I am speaking with the correct person using two identifiers.  Location: Patient: home Provider: office Persons participated in the visit- patient, provider    I discussed the limitations of evaluation and management by telemedicine and the availability of in person appointments. The patient expressed understanding and agreed to proceed.    I discussed the assessment and treatment plan with the patient. The patient was provided an opportunity to ask questions and all were answered. The patient agreed with the plan and demonstrated an understanding of the instructions.   The patient was advised to call back or seek an in-person evaluation if the symptoms worsen or if the condition fails to improve as anticipated.  I provided 15 minutes of non-face-to-face time during this encounter.   Norman Clay, MD    [facility] MD/PA/NP OP Progress Note  02/05/2021 3:52 PM Tamara Lam  MRN:  [id number]  Chief Complaint:  Chief Complaint   Follow-up; Depression    HPI:  This is a follow-up appointment for depression and anxiety.  She states that she went to ED yesterday as she was concerned whether she was having stroke.  She had dysarthria, followed by pain in her gums and ears for the past 2 weeks.  It was frightening experience for her.  She has been trying to take it one at the time.  She notices that it has been difficult for her to feel relaxed even when she gets her nails done.  She feels constantly tense.  Although she was initially ambivalent about starting medication as she is concerned of side effect, she is open to try quetiapine.  She denies irritability.  She denies panic attacks.  She denies feeling depressed or anhedonia.  She denies SI.   Daily routine: [employer] three times a week, takes a walk 2-3 times/week, church on Sundays,  greeting/welcoming people Employment: unemployed  Household: son, age 37, her daughter lives nearby Marital status: widowed, her husband deceased in 2010-10-06 Children: 3  She has four sister, one brother  Visit Diagnosis:    ICD-10-CM   1. Anxiety disorder, unspecified type  F41.9 escitalopram (LEXAPRO) 10 MG tablet    2. MDD (major depressive disorder), recurrent, in partial remission (HCC)  F33.41 escitalopram (LEXAPRO) 10 MG tablet      Past Psychiatric History: Please see initial evaluation for full details. I have reviewed the history. No updates at this time.     Past Medical History:  Past Medical History:  Diagnosis Date   Abnormal CXR (chest x-ray)    Evaluated with Pulmonary- Dr. Luan Pulling- "pt. was told probably due to thin body frame"-saw no issues-PFT test normal.   Anxiety 1980's    Arthritis    osteoarthritis -knees, hands   Chronic constipation    tx. Linzess   Chronic nausea    Diabetes mellitus without complication (HCC)    [id number] level checked borderline x1, then further evaluated -Diabetes ruled out.   Glaucoma 06-Oct-2002   Dr. Venetia Maxon, laser to left eye Sept 2011, bilateral eye drops for this   Helicobacter pylori gastritis 04/2004   last EGD    Hematochezia    Hx of colonoscopy Oct 05, 2005   Dr. Gala Romney / hemorrhoids    Thyroid disease    thyroid goiter, nodules-no problems.   Weight loss     Past Surgical History:  Procedure  Laterality Date   ABDOMINAL HYSTERECTOMY     arthoscopic surgery on right knee  2007   BIOPSY  03/03/2018   Procedure: BIOPSY;  Surgeon: Daneil Dolin, MD;  Location: [facility];  Service: Endoscopy;;  gastric for h. pylori   CHOLECYSTECTOMY  1990's   COLONOSCOPY  2011   Dr. Gala Romney: anal canal/internal hemorrhoids, dimintuive rectosigmoid polyp (polypoid rectal mucosa), few sigmoid diverticula    COLONOSCOPY N/A 10/30/2015   Dr. Gala Romney: diverticulosis, hemorrhoids, surveillance in 2022    ESOPHAGOGASTRODUODENOSCOPY  2005   Dr. Gala Romney: diffuse  submucosal petechial hemorrhage of te gastric mucosa, some noncompliance of gastric cavity, failure to insufflate fully, multiple gastric biopsies. gastric bx, h.pylori   ESOPHAGOGASTRODUODENOSCOPY N/A 03/03/2018   Dr. Gala Romney: scattered antral erosions/erythema no ulcer/mass. Bx: mild inflammation and No H.pylori   TOTAL KNEE ARTHROPLASTY Right 05/26/2016   Procedure: RIGHT TOTAL KNEE ARTHROPLASTY;  Surgeon: Gaynelle Arabian, MD;  Location: [facility];  Service: Orthopedics;  Laterality: Right;   VESICOVAGINAL FISTULA CLOSURE W/ TAH  1989    Family Psychiatric History: Please see initial evaluation for full details. I have reviewed the history. No updates at this time.     Family History:  Family History  Problem Relation Age of Onset   Hypertension Mother    Glaucoma Mother    Diverticulosis Mother    Heart disease Mother    Lung cancer Father    Mental illness Sister    Depression Sister    Anxiety disorder Sister    Cancer Sister    Depression Sister    Anxiety disorder Sister    Depression Sister    Anxiety disorder Sister    Leukemia Brother        some form of leukemia   Colon polyps Son    Depression Daughter    Colon cancer Neg Hx     Social History:  Social History   Socioeconomic History   Marital status: Widowed    Spouse name: Not on file   Number of children: Not on file   Years of education: Not on file   Highest education level: Not on file  Occupational History   Occupation: homemaker     Employer: UNEMPLOYED  Tobacco Use   Smoking status: Never   Smokeless tobacco: Never   Tobacco comments:    Never smoked  Vaping Use   Vaping Use: Never used  Substance and Sexual Activity   Alcohol use: No    Alcohol/week: 0.0 standard drinks   Drug use: No   Sexual activity: Not Currently  Other Topics Concern   Not on file  Social History Narrative   Not on file   Social Determinants of Health   Financial Resource Strain: Low Risk    Difficulty of Paying  Living Expenses: Not hard at all  Food Insecurity: No Food Insecurity   Worried About Charity fundraiser in the Last Year: Never true   [place] in the Last Year: Never true  Transportation Needs: No Transportation Needs   Lack of Transportation (Medical): No   Lack of Transportation (Non-Medical): No  Physical Activity: Sufficiently Active   Days of Exercise per Week: 3 days   Minutes of Exercise per Session: 50 min  Stress: No Stress Concern Present   Feeling of Stress : Not at all  Social Connections: Moderately Isolated   Frequency of Communication with Friends and Family: More than three times a week   Frequency of Social  Gatherings with Friends and Family: More than three times a week   Attends Religious Services: More than 4 times per year   Active Member of Clubs or Organizations: No   Attends Archivist Meetings: Never   Marital Status: Widowed    Allergies: No Known Allergies  Metabolic Disorder Labs: Lab Results  Component Value Date   HGBA1C 5.9 09/13/2020   MPG 114 05/05/2016   No results found for: PROLACTIN Lab Results  Component Value Date   CHOL 182 07/26/2020   TRIG 95 07/26/2020   HDL 59 07/26/2020   CHOLHDL 2.8 02/15/2020   VLDL 13 05/05/2016   LDLCALC 106 (H) 07/26/2020   LDLCALC 109 (H) 02/15/2020   Lab Results  Component Value Date   TSH 0.998 09/04/2020   TSH 1.00 09/04/2020    Therapeutic Level Labs: No results found for: LITHIUM No results found for: VALPROATE No components found for:  CBMZ  Current Medications: Current Outpatient Medications  Medication Sig Dispense Refill   QUEtiapine (SEROQUEL) 25 MG tablet Take 1 tablet (25 mg total) by mouth at bedtime as needed. 30 tablet 2   artificial tears (LACRILUBE) OINT ophthalmic ointment Place into the right eye at bedtime as needed for dry eyes. 3.5 g 1   Calcium-Magnesium-Vitamin D (CALCIUM 1200+D3 PO) Take 1 tablet by mouth daily.     [START ON 02/09/2021]  escitalopram (LEXAPRO) 10 MG tablet Take 1 tablet (10 mg total) by mouth daily. 30 tablet 1   fluticasone (FLONASE) 50 MCG/ACT nasal spray Use 2 sprays in each nostril once daily 16 g 3   gabapentin (NEURONTIN) 100 MG capsule Take 1 capsule (100 mg total) by mouth 3 (three) times daily. 90 capsule 1   LINZESS 290 MCG CAPS capsule TAKE 1 CAPSULE BY MOUTH  DAILY BEFORE BREAKFAST 90 capsule 3   meloxicam (MOBIC) 7.5 MG tablet Take 1 tablet (7.5 mg total) by mouth daily. 10 tablet 0   montelukast (SINGULAIR) 10 MG tablet Take 10 mg by mouth as needed.     Multiple Vitamins-Minerals (CENTRUM ADULTS) TABS Take 1 tablet by mouth daily.     predniSONE (DELTASONE) 20 MG tablet Take 1 tablet (20 mg total) by mouth 2 (two) times daily with a meal for 5 days. 10 tablet 0   simvastatin (ZOCOR) 20 MG tablet TAKE 1 TABLET BY MOUTH AT  BEDTIME FOR CHOLESTEROL 90 tablet 3   travoprost, benzalkonium, (TRAVATAN) 0.004 % ophthalmic solution Place 1 drop into both eyes at bedtime.     valACYclovir (VALTREX) 1000 MG tablet Take 1 tablet (1,000 mg total) by mouth 3 (three) times daily. 21 tablet 0   vitamin C (ASCORBIC ACID) 500 MG tablet Take 500 mg by mouth daily.     No current facility-administered medications for this visit.     Musculoskeletal: Strength & Muscle Tone:  N/A Gait & Station:  N/A Patient leans: N/A  Psychiatric Specialty Exam: Review of Systems  Psychiatric/Behavioral:  Positive for sleep disturbance. Negative for agitation, behavioral problems, confusion, decreased concentration, dysphoric mood, hallucinations, self-injury and suicidal ideas. The patient is nervous/anxious. The patient is not hyperactive.   All other systems reviewed and are negative.  There were no vitals taken for this visit.There is no height or weight on file to calculate BMI.  General Appearance: Fairly Groomed  Eye Contact:  Good  Speech:  Clear and Coherent  Volume:  Normal  Mood:  Anxious  Affect:  Appropriate,  Congruent, and euthymic (bell's palsy)  Thought Process:  Coherent  Orientation:  Full (Time, Place, and Person)  Thought Content: Logical   Suicidal Thoughts:  No  Homicidal Thoughts:  No  Memory:  Immediate;   Good  Judgement:  Good  Insight:  Good  Psychomotor Activity:  Normal  Concentration:  Concentration: Good and Attention Span: Good  Recall:  Good  Fund of Knowledge: Good  Language: Good  Akathisia:  No  Handed:  Right  AIMS (if indicated): not done  Assets:  Communication Skills Desire for Improvement  ADL's:  Intact  Cognition: WNL  Sleep:  Fair   Screenings: GAD-7    [place] Office Visit from 07/04/2019 in [facility] Office Visit from 12/07/2018 in [facility]  Total GAD-7 Score 20 20      PHQ2-9    [facility] Visit from 02/05/2021 in [place] from 12/19/2020 in [facility] Office Visit from 11/22/2020 in [facility] Video Visit from 11/06/2020 in [place] Video Visit from 08/14/2020 in [facility]  PHQ-2 Total Score 0 1 0 1 4  PHQ-9 Total Score -- -- -- -- [facility] ED from 02/04/2021 in [place] Video Visit from 11/06/2020 in [place] Video Visit from 08/14/2020 in [place] No Risk No Risk No Risk        Assessment and Plan:  Tamara Lam is a 70 y.o. year old female with a history of depression, anxiety, Nontoxic multinodular goiter, diabetes, who presents for follow up appointment for below.   1. Anxiety disorder, unspecified type 2. MDD (major depressive disorder), recurrent, in partial remission ([place]) She reports slight worsening in anxiety since the last visit, although she denies any depressive symptoms.  Recent psychosocial stressors includes bell's palsy, and loss of her husband.   Will start quetiapine adjunctive treatment for anxiety and depression.  Discussed potential risk of drowsiness and metabolic side effect.  Will continue Lexapro to target depression and anxiety.   Plan 1. Continue lexapro  10 mg daily  (worsening in jitteriness at 15 mg) 2. Start Quetiapine 25 mg at night  3. Next appointment- 10/10 at 9:30 for 30 mins, in person   Past trials of medication: sertraline (more depressed, anxious), fluoxetine, buspirone,  mirtazapine,  Inderal, valium,    The patient demonstrates the following risk factors for suicide: Chronic risk factors for suicide include: psychiatric disorder of depression, anxiety. Acute risk factors for suicide include: unemployment. Protective factors for this patient include: positive social support, coping skills and hope for the future. Considering these factors, the overall suicide risk at this point appears to be low. Patient is appropriate for outpatient follow up.        Norman Clay, MD 02/05/2021, 3:52 PM

## 2021-02-04 ENCOUNTER — Emergency Department (HOSPITAL_COMMUNITY)
Admission: EM | Admit: 2021-02-04 | Discharge: 2021-02-04 | Disposition: A | Discharge status: Home / Self Care | Payer: Medicare Other | Attending: Emergency Medicine | Admitting: Emergency Medicine

## 2021-02-04 ENCOUNTER — Other Ambulatory Visit: Payer: Self-pay

## 2021-02-04 ENCOUNTER — Encounter (HOSPITAL_COMMUNITY): Payer: Self-pay | Admitting: *Deleted

## 2021-02-04 DIAGNOSIS — Z96651 Presence of right artificial knee joint: Secondary | ICD-10-CM | POA: Diagnosis not present

## 2021-02-04 DIAGNOSIS — R209 Unspecified disturbances of skin sensation: Secondary | ICD-10-CM | POA: Diagnosis not present

## 2021-02-04 DIAGNOSIS — G510 Bell's palsy: Secondary | ICD-10-CM

## 2021-02-04 DIAGNOSIS — E119 Type 2 diabetes mellitus without complications: Secondary | ICD-10-CM | POA: Diagnosis not present

## 2021-02-04 DIAGNOSIS — R29810 Facial weakness: Secondary | ICD-10-CM | POA: Diagnosis present

## 2021-02-04 MED ORDER — VALACYCLOVIR HCL 1 G PO TABS: 1000.0000 mg | ORAL_TABLET | Freq: Three times a day (TID) | ORAL | 0 refills | Status: DC

## 2021-02-04 MED ORDER — PREDNISONE 20 MG PO TABS: 20.0000 mg | ORAL_TABLET | Freq: Two times a day (BID) | ORAL | 0 refills | Status: AC

## 2021-02-04 NOTE — ED Triage Notes (Addendum)
Pt c/o numbness in her mouth and slurred speech since yesterday at 1300. Pt also c/o right eye watering more than normal, pain in bilateral ears, sinus pressure. Pt has right sided facial droop noted in triage. Denies fever.  Dr. Almyra Free called to triage for further evaluation.

## 2021-02-04 NOTE — Discharge Instructions (Addendum)
Use lubricating eyedrops on your right eye when awake.  He may need to use an eye patch when you sleep to prevent your eye from drying out at nighttime.  Take the medications as written, follow-up with your doctor within the week.  Return back to the ER if you have worsening symptoms new numbness weakness fevers or any additional concerns.

## 2021-02-04 NOTE — ED Provider Notes (Signed)
[facility] Provider Note   CSN: [id number] Arrival date & time: 02/04/21  0925     History Chief Complaint  Patient presents with   Numbness    Tamara Lam is a 70 y.o. female.  Patient presents to ER chief complaint of weakness to the right side of the face since yesterday around 1 PM.  She noted some slurred speech and weakness of the right eye.  Symptoms been ongoing since then.  Denies any weakness or numbness elsewhere such as in her upper extremities or lower extremities.  Denies fevers vomiting or cough.  She also complains of sinus congestion ongoing for about 2 weeks.  States she has a runny nose for about 2 weeks but denies fevers or cough.      Past Medical History:  Diagnosis Date   Abnormal CXR (chest x-ray)    Evaluated with Pulmonary- Dr. Luan Pulling- "pt. was told probably due to thin body frame"-saw no issues-PFT test normal.   Anxiety 1980's    Arthritis    osteoarthritis -knees, hands   Chronic constipation    tx. Linzess   Chronic nausea    Diabetes mellitus without complication (HCC)    [id number] level checked borderline x1, then further evaluated -Diabetes ruled out.   Glaucoma 2004   Dr. Venetia Maxon, laser to left eye Sept 2011, bilateral eye drops for this   Helicobacter pylori gastritis 04/2004   last EGD    Hematochezia    Hx of colonoscopy 08/2005   Dr. Gala Romney / hemorrhoids    Thyroid disease    thyroid goiter, nodules-no problems.   Weight loss     Patient Active Problem List   Diagnosis Date Noted   Abnormal urine 11/23/2020   Neck pain on right side 11/22/2020   Prediabetes 07/26/2020   Breast pain, right 07/26/2020   Acute right ankle pain 01/23/2020   MDD (major depressive disorder), recurrent, in partial remission ([place]) 08/22/2019   Skin lesion 07/04/2019   Intermittent palpitations 03/29/2019   Hemorrhoid 01/19/2019   Insomnia related to another mental disorder 08/14/2018   Depression, major, recurrent ([place])  06/15/2018   Abnormal CT scan, stomach 01/29/2018   Pruritus 04/14/2017   Back pain with right-sided radiculopathy 11/16/2016   Diverticulosis of colon without hemorrhage    Diabetes mellitus without complication ([place]) [id number]   History of colonic polyps 02/05/2015   OA (osteoarthritis) of knee 10/22/2014   Anxiety disorder 02/20/2014   Glaucoma 08/20/2013   Nontoxic multinodular goiter 08/01/2013   GERD (gastroesophageal reflux disease) 04/23/2013   Osteopenia 06/16/2012   Abnormal TSH 09/22/2011   Allergic rhinitis 04/17/2010   Abdominal pain 03/13/2010   Hyperlipidemia LDL goal <100 12/27/2009   CONSTIPATION, CHRONIC 09/24/2009    Past Surgical History:  Procedure Laterality Date   ABDOMINAL HYSTERECTOMY     arthoscopic surgery on right knee  2007   BIOPSY  03/03/2018   Procedure: BIOPSY;  Surgeon: Daneil Dolin, MD;  [facility];  Service: Endoscopy;;  gastric for h. pylori   CHOLECYSTECTOMY  1990's   COLONOSCOPY  2011   Dr. Gala Romney: anal canal/internal hemorrhoids, dimintuive rectosigmoid polyp (polypoid rectal mucosa), few sigmoid diverticula    COLONOSCOPY N/A 10/30/2015   Dr. Gala Romney: diverticulosis, hemorrhoids, surveillance in 2022    ESOPHAGOGASTRODUODENOSCOPY  2005   Dr. Gala Romney: diffuse submucosal petechial hemorrhage of te gastric mucosa, some noncompliance of gastric cavity, failure to insufflate fully, multiple gastric biopsies. gastric bx, h.pylori   ESOPHAGOGASTRODUODENOSCOPY N/A 03/03/2018  Dr. Gala Romney: scattered antral erosions/erythema no ulcer/mass. Bx: mild inflammation and No H.pylori   TOTAL KNEE ARTHROPLASTY Right 05/26/2016   Procedure: RIGHT TOTAL KNEE ARTHROPLASTY;  Surgeon: Gaynelle Arabian, MD;  Location: [facility];  Service: Orthopedics;  Laterality: Right;   VESICOVAGINAL FISTULA CLOSURE W/ TAH  1989     OB History   No obstetric history on file.     Family History  Problem Relation Age of Onset   Hypertension Mother    Glaucoma Mother     Diverticulosis Mother    Heart disease Mother    Lung cancer Father    Mental illness Sister    Depression Sister    Anxiety disorder Sister    Cancer Sister    Depression Sister    Anxiety disorder Sister    Depression Sister    Anxiety disorder Sister    Leukemia Brother        some form of leukemia   Colon polyps Son    Depression Daughter    Colon cancer Neg Hx     Social History   Tobacco Use   Smoking status: Never   Smokeless tobacco: Never   Tobacco comments:    Never smoked  Vaping Use   Vaping Use: Never used  Substance Use Topics   Alcohol use: No    Alcohol/week: 0.0 standard drinks   Drug use: No    Home Medications Prior to Admission medications   Medication Sig Start Date End Date Taking? Authorizing Provider  predniSONE (DELTASONE) 20 MG tablet Take 1 tablet (20 mg total) by mouth 2 (two) times daily with a meal for 5 days. 02/04/21 02/09/21 Yes Luna Fuse, MD  valACYclovir (VALTREX) 1000 MG tablet Take 1 tablet (1,000 mg total) by mouth 3 (three) times daily. 02/04/21  Yes Luna Fuse, MD  Calcium-Magnesium-Vitamin D (CALCIUM 1200+D3 PO) Take 1 tablet by mouth daily.    [provider]  escitalopram (LEXAPRO) 10 MG tablet Take 1 tablet (10 mg total) by mouth daily. 11/10/20 02/08/21  Norman Clay, MD  fluticasone Asencion Islam) 50 MCG/ACT nasal spray Use 2 sprays in each nostril once daily 02/20/20   Fayrene Helper, MD  LINZESS 290 MCG CAPS capsule TAKE 1 CAPSULE BY MOUTH  DAILY BEFORE BREAKFAST 05/31/20   Carlis Stable, NP  meloxicam (MOBIC) 7.5 MG tablet Take 1 tablet (7.5 mg total) by mouth daily. Patient not taking: Reported on 12/19/2020 11/22/20   Fayrene Helper, MD  montelukast (SINGULAIR) 10 MG tablet Take 10 mg by mouth as needed. Patient not taking: Reported on 12/19/2020    [provider]  Multiple Vitamins-Minerals (CENTRUM ADULTS) TABS Take 1 tablet by mouth daily.    [provider]  simvastatin (ZOCOR) 20 MG  tablet TAKE 1 TABLET BY MOUTH AT  BEDTIME FOR CHOLESTEROL 03/16/20   Fayrene Helper, MD  travoprost, benzalkonium, (TRAVATAN) 0.004 % ophthalmic solution Place 1 drop into both eyes at bedtime.    [provider]  vitamin C (ASCORBIC ACID) 500 MG tablet Take 500 mg by mouth daily.    [provider]    Allergies    Patient has no known allergies.  Review of Systems   Review of Systems  Constitutional:  Negative for fever.  HENT:  Negative for ear pain.   Eyes:  Negative for pain.  Respiratory:  Negative for cough.   Cardiovascular:  Negative for chest pain.  Gastrointestinal:  Negative for abdominal pain.  Genitourinary:  Negative for flank pain.  Musculoskeletal:  Negative for back pain.  Skin:  Negative for rash.  Neurological:  Negative for headaches.   Physical Exam Updated Vital Signs BP (!) 163/106 (BP Location: Right Arm)   Pulse 76   Temp 98.4 F (36.9 C) (Oral)   Resp 18   Ht '5\' 7"'$  (1.702 m)   Wt 59 kg   SpO2 100%   BMI 20.36 kg/m   Physical Exam Constitutional:      General: She is not in acute distress.    Appearance: Normal appearance.  HENT:     Head: Normocephalic.     Nose: Nose normal.  Eyes:     Extraocular Movements: Extraocular movements intact.  Cardiovascular:     Rate and Rhythm: Normal rate.  Pulmonary:     Effort: Pulmonary effort is normal.  Musculoskeletal:        General: Normal range of motion.     Cervical back: Normal range of motion.  Neurological:     Mental Status: She is alert.     Comments: Weakness of closing of the right eye and right-sided facial droop noted.  Decreased creases of the right forehead noted.  Otherwise 5/5 strength bilateral upper and lower extremities.  Normal gait noted.    ED Results / Procedures / Treatments   Labs (all labs ordered are listed, but only abnormal results are displayed) Labs Reviewed - No data to display  EKG None  Radiology No results  found.  Procedures Procedures   Medications Ordered in ED Medications - No data to display  ED Course  I have reviewed the triage vital signs and the nursing notes.  Pertinent labs & imaging results that were available during my care of the patient were reviewed by me and considered in my medical decision making (see chart for details).    MDM Rules/Calculators/A&P                           Patient presents clinically with symptoms of Bell's palsy.  Given prescription of antiviral and prednisone.  Advise follow-up with her doctor within the week.  Advised continued Sudafed use for sinus congestion.  Advising return for new numbness weakness or worsening symptoms or any additional concerns.   Final Clinical Impression(s) / ED Diagnoses Final diagnoses:  Bell's palsy    Rx / DC Orders ED Discharge Orders          Ordered    valACYclovir (VALTREX) 1000 MG tablet  3 times daily        02/04/21 1001    predniSONE (DELTASONE) 20 MG tablet  2 times daily with meals        02/04/21 1001             Luna Fuse, MD 02/04/21 1001

## 2021-02-05 ENCOUNTER — Ambulatory Visit (INDEPENDENT_AMBULATORY_CARE_PROVIDER_SITE_OTHER): Payer: Medicare Other | Admitting: Nurse Practitioner

## 2021-02-05 ENCOUNTER — Encounter: Payer: Self-pay | Admitting: Nurse Practitioner

## 2021-02-05 ENCOUNTER — Telehealth (INDEPENDENT_AMBULATORY_CARE_PROVIDER_SITE_OTHER): Payer: Medicare Other | Admitting: Psychiatry

## 2021-02-05 ENCOUNTER — Encounter: Payer: Self-pay | Admitting: Psychiatry

## 2021-02-05 VITALS — BP 121/85 | HR 74 | Temp 97.5°F | Ht 67.0 in | Wt 130.0 lb

## 2021-02-05 DIAGNOSIS — G510 Bell's palsy: Secondary | ICD-10-CM | POA: Diagnosis not present

## 2021-02-05 DIAGNOSIS — F3341 Major depressive disorder, recurrent, in partial remission: Secondary | ICD-10-CM

## 2021-02-05 DIAGNOSIS — M792 Neuralgia and neuritis, unspecified: Secondary | ICD-10-CM

## 2021-02-05 DIAGNOSIS — F419 Anxiety disorder, unspecified: Secondary | ICD-10-CM | POA: Diagnosis not present

## 2021-02-05 MED ORDER — ESCITALOPRAM OXALATE 10 MG PO TABS: 10.0000 mg | ORAL_TABLET | Freq: Every day | ORAL | 1 refills | Status: DC

## 2021-02-05 MED ORDER — GABAPENTIN 100 MG PO CAPS: 100.0000 mg | ORAL_CAPSULE | Freq: Three times a day (TID) | ORAL | 1 refills | Status: DC

## 2021-02-05 MED ORDER — QUETIAPINE FUMARATE 25 MG PO TABS: 25.0000 mg | ORAL_TABLET | Freq: Every evening | ORAL | 2 refills | Status: DC | PRN

## 2021-02-05 MED ORDER — ARTIFICIAL TEARS OPHTHALMIC OINT: TOPICAL_OINTMENT | Freq: Every evening | OPHTHALMIC | 1 refills | Status: DC | PRN

## 2021-02-05 NOTE — Progress Notes (Signed)
Acute Office Visit  Subjective:    Patient ID: Tamara Lam, female    DOB: 06-11-1951, 70 y.o.   MRN: [id number]  Chief Complaint  Patient presents with   Follow-up    Bells palsy, started Sunday afternoon, L side of face. R eye and nose is draining a lot. Having a lot of mouth and ear pain on L side.    HPI Patient is in today for follow-up for ED visit where she was diagnosed with Bell's Palsy.  Past Medical History:  Diagnosis Date   Abnormal CXR (chest x-ray)    Evaluated with Pulmonary- Dr. Luan Pulling- "pt. was told probably due to thin body frame"-saw no issues-PFT test normal.   Anxiety 1980's    Arthritis    osteoarthritis -knees, hands   Chronic constipation    tx. Linzess   Chronic nausea    Diabetes mellitus without complication (HCC)    [id number] level checked borderline x1, then further evaluated -Diabetes ruled out.   Glaucoma 2004   Dr. Venetia Maxon, laser to left eye Sept 2011, bilateral eye drops for this   Helicobacter pylori gastritis 04/2004   last EGD    Hematochezia    Hx of colonoscopy 08/2005   Dr. Gala Romney / hemorrhoids    Thyroid disease    thyroid goiter, nodules-no problems.   Weight loss     Past Surgical History:  Procedure Laterality Date   ABDOMINAL HYSTERECTOMY     arthoscopic surgery on right knee  2007   BIOPSY  03/03/2018   Procedure: BIOPSY;  Surgeon: Daneil Dolin, MD;  [facility];  Service: Endoscopy;;  gastric for h. pylori   CHOLECYSTECTOMY  1990's   COLONOSCOPY  2011   Dr. Gala Romney: anal canal/internal hemorrhoids, dimintuive rectosigmoid polyp (polypoid rectal mucosa), few sigmoid diverticula    COLONOSCOPY N/A 10/30/2015   Dr. Gala Romney: diverticulosis, hemorrhoids, surveillance in 2022    ESOPHAGOGASTRODUODENOSCOPY  2005   Dr. Gala Romney: diffuse submucosal petechial hemorrhage of te gastric mucosa, some noncompliance of gastric cavity, failure to insufflate fully, multiple gastric biopsies. gastric bx, h.pylori    ESOPHAGOGASTRODUODENOSCOPY N/A 03/03/2018   Dr. Gala Romney: scattered antral erosions/erythema no ulcer/mass. Bx: mild inflammation and No H.pylori   TOTAL KNEE ARTHROPLASTY Right 05/26/2016   Procedure: RIGHT TOTAL KNEE ARTHROPLASTY;  Surgeon: Gaynelle Arabian, MD;  [facility];  Service: Orthopedics;  Laterality: Right;   VESICOVAGINAL FISTULA CLOSURE W/ TAH  1989    Family History  Problem Relation Age of Onset   Hypertension Mother    Glaucoma Mother    Diverticulosis Mother    Heart disease Mother    Lung cancer Father    Mental illness Sister    Depression Sister    Anxiety disorder Sister    Cancer Sister    Depression Sister    Anxiety disorder Sister    Depression Sister    Anxiety disorder Sister    Leukemia Brother        some form of leukemia   Colon polyps Son    Depression Daughter    Colon cancer Neg Hx     Social History   Socioeconomic History   Marital status: Widowed    Spouse name: Not on file   Number of children: Not on file   Years of education: Not on file   Highest education level: Not on file  Occupational History   Occupation: homemaker     Employer: UNEMPLOYED  Tobacco Use   Smoking status:  Never   Smokeless tobacco: Never   Tobacco comments:    Never smoked  Vaping Use   Vaping Use: Never used  Substance and Sexual Activity   Alcohol use: No    Alcohol/week: 0.0 standard drinks   Drug use: No   Sexual activity: Not Currently  Other Topics Concern   Not on file  Social History Narrative   Not on file   Social Determinants of Health   Financial Resource Strain: Low Risk    Difficulty of Paying Living Expenses: Not hard at all  Food Insecurity: No Food Insecurity   Worried About Charity fundraiser in the Last Year: Never true   [place] in the Last Year: Never true  Transportation Needs: No Transportation Needs   Lack of Transportation (Medical): No   Lack of Transportation (Non-Medical): No  Physical Activity:  Sufficiently Active   Days of Exercise per Week: 3 days   Minutes of Exercise per Session: 50 min  Stress: No Stress Concern Present   Feeling of Stress : Not at all  Social Connections: Moderately Isolated   Frequency of Communication with Friends and Family: More than three times a week   Frequency of Social Gatherings with Friends and Family: More than three times a week   Attends Religious Services: More than 4 times per year   Active Member of [employer] or Organizations: No   Attends Archivist Meetings: Never   Marital Status: Widowed  Human resources officer Violence: Not At Risk   Fear of Current or Ex-Partner: No   Emotionally Abused: No   Physically Abused: No   Sexually Abused: No    Outpatient Medications Prior to Visit  Medication Sig Dispense Refill   Calcium-Magnesium-Vitamin D (CALCIUM 1200+D3 PO) Take 1 tablet by mouth daily.     [START ON 02/09/2021] escitalopram (LEXAPRO) 10 MG tablet Take 1 tablet (10 mg total) by mouth daily. 30 tablet 1   fluticasone (FLONASE) 50 MCG/ACT nasal spray Use 2 sprays in each nostril once daily 16 g 3   LINZESS 290 MCG CAPS capsule TAKE 1 CAPSULE BY MOUTH  DAILY BEFORE BREAKFAST 90 capsule 3   meloxicam (MOBIC) 7.5 MG tablet Take 1 tablet (7.5 mg total) by mouth daily. 10 tablet 0   montelukast (SINGULAIR) 10 MG tablet Take 10 mg by mouth as needed.     Multiple Vitamins-Minerals (CENTRUM ADULTS) TABS Take 1 tablet by mouth daily.     predniSONE (DELTASONE) 20 MG tablet Take 1 tablet (20 mg total) by mouth 2 (two) times daily with a meal for 5 days. 10 tablet 0   QUEtiapine (SEROQUEL) 25 MG tablet Take 1 tablet (25 mg total) by mouth at bedtime as needed. 30 tablet 2   simvastatin (ZOCOR) 20 MG tablet TAKE 1 TABLET BY MOUTH AT  BEDTIME FOR CHOLESTEROL 90 tablet 3   travoprost, benzalkonium, (TRAVATAN) 0.004 % ophthalmic solution Place 1 drop into both eyes at bedtime.     valACYclovir (VALTREX) 1000 MG tablet Take 1 tablet (1,000 mg  total) by mouth 3 (three) times daily. 21 tablet 0   vitamin C (ASCORBIC ACID) 500 MG tablet Take 500 mg by mouth daily.     No facility-administered medications prior to visit.    No Known Allergies  Review of Systems  Constitutional: Negative.   HENT:  Positive for ear pain.        Facial pain  Neurological:  Positive for facial asymmetry.  Objective:    Physical Exam Constitutional:      Appearance: Normal appearance.  HENT:     Right Ear: Tympanic membrane, ear canal and external ear normal.     Left Ear: Tympanic membrane, ear canal and external ear normal.     Nose: Nose normal.  Eyes:     Comments: Right eye has tears draining; unable to close right eye  Cardiovascular:     Rate and Rhythm: Normal rate and regular rhythm.     Pulses: Normal pulses.     Heart sounds: Normal heart sounds.  Pulmonary:     Effort: Pulmonary effort is normal.     Breath sounds: Normal breath sounds.  Neurological:     Mental Status: She is alert.     Cranial Nerves: Cranial nerve deficit present.     Sensory: No sensory deficit.    BP 121/85 (BP Location: Left Arm, Patient Position: Sitting, Cuff Size: Large)   Pulse 74   Temp (!) 97.5 F (36.4 C) (Temporal)   Ht '5\' 7"'$  (1.702 m)   Wt 130 lb (59 kg)   SpO2 95%   BMI 20.36 kg/m  Wt Readings from Last 3 Encounters:  02/05/21 130 lb (59 kg)  02/04/21 130 lb (59 kg)  12/19/20 132 lb (59.9 kg)    Health Maintenance Due  Topic Date Due   Zoster Vaccines- Shingrix (1 of 2) Never done   URINE MICROALBUMIN  11/12/2016   INFLUENZA VACCINE  01/28/2021    There are no preventive care reminders to display for this patient.   Lab Results  Component Value Date   TSH 0.998 09/04/2020   Lab Results  Component Value Date   WBC 6.0 07/26/2020   HGB 13.5 07/26/2020   HCT 41.8 07/26/2020   MCV 87 07/26/2020   PLT 194 07/26/2020   Lab Results  Component Value Date   NA 144 07/26/2020   K 4.2 07/26/2020   CO2 29  07/26/2020   GLUCOSE 83 07/26/2020   BUN 14 07/26/2020   CREATININE 0.77 07/26/2020   BILITOT 0.4 07/26/2020   ALKPHOS 50 07/26/2020   AST 22 07/26/2020   ALT 17 07/26/2020   PROT 7.3 07/26/2020   ALBUMIN 4.5 07/26/2020   CALCIUM 10.0 07/26/2020   ANIONGAP 6 05/28/2016   Lab Results  Component Value Date   CHOL 182 07/26/2020   Lab Results  Component Value Date   HDL 59 07/26/2020   Lab Results  Component Value Date   LDLCALC 106 (H) 07/26/2020   Lab Results  Component Value Date   TRIG 95 07/26/2020   Lab Results  Component Value Date   CHOLHDL 2.8 02/15/2020   Lab Results  Component Value Date   HGBA1C 5.9 09/13/2020       Assessment & Plan:   Problem List Items Addressed This Visit       Nervous and Auditory   Bell's palsy    -was prescribed prednisone and valcyclovir -Rx. lacrilube for eye care at bedtime -recommend medical tape for eye care at night -if no improvement in facial pain, may consider neuro consult         Meds ordered this encounter  Medications   artificial tears (LACRILUBE) OINT ophthalmic ointment    Sig: Place into the right eye at bedtime as needed for dry eyes.    Dispense:  3.5 g    Refill:  [place], NP

## 2021-02-05 NOTE — Assessment & Plan Note (Signed)
-  was prescribed prednisone and valcyclovir -Rx. lacrilube for eye care at bedtime -recommend medical tape for eye care at night -if no improvement in facial pain, may consider neuro consult

## 2021-02-05 NOTE — Patient Instructions (Addendum)
1. Continue lexapro  10 mg daily   2. Start Quetiapine 25 mg at night  3. Next appointment- 10/10 at 9:30  The next visit will be in person visit. Please arrive 15 mins before the scheduled time.   [facility]  Address: [address], [place], [place] [address]

## 2021-02-05 NOTE — Addendum Note (Signed)
Addended by: Demetrius Revel on: 02/05/2021 03:21 PM   Modules accepted: Orders

## 2021-02-05 NOTE — Patient Instructions (Addendum)
Eye care to prevent corneal injury -- All patients with Bell's palsy should be assessed for the completeness of eyelid closure. For those with incomplete closure (ie, sclera remains visible when the patient is asked to close the eye), meticulous eye care is required to prevent corneal injury [2]. In addition to physical exposure due to eyelid weakness, the cornea is at increased risk for dryness and abrasion due to reduced tear production from the lacrimal gland, which is also innervated by the facial nerve.  Eye care includes the following: ?Waking hours - Patients should use artificial tear drops (liquid or gel) four times daily and up to hourly if needed. Gel formulations may cause transient blurry vision when applied. Artificial tears are available over the counter. Formulations with preservatives are safe for use four times a day, but preservative-free formulations are probably safer for more frequent application. (See "Dry eye disease", section on 'Artificial tears'.)  Protective glasses or goggles can be worn to physically protect the eye from external trauma. Some patients may prefer to tape the eyelid closed during the day to prevent exposure, using the same procedure as described for sleep. Patches are generally not recommended because there is a tendency for the eyeto open under the patch and expose the cornea.  Patients with refractory eye dryness and exposure symptoms should be referred to ophthalmology for discussion of additional options such as a scleral lens, adhesive moisture chambers, temporary-suture tarsorrhaphy, and gold or platinum eyelid weighting [13]. (See 'Eyelid weights and tarsorrhaphy' below.)  During sleep - Overnight and during naps, an ointment formulation of artificial tears should be applied to further protect the eye when it is most vulnerable. In addition, the eye can be carefully taped shut using a medical-grade waterproof transparent dressing or tape [13]. The eye  should be examined to make sure the eyelid remains completely closed under the dressing. Patching without taping is not generally recommended because the cornea remains exposed under the patch.

## 2021-02-13 ENCOUNTER — Other Ambulatory Visit: Payer: Self-pay

## 2021-02-13 ENCOUNTER — Encounter: Payer: Self-pay | Admitting: Family Medicine

## 2021-02-13 ENCOUNTER — Ambulatory Visit (INDEPENDENT_AMBULATORY_CARE_PROVIDER_SITE_OTHER): Payer: Medicare Other | Admitting: Family Medicine

## 2021-02-13 VITALS — BP 125/78 | HR 72 | Resp 15 | Ht 67.0 in | Wt 129.0 lb

## 2021-02-13 DIAGNOSIS — E785 Hyperlipidemia, unspecified: Secondary | ICD-10-CM

## 2021-02-13 DIAGNOSIS — H6692 Otitis media, unspecified, left ear: Secondary | ICD-10-CM | POA: Diagnosis not present

## 2021-02-13 DIAGNOSIS — G510 Bell's palsy: Secondary | ICD-10-CM

## 2021-02-13 MED ORDER — PREDNISONE 10 MG PO TABS: ORAL_TABLET | ORAL | 0 refills | Status: DC

## 2021-02-13 MED ORDER — SULFAMETHOXAZOLE-TRIMETHOPRIM 800-160 MG PO TABS: 1.0000 | ORAL_TABLET | Freq: Two times a day (BID) | ORAL | 0 refills | Status: DC

## 2021-02-13 NOTE — Patient Instructions (Signed)
F/U in 6 weeks , re evaluate Bells' palsy, call if you need me sooner  Please take an additional 1 week of prednisone as prescribed  For right ear infection, 1 week of septra is prescribed, if you continue to have ear discomfort , please call I will refer you to ENT  Thanks for choosing [facility], we consider it a privelige to serve you.

## 2021-02-17 ENCOUNTER — Encounter: Payer: Self-pay | Admitting: Family Medicine

## 2021-02-17 NOTE — Assessment & Plan Note (Signed)
Septra prescribed, re eval in 6 weeks

## 2021-02-17 NOTE — Assessment & Plan Note (Signed)
Persistent pain , an additional course of prednisone is prescribed short term, may need to add gabapentin, will continue to follow

## 2021-02-17 NOTE — Progress Notes (Signed)
   KAHO KUSSMAN     MRN: [id number]      DOB: 04-May-1951   HPI Ms. Lombardozzi is here for follow up aof Ed visit on 8/8 when she was diagnosed with Bell's Palsy. C/o right facial weakness,  C/o left ear pain and pressure has ahd this complaint for some weeks and no dx or relief ROS Denies recent fever or chills.  Denies chest congestion, productive cough or wheezing. Denies chest pains, palpitations and leg swelling Denies abdominal pain, nausea, vomiting,diarrhea or constipation.   Denies dysuria, frequency, hesitancy or incontinence. Denies joint pain, swelling and limitation in mobility.  Denies depression, anxiety or insomnia. Denies skin break down or rash.   PE  BP 125/78   Pulse 72   Resp 15   Ht '5\' 7"'$  (1.702 m)   Wt 129 lb (58.5 kg)   SpO2 97%   BMI 20.20 kg/m   Patient alert and oriented and in no cardiopulmonary distress.  HEENT: facial asymmetry, right paralysis,     Neck supple .Left TM dull and erythematous,  reduced light reflex, Right M clear and normal  Chest: Clear to auscultation bilaterally.  CVS: S1, S2 no murmurs, no S3.Regular rate.  ABD: Soft non tender.   Ext: No edema  MS: Adequate ROM spine, shoulders, hips and knees.  Skin: Intact, no ulcerations or rash noted.  Psych: Good eye contact, normal affect. Memory intact not anxious or depressed appearing.  CNS: CN 2-12 intact, power,  normal throughout.no focal deficits noted.   Assessment & Plan  Bell's palsy Persistent pain , an additional course of prednisone is prescribed short term, may need to add gabapentin, will continue to follow   Left otitis media Septra prescribed, re eval in 6 weeks  Hyperlipidemia LDL goal <100 Hyperlipidemia:Low fat diet discussed and encouraged.   Lipid Panel  Lab Results  Component Value Date   CHOL 182 07/26/2020   HDL 59 07/26/2020   LDLCALC 106 (H) 07/26/2020   TRIG 95 07/26/2020   CHOLHDL 2.8 02/15/2020   Needs to reduce fried and fatty  food

## 2021-02-17 NOTE — Assessment & Plan Note (Signed)
Hyperlipidemia:Low fat diet discussed and encouraged.   Lipid Panel  Lab Results  Component Value Date   CHOL 182 07/26/2020   HDL 59 07/26/2020   LDLCALC 106 (H) 07/26/2020   TRIG 95 07/26/2020   CHOLHDL 2.8 02/15/2020   Needs to reduce fried and fatty food

## 2021-03-27 ENCOUNTER — Ambulatory Visit (HOSPITAL_COMMUNITY)
Admission: RE | Admit: 2021-03-27 | Discharge: 2021-03-27 | Disposition: A | Discharge status: Home / Self Care | Payer: Medicare Other | Source: Ambulatory Visit | Attending: Family Medicine | Admitting: Family Medicine

## 2021-03-27 ENCOUNTER — Ambulatory Visit (INDEPENDENT_AMBULATORY_CARE_PROVIDER_SITE_OTHER): Payer: Medicare Other | Admitting: Family Medicine

## 2021-03-27 ENCOUNTER — Encounter: Payer: Self-pay | Admitting: Family Medicine

## 2021-03-27 ENCOUNTER — Other Ambulatory Visit: Payer: Self-pay

## 2021-03-27 VITALS — BP 128/74 | HR 75 | Resp 16 | Ht 67.0 in | Wt 131.8 lb

## 2021-03-27 DIAGNOSIS — H409 Unspecified glaucoma: Secondary | ICD-10-CM

## 2021-03-27 DIAGNOSIS — G510 Bell's palsy: Secondary | ICD-10-CM

## 2021-03-27 DIAGNOSIS — F332 Major depressive disorder, recurrent severe without psychotic features: Secondary | ICD-10-CM

## 2021-03-27 DIAGNOSIS — E785 Hyperlipidemia, unspecified: Secondary | ICD-10-CM | POA: Diagnosis not present

## 2021-03-27 DIAGNOSIS — M542 Cervicalgia: Secondary | ICD-10-CM | POA: Insufficient documentation

## 2021-03-27 DIAGNOSIS — G8929 Other chronic pain: Secondary | ICD-10-CM

## 2021-03-27 DIAGNOSIS — Z23 Encounter for immunization: Secondary | ICD-10-CM | POA: Diagnosis not present

## 2021-03-27 DIAGNOSIS — R519 Headache, unspecified: Secondary | ICD-10-CM | POA: Diagnosis not present

## 2021-03-27 DIAGNOSIS — E559 Vitamin D deficiency, unspecified: Secondary | ICD-10-CM

## 2021-03-27 DIAGNOSIS — E119 Type 2 diabetes mellitus without complications: Secondary | ICD-10-CM

## 2021-03-27 DIAGNOSIS — F419 Anxiety disorder, unspecified: Secondary | ICD-10-CM

## 2021-03-27 DIAGNOSIS — J302 Other seasonal allergic rhinitis: Secondary | ICD-10-CM | POA: Diagnosis not present

## 2021-03-27 DIAGNOSIS — R7303 Prediabetes: Secondary | ICD-10-CM

## 2021-03-27 NOTE — Patient Instructions (Addendum)
Annual exam in office Jan 28 or after, cancel sooner appointment, call if you need me beforre  Flu vaccine today  Please add hBA1C to labs ordered   May 26, needs labs in next 1 week  X ray of neck is ordered , you may get this today for right posterior head pain   Thanks for choosing [facility], we consider it a privelige to serve you.

## 2021-03-29 ENCOUNTER — Encounter: Payer: Self-pay | Admitting: Family Medicine

## 2021-03-29 DIAGNOSIS — R519 Headache, unspecified: Secondary | ICD-10-CM | POA: Insufficient documentation

## 2021-03-29 NOTE — Assessment & Plan Note (Signed)
Controlled, no change in medication  

## 2021-03-29 NOTE — Assessment & Plan Note (Signed)
Patient educated about the importance of limiting  Carbohydrate intake , the need to commit to daily physical activity for a minimum of 30 minutes , and to commit weight loss. The fact that changes in all these areas will reduce or eliminate all together the development of diabetes is stressed.   Diabetic Labs Latest Ref Rng & Units 09/13/2020 07/26/2020 02/15/2020 08/23/2019 03/23/2019  HbA1c 0.0 - 7.0 % 5.9 6.0(H) - 5.8(H) -  Microalbumin Not estab mg/dL - - - - -  Micro/Creat Ratio <30 mcg/mg creat - - - - -  Chol 100 - 199 mg/dL - 182 188 155 181  HDL >39 mg/dL - 59 66 66 64  Calc LDL 0 - 99 mg/dL - 106(H) 109(H) 75 95  Triglycerides 0 - 149 mg/dL - 95 72 73 124  Creatinine 0.57 - 1.00 mg/dL - 0.77 0.80 0.75 0.66   BP/Weight 03/27/2021 02/13/2021 02/05/2021 02/04/2021 12/19/2020 11/22/2020 2/94/7654  Systolic BP 650 354 656 812 751 700 174  Diastolic BP 74 78 85 944 79 82 76  Wt. (Lbs) 131.8 129 130 130 132 130 131.4  BMI 20.64 20.2 20.36 20.36 20.67 20.36 20.58   Foot/eye exam completion dates Latest Ref Rng & Units 11/13/2015 06/20/2014  Eye Exam No Retinopathy - -  Foot Form Completion - Done Done

## 2021-03-29 NOTE — Assessment & Plan Note (Signed)
Controlled, no change in medication Mnaged by Psych

## 2021-03-29 NOTE — Assessment & Plan Note (Signed)
Minor rsidual weakness present , no facial assymetry

## 2021-03-29 NOTE — Assessment & Plan Note (Signed)
Followed closely by ophthalmology, stable

## 2021-03-29 NOTE — Progress Notes (Signed)
Tamara Lam     MRN: [id number]      DOB: [birth year]   HPI Tamara Lam is here for follow up  of Bell's palsy, and re-evaluation of chronic medical conditions, medication management and review of any available recent lab and radiology data.  Preventive health is updated, specifically  Cancer screening and Immunization.   Reports 90 % improved, still notes slight weakness when chewing bu happy that no visible evidence of weakness. The PT denies any adverse reactions to current medications since the last visit.  C/o tender localized area on right posterior scalp, no direct trauma, only present with direct pressure  ROS Denies recent fever or chills. Denies sinus pressure, nasal congestion, ear pain or sore throat. Denies chest congestion, productive cough or wheezing. Denies chest pains, palpitations and leg swelling Denies abdominal pain, nausea, vomiting,diarrhea or constipation.   Denies dysuria, frequency, hesitancy or incontinence. Denies joint pain, swelling and limitation in mobility. Denies  seizures, numbness, or tingling. Denies uncontrolled depression, anxiety or insomnia. Denies skin break down or rash.   PE  BP 128/74   Pulse 75   Resp 16   Ht 5\' 7"  (1.702 m)   Wt 131 lb 12.8 oz (59.8 kg)   SpO2 96%   BMI 20.64 kg/m   Patient alert and oriented and in no cardiopulmonary distress.  HEENT: No facial asymmetry, EOMI,     Neck supple Localized tenderness with direct pressure in area of concern, no abnormality of scalp  Chest: Clear to auscultation bilaterally.  CVS: S1, S2 no murmurs, no S3.Regular rate.  ABD: Soft non tender.   Ext: No edema  MS: Adequate ROM spine, shoulders, hips and knees.  Skin: Intact, no ulcerations or rash noted.  Psych: Good eye contact, normal affect. Memory intact not anxious or depressed appearing.  CNS: CN 2-12 intact, power,  normal throughout.no focal deficits noted.   Assessment & Plan  Bell's palsy Minor rsidual  weakness present , no facial assymetry   Depression, major, recurrent (HCC) Controlled, no change in medication Mnaged by Psych  Hyperlipidemia LDL goal <100 Hyperlipidemia:Low fat diet discussed and encouraged.   Lipid Panel  Lab Results  Component Value Date   CHOL 182 07/26/2020   HDL 59 07/26/2020   LDLCALC 106 (H) 07/26/2020   TRIG 95 07/26/2020   CHOLHDL 2.8 02/15/2020     Updated lab needed at/ before next visit.   Glaucoma Followed closely by ophthalmology, stable  Allergic rhinitis Controlled, no change in medication   Anxiety disorder Controlled, no change in medication   Scalp tenderness Localized to posterior right scalp, no abn of skin, reassured no pathology on exam, monitor only  Neck pain on right side X ray of C spine to further eval, may be causing referred pain to posterior right scalp  Prediabetes Patient educated about the importance of limiting  Carbohydrate intake , the need to commit to daily physical activity for a minimum of 30 minutes , and to commit weight loss. The fact that changes in all these areas will reduce or eliminate all together the development of diabetes is stressed.   Diabetic Labs Latest Ref Rng & Units 09/13/2020 07/26/2020 02/15/2020 08/23/2019 03/23/2019  HbA1c 0.0 - 7.0 % 5.9 6.0(H) - 5.8(H) -  Microalbumin Not estab mg/dL - - - - -  Micro/Creat Ratio <30 mcg/mg creat - - - - -  Chol 100 - 199 mg/dL - 182 188 155 181  HDL >39 mg/dL - 59 66  66 64  Calc LDL 0 - 99 mg/dL - 106(H) 109(H) 75 95  Triglycerides 0 - 149 mg/dL - 95 72 73 124  Creatinine 0.57 - 1.00 mg/dL - 0.77 0.80 0.75 0.66   BP/Weight 03/27/2021 02/13/2021 02/05/2021 02/04/2021 12/19/2020 11/22/2020 8/48/3507  Systolic BP 573 225 672 091 980 221 798  Diastolic BP 74 78 85 102 79 82 76  Wt. (Lbs) 131.8 129 130 130 132 130 131.4  BMI 20.64 20.2 20.36 20.36 20.67 20.36 20.58   Foot/eye exam completion dates Latest Ref Rng & Units 11/13/2015 06/20/2014  Eye Exam No  Retinopathy - -  Foot Form Completion - Done Done

## 2021-03-29 NOTE — Assessment & Plan Note (Signed)
Hyperlipidemia:Low fat diet discussed and encouraged.   Lipid Panel  Lab Results  Component Value Date   CHOL 182 07/26/2020   HDL 59 07/26/2020   LDLCALC 106 (H) 07/26/2020   TRIG 95 07/26/2020   CHOLHDL 2.8 02/15/2020     Updated lab needed at/ before next visit.

## 2021-03-29 NOTE — Assessment & Plan Note (Signed)
Localized to posterior right scalp, no abn of skin, reassured no pathology on exam, monitor only

## 2021-03-29 NOTE — Assessment & Plan Note (Addendum)
X ray of C spine to further eval, may be causing referred pain to posterior right scalp

## 2021-04-01 DIAGNOSIS — H401131 Primary open-angle glaucoma, bilateral, mild stage: Secondary | ICD-10-CM | POA: Diagnosis not present

## 2021-04-01 DIAGNOSIS — H25813 Combined forms of age-related cataract, bilateral: Secondary | ICD-10-CM | POA: Diagnosis not present

## 2021-04-03 NOTE — Progress Notes (Signed)
Virtual Visit via Video Note  I connected with Tamara Lam on 04/08/21 at  9:30 AM EDT by a video enabled telemedicine application and verified that I am speaking with the correct person using two identifiers.  Location: Patient: car Provider: office Persons participated in the visit- patient, provider    I discussed the limitations of evaluation and management by telemedicine and the availability of in person appointments. The patient expressed understanding and agreed to proceed.   I discussed the assessment and treatment plan with the patient. The patient was provided an opportunity to ask questions and all were answered. The patient agreed with the plan and demonstrated an understanding of the instructions.   The patient was advised to call back or seek an in-person evaluation if the symptoms worsen or if the condition fails to improve as anticipated.  I provided 18 minutes of non-face-to-face time during this encounter.   Tamara Clay, MD    [facility] MD/PA/NP OP Progress Note  04/08/2021 10:01 AM Tamara Lam  MRN:  [id number]  Chief Complaint:  Chief Complaint   Follow-up; Depression    HPI:  This is a follow-up appointment for depression and anxiety.  She states that she has been doing good.  She believes her anxiety has been better.  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  Her daughter has been doing foster care.  This keeps her occupied, and she does not have time to feel depressed.  She goes to church every October 14, 2022.  It has been going good.  She has been taking quetiapine few times per week.  She tends to take it when she has anxiety.  She agrees to take it consistently.  She has good sleep.  She denies change in weight or appetite.  She has fair energy.  She denies SI.  She feels anxious and tense at times.  She had a few panic attacks since the last visit.   Wt Readings from Last 3 Encounters:  03/27/21 131 lb 12.8 oz (59.8 kg)  02/13/21 129 lb (58.5 kg)  02/05/21  130 lb (59 kg)     Daily routine: [employer] three times a week, takes a walk 2-3 times/week, church on Sundays, greeting/welcoming people Employment: unemployed  Household: son, age 70, her daughter lives nearby Marital status: widowed, her husband deceased in 10/14/10 Children: 3  She has four sister, one brother  Visit Diagnosis:    ICD-10-CM   1. MDD (major depressive disorder), recurrent, in partial remission (HCC)  F33.41 escitalopram (LEXAPRO) 10 MG tablet    2. Anxiety disorder, unspecified type  F41.9 escitalopram (LEXAPRO) 10 MG tablet      Past Psychiatric History: Please see initial evaluation for full details. I have reviewed the history. No updates at this time.     Past Medical History:  Past Medical History:  Diagnosis Date   Abnormal CXR (chest x-ray)    Evaluated with Pulmonary- Dr. Luan Pulling- "pt. was told probably due to thin body frame"-saw no issues-PFT test normal.   Anxiety 1980's    Arthritis    osteoarthritis -knees, hands   Chronic constipation    tx. Linzess   Chronic nausea    Diabetes mellitus without complication (HCC)    S9Q level checked borderline x1, then further evaluated -Diabetes ruled out.   Glaucoma 10-14-2002   Dr. Venetia Maxon, laser to left eye Sept 2011, bilateral eye drops for this   Helicobacter pylori gastritis 04/2004   last EGD    Hematochezia  Hx of colonoscopy 08/2005   Dr. Gala Romney / hemorrhoids    Thyroid disease    thyroid goiter, nodules-no problems.   Weight loss     Past Surgical History:  Procedure Laterality Date   ABDOMINAL HYSTERECTOMY     arthoscopic surgery on right knee  2007   BIOPSY  03/03/2018   Procedure: BIOPSY;  Surgeon: Daneil Dolin, MD;  Location: [facility];  Service: Endoscopy;;  gastric for h. pylori   CHOLECYSTECTOMY  1990's   COLONOSCOPY  2011   Dr. Gala Romney: anal canal/internal hemorrhoids, dimintuive rectosigmoid polyp (polypoid rectal mucosa), few sigmoid diverticula    COLONOSCOPY N/A 10/30/2015   Dr.  Gala Romney: diverticulosis, hemorrhoids, surveillance in 2022    ESOPHAGOGASTRODUODENOSCOPY  2005   Dr. Gala Romney: diffuse submucosal petechial hemorrhage of te gastric mucosa, some noncompliance of gastric cavity, failure to insufflate fully, multiple gastric biopsies. gastric bx, h.pylori   ESOPHAGOGASTRODUODENOSCOPY N/A 03/03/2018   Dr. Gala Romney: scattered antral erosions/erythema no ulcer/mass. Bx: mild inflammation and No H.pylori   TOTAL KNEE ARTHROPLASTY Right 05/26/2016   Procedure: RIGHT TOTAL KNEE ARTHROPLASTY;  Surgeon: Gaynelle Arabian, MD;  Location: [facility];  Service: Orthopedics;  Laterality: Right;   VESICOVAGINAL FISTULA CLOSURE W/ TAH  1989    Family Psychiatric History: Please see initial evaluation for full details. I have reviewed the history. No updates at this time.     Family History:  Family History  Problem Relation Age of Onset   Hypertension Mother    Glaucoma Mother    Diverticulosis Mother    Heart disease Mother    Lung cancer Father    Mental illness Sister    Depression Sister    Anxiety disorder Sister    Cancer Sister    Depression Sister    Anxiety disorder Sister    Depression Sister    Anxiety disorder Sister    Leukemia Brother        some form of leukemia   Colon polyps Son    Depression Daughter    Colon cancer Neg Hx     Social History:  Social History   Socioeconomic History   Marital status: Widowed    Spouse name: Not on file   Number of children: Not on file   Years of education: Not on file   Highest education level: Not on file  Occupational History   Occupation: homemaker     Employer: UNEMPLOYED  Tobacco Use   Smoking status: Never   Smokeless tobacco: Never   Tobacco comments:    Never smoked  Vaping Use   Vaping Use: Never used  Substance and Sexual Activity   Alcohol use: No    Alcohol/week: 0.0 standard drinks   Drug use: No   Sexual activity: Not Currently  Other Topics Concern   Not on file  Social History Narrative    Not on file   Social Determinants of Health   Financial Resource Strain: Low Risk    Difficulty of Paying Living Expenses: Not hard at all  Food Insecurity: No Food Insecurity   Worried About Charity fundraiser in the Last Year: Never true   [place] in the Last Year: Never true  Transportation Needs: No Transportation Needs   Lack of Transportation (Medical): No   Lack of Transportation (Non-Medical): No  Physical Activity: Sufficiently Active   Days of Exercise per Week: 3 days   Minutes of Exercise per Session: 50 min  Stress: No Stress Concern  Present   Feeling of Stress : Not at all  Social Connections: Moderately Isolated   Frequency of Communication with Friends and Family: More than three times a week   Frequency of Social Gatherings with Friends and Family: More than three times a week   Attends Religious Services: More than 4 times per year   Active Member of [employer] or Organizations: No   Attends Archivist Meetings: Never   Marital Status: Widowed    Allergies: No Known Allergies  Metabolic Disorder Labs: Lab Results  Component Value Date   HGBA1C 5.9 09/13/2020   MPG 114 05/05/2016   No results found for: PROLACTIN Lab Results  Component Value Date   CHOL 182 07/26/2020   TRIG 95 07/26/2020   HDL 59 07/26/2020   CHOLHDL 2.8 02/15/2020   VLDL 13 05/05/2016   LDLCALC 106 (H) 07/26/2020   LDLCALC 109 (H) 02/15/2020   Lab Results  Component Value Date   TSH 0.998 09/04/2020   TSH 1.00 09/04/2020    Therapeutic Level Labs: No results found for: LITHIUM No results found for: VALPROATE No components found for:  CBMZ  Current Medications: Current Outpatient Medications  Medication Sig Dispense Refill   artificial tears (LACRILUBE) OINT ophthalmic ointment Place into the right eye at bedtime as needed for dry eyes. 3.5 g 1   Calcium-Magnesium-Vitamin D (CALCIUM 1200+D3 PO) Take 1 tablet by mouth daily.     [START ON 04/11/2021]  escitalopram (LEXAPRO) 10 MG tablet Take 1 tablet (10 mg total) by mouth daily. 30 tablet 2   fluticasone (FLONASE) 50 MCG/ACT nasal spray Use 2 sprays in each nostril once daily 16 g 3   LINZESS 290 MCG CAPS capsule TAKE 1 CAPSULE BY MOUTH  DAILY BEFORE BREAKFAST 90 capsule 3   montelukast (SINGULAIR) 10 MG tablet Take 10 mg by mouth as needed.     Multiple Vitamins-Minerals (CENTRUM ADULTS) TABS Take 1 tablet by mouth daily.     [START ON 05/09/2021] QUEtiapine (SEROQUEL) 25 MG tablet Take 1 tablet (25 mg total) by mouth at bedtime. 30 tablet 1   simvastatin (ZOCOR) 20 MG tablet TAKE 1 TABLET BY MOUTH AT  BEDTIME FOR CHOLESTEROL 90 tablet 3   travoprost, benzalkonium, (TRAVATAN) 0.004 % ophthalmic solution Place 1 drop into both eyes at bedtime.     vitamin C (ASCORBIC ACID) 500 MG tablet Take 500 mg by mouth daily.     No current facility-administered medications for this visit.     Musculoskeletal: Strength & Muscle Tone:  N/A Gait & Station:  N/A Patient leans: N/A  Psychiatric Specialty Exam: Review of Systems  Psychiatric/Behavioral:  Negative for agitation, behavioral problems, confusion, decreased concentration, dysphoric mood, hallucinations, self-injury, sleep disturbance and suicidal ideas. The patient is nervous/anxious. The patient is not hyperactive.   All other systems reviewed and are negative.  There were no vitals taken for this visit.There is no height or weight on file to calculate BMI.  General Appearance: Fairly Groomed  Eye Contact:  Good  Speech:  Clear and Coherent  Volume:  Normal  Mood:   better  Affect:  Appropriate, Congruent, and euthymic  Thought Process:  Coherent  Orientation:  Full (Time, Place, and Person)  Thought Content: Logical   Suicidal Thoughts:  No  Homicidal Thoughts:  No  Memory:  Immediate;   Good  Judgement:  Good  Insight:  Good  Psychomotor Activity:  Normal  Concentration:  Concentration: Good and Attention Span: Good  Recall:  Good  Fund of Knowledge: Good  Language: Good  Akathisia:  No  Handed:  Right  AIMS (if indicated): not done  Assets:  Communication Skills Desire for Improvement  ADL's:  Intact  Cognition: WNL  Sleep:  Good   Screenings: GAD-7    Flowsheet Row Office Visit from 07/04/2019 in [facility] Office Visit from 12/07/2018 in [facility]  Total GAD-7 Score 20 20      PHQ2-9    [place] Visit from 03/27/2021 in [facility] Office Visit from 02/13/2021 in [place] Visit from 02/05/2021 in [place] from 12/19/2020 in [place] Visit from 11/22/2020 in [facility]  PHQ-2 Total Score 0 0 0 1 0      [place] ED from 02/04/2021 in [place] Video Visit from 11/06/2020 in [place] Video Visit from 08/14/2020 in [place] No Risk No Risk No Risk        Assessment and Plan:  ANIKA SHORE is a 70 y.o. year old female with a history of depression, anxiety, Nontoxic multinodular goiter, diabetes, who presents for follow up appointment for below.   1. Anxiety disorder, unspecified type 2. MDD (major depressive disorder), recurrent, in partial remission ([place]) There has been overall improvement in her mood symptoms since starting quetiapine, which also coincided with taking care of a boy, who her daughter is fostering.  Recent psychosocial stressors includes bell's palsy, and loss of her husband.  She was advised to take quetiapine consistently to optimize treatment for anxiety and depression.  Will continue Lexapro to target depression and anxiety.   Plan 1. Continue lexapro  10 mg daily  (worsening in jitteriness at 15 mg) 2. Start Quetiapine 25 mg at night (take consistently) 3. Next appointment- 1/9 at 9 AM, in person,    Past trials of medication:  sertraline (more depressed, anxious), fluoxetine, buspirone,  mirtazapine,  Inderal, valium,    The patient demonstrates the following risk factors for suicide: Chronic risk factors for suicide include: psychiatric disorder of depression, anxiety. Acute risk factors for suicide include: unemployment. Protective factors for this patient include: positive social support, coping skills and hope for the future. Considering these factors, the overall suicide risk at this point appears to be low. Patient is appropriate for outpatient follow up.  Tamara Clay, MD 04/08/2021, 10:01 AM

## 2021-04-08 ENCOUNTER — Other Ambulatory Visit: Payer: Self-pay

## 2021-04-08 ENCOUNTER — Encounter: Payer: Self-pay | Admitting: Psychiatry

## 2021-04-08 ENCOUNTER — Telehealth (INDEPENDENT_AMBULATORY_CARE_PROVIDER_SITE_OTHER): Payer: Medicare Other | Admitting: Psychiatry

## 2021-04-08 DIAGNOSIS — F3341 Major depressive disorder, recurrent, in partial remission: Secondary | ICD-10-CM

## 2021-04-08 DIAGNOSIS — F419 Anxiety disorder, unspecified: Secondary | ICD-10-CM | POA: Diagnosis not present

## 2021-04-08 MED ORDER — QUETIAPINE FUMARATE 25 MG PO TABS: 25.0000 mg | ORAL_TABLET | Freq: Every day | ORAL | 1 refills | Status: DC

## 2021-04-08 MED ORDER — ESCITALOPRAM OXALATE 10 MG PO TABS: 10.0000 mg | ORAL_TABLET | Freq: Every day | ORAL | 2 refills | Status: DC

## 2021-04-08 NOTE — Patient Instructions (Signed)
1. Continue lexapro  10 mg daily   2. Start Quetiapine 25 mg at night  3. Next appointment- 1/9 at 9 AM

## 2021-04-16 DIAGNOSIS — E559 Vitamin D deficiency, unspecified: Secondary | ICD-10-CM | POA: Diagnosis not present

## 2021-04-16 DIAGNOSIS — E119 Type 2 diabetes mellitus without complications: Secondary | ICD-10-CM | POA: Diagnosis not present

## 2021-04-16 DIAGNOSIS — E785 Hyperlipidemia, unspecified: Secondary | ICD-10-CM | POA: Diagnosis not present

## 2021-04-17 LAB — HEPATIC FUNCTION PANEL
ALT: 10 IU/L (ref 0–32)
AST: 22 IU/L (ref 0–40)
Albumin: 4.4 g/dL (ref 3.8–4.8)
Alkaline Phosphatase: 51 IU/L (ref 44–121)
Bilirubin Total: 0.3 mg/dL (ref 0.0–1.2)
Bilirubin, Direct: <0.1 mg/dL (ref 0.00–0.40)
Total Protein: 6.7 g/dL (ref 6.0–8.5)

## 2021-04-17 LAB — LIPID PANEL
Chol/HDL Ratio: 3.2 ratio (ref 0.0–4.4)
Cholesterol, Total: 172 mg/dL (ref 100–199)
HDL: 54 mg/dL (ref >39)
LDL Chol Calc (NIH): 103 mg/dL — ABNORMAL HIGH (ref 0–99)
Triglycerides: 81 mg/dL (ref 0–149)
VLDL Cholesterol Cal: 15 mg/dL (ref 5–40)

## 2021-04-17 LAB — HEMOGLOBIN A1C
Est. average glucose Bld gHb Est-mCnc: 126 mg/dL
Hgb A1c MFr Bld: 6 % — ABNORMAL HIGH (ref 4.8–5.6)

## 2021-04-17 LAB — VITAMIN D 25 HYDROXY (VIT D DEFICIENCY, FRACTURES): Vit D, 25-Hydroxy: 50.9 ng/mL (ref 30.0–100.0)

## 2021-04-19 ENCOUNTER — Telehealth: Payer: Self-pay | Admitting: *Deleted

## 2021-04-19 NOTE — Telephone Encounter (Signed)
LVM to see if patient has had eye exam and where. If not, can schedule for [facility] 10/27

## 2021-04-25 ENCOUNTER — Ambulatory Visit: Payer: Medicare Other | Admitting: Family Medicine

## 2021-04-25 ENCOUNTER — Other Ambulatory Visit: Payer: Self-pay

## 2021-04-25 ENCOUNTER — Ambulatory Visit: Payer: Medicare Other

## 2021-04-25 LAB — HM DIABETES EYE EXAM

## 2021-05-01 ENCOUNTER — Encounter: Payer: Self-pay | Admitting: *Deleted

## 2021-05-18 NOTE — Progress Notes (Signed)
Referring Provider: Fayrene Helper, MD Primary Care Physician:  Tamara Helper, MD Primary GI Physician: Dr. Gala Romney  Chief Complaint  Patient presents with   Colonoscopy    Last tcs 2017   Constipation    Doesn't take linzess everyday   Gastroesophageal Reflux     HPI:   Tamara Lam is a 70 y.o. female with history of mild reflux, mild gastritis on EGD in 2019, constipation, presenting today to discuss scheduling colonoscopy.  Last colonoscopy May 2017 with pancolonic diverticulosis, nonbleeding internal hemorrhoids.  Recommended repeat in 5 years due to son with history of colon polyps.  Last seen in our office July 2020.  Constipation was well controlled on Linzess 290 mcg daily.  Complained of mild anal itching likely secondary to hemorrhoids with recommendations to trial Preparation H.  Plan to follow-up in March/April 2022 to schedule colonoscopy.  Today:   Constipation: Increase trouble with constipation for the last 2 to 3 months. Has trouble getting her bowels to move on her own.  Taking Linzess 290 mcg a couple days a week.  Does not want to take daily as she goes to an exercise class in the morning.  When taking Linzess, she has 2-3 formed stools.  Does not eat many fruits and vegetables.  States she probably does not drink enough water.  No brbpr or melena. No unintentional weight loss. No abdominal pain.   GERD: Notes mild increase in reflux symptoms since she has had increased trouble with constipation.  Little burning sensation.  Denies nausea, vomiting, dysphagia. Taking OTC omeprazole PRN- about twice a week which works fairly well.  Spaghetti and sweet potato seem to trigger symptoms. States she probably eats more fried/fatty foods than she should. Occasional soda. 1 cup of coffee daily.     Past Medical History:  Diagnosis Date   Abnormal CXR (chest x-ray)    Evaluated with Pulmonary- Dr. Luan Pulling- "pt. was told probably due to thin body frame"-saw no  issues-PFT test normal.   Anxiety 1980's    Arthritis    osteoarthritis -knees, hands   Chronic constipation    tx. Linzess   Chronic nausea    Diabetes mellitus without complication (HCC)    J1O level checked borderline x1, then further evaluated -Diabetes ruled out.   Glaucoma 2004   Dr. Venetia Maxon, laser to left eye Sept 2011, bilateral eye drops for this   Helicobacter pylori gastritis 04/2004   last EGD    Hematochezia    Hx of colonoscopy 08/2005   Dr. Gala Romney / hemorrhoids    Thyroid disease    thyroid goiter, nodules-no problems.   Weight loss     Past Surgical History:  Procedure Laterality Date   ABDOMINAL HYSTERECTOMY     arthoscopic surgery on right knee  2007   BIOPSY  03/03/2018   Procedure: BIOPSY;  Surgeon: Daneil Dolin, MD;  [facility]: [facility];  Service: Endoscopy;;  gastric for h. pylori   CHOLECYSTECTOMY  1990's   COLONOSCOPY  2011   Dr. Gala Romney: anal canal/internal hemorrhoids, dimintuive rectosigmoid polyp (polypoid rectal mucosa), few sigmoid diverticula    COLONOSCOPY N/A 10/30/2015   Dr. Gala Romney: diverticulosis, hemorrhoids, surveillance in 2022    ESOPHAGOGASTRODUODENOSCOPY  2005   Dr. Gala Romney: diffuse submucosal petechial hemorrhage of te gastric mucosa, some noncompliance of gastric cavity, failure to insufflate fully, multiple gastric biopsies. gastric bx, h.pylori   ESOPHAGOGASTRODUODENOSCOPY N/A 03/03/2018   Dr. Gala Romney: scattered antral erosions/erythema no ulcer/mass. Bx:  mild inflammation and No H.pylori   TOTAL KNEE ARTHROPLASTY Right 05/26/2016   Procedure: RIGHT TOTAL KNEE ARTHROPLASTY;  Surgeon: Gaynelle Arabian, MD;  Location: [facility];  Service: Orthopedics;  Laterality: Right;   VESICOVAGINAL FISTULA CLOSURE W/ TAH  1989    Current Outpatient Medications  Medication Sig Dispense Refill   artificial tears (LACRILUBE) OINT ophthalmic ointment Place into the right eye at bedtime as needed for dry eyes. 3.5 g 1   Calcium-Magnesium-Vitamin D (CALCIUM  1200+D3 PO) Take 1 tablet by mouth daily.     escitalopram (LEXAPRO) 10 MG tablet Take 1 tablet (10 mg total) by mouth daily. 30 tablet 2   fluticasone (FLONASE) 50 MCG/ACT nasal spray Use 2 sprays in each nostril once daily 16 g 3   LINZESS 290 MCG CAPS capsule TAKE 1 CAPSULE BY MOUTH  DAILY BEFORE BREAKFAST (Patient taking differently: Take 290 mcg by mouth as needed.) 90 capsule 3   montelukast (SINGULAIR) 10 MG tablet Take 10 mg by mouth as needed.     Multiple Vitamins-Minerals (CENTRUM ADULTS) TABS Take 1 tablet by mouth daily.     omeprazole (PRILOSEC) 20 MG capsule Take 20 mg by mouth as needed.     QUEtiapine (SEROQUEL) 25 MG tablet Take 1 tablet (25 mg total) by mouth at bedtime. 30 tablet 1   simvastatin (ZOCOR) 20 MG tablet TAKE 1 TABLET BY MOUTH AT  BEDTIME FOR CHOLESTEROL 90 tablet 3   travoprost, benzalkonium, (TRAVATAN) 0.004 % ophthalmic solution Place 1 drop into both eyes at bedtime.     vitamin C (ASCORBIC ACID) 500 MG tablet Take 500 mg by mouth daily.     No current facility-administered medications for this visit.    Allergies as of 05/20/2021   (No Known Allergies)    Family History  Problem Relation Age of Onset   Hypertension Mother    Glaucoma Mother    Diverticulosis Mother    Heart disease Mother    Lung cancer Father    Mental illness Sister    Depression Sister    Anxiety disorder Sister    Cancer Sister    Depression Sister    Anxiety disorder Sister    Depression Sister    Anxiety disorder Sister    Leukemia Brother        some form of leukemia   Depression Daughter    Colon polyps Son    Colon polyps Son        before age 44   Colon cancer Neg Hx     Social History   Socioeconomic History   Marital status: Widowed    Spouse name: Not on file   Number of children: Not on file   Years of education: Not on file   Highest education level: Not on file  Occupational History   Occupation: homemaker     Employer: UNEMPLOYED  Tobacco Use    Smoking status: Never   Smokeless tobacco: Never   Tobacco comments:    Never smoked  Vaping Use   Vaping Use: Never used  Substance and Sexual Activity   Alcohol use: No    Alcohol/week: 0.0 standard drinks   Drug use: No   Sexual activity: Not Currently  Other Topics Concern   Not on file  Social History Narrative   Not on file   Social Determinants of Health   Financial Resource Strain: Low Risk    Difficulty of Paying Living Expenses: Not hard at all  Food Insecurity: No  Food Insecurity   Worried About Charity fundraiser in the Last Year: Never true   Ran Out of Food in the Last Year: Never true  Transportation Needs: No Transportation Needs   Lack of Transportation (Medical): No   Lack of Transportation (Non-Medical): No  Physical Activity: Sufficiently Active   Days of Exercise per Week: 3 days   Minutes of Exercise per Session: 50 min  Stress: No Stress Concern Present   Feeling of Stress : Not at all  Social Connections: Moderately Isolated   Frequency of Communication with Friends and Family: More than three times a week   Frequency of Social Gatherings with Friends and Family: More than three times a week   Attends Religious Services: More than 4 times per year   Active Member of [employer] or Organizations: No   Attends Archivist Meetings: Never   Marital Status: Widowed    Review of Systems: Gen: Denies fever, chills, cold or flulike symptoms, presyncope, syncope. CV: Denies chest pain, palpitations. Resp: Denies dyspnea or cough. GI: See HPI Heme: See HPI  Physical Exam: BP 136/81   Pulse 75   Temp (!) 97.3 F (36.3 C) (Temporal)   Ht 5\' 7"  (1.702 m)   Wt 132 lb (59.9 kg)   BMI 20.67 kg/m  General:   Alert and oriented. No distress noted. Pleasant and cooperative.  Head:  Normocephalic and atraumatic. Eyes:  Conjuctiva clear without scleral icterus. Heart:  S1, S2 present without murmurs appreciated. Lungs:  Clear to auscultation  bilaterally. No wheezes, rales, or rhonchi. No distress.  Abdomen:  +BS, soft, non-tender and non-distended. No rebound or guarding. No HSM or masses noted. Msk:  Symmetrical without gross deformities. Normal posture. Extremities:  Without edema. Neurologic:  Alert and  oriented x4 Psych: Flat affect.    Assessment: 70 year old female with history of mild reflux, mild gastritis on EGD in 2019, constipation, presenting today for follow-up and to discuss scheduling colonoscopy.  Colon cancer screening:  Due for routine screening colonoscopy.  Last colonoscopy in May 2017 with pancolonic diverticulosis, nonbleeding internal hemorrhoids.  Recommended repeat in 5 years due to family history of colon polyps.  Currently without alarm symptoms.  Chronic constipation: Not adequately controlled.  Only taking Linzess 290 mcg a couple days a week.  Linzess is helpful for her when she takes it.  She is hesitant to take it some mornings as she goes to an exercise class and was not sure if she could take it when she gets home.  No alarm symptoms.  GERD: Mild reflux symptoms a couple times a week which patient feels is influenced by increased constipation over the last few months.  Currently using omeprazole OTC about twice a week which is working well.  No alarm symptoms.   Plan:  Proceed with colonoscopy with propofol with Dr. Gala Romney in the near future. The risks, benefits, and alternatives have been discussed with the patient in detail. The patient states understanding and desires to proceed. ASA 2 Resume Linzess 290 mcg daily.  Advised that she may take this prior to lunch rather than prior to breakfast as is concerned about having a bowel movement while at her exercise class in the morning. Benefiber 1 tablespoon daily x2 weeks then increase to twice daily. Increasing daily fiber intake through fruits and vegetables and drink at least 64 ounces of water daily. Counseled on GERD diet/lifestyle.   Separate written instructions provided. Continue OTC omeprazole as needed for now.  Advised  to call in 4 weeks if she has any ongoing significant problems with reflux. Follow-up in 6 months or sooner if needed.   Aliene Altes, PA-C [facility] 05/20/2021

## 2021-05-20 ENCOUNTER — Ambulatory Visit: Payer: Medicare Other | Admitting: Gastroenterology

## 2021-05-20 ENCOUNTER — Other Ambulatory Visit: Payer: Self-pay

## 2021-05-20 ENCOUNTER — Encounter: Payer: Self-pay | Admitting: Gastroenterology

## 2021-05-20 VITALS — BP 136/81 | HR 75 | Temp 97.3°F | Ht 67.0 in | Wt 132.0 lb

## 2021-05-20 DIAGNOSIS — K219 Gastro-esophageal reflux disease without esophagitis: Secondary | ICD-10-CM

## 2021-05-20 DIAGNOSIS — Z8371 Family history of colonic polyps: Secondary | ICD-10-CM | POA: Diagnosis not present

## 2021-05-20 DIAGNOSIS — K5909 Other constipation: Secondary | ICD-10-CM | POA: Diagnosis not present

## 2021-05-20 DIAGNOSIS — Z1211 Encounter for screening for malignant neoplasm of colon: Secondary | ICD-10-CM | POA: Diagnosis not present

## 2021-05-20 NOTE — Patient Instructions (Addendum)
We will arrange for you to have a colonoscopy in the near future with Dr. Gala Romney.  For constipation: Take Linzess 290 mcg daily.  As we discussed, you can try taking this medication when you get home from your exercise class.  If taking Linzess every day is too strong, please let me know, and we can try a lower dose. Add Benefiber 1 tablespoon daily x2 weeks then increase to twice daily. Try increasing your daily fiber intake through fruits and vegetables. Increase your water intake.  Try drinking at least 64 ounces of water daily.  For reflux:  Hopefully, your mild reflux symptoms will improve with better management of constipation. You may continue to take omeprazole over-the-counter as needed for reflux. Follow a GERD diet:  Avoid fried, fatty, greasy, spicy, citrus foods. Avoid caffeine and carbonated beverages. Avoid chocolate. Try eating 4-6 small meals a day rather than 3 large meals. Do not eat within 3 hours of laying down. Prop head of bed up on wood or bricks to create a 6 inch incline. After about 4 weeks, if you continue with any significant reflux problems, please let me know.   We will plan to see you back in about 6 months.  Do not hesitate to call if you have any questions or concerns prior to your next visit.  I hope you have a very happy Thanksgiving!  Aliene Altes, PA-C [facility]

## 2021-05-21 ENCOUNTER — Telehealth: Payer: Self-pay

## 2021-05-21 NOTE — Telephone Encounter (Signed)
Tried to call pt to schedule TCS w/propofol w/RMR ASA 2, LMOVM for return call.

## 2021-05-22 NOTE — Telephone Encounter (Signed)
Spoke to pt, she will call office back when she gets home to schedule procedure.

## 2021-05-22 NOTE — Telephone Encounter (Signed)
Patient called back. She is having to wait for January d/t transportation

## 2021-06-03 DIAGNOSIS — H401131 Primary open-angle glaucoma, bilateral, mild stage: Secondary | ICD-10-CM | POA: Diagnosis not present

## 2021-06-03 DIAGNOSIS — H25813 Combined forms of age-related cataract, bilateral: Secondary | ICD-10-CM | POA: Diagnosis not present

## 2021-06-12 MED ORDER — PEG 3350-KCL-NA BICARB-NACL 420 G PO SOLR: ORAL | 0 refills | Status: DC

## 2021-06-12 NOTE — Telephone Encounter (Signed)
Called pt. She has been scheduled for 1/9 at 10:00am. Aware will send prep instructions and rx to pharmacy.

## 2021-06-12 NOTE — Addendum Note (Signed)
Addended by: Cheron Every on: 06/12/2021 02:53 PM   Modules accepted: Orders

## 2021-07-02 ENCOUNTER — Telehealth: Payer: Self-pay | Admitting: *Deleted

## 2021-07-02 NOTE — Telephone Encounter (Signed)
PA approved via [facility] website. Auth# [id number], DOS: Jul 08, 2021 - Oct 06, 2021

## 2021-07-04 NOTE — Progress Notes (Deleted)
BH MD/PA/NP OP Progress Note  07/04/2021 8:57 AM Tamara Lam  MRN:  [id number]  Chief Complaint:  HPI: *** Visit Diagnosis: No diagnosis found.  Past Psychiatric History: Please see initial evaluation for full details. I have reviewed the history. No updates at this time.     Past Medical History:  Past Medical History:  Diagnosis Date   Abnormal CXR (chest x-ray)    Evaluated with Pulmonary- Dr. Luan Pulling- "pt. was told probably due to thin body frame"-saw no issues-PFT test normal.   Anxiety 1980's    Arthritis    osteoarthritis -knees, hands   Chronic constipation    tx. Linzess   Chronic nausea    Diabetes mellitus without complication (HCC)    W0J level checked borderline x1, then further evaluated -Diabetes ruled out.   Glaucoma 2004   Dr. Venetia Maxon, laser to left eye Sept 2011, bilateral eye drops for this   Helicobacter pylori gastritis 04/2004   last EGD    Hematochezia    Hx of colonoscopy 08/2005   Dr. Gala Romney / hemorrhoids    Thyroid disease    thyroid goiter, nodules-no problems.   Weight loss     Past Surgical History:  Procedure Laterality Date   ABDOMINAL HYSTERECTOMY     arthoscopic surgery on right knee  2007   BIOPSY  03/03/2018   Procedure: BIOPSY;  Surgeon: Daneil Dolin, MD;  [facility];  Service: Endoscopy;;  gastric for h. pylori   CHOLECYSTECTOMY  1990's   COLONOSCOPY  2011   Dr. Gala Romney: anal canal/internal hemorrhoids, dimintuive rectosigmoid polyp (polypoid rectal mucosa), few sigmoid diverticula    COLONOSCOPY N/A 10/30/2015   Dr. Gala Romney: diverticulosis, hemorrhoids, surveillance in 2022    ESOPHAGOGASTRODUODENOSCOPY  2005   Dr. Gala Romney: diffuse submucosal petechial hemorrhage of te gastric mucosa, some noncompliance of gastric cavity, failure to insufflate fully, multiple gastric biopsies. gastric bx, h.pylori   ESOPHAGOGASTRODUODENOSCOPY N/A 03/03/2018   Dr. Gala Romney: scattered antral erosions/erythema no ulcer/mass. Bx: mild inflammation  and No H.pylori   TOTAL KNEE ARTHROPLASTY Right 05/26/2016   Procedure: RIGHT TOTAL KNEE ARTHROPLASTY;  Surgeon: Gaynelle Arabian, MD;  Location: [facility];  Service: Orthopedics;  Laterality: Right;   VESICOVAGINAL FISTULA CLOSURE W/ TAH  1989    Family Psychiatric History: Please see initial evaluation for full details. I have reviewed the history. No updates at this time.     Family History:  Family History  Problem Relation Age of Onset   Hypertension Mother    Glaucoma Mother    Diverticulosis Mother    Heart disease Mother    Lung cancer Father    Mental illness Sister    Depression Sister    Anxiety disorder Sister    Cancer Sister    Depression Sister    Anxiety disorder Sister    Depression Sister    Anxiety disorder Sister    Leukemia Brother        some form of leukemia   Depression Daughter    Colon polyps Son    Colon polyps Son        before age 81   Colon cancer Neg Hx     Social History:  Social History   Socioeconomic History   Marital status: Widowed    Spouse name: Not on file   Number of children: Not on file   Years of education: Not on file   Highest education level: Not on file  Occupational History   Occupation: homemaker  Employer: UNEMPLOYED  Tobacco Use   Smoking status: Never   Smokeless tobacco: Never   Tobacco comments:    Never smoked  Vaping Use   Vaping Use: Never used  Substance and Sexual Activity   Alcohol use: No    Alcohol/week: 0.0 standard drinks   Drug use: No   Sexual activity: Not Currently  Other Topics Concern   Not on file  Social History Narrative   Not on file   Social Determinants of Health   Financial Resource Strain: Low Risk    Difficulty of Paying Living Expenses: Not hard at all  Food Insecurity: No Food Insecurity   Worried About Charity fundraiser in the Last Year: Never true   [place] in the Last Year: Never true  Transportation Needs: No Transportation Needs   Lack of Transportation  (Medical): No   Lack of Transportation (Non-Medical): No  Physical Activity: Sufficiently Active   Days of Exercise per Week: 3 days   Minutes of Exercise per Session: 50 min  Stress: No Stress Concern Present   Feeling of Stress : Not at all  Social Connections: Moderately Isolated   Frequency of Communication with Friends and Family: More than three times a week   Frequency of Social Gatherings with Friends and Family: More than three times a week   Attends Religious Services: More than 4 times per year   Active Member of [employer] or Organizations: No   Attends Archivist Meetings: Never   Marital Status: Widowed    Allergies: No Known Allergies  Metabolic Disorder Labs: Lab Results  Component Value Date   HGBA1C 6.0 (H) 04/16/2021   MPG 114 05/05/2016   No results found for: PROLACTIN Lab Results  Component Value Date   CHOL 172 04/16/2021   TRIG 81 04/16/2021   HDL 54 04/16/2021   CHOLHDL 3.2 04/16/2021   VLDL 13 05/05/2016   LDLCALC 103 (H) 04/16/2021   LDLCALC 106 (H) 07/26/2020   Lab Results  Component Value Date   TSH 0.998 09/04/2020   TSH 1.00 09/04/2020    Therapeutic Level Labs: No results found for: LITHIUM No results found for: VALPROATE No components found for:  CBMZ  Current Medications: Current Outpatient Medications  Medication Sig Dispense Refill   artificial tears (LACRILUBE) OINT ophthalmic ointment Place into the right eye at bedtime as needed for dry eyes. (Patient not taking: Reported on 07/02/2021) 3.5 g 1   Calcium-Magnesium-Vitamin D (CALCIUM 1200+D3 PO) Take 1 tablet by mouth daily.     escitalopram (LEXAPRO) 10 MG tablet Take 1 tablet (10 mg total) by mouth daily. 30 tablet 2   famotidine (PEPCID) 20 MG tablet Take 20 mg by mouth daily as needed for heartburn.     fluticasone (FLONASE) 50 MCG/ACT nasal spray Use 2 sprays in each nostril once daily (Patient not taking: Reported on 07/02/2021) 16 g 3   LINZESS 290 MCG CAPS capsule  TAKE 1 CAPSULE BY MOUTH  DAILY BEFORE BREAKFAST (Patient taking differently: Take 290 mcg by mouth daily as needed (Cosptipation).) 90 capsule 3   montelukast (SINGULAIR) 10 MG tablet Take 10 mg by mouth at bedtime as needed (allergies).     Multiple Vitamins-Minerals (CENTRUM ADULTS) TABS Take 1 tablet by mouth daily.     polyethylene glycol-electrolytes (NULYTELY) 420 g solution As directed 4000 mL 0   QUEtiapine (SEROQUEL) 25 MG tablet Take 1 tablet (25 mg total) by mouth at bedtime. 30 tablet 1  simvastatin (ZOCOR) 20 MG tablet TAKE 1 TABLET BY MOUTH AT  BEDTIME FOR CHOLESTEROL 90 tablet 3   travoprost, benzalkonium, (TRAVATAN) 0.004 % ophthalmic solution Place 1 drop into both eyes at bedtime.     vitamin C (ASCORBIC ACID) 500 MG tablet Take 500 mg by mouth daily.     No current facility-administered medications for this visit.     Musculoskeletal: Strength & Muscle Tone:  N/A Gait & Station:  N/A Patient leans: N/A  Psychiatric Specialty Exam: Review of Systems  There were no vitals taken for this visit.There is no height or weight on file to calculate BMI.  General Appearance: {Appearance:22683}  Eye Contact:  {BHH EYE CONTACT:22684}  Speech:  Clear and Coherent  Volume:  Normal  Mood:  {BHH MOOD:22306}  Affect:  {Affect (PAA):22687}  Thought Process:  Coherent  Orientation:  Full (Time, Place, and Person)  Thought Content: Logical   Suicidal Thoughts:  {ST/HT (PAA):22692}  Homicidal Thoughts:  {ST/HT (PAA):22692}  Memory:  Immediate;   Good  Judgement:  {Judgement (PAA):22694}  Insight:  {Insight (PAA):22695}  Psychomotor Activity:  Normal  Concentration:  Concentration: Good and Attention Span: Good  Recall:  Good  Fund of Knowledge: Good  Language: Good  Akathisia:  No  Handed:  Right  AIMS (if indicated): not done  Assets:  Communication Skills Desire for Improvement  ADL's:  Intact  Cognition: WNL  Sleep:  {BHH GOOD/FAIR/POOR:22877}   Screenings: GAD-7     Flowsheet Row Office Visit from 07/04/2019 in [facility] Office Visit from 12/07/2018 in [facility]  Total GAD-7 Score 20 20      PHQ2-9    [place] Visit from 03/27/2021 in [facility] Office Visit from 02/13/2021 in [facility] Office Visit from 02/05/2021 in [place] from 12/19/2020 in [facility] Office Visit from 11/22/2020 in [facility]  PHQ-2 Total Score 0 0 0 1 0      Flowsheet Row ED from 02/04/2021 in [place] Video Visit from 11/06/2020 in [place] Video Visit from 08/14/2020 in [place] No Risk No Risk No Risk        Assessment and Plan:  Tamara Lam is a 71 y.o. year old female with a history of depression, anxiety, Nontoxic multinodular goiter, diabetes, who presents for follow up appointment for below.    1. Anxiety disorder, unspecified type 2. MDD (major depressive disorder), recurrent, in partial remission ([place]) There has been overall improvement in her mood symptoms since starting quetiapine, which also coincided with taking care of a boy, who her daughter is fostering.  Recent psychosocial stressors includes bell's palsy, and loss of her husband.  She was advised to take quetiapine consistently to optimize treatment for anxiety and depression.  Will continue Lexapro to target depression and anxiety.    Plan 1. Continue lexapro  10 mg daily  (worsening in jitteriness at 15 mg) 2. Start Quetiapine 25 mg at night (take consistently) 3. Next appointment- 1/9 at 9 AM, in person,    Past trials of medication: sertraline (more depressed, anxious), fluoxetine, buspirone,  mirtazapine,  Inderal, valium,    The patient demonstrates the following risk factors for suicide: Chronic risk factors for suicide include: psychiatric disorder of depression, anxiety.  Acute risk factors for suicide include: unemployment. Protective factors for this patient include: positive social support, coping skills and hope for the  future. Considering these factors, the overall suicide risk at this point appears to be low. Patient is appropriate for outpatient follow up.       Norman Clay, MD 07/04/2021, 8:57 AM

## 2021-07-08 ENCOUNTER — Encounter (HOSPITAL_COMMUNITY): Payer: Self-pay | Admitting: Internal Medicine

## 2021-07-08 ENCOUNTER — Encounter (HOSPITAL_COMMUNITY)
Admission: RE | Disposition: A | Discharge status: Home / Self Care | Payer: Self-pay | Source: Home / Self Care | Attending: Internal Medicine

## 2021-07-08 ENCOUNTER — Other Ambulatory Visit: Payer: Self-pay

## 2021-07-08 ENCOUNTER — Ambulatory Visit (HOSPITAL_COMMUNITY): Payer: Medicare Other | Admitting: Anesthesiology

## 2021-07-08 ENCOUNTER — Ambulatory Visit: Payer: Medicare Other | Admitting: Psychiatry

## 2021-07-08 ENCOUNTER — Ambulatory Visit (HOSPITAL_COMMUNITY)
Admission: RE | Admit: 2021-07-08 | Discharge: 2021-07-08 | Disposition: A | Discharge status: Home / Self Care | Payer: Medicare Other | Attending: Internal Medicine | Admitting: Internal Medicine

## 2021-07-08 DIAGNOSIS — F418 Other specified anxiety disorders: Secondary | ICD-10-CM | POA: Insufficient documentation

## 2021-07-08 DIAGNOSIS — K219 Gastro-esophageal reflux disease without esophagitis: Secondary | ICD-10-CM | POA: Diagnosis not present

## 2021-07-08 DIAGNOSIS — Z1211 Encounter for screening for malignant neoplasm of colon: Secondary | ICD-10-CM

## 2021-07-08 DIAGNOSIS — K5730 Diverticulosis of large intestine without perforation or abscess without bleeding: Secondary | ICD-10-CM | POA: Diagnosis not present

## 2021-07-08 DIAGNOSIS — K5900 Constipation, unspecified: Secondary | ICD-10-CM | POA: Diagnosis not present

## 2021-07-08 DIAGNOSIS — Z8371 Family history of colonic polyps: Secondary | ICD-10-CM | POA: Diagnosis not present

## 2021-07-08 DIAGNOSIS — K640 First degree hemorrhoids: Secondary | ICD-10-CM | POA: Diagnosis not present

## 2021-07-08 HISTORY — PX: COLONOSCOPY WITH PROPOFOL: SHX5780

## 2021-07-08 SURGERY — COLONOSCOPY WITH PROPOFOL
Anesthesia: General

## 2021-07-08 MED ORDER — LACTATED RINGERS IV SOLN: INTRAVENOUS | Status: DC

## 2021-07-08 MED ORDER — PROPOFOL 10 MG/ML IV BOLUS
INTRAVENOUS | Status: DC | PRN
Start: 2021-07-08 — End: 2021-07-08
  Administered 2021-07-08: 130 mg via INTRAVENOUS
  Administered 2021-07-08: 50 mg via INTRAVENOUS
  Administered 2021-07-08: 20 mg via INTRAVENOUS

## 2021-07-08 MED ORDER — STERILE WATER FOR IRRIGATION IR SOLN
Status: DC | PRN
  Administered 2021-07-08: 100 mL

## 2021-07-08 MED ORDER — LIDOCAINE HCL (CARDIAC) PF 50 MG/5ML IV SOSY
PREFILLED_SYRINGE | INTRAVENOUS | Status: DC | PRN
  Administered 2021-07-08: 50 mg via INTRAVENOUS

## 2021-07-08 NOTE — Op Note (Signed)
[facility] Patient Name: Tamara Lam Procedure Date: 07/08/2021 8:57 AM MRN: [id number] Date of Birth: [birth year] Attending MD: Norvel Richards , MD CSN: [id number] Age: 71 Admit Type: Outpatient Procedure:                Colonoscopy Indications:              Colon cancer screening in patient at increased                            risk: Family history of 1st-degree relative with                            colon polyps before age 94 years Providers:                Norvel Richards, MD, Charlsie Quest. Theda Sers RN, RN,                            Aram Candela Referring MD:              Medicines:                Propofol per Anesthesia Complications:            No immediate complications. Estimated Blood Loss:     Estimated blood loss: none. Procedure:                Pre-Anesthesia Assessment:                           - Prior to the procedure, a History and Physical                            was performed, and patient medications and                            allergies were reviewed. The patient's tolerance of                            previous anesthesia was also reviewed. The risks                            and benefits of the procedure and the sedation                            options and risks were discussed with the patient.                            All questions were answered, and informed consent                            was obtained. Prior Anticoagulants: The patient has                            taken no previous anticoagulant or antiplatelet  agents. ASA Grade Assessment: II - A patient with                            mild systemic disease. After reviewing the risks                            and benefits, the patient was deemed in                            satisfactory condition to undergo the procedure.                           After obtaining informed consent, the colonoscope                            was passed under direct  vision. Throughout the                            procedure, the patient's blood pressure, pulse, and                            oxygen saturations were monitored continuously. The                            [id number]) scope was introduced through the                            anus and advanced to the the cecum, identified by                            appendiceal orifice and ileocecal valve. The                            colonoscopy was performed without difficulty. The                            patient tolerated the procedure well. The quality                            of the bowel preparation was adequate. Scope In: 9:16:09 AM Scope Out: 9:26:57 AM Scope Withdrawal Time: 0 hours 7 minutes 9 seconds  Total Procedure Duration: 0 hours 10 minutes 48 seconds  Findings:      The perianal and digital rectal examinations were normal.      Non-bleeding internal hemorrhoids were found during retroflexion. The       hemorrhoids were moderate, medium-sized and Grade I (internal       hemorrhoids that do not prolapse).      Scattered small-mouthed diverticula were found in the entire colon.      The exam was otherwise without abnormality on direct and retroflexion       views. Impression:               - Non-bleeding internal hemorrhoids.                           -  Diverticulosis in the entire examined colon.                           The examination was otherwise normal on direct and                            retroflexion views.                           - No specimens collected. Moderate Sedation:      Moderate (conscious) sedation was personally administered by an       anesthesia professional. The following parameters were monitored: oxygen       saturation, heart rate, blood pressure, respiratory rate, EKG, adequacy       of pulmonary ventilation, and response to care. Recommendation:           - Patient has a contact number available for                             emergencies. The signs and symptoms of potential                            delayed complications were discussed with the                            patient. Return to normal activities tomorrow.                            Written discharge instructions were provided to the                            patient.                           - Advance diet as tolerated.                           - Continue present medications.                           - Repeat colonoscopy in 5 years for screening                            purposes.                           - Return to GI office (date not yet determined). Procedure Code(s):        --- Professional ---                           [phone], Colonoscopy, flexible; diagnostic, including                            collection of specimen(s) by brushing or washing,  when performed (separate procedure) Diagnosis Code(s):        --- Professional ---                           Z83.71, Family history of colonic polyps                           K64.0, First degree hemorrhoids                           K57.30, Diverticulosis of large intestine without                            perforation or abscess without bleeding CPT copyright 2019 American Medical Association. All rights reserved. The codes documented in this report are preliminary and upon coder review may  be revised to meet current compliance requirements. Cristopher Estimable. Joline Encalada, MD Norvel Richards, MD 07/08/2021 9:34:09 AM This report has been signed electronically. Number of Addenda: 0

## 2021-07-08 NOTE — H&P (Signed)
@LOGO @   Primary Care Physician:  Fayrene Helper, MD Primary Gastroenterologist:  Dr. Gala Romney  Pre-Procedure History & Physical: HPI:  Tamara Lam is a 71 y.o. female here for high risk screening colonoscopy.  Son with precancerous colon polyps.  GI symptoms include constipation well managed with Linzess.  Does not take it every day.  She does experience some constipation in between dosing.  He has not had diarrhea.  Past Medical History:  Diagnosis Date   Abnormal CXR (chest x-ray)    Evaluated with Pulmonary- Dr. Luan Pulling- "pt. was told probably due to thin body frame"-saw no issues-PFT test normal.   Anxiety 1980's    Arthritis    osteoarthritis -knees, hands   Chronic constipation    tx. Linzess   Chronic nausea    Glaucoma 2004   Dr. Venetia Maxon, laser to left eye Sept 2011, bilateral eye drops for this   Helicobacter pylori gastritis 04/2004   last EGD    Hematochezia    Hx of colonoscopy 08/2005   Dr. Gala Romney / hemorrhoids    Thyroid disease    thyroid goiter, nodules-no problems.   Weight loss     Past Surgical History:  Procedure Laterality Date   ABDOMINAL HYSTERECTOMY     arthoscopic surgery on right knee  2007   BIOPSY  03/03/2018   Procedure: BIOPSY;  Surgeon: Daneil Dolin, MD;  Location: [facility];  Service: Endoscopy;;  gastric for h. pylori   CHOLECYSTECTOMY  1990's   COLONOSCOPY  2011   Dr. Gala Romney: anal canal/internal hemorrhoids, dimintuive rectosigmoid polyp (polypoid rectal mucosa), few sigmoid diverticula    COLONOSCOPY N/A 10/30/2015   Dr. Gala Romney: diverticulosis, hemorrhoids, surveillance in 2022    ESOPHAGOGASTRODUODENOSCOPY  2005   Dr. Gala Romney: diffuse submucosal petechial hemorrhage of te gastric mucosa, some noncompliance of gastric cavity, failure to insufflate fully, multiple gastric biopsies. gastric bx, h.pylori   ESOPHAGOGASTRODUODENOSCOPY N/A 03/03/2018   Dr. Gala Romney: scattered antral erosions/erythema no ulcer/mass. Bx: mild inflammation and No  H.pylori   TOTAL KNEE ARTHROPLASTY Right 05/26/2016   Procedure: RIGHT TOTAL KNEE ARTHROPLASTY;  Surgeon: Gaynelle Arabian, MD;  Location: [facility];  Service: Orthopedics;  Laterality: Right;   VESICOVAGINAL FISTULA CLOSURE W/ TAH  1989    Prior to Admission medications   Medication Sig Start Date End Date Taking? Authorizing Provider  artificial tears (LACRILUBE) OINT ophthalmic ointment Place into the right eye at bedtime as needed for dry eyes. 02/05/21  Yes Noreene Larsson, NP  Calcium-Magnesium-Vitamin D (CALCIUM 1200+D3 PO) Take 1 tablet by mouth daily.   Yes [provider]  escitalopram (LEXAPRO) 10 MG tablet Take 1 tablet (10 mg total) by mouth daily. 04/11/21 07/10/21 Yes Hisada, Elie Goody, MD  famotidine (PEPCID) 20 MG tablet Take 20 mg by mouth daily as needed for heartburn.   Yes [provider]  LINZESS 290 MCG CAPS capsule TAKE 1 CAPSULE BY MOUTH  DAILY BEFORE BREAKFAST Patient taking differently: Take 290 mcg by mouth daily as needed (Cosptipation). 05/31/20  Yes Carlis Stable, NP  montelukast (SINGULAIR) 10 MG tablet Take 10 mg by mouth at bedtime as needed (allergies).   Yes [provider]  Multiple Vitamins-Minerals (CENTRUM ADULTS) TABS Take 1 tablet by mouth daily.   Yes [provider]  QUEtiapine (SEROQUEL) 25 MG tablet Take 1 tablet (25 mg total) by mouth at bedtime. 05/09/21 07/08/21 Yes Hisada, Elie Goody, MD  simvastatin (ZOCOR) 20 MG tablet TAKE 1 TABLET BY MOUTH AT  BEDTIME  FOR CHOLESTEROL 03/16/20  Yes Fayrene Helper, MD  travoprost, benzalkonium, (TRAVATAN) 0.004 % ophthalmic solution Place 1 drop into both eyes at bedtime.   Yes [provider]  vitamin C (ASCORBIC ACID) 500 MG tablet Take 500 mg by mouth daily.   Yes [provider]  fluticasone (FLONASE) 50 MCG/ACT nasal spray Use 2 sprays in each nostril once daily Patient not taking: Reported on 07/02/2021 02/20/20   Fayrene Helper, MD  polyethylene glycol-electrolytes  (NULYTELY) 420 g solution As directed 06/12/21   Keelyn Fjelstad, Cristopher Estimable, MD    Allergies as of 06/12/2021   (No Known Allergies)    Family History  Problem Relation Age of Onset   Hypertension Mother    Glaucoma Mother    Diverticulosis Mother    Heart disease Mother    Lung cancer Father    Mental illness Sister    Depression Sister    Anxiety disorder Sister    Cancer Sister    Depression Sister    Anxiety disorder Sister    Depression Sister    Anxiety disorder Sister    Leukemia Brother        some form of leukemia   Depression Daughter    Colon polyps Son    Colon polyps Son        before age 64   Colon cancer Neg Hx     Social History   Socioeconomic History   Marital status: Widowed    Spouse name: Not on file   Number of children: Not on file   Years of education: Not on file   Highest education level: Not on file  Occupational History   Occupation: homemaker     Employer: UNEMPLOYED  Tobacco Use   Smoking status: Never   Smokeless tobacco: Never   Tobacco comments:    Never smoked  Vaping Use   Vaping Use: Never used  Substance and Sexual Activity   Alcohol use: No    Alcohol/week: 0.0 standard drinks   Drug use: No   Sexual activity: Not Currently  Other Topics Concern   Not on file  Social History Narrative   Not on file   Social Determinants of Health   Financial Resource Strain: Low Risk    Difficulty of Paying Living Expenses: Not hard at all  Food Insecurity: No Food Insecurity   Worried About Charity fundraiser in the Last Year: Never true   [place] in the Last Year: Never true  Transportation Needs: No Transportation Needs   Lack of Transportation (Medical): No   Lack of Transportation (Non-Medical): No  Physical Activity: Sufficiently Active   Days of Exercise per Week: 3 days   Minutes of Exercise per Session: 50 min  Stress: No Stress Concern Present   Feeling of Stress : Not at all  Social Connections: Moderately  Isolated   Frequency of Communication with Friends and Family: More than three times a week   Frequency of Social Gatherings with Friends and Family: More than three times a week   Attends Religious Services: More than 4 times per year   Active Member of [employer] or Organizations: No   Attends Archivist Meetings: Never   Marital Status: Widowed  Human resources officer Violence: Not At Risk   Fear of Current or Ex-Partner: No   Emotionally Abused: No   Physically Abused: No   Sexually Abused: No    Review of Systems: See HPI, otherwise negative ROS  Physical Exam: BP (!) 152/85    Pulse 82    Temp 98.2 F (36.8 C) (Oral)    Resp 18    Ht 5\' 7"  (1.702 m)    Wt 59 kg    SpO2 97%    BMI 20.36 kg/m  General:   Alert,  Well-developed, well-nourished, pleasant and cooperative in NAD Neck:  Supple; no masses or thyromegaly. No significant cervical adenopathy. Lungs:  Clear throughout to auscultation.   No wheezes, crackles, or rhonchi. No acute distress. Heart:  Regular rate and rhythm; no murmurs, clicks, rubs,  or gallops. Abdomen: Non-distended, normal bowel sounds.  Soft and nontender without appreciable mass or hepatosplenomegaly.  Pulses:  Normal pulses noted. Extremities:  Without clubbing or edema.  Impression/Plan: 71 year old lady with a positive family history of colon polyps.  Here for high risk screening colonoscopy The risks, benefits, limitations, alternatives and imponderables have been reviewed with the patient. Questions have been answered. All parties are agreeable.       Notice: This dictation was prepared with Dragon dictation along with smaller phrase technology. Any transcriptional errors that result from this process are unintentional and may not be corrected upon review.

## 2021-07-08 NOTE — Anesthesia Postprocedure Evaluation (Signed)
Anesthesia Post Note  Patient: Tamara Lam  Procedure(s) Performed: COLONOSCOPY WITH PROPOFOL  Patient location during evaluation: Phase II Anesthesia Type: General Level of consciousness: awake Pain management: pain level controlled Vital Signs Assessment: post-procedure vital signs reviewed and stable Respiratory status: spontaneous breathing and respiratory function stable Cardiovascular status: blood pressure returned to baseline and stable Postop Assessment: no headache and no apparent nausea or vomiting Anesthetic complications: no Comments: Late entry   No notable events documented.   Last Vitals:  Vitals:   07/08/21 0930 07/08/21 0932  BP: (!) 91/49 (!) 101/58  Pulse: 73   Resp: 15   Temp: 36.6 C   SpO2: 99%     Last Pain:  Vitals:   07/08/21 0930  TempSrc: Oral  PainSc: 0-No pain                 Louann Sjogren

## 2021-07-08 NOTE — Discharge Instructions (Signed)
°  Colonoscopy Discharge Instructions  Read the instructions outlined below and refer to this sheet in the next few weeks. These discharge instructions provide you with general information on caring for yourself after you leave the hospital. Your doctor may also give you specific instructions. While your treatment has been planned according to the most current medical practices available, unavoidable complications occasionally occur. If you have any problems or questions after discharge, call Dr. Gala Romney at [phone]. ACTIVITY You may resume your regular activity, but move at a slower pace for the next 24 hours.  Take frequent rest periods for the next 24 hours.  Walking will help get rid of the air and reduce the bloated feeling in your belly (abdomen).  No driving for 24 hours (because of the medicine (anesthesia) used during the test).   Do not sign any important legal documents or operate any machinery for 24 hours (because of the anesthesia used during the test).  NUTRITION Drink plenty of fluids.  You may resume your normal diet as instructed by your doctor.  Begin with a light meal and progress to your normal diet. Heavy or fried foods are harder to digest and may make you feel sick to your stomach (nauseated).  Avoid alcoholic beverages for 24 hours or as instructed.  MEDICATIONS You may resume your normal medications unless your doctor tells you otherwise.  WHAT YOU CAN EXPECT TODAY Some feelings of bloating in the abdomen.  Passage of more gas than usual.  Spotting of blood in your stool or on the toilet paper.  IF YOU HAD POLYPS REMOVED DURING THE COLONOSCOPY: No aspirin products for 7 days or as instructed.  No alcohol for 7 days or as instructed.  Eat a soft diet for the next 24 hours.  FINDING OUT THE RESULTS OF YOUR TEST Not all test results are available during your visit. If your test results are not back during the visit, make an appointment with your caregiver to find out the  results. Do not assume everything is normal if you have not heard from your caregiver or the medical facility. It is important for you to follow up on all of your test results.  SEEK IMMEDIATE MEDICAL ATTENTION IF: You have more than a spotting of blood in your stool.  Your belly is swollen (abdominal distention).  You are nauseated or vomiting.  You have a temperature over 101.  You have abdominal pain or discomfort that is severe or gets worse throughout the day.     Diverticulosis and hemorrhoids found today  No polyps  Informational constipation provided  Information on diverticulosis provided  Highly recommend you take Linzess every day whether you feel you needed or not  Repeat screening colonoscopy in 5 years  At patient request, I called Tamara Lam at [phone] - -reviewed findings and recommendations

## 2021-07-08 NOTE — Anesthesia Procedure Notes (Signed)
Date/Time: 07/08/2021 9:10 AM Performed by: Vista Deck, CRNA Pre-anesthesia Checklist: Patient identified, Emergency Drugs available, Suction available, Timeout performed and Patient being monitored Patient Re-evaluated:Patient Re-evaluated prior to induction Oxygen Delivery Method: Nasal Cannula

## 2021-07-08 NOTE — Anesthesia Preprocedure Evaluation (Signed)
Anesthesia Evaluation  Patient identified by MRN, date of birth, ID band Patient awake    Reviewed: Allergy & Precautions, H&P , NPO status , Patient's Chart, lab work & pertinent test results, reviewed documented beta blocker date and time   Airway Mallampati: II  TM Distance: >3 FB Neck ROM: full    Dental no notable dental hx.    Pulmonary neg pulmonary ROS,    Pulmonary exam normal breath sounds clear to auscultation       Cardiovascular Exercise Tolerance: Good negative cardio ROS   Rhythm:regular Rate:Normal     Neuro/Psych PSYCHIATRIC DISORDERS Anxiety Depression  Neuromuscular disease    GI/Hepatic Neg liver ROS, GERD  Medicated,  Endo/Other  negative endocrine ROS  Renal/GU negative Renal ROS  negative genitourinary   Musculoskeletal   Abdominal   Peds  Hematology negative hematology ROS (+)   Anesthesia Other Findings   Reproductive/Obstetrics negative OB ROS                             Anesthesia Physical Anesthesia Plan  ASA: 2  Anesthesia Plan: General   Post-op Pain Management:    Induction:   PONV Risk Score and Plan: Propofol infusion  Airway Management Planned:   Additional Equipment:   Intra-op Plan:   Post-operative Plan:   Informed Consent: I have reviewed the patients History and Physical, chart, labs and discussed the procedure including the risks, benefits and alternatives for the proposed anesthesia with the patient or authorized representative who has indicated his/her understanding and acceptance.     Dental Advisory Given  Plan Discussed with: CRNA  Anesthesia Plan Comments:         Anesthesia Quick Evaluation

## 2021-07-08 NOTE — Transfer of Care (Signed)
Immediate Anesthesia Transfer of Care Note  Patient: Tamara Lam  Procedure(s) Performed: COLONOSCOPY WITH PROPOFOL  Patient Location: Endoscopy Unit  Anesthesia Type:General  Level of Consciousness: awake and patient cooperative  Airway & Oxygen Therapy: Patient Spontanous Breathing  Post-op Assessment: Report given to RN and Post -op Vital signs reviewed and stable  Post vital signs: Reviewed and stable  Last Vitals:  Vitals Value Taken Time  BP    Temp    Pulse    Resp    SpO2      Last Pain:  Vitals:   07/08/21 0913  TempSrc:   PainSc: 0-No pain      Patients Stated Pain Goal: 8 (06/89/34 0684)  Complications: No notable events documented.

## 2021-07-10 ENCOUNTER — Encounter (HOSPITAL_COMMUNITY): Payer: Self-pay | Admitting: Internal Medicine

## 2021-07-11 ENCOUNTER — Telehealth: Payer: Self-pay

## 2021-07-11 ENCOUNTER — Other Ambulatory Visit: Payer: Self-pay | Admitting: Psychiatry

## 2021-07-11 DIAGNOSIS — F3341 Major depressive disorder, recurrent, in partial remission: Secondary | ICD-10-CM

## 2021-07-11 DIAGNOSIS — F419 Anxiety disorder, unspecified: Secondary | ICD-10-CM

## 2021-07-11 MED ORDER — ESCITALOPRAM OXALATE 10 MG PO TABS: 10.0000 mg | ORAL_TABLET | Freq: Every day | ORAL | 0 refills | Status: DC

## 2021-07-11 NOTE — Telephone Encounter (Signed)
message was left that need lexapro refilled will not have any to get to her next appt.

## 2021-07-11 NOTE — Telephone Encounter (Signed)
Ordered

## 2021-07-18 DIAGNOSIS — H2511 Age-related nuclear cataract, right eye: Secondary | ICD-10-CM | POA: Diagnosis not present

## 2021-07-18 DIAGNOSIS — H401111 Primary open-angle glaucoma, right eye, mild stage: Secondary | ICD-10-CM | POA: Diagnosis not present

## 2021-07-23 NOTE — Progress Notes (Addendum)
Virtual Visit via Telephone Note  I connected with Tamara Lam on 07/25/21 at  3:30 PM EST by telephone and verified that I am speaking with the correct person using two identifiers.  Location: Patient: home Provider: office Persons participated in the visit- patient, provider    I discussed the limitations, risks, security and privacy concerns of performing an evaluation and management service by telephone and the availability of in person appointments. I also discussed with the patient that there may be a patient responsible charge related to this service. The patient expressed understanding and agreed to proceed.     I discussed the assessment and treatment plan with the patient. The patient was provided an opportunity to ask questions and all were answered. The patient agreed with the plan and demonstrated an understanding of the instructions.   The patient was advised to call back or seek an in-person evaluation if the symptoms worsen or if the condition fails to improve as anticipated.  I provided 16 minutes of non-face-to-face time during this encounter.   Norman Clay, MD    [facility] MD/PA/NP OP Progress Note  07/25/2021 4:06 PM Tamara Lam  MRN:  [id number]  Chief Complaint:  Chief Complaint   Follow-up; Depression    HPI:  This is a follow-up appointment for depression and anxiety.  She states that she does not take quetiapine consistently as she felt sleepy in the morning.  She may take once or twice in the week.  She states that she has been doing good.  She has been busy, and tired as she was taking care of her 67-year-old boy.  She had cataract surgery last Thursday; it went good.  She denies feeling depressed or anxiety.  She sleeps well.  She denies change in appetite or weight.  She has fair concentration.  She denies SI.  She agrees to continue Lexapro at this time.   Daily routine: [employer] three times a week, takes a walk 2-3 times/week, church on Sundays,  greeting/welcoming people Employment: unemployed  Household: son, age 60, her daughter lives nearby Marital status: widowed, her husband deceased in 10/03/2010 Children: 3  She has four sister, one brother  Visit Diagnosis:    ICD-10-CM   1. MDD (major depressive disorder), recurrent, in partial remission ([place])  F33.41     2. Anxiety disorder, unspecified type  F41.9       Past Psychiatric History: Please see initial evaluation for full details. I have reviewed the history. No updates at this time.     Past Medical History:  Past Medical History:  Diagnosis Date   Abnormal CXR (chest x-ray)    Evaluated with Pulmonary- Dr. Luan Pulling- "pt. was told probably due to thin body frame"-saw no issues-PFT test normal.   Anxiety 1980's    Arthritis    osteoarthritis -knees, hands   Chronic constipation    tx. Linzess   Chronic nausea    Glaucoma 2002-10-03   Dr. Venetia Maxon, laser to left eye Sept 2011, bilateral eye drops for this   Helicobacter pylori gastritis 04/2004   last EGD    Hematochezia    Hx of colonoscopy 10/02/05   Dr. Gala Romney / hemorrhoids    Thyroid disease    thyroid goiter, nodules-no problems.   Weight loss     Past Surgical History:  Procedure Laterality Date   ABDOMINAL HYSTERECTOMY     arthoscopic surgery on right knee  10/02/05   BIOPSY  03/03/2018   Procedure: BIOPSY;  Surgeon:  Daneil Dolin, MD;  [facility];  Service: Endoscopy;;  gastric for h. pylori   CHOLECYSTECTOMY  1990's   COLONOSCOPY  2011   Dr. Gala Romney: anal canal/internal hemorrhoids, dimintuive rectosigmoid polyp (polypoid rectal mucosa), few sigmoid diverticula    COLONOSCOPY N/A 10/30/2015   Dr. Gala Romney: diverticulosis, hemorrhoids, surveillance in 2022    COLONOSCOPY WITH PROPOFOL N/A 07/08/2021   Procedure: COLONOSCOPY WITH PROPOFOL;  Surgeon: Daneil Dolin, MD;  [facility];  Service: Endoscopy;  Laterality: N/A;  10:00am   ESOPHAGOGASTRODUODENOSCOPY  2005   Dr. Gala Romney: diffuse  submucosal petechial hemorrhage of te gastric mucosa, some noncompliance of gastric cavity, failure to insufflate fully, multiple gastric biopsies. gastric bx, h.pylori   ESOPHAGOGASTRODUODENOSCOPY N/A 03/03/2018   Dr. Gala Romney: scattered antral erosions/erythema no ulcer/mass. Bx: mild inflammation and No H.pylori   TOTAL KNEE ARTHROPLASTY Right 05/26/2016   Procedure: RIGHT TOTAL KNEE ARTHROPLASTY;  Surgeon: Gaynelle Arabian, MD;  [facility];  Service: Orthopedics;  Laterality: Right;   VESICOVAGINAL FISTULA CLOSURE W/ TAH  1989    Family Psychiatric History: Please see initial evaluation for full details. I have reviewed the history. No updates at this time.     Family History:  Family History  Problem Relation Age of Onset   Hypertension Mother    Glaucoma Mother    Diverticulosis Mother    Heart disease Mother    Lung cancer Father    Mental illness Sister    Depression Sister    Anxiety disorder Sister    Cancer Sister    Depression Sister    Anxiety disorder Sister    Depression Sister    Anxiety disorder Sister    Leukemia Brother        some form of leukemia   Depression Daughter    Colon polyps Son    Colon polyps Son        before age 25   Colon cancer Neg Hx     Social History:  Social History   Socioeconomic History   Marital status: Widowed    Spouse name: Not on file   Number of children: Not on file   Years of education: Not on file   Highest education level: Not on file  Occupational History   Occupation: homemaker     Employer: UNEMPLOYED  Tobacco Use   Smoking status: Never   Smokeless tobacco: Never   Tobacco comments:    Never smoked  Vaping Use   Vaping Use: Never used  Substance and Sexual Activity   Alcohol use: No    Alcohol/week: 0.0 standard drinks   Drug use: No   Sexual activity: Not Currently  Other Topics Concern   Not on file  Social History Narrative   Not on file   Social Determinants of Health   Financial Resource  Strain: Low Risk    Difficulty of Paying Living Expenses: Not hard at all  Food Insecurity: No Food Insecurity   Worried About Charity fundraiser in the Last Year: Never true   [place] in the Last Year: Never true  Transportation Needs: No Transportation Needs   Lack of Transportation (Medical): No   Lack of Transportation (Non-Medical): No  Physical Activity: Sufficiently Active   Days of Exercise per Week: 3 days   Minutes of Exercise per Session: 50 min  Stress: No Stress Concern Present   Feeling of Stress : Not at all  Social Connections: Moderately Isolated  Frequency of Communication with Friends and Family: More than three times a week   Frequency of Social Gatherings with Friends and Family: More than three times a week   Attends Religious Services: More than 4 times per year   Active Member of [employer] or Organizations: No   Attends Archivist Meetings: Never   Marital Status: Widowed    Allergies: No Known Allergies  Metabolic Disorder Labs: Lab Results  Component Value Date   HGBA1C 6.0 (H) 04/16/2021   MPG 114 05/05/2016   No results found for: PROLACTIN Lab Results  Component Value Date   CHOL 172 04/16/2021   TRIG 81 04/16/2021   HDL 54 04/16/2021   CHOLHDL 3.2 04/16/2021   VLDL 13 05/05/2016   LDLCALC 103 (H) 04/16/2021   LDLCALC 106 (H) 07/26/2020   Lab Results  Component Value Date   TSH 0.998 09/04/2020   TSH 1.00 09/04/2020    Therapeutic Level Labs: No results found for: LITHIUM No results found for: VALPROATE No components found for:  CBMZ  Current Medications: Current Outpatient Medications  Medication Sig Dispense Refill   artificial tears (LACRILUBE) OINT ophthalmic ointment Place into the right eye at bedtime as needed for dry eyes. 3.5 g 1   Calcium-Magnesium-Vitamin D (CALCIUM 1200+D3 PO) Take 1 tablet by mouth daily.     escitalopram (LEXAPRO) 10 MG tablet Take 1 tablet (10 mg total) by mouth daily. 30 tablet 0    famotidine (PEPCID) 20 MG tablet Take 20 mg by mouth daily as needed for heartburn.     fluticasone (FLONASE) 50 MCG/ACT nasal spray Use 2 sprays in each nostril once daily (Patient not taking: Reported on 07/02/2021) 16 g 3   LINZESS 290 MCG CAPS capsule TAKE 1 CAPSULE BY MOUTH  DAILY BEFORE BREAKFAST (Patient taking differently: Take 290 mcg by mouth daily as needed (Cosptipation).) 90 capsule 3   montelukast (SINGULAIR) 10 MG tablet Take 10 mg by mouth at bedtime as needed (allergies).     Multiple Vitamins-Minerals (CENTRUM ADULTS) TABS Take 1 tablet by mouth daily.     polyethylene glycol-electrolytes (NULYTELY) 420 g solution As directed 4000 mL 0   QUEtiapine (SEROQUEL) 25 MG tablet Take 1 tablet (25 mg total) by mouth at bedtime. 30 tablet 1   simvastatin (ZOCOR) 20 MG tablet TAKE 1 TABLET BY MOUTH AT  BEDTIME FOR CHOLESTEROL 90 tablet 3   travoprost, benzalkonium, (TRAVATAN) 0.004 % ophthalmic solution Place 1 drop into both eyes at bedtime.     vitamin C (ASCORBIC ACID) 500 MG tablet Take 500 mg by mouth daily.     No current facility-administered medications for this visit.     Musculoskeletal: Strength & Muscle Tone:  N/A Gait & Station:  N/A Patient leans: N/A  Psychiatric Specialty Exam: Review of Systems  Psychiatric/Behavioral:  Negative for agitation, behavioral problems, confusion, decreased concentration, dysphoric mood, hallucinations, self-injury, sleep disturbance and suicidal ideas. The patient is not nervous/anxious and is not hyperactive.   All other systems reviewed and are negative.  There were no vitals taken for this visit.There is no height or weight on file to calculate BMI.  General Appearance: NA  Eye Contact:  NA  Speech:  Clear and Coherent  Volume:  Normal  Mood:   good  Affect:  NA  Thought Process:  Coherent  Orientation:  Full (Time, Place, and Person)  Thought Content: Logical   Suicidal Thoughts:  No  Homicidal Thoughts:  No  Memory:   Immediate;  Good  Judgement:  Good  Insight:  Good  Psychomotor Activity:  Normal  Concentration:  Concentration: Good and Attention Span: Good  Recall:  Good  Fund of Knowledge: Good  Language: Good  Akathisia:  No  Handed:  Right  AIMS (if indicated): not done  Assets:  Communication Skills Desire for Improvement  ADL's:  Intact  Cognition: WNL  Sleep:  Fair   Screenings: GAD-7    [place] Office Visit from 07/04/2019 in [facility] Office Visit from 12/07/2018 in [facility]  Total GAD-7 Score 20 20      PHQ2-9    [place] Visit from 03/27/2021 in [facility] Office Visit from 02/13/2021 in [facility] Office Visit from 02/05/2021 in [facility] from 12/19/2020 in [facility] Visit from 11/22/2020 in [facility]  PHQ-2 Total Score 0 0 0 1 0      Flowsheet Row Admission (Discharged) from 07/08/2021 in [facility] from 02/04/2021 in [facility] Video Visit from 11/06/2020 in [facility] Error: Question 6 not populated No Risk No Risk        Assessment and Plan:  Tamara Lam is a 71 y.o. year old female with a history of depression, anxiety, Nontoxic multinodular goiter, diabetes, who presents for follow up appointment for below.   1. MDD (major depressive disorder), recurrent, in partial remission ([place]) 2. Anxiety disorder, unspecified type She reports overall improvement in her mood symptoms since the last visit.  Psychosocial stressors includes taking care of a boy, who her daughter is fostering.  Other psychosocial stressors includes loss of her husband.  Given she reports improvement in her mood despite taking quetiapine only occasionally, will discontinue this medication to avoid polypharmacy.  Will continue Lexapro to target depression and anxiety.  Noted that she prefers  to see a provider who is closer to her place.  She was advised to discuss with Dr. Moshe Cipro if she would feel comfortable to take over the prescription.  If not, she agrees to have the next visit for in person.    Plan Continue lexapro  10 mg daily (worsening in jitteriness at 15 mg) Discontinue quetiapine Next appointment: 5/11 at 10 AM for 30 mins, in person  Past trials of medication: sertraline (more depressed, anxious), fluoxetine, buspirone,  mirtazapine,  Inderal, valium,    The patient demonstrates the following risk factors for suicide: Chronic risk factors for suicide include: psychiatric disorder of depression, anxiety. Acute risk factors for suicide include: unemployment. Protective factors for this patient include: positive social support, coping skills and hope for the future. Considering these factors, the overall suicide risk at this point appears to be low. Patient is appropriate for outpatient follow up.  Norman Clay, MD 07/25/2021, 4:06 PM

## 2021-07-25 ENCOUNTER — Telehealth (INDEPENDENT_AMBULATORY_CARE_PROVIDER_SITE_OTHER): Payer: Medicare Other | Admitting: Psychiatry

## 2021-07-25 ENCOUNTER — Encounter: Payer: Self-pay | Admitting: Psychiatry

## 2021-07-25 ENCOUNTER — Other Ambulatory Visit: Payer: Self-pay

## 2021-07-25 DIAGNOSIS — F3341 Major depressive disorder, recurrent, in partial remission: Secondary | ICD-10-CM | POA: Diagnosis not present

## 2021-07-25 DIAGNOSIS — F419 Anxiety disorder, unspecified: Secondary | ICD-10-CM | POA: Diagnosis not present

## 2021-07-25 NOTE — Patient Instructions (Signed)
Continue lexapro  10 mg daily  Discontinue quetiapine Next appointment: 5/11 at 10 AM, in person  The next visit will be in person visit. Please arrive 15 mins before the scheduled time.   [facility]  Address: [address], [place], [place] [address]

## 2021-07-30 ENCOUNTER — Encounter: Payer: Medicare Other | Admitting: Family Medicine

## 2021-07-30 ENCOUNTER — Other Ambulatory Visit: Payer: Self-pay | Admitting: Family Medicine

## 2021-07-30 ENCOUNTER — Ambulatory Visit
Admission: RE | Admit: 2021-07-30 | Discharge: 2021-07-30 | Disposition: A | Discharge status: Home / Self Care | Payer: Medicare Other | Source: Ambulatory Visit | Attending: Nurse Practitioner | Admitting: Nurse Practitioner

## 2021-07-30 DIAGNOSIS — N6314 Unspecified lump in the right breast, lower inner quadrant: Secondary | ICD-10-CM | POA: Diagnosis not present

## 2021-07-30 DIAGNOSIS — N644 Mastodynia: Secondary | ICD-10-CM

## 2021-07-30 DIAGNOSIS — N6310 Unspecified lump in the right breast, unspecified quadrant: Secondary | ICD-10-CM

## 2021-07-30 DIAGNOSIS — N6312 Unspecified lump in the right breast, upper inner quadrant: Secondary | ICD-10-CM | POA: Diagnosis not present

## 2021-07-31 ENCOUNTER — Other Ambulatory Visit: Payer: Self-pay

## 2021-07-31 ENCOUNTER — Ambulatory Visit (INDEPENDENT_AMBULATORY_CARE_PROVIDER_SITE_OTHER): Payer: Medicare Other | Admitting: Family Medicine

## 2021-07-31 ENCOUNTER — Encounter: Payer: Self-pay | Admitting: Family Medicine

## 2021-07-31 VITALS — BP 138/87 | HR 72 | Ht 67.0 in | Wt 129.0 lb

## 2021-07-31 DIAGNOSIS — R7303 Prediabetes: Secondary | ICD-10-CM

## 2021-07-31 DIAGNOSIS — E785 Hyperlipidemia, unspecified: Secondary | ICD-10-CM

## 2021-07-31 DIAGNOSIS — R0683 Snoring: Secondary | ICD-10-CM | POA: Insufficient documentation

## 2021-07-31 DIAGNOSIS — Z0000 Encounter for general adult medical examination without abnormal findings: Secondary | ICD-10-CM

## 2021-07-31 MED ORDER — ESCITALOPRAM OXALATE 10 MG PO TABS: 10.0000 mg | ORAL_TABLET | Freq: Every day | ORAL | 5 refills | Status: DC

## 2021-07-31 NOTE — Patient Instructions (Addendum)
F/U in 3.5 months, call if you need me sooner  [facility] get shingrix vaccine #2  I will prescribe lexapro  All the best with breast exam  Labs today, cBC, cmp and eGFR, lipid, HBA1C   It is important that you exercise regularly at least 30 minutes 5 times a week. If you develop chest pain, have severe difficulty breathing, or feel very tired, stop exercising immediately and seek medical attention   Thanks for choosing [facility], we consider it a privelige to serve you.

## 2021-07-31 NOTE — Progress Notes (Signed)
° ° °  Tamara Lam     MRN: [id number]      DOB: [birth year]  HPI: Patient is in for annual physical exam. No other health concerns are expressed or addressed at the visit. Requests that I prescribe med for depression and GAD, dose has bee unchanged for years Has additional imaging of breast following recent mammogram,understandably mildly anxious Recent labs,  are reviewed. Immunization is reviewed , and  updated if needed.   PE: BP 138/87    Pulse 72    Ht 5\' 7"  (1.702 m)    Wt 129 lb (58.5 kg)    SpO2 97%    BMI 20.20 kg/m  Pleasant  female, alert and oriented x 3, in no cardio-pulmonary distress. Afebrile. HEENT No facial trauma or asymetry. Sinuses non tender.  Extra occullar muscles intact.. External ears normal, . Neck: supple, no adenopathy,JVD or thyromegaly.No bruits.  Chest: Clear to ascultation bilaterally.No crackles or wheezes. Non tender to palpation    Cardiovascular system; Heart sounds normal,  S1 and  S2 ,no S3.  No murmur, or thrill. Apical beat not displaced Peripheral pulses normal.  Abdomen: Soft, non tender, no organomegaly or masses. No bruits. Bowel sounds normal. No guarding, tenderness or rebound.   .   Musculoskeletal exam: Full ROM of spine, hips , shoulders and knees. No deformity ,swelling or crepitus noted. No muscle wasting or atrophy.   Neurologic: Cranial nerves 2 to 12 intact. Power, tone ,sensation and reflexes normal throughout. No disturbance in gait. No tremor.  Skin: Intact, no ulceration, erythema , scaling or rash noted. Pigmentation normal throughout  Psych; Normal mood and affect. Judgement and concentration normal   Assessment & Plan:  Annual physical exam Annual exam as documented. Counseling done  re healthy lifestyle involving commitment to 150 minutes exercise per week, heart healthy diet, and attaining healthy weight.The importance of adequate sleep also discussed. Regular seat belt use and home  safety, is also discussed. Changes in health habits are decided on by the patient with goals and time frames  set for achieving them. Immunization and cancer screening needs are specifically addressed at this visit.

## 2021-07-31 NOTE — Assessment & Plan Note (Signed)

## 2021-08-01 LAB — CBC
Hematocrit: 43.2 % (ref 34.0–46.6)
Hemoglobin: 13.7 g/dL (ref 11.1–15.9)
MCH: 27.7 pg (ref 26.6–33.0)
MCHC: 31.7 g/dL (ref 31.5–35.7)
MCV: 87 fL (ref 79–97)
Platelets: 188 10*3/uL (ref 150–450)
RBC: 4.94 x10E6/uL (ref 3.77–5.28)
RDW: 13.2 % (ref 11.7–15.4)
WBC: 5.8 10*3/uL (ref 3.4–10.8)

## 2021-08-01 LAB — CMP14+EGFR
ALT: 19 IU/L (ref 0–32)
AST: 24 IU/L (ref 0–40)
Albumin/Globulin Ratio: 1.8 (ref 1.2–2.2)
Albumin: 4.5 g/dL (ref 3.8–4.8)
Alkaline Phosphatase: 53 IU/L (ref 44–121)
BUN/Creatinine Ratio: 18 (ref 12–28)
BUN: 14 mg/dL (ref 8–27)
Bilirubin Total: 0.5 mg/dL (ref 0.0–1.2)
CO2: 29 mmol/L (ref 20–29)
Calcium: 10.2 mg/dL (ref 8.7–10.3)
Chloride: 102 mmol/L (ref 96–106)
Creatinine, Ser: 0.77 mg/dL (ref 0.57–1.00)
Globulin, Total: 2.5 g/dL (ref 1.5–4.5)
Glucose: 93 mg/dL (ref 70–99)
Potassium: 4.2 mmol/L (ref 3.5–5.2)
Sodium: 144 mmol/L (ref 134–144)
Total Protein: 7 g/dL (ref 6.0–8.5)
eGFR: 83 mL/min/{1.73_m2} (ref >59)

## 2021-08-01 LAB — LIPID PANEL
Chol/HDL Ratio: 2.9 ratio (ref 0.0–4.4)
Cholesterol, Total: 173 mg/dL (ref 100–199)
HDL: 60 mg/dL (ref >39)
LDL Chol Calc (NIH): 96 mg/dL (ref 0–99)
Triglycerides: 96 mg/dL (ref 0–149)
VLDL Cholesterol Cal: 17 mg/dL (ref 5–40)

## 2021-08-01 LAB — HEMOGLOBIN A1C
Est. average glucose Bld gHb Est-mCnc: 128 mg/dL
Hgb A1c MFr Bld: 6.1 % — ABNORMAL HIGH (ref 4.8–5.6)

## 2021-08-04 ENCOUNTER — Encounter: Payer: Self-pay | Admitting: Family Medicine

## 2021-08-07 ENCOUNTER — Other Ambulatory Visit: Payer: Self-pay

## 2021-08-07 ENCOUNTER — Ambulatory Visit
Admission: RE | Admit: 2021-08-07 | Discharge: 2021-08-07 | Disposition: A | Discharge status: Home / Self Care | Payer: Medicare Other | Source: Ambulatory Visit | Attending: Family Medicine | Admitting: Family Medicine

## 2021-08-07 ENCOUNTER — Other Ambulatory Visit: Payer: Self-pay | Admitting: Family Medicine

## 2021-08-07 DIAGNOSIS — N6315 Unspecified lump in the right breast, overlapping quadrants: Secondary | ICD-10-CM | POA: Diagnosis not present

## 2021-08-07 DIAGNOSIS — N644 Mastodynia: Secondary | ICD-10-CM

## 2021-08-07 DIAGNOSIS — N6312 Unspecified lump in the right breast, upper inner quadrant: Secondary | ICD-10-CM | POA: Diagnosis not present

## 2021-08-07 DIAGNOSIS — N6310 Unspecified lump in the right breast, unspecified quadrant: Secondary | ICD-10-CM

## 2021-08-07 DIAGNOSIS — N6011 Diffuse cystic mastopathy of right breast: Secondary | ICD-10-CM | POA: Diagnosis not present

## 2021-08-07 HISTORY — PX: BREAST BIOPSY: SHX20

## 2021-08-26 ENCOUNTER — Other Ambulatory Visit: Payer: Self-pay | Admitting: Family Medicine

## 2021-08-27 ENCOUNTER — Other Ambulatory Visit: Payer: Self-pay | Admitting: Gastroenterology

## 2021-08-27 ENCOUNTER — Telehealth: Payer: Self-pay

## 2021-08-27 DIAGNOSIS — K5909 Other constipation: Secondary | ICD-10-CM

## 2021-08-27 MED ORDER — LINACLOTIDE 290 MCG PO CAPS: 290.0000 ug | ORAL_CAPSULE | Freq: Every day | ORAL | 3 refills | Status: DC

## 2021-08-27 NOTE — Telephone Encounter (Signed)
Refill request was received for Linzess Cap 290 mcg qty:90 [facility]. Last office visit was 05/20/21.

## 2021-08-27 NOTE — Telephone Encounter (Signed)
Rx sent 

## 2021-09-09 ENCOUNTER — Other Ambulatory Visit: Payer: Self-pay

## 2021-09-09 DIAGNOSIS — E042 Nontoxic multinodular goiter: Secondary | ICD-10-CM

## 2021-09-11 DIAGNOSIS — E042 Nontoxic multinodular goiter: Secondary | ICD-10-CM | POA: Diagnosis not present

## 2021-09-12 LAB — T4, FREE: Free T4: 1.1 ng/dL (ref 0.82–1.77)

## 2021-09-12 LAB — TSH: TSH: 0.819 u[IU]/mL (ref 0.450–4.500)

## 2021-09-16 ENCOUNTER — Encounter: Payer: Self-pay | Admitting: "Endocrinology

## 2021-09-16 ENCOUNTER — Other Ambulatory Visit: Payer: Self-pay

## 2021-09-16 ENCOUNTER — Ambulatory Visit: Payer: Medicare Other | Admitting: "Endocrinology

## 2021-09-16 VITALS — BP 98/62 | HR 76 | Ht 67.0 in | Wt 130.2 lb

## 2021-09-16 DIAGNOSIS — M8580 Other specified disorders of bone density and structure, unspecified site: Secondary | ICD-10-CM | POA: Diagnosis not present

## 2021-09-16 DIAGNOSIS — R7303 Prediabetes: Secondary | ICD-10-CM

## 2021-09-16 DIAGNOSIS — E042 Nontoxic multinodular goiter: Secondary | ICD-10-CM | POA: Diagnosis not present

## 2021-09-16 NOTE — Progress Notes (Signed)
?09/16/2021 ? ?Endocrinology follow-up note ? ?Subjective:  ? ? Patient ID: Tamara Lam, female    DOB: 1951/06/03, PCP Fayrene Helper, MD ? ? ?Past Medical History:  ?Diagnosis Date  ? Abnormal CXR (chest x-ray)   ? Evaluated with Pulmonary- Dr. Luan Pulling- "pt. was told probably due to thin body frame"-saw no issues-PFT test normal.  ? Anxiety 1980's   ? Arthritis   ? osteoarthritis -knees, hands  ? Chronic constipation   ? tx. Linzess  ? Chronic nausea   ? Glaucoma 2004  ? Dr. Venetia Maxon, laser to left eye Sept 2011, bilateral eye drops for this  ? Helicobacter pylori gastritis 04/2004  ? last EGD   ? Hematochezia   ? Hx of colonoscopy 08/2005  ? Dr. Gala Romney / hemorrhoids   ? Thyroid disease   ? thyroid goiter, nodules-no problems.  ? Weight loss   ? ?Past Surgical History:  ?Procedure Laterality Date  ? ABDOMINAL HYSTERECTOMY    ? arthoscopic surgery on right knee  2007  ? BIOPSY  03/03/2018  ? Procedure: BIOPSY;  Surgeon: Daneil Dolin, MD;  [facility];  Service: Endoscopy;;  gastric for h. pylori  ? CHOLECYSTECTOMY  1990's  ? COLONOSCOPY  2011  ? Dr. Gala Romney: anal canal/internal hemorrhoids, dimintuive rectosigmoid polyp (polypoid rectal mucosa), few sigmoid diverticula   ? COLONOSCOPY N/A 10/30/2015  ? Dr. Gala Romney: diverticulosis, hemorrhoids, surveillance in 2022   ? COLONOSCOPY WITH PROPOFOL N/A 07/08/2021  ? Procedure: COLONOSCOPY WITH PROPOFOL;  Surgeon: Daneil Dolin, MD;  [facility];  Service: Endoscopy;  Laterality: N/A;  10:00am  ? ESOPHAGOGASTRODUODENOSCOPY  2005  ? Dr. Gala Romney: diffuse submucosal petechial hemorrhage of te gastric mucosa, some noncompliance of gastric cavity, failure to insufflate fully, multiple gastric biopsies. gastric bx, h.pylori  ? ESOPHAGOGASTRODUODENOSCOPY N/A 03/03/2018  ? Dr. Gala Romney: scattered antral erosions/erythema no ulcer/mass. Bx: mild inflammation and No H.pylori  ? TOTAL KNEE ARTHROPLASTY Right 05/26/2016  ? Procedure: RIGHT TOTAL KNEE ARTHROPLASTY;   Surgeon: Gaynelle Arabian, MD;  [facility];  Service: Orthopedics;  Laterality: Right;  ? VESICOVAGINAL FISTULA CLOSURE W/ TAH  1989  ? ?Social History  ? ?Socioeconomic History  ? Marital status: Widowed  ?  Spouse name: Not on file  ? Number of children: Not on file  ? Years of education: Not on file  ? Highest education level: Not on file  ?Occupational History  ? Occupation: homemaker   ?  Employer: UNEMPLOYED  ?Tobacco Use  ? Smoking status: Never  ? Smokeless tobacco: Never  ? Tobacco comments:  ?  Never smoked  ?Vaping Use  ? Vaping Use: Never used  ?Substance and Sexual Activity  ? Alcohol use: No  ?  Alcohol/week: 0.0 standard drinks  ? Drug use: No  ? Sexual activity: Not Currently  ?Other Topics Concern  ? Not on file  ?Social History Narrative  ? Not on file  ? ?Social Determinants of Health  ? ?Financial Resource Strain: Low Risk   ? Difficulty of Paying Living Expenses: Not hard at all  ?Food Insecurity: No Food Insecurity  ? Worried About Charity fundraiser in the Last Year: Never true  ? Ran Out of Food in the Last Year: Never true  ?Transportation Needs: No Transportation Needs  ? Lack of Transportation (Medical): No  ? Lack of Transportation (Non-Medical): No  ?Physical Activity: Sufficiently Active  ? Days of Exercise per Week: 3 days  ? Minutes of Exercise  per Session: 50 min  ?Stress: No Stress Concern Present  ? Feeling of Stress : Not at all  ?Social Connections: Moderately Isolated  ? Frequency of Communication with Friends and Family: More than three times a week  ? Frequency of Social Gatherings with Friends and Family: More than three times a week  ? Attends Religious Services: More than 4 times per year  ? Active Member of Clubs or Organizations: No  ? Attends Archivist Meetings: Never  ? Marital Status: Widowed  ? ?Outpatient Encounter Medications as of 09/16/2021  ?Medication Sig  ? Calcium-Magnesium-Vitamin D (CALCIUM 1200+D3 PO) Take 1 tablet by mouth daily.  ?  escitalopram (LEXAPRO) 10 MG tablet Take 1 tablet (10 mg total) by mouth daily.  ? famotidine (PEPCID) 20 MG tablet Take 20 mg by mouth daily as needed for heartburn.  ? fluticasone (FLONASE) 50 MCG/ACT nasal spray Use 2 sprays in each nostril once daily  ? linaclotide (LINZESS) 290 MCG CAPS capsule Take 1 capsule (290 mcg total) by mouth daily before breakfast. TAKE 1 CAPSULE (290 mcg total) BY MOUTH  DAILY BEFORE BREAKFAST  ? montelukast (SINGULAIR) 10 MG tablet Take 10 mg by mouth at bedtime as needed (allergies).  ? Multiple Vitamins-Minerals (CENTRUM ADULTS) TABS Take 1 tablet by mouth daily.  ? simvastatin (ZOCOR) 20 MG tablet TAKE 1 TABLET BY MOUTH AT  BEDTIME FOR CHOLESTEROL  ? travoprost, benzalkonium, (TRAVATAN) 0.004 % ophthalmic solution Place 1 drop into both eyes at bedtime.  ? vitamin C (ASCORBIC ACID) 500 MG tablet Take 500 mg by mouth daily.  ? [DISCONTINUED] artificial tears (LACRILUBE) OINT ophthalmic ointment Place into the right eye at bedtime as needed for dry eyes.  ? [DISCONTINUED] prednisoLONE acetate (PRED FORTE) 1 % ophthalmic suspension SMARTSIG:In Eye(s)  ? ?No facility-administered encounter medications on file as of 09/16/2021.  ? ?ALLERGIES: ?No Known Allergies ?VACCINATION STATUS: ?Immunization History  ?Administered Date(s) Administered  ? Fluad Quad(high Dose 65+) 03/28/2019, 03/27/2021  ? Influenza Whole 03/13/2010, 03/12/2011  ? Influenza,inj,Quad PF,6+ Mos 04/24/2013, 02/20/2014, 03/27/2015, 04/28/2016, 04/14/2017, 02/24/2018, 02/20/2020  ? Moderna Sars-Covid-2 Vaccination 08/04/2019, 09/02/2019, 05/14/2020, 01/31/2021  ? Pneumococcal Conjugate-13 06/20/2014  ? Pneumococcal Polysaccharide-23 05/12/2016  ? Td 08/16/2009  ? Tdap 12/19/2019  ? Zoster, Live 02/17/2011  ? ? ?HPI ? ?-71 -year-old female patient with medical history as above.  She is here to follow-up for euthyroid multinodular goiter, and well-controlled type 2 diabetes.   ? She is not on any medication for diabetes,  her point-of-care A1c remains stable at 6.1 %.  ? ?She is s/p FNA of thyroid nodule which was benign in 2013, previsit thyroid ultrasound unremarkable for any new findings.  Her thyroid function tests are within normal limits.  She has no new complaints today. ?Her last thyroid ultrasound was unremarkable or stable findings in 2021. ?   She is not on any antithyroid intervention nor thyroid hormone supplement. ?She denies heat/cold intolerance.  She denies any family hx of thyroid dysfunction nor thyroid cancer. she denies exposure to neck radiation. No dysphagia , nor SOB. she has steady weight. ? ?Review of Systems ? ?Constitutional: + Minimally fluctuating body weight,- fatigue, no subjective hyperthermia/hypothermia ? ? ? ?Objective:  ?  ?BP 98/62   Pulse 76   Ht '5\' 7"'$  (1.702 m)   Wt 130 lb 3.2 oz (59.1 kg)   BMI 20.39 kg/m?   ?Wt Readings from Last 3 Encounters:  ?09/16/21 130 lb 3.2 oz (59.1 kg)  ?07/31/21 129  lb (58.5 kg)  ?07/08/21 130 lb (59 kg)  ?  ?Physical Exam ?Constitutional:  + Current BMI of 20.1, not in acute distress, stable state of mind.   ?Eyes: PERRLA, EOMI, no exophthalmos ?ENT: moist mucous membranes, + decrease in thyromegaly, no cervical lymphadenopathy ? ?CMP ( most recent) ?CMP  ?   ?Component Value Date/Time  ? NA 144 07/31/2021 1029  ? K 4.2 07/31/2021 1029  ? CL 102 07/31/2021 1029  ? CO2 29 07/31/2021 1029  ? GLUCOSE 93 07/31/2021 1029  ? GLUCOSE 82 07/25/2016 1451  ? BUN 14 07/31/2021 1029  ? CREATININE 0.77 07/31/2021 1029  ? CREATININE 0.68 07/25/2016 1451  ? CALCIUM 10.2 07/31/2021 1029  ? PROT 7.0 07/31/2021 1029  ? ALBUMIN 4.5 07/31/2021 1029  ? AST 24 07/31/2021 1029  ? ALT 19 07/31/2021 1029  ? ALKPHOS 53 07/31/2021 1029  ? BILITOT 0.5 07/31/2021 1029  ? GFRNONAA 79 07/26/2020 1600  ? GFRNONAA >89 05/05/2016 1012  ? GFRAA 91 07/26/2020 1600  ? GFRAA >89 05/05/2016 1012  ? ? ? ?Diabetic Labs (most recent): ?Lab Results  ?Component Value Date  ? HGBA1C 6.1 (H) 07/31/2021  ?  HGBA1C 6.0 (H) 04/16/2021  ? HGBA1C 5.9 09/13/2020  ? ? ? Lipid Panel ( most recent) ?Lipid Panel  ?   ?Component Value Date/Time  ? CHOL 173 07/31/2021 1029  ? TRIG 96 07/31/2021 1029  ? HDL 60 07/31/2021 1029  ?

## 2021-10-29 ENCOUNTER — Institutional Professional Consult (permissible substitution): Payer: Medicare Other | Admitting: Pulmonary Disease

## 2021-10-30 ENCOUNTER — Encounter: Payer: Self-pay | Admitting: Internal Medicine

## 2021-11-05 NOTE — Progress Notes (Deleted)
[facility] MD/PA/NP OP Progress Note  11/05/2021 5:33 PM Tamara Lam  MRN:  [id number]  Chief Complaint: No chief complaint on file.  HPI: *** Visit Diagnosis: No diagnosis found.  Past Psychiatric History: Please see initial evaluation for full details. I have reviewed the history. No updates at this time.     Past Medical History:  Past Medical History:  Diagnosis Date   Abnormal CXR (chest x-ray)    Evaluated with Pulmonary- Dr. Luan Pulling- "pt. was told probably due to thin body frame"-saw no issues-PFT test normal.   Anxiety 1980's    Arthritis    osteoarthritis -knees, hands   Chronic constipation    tx. Linzess   Chronic nausea    Glaucoma 2004   Dr. Venetia Maxon, laser to left eye Sept 2011, bilateral eye drops for this   Helicobacter pylori gastritis 04/2004   last EGD    Hematochezia    Hx of colonoscopy 08/2005   Dr. Gala Romney / hemorrhoids    Thyroid disease    thyroid goiter, nodules-no problems.   Weight loss     Past Surgical History:  Procedure Laterality Date   ABDOMINAL HYSTERECTOMY     arthoscopic surgery on right knee  2007   BIOPSY  03/03/2018   Procedure: BIOPSY;  Surgeon: Daneil Dolin, MD;  Location: [facility];  Service: Endoscopy;;  gastric for h. pylori   CHOLECYSTECTOMY  1990's   COLONOSCOPY  2011   Dr. Gala Romney: anal canal/internal hemorrhoids, dimintuive rectosigmoid polyp (polypoid rectal mucosa), few sigmoid diverticula    COLONOSCOPY N/A 10/30/2015   Dr. Gala Romney: diverticulosis, hemorrhoids, surveillance in 2022    COLONOSCOPY WITH PROPOFOL N/A 07/08/2021   Procedure: COLONOSCOPY WITH PROPOFOL;  Surgeon: Daneil Dolin, MD;  Location: [facility];  Service: Endoscopy;  Laterality: N/A;  10:00am   ESOPHAGOGASTRODUODENOSCOPY  2005   Dr. Gala Romney: diffuse submucosal petechial hemorrhage of te gastric mucosa, some noncompliance of gastric cavity, failure to insufflate fully, multiple gastric biopsies. gastric bx, h.pylori   ESOPHAGOGASTRODUODENOSCOPY N/A  03/03/2018   Dr. Gala Romney: scattered antral erosions/erythema no ulcer/mass. Bx: mild inflammation and No H.pylori   TOTAL KNEE ARTHROPLASTY Right 05/26/2016   Procedure: RIGHT TOTAL KNEE ARTHROPLASTY;  Surgeon: Gaynelle Arabian, MD;  Location: [facility];  Service: Orthopedics;  Laterality: Right;   VESICOVAGINAL FISTULA CLOSURE W/ TAH  1989    Family Psychiatric History: Please see initial evaluation for full details. I have reviewed the history. No updates at this time.     Family History:  Family History  Problem Relation Age of Onset   Hypertension Mother    Glaucoma Mother    Diverticulosis Mother    Heart disease Mother    Lung cancer Father    Mental illness Sister    Depression Sister    Anxiety disorder Sister    Cancer Sister    Depression Sister    Anxiety disorder Sister    Depression Sister    Anxiety disorder Sister    Leukemia Brother        some form of leukemia   Depression Daughter    Colon polyps Son    Colon polyps Son        before age 42   Colon cancer Neg Hx     Social History:  Social History   Socioeconomic History   Marital status: Widowed    Spouse name: Not on file   Number of children: Not on file   Years of education: Not on  file   Highest education level: Not on file  Occupational History   Occupation: homemaker     Employer: UNEMPLOYED  Tobacco Use   Smoking status: Never   Smokeless tobacco: Never   Tobacco comments:    Never smoked  Vaping Use   Vaping Use: Never used  Substance and Sexual Activity   Alcohol use: No    Alcohol/week: 0.0 standard drinks   Drug use: No   Sexual activity: Not Currently  Other Topics Concern   Not on file  Social History Narrative   Not on file   Social Determinants of Health   Financial Resource Strain: Low Risk    Difficulty of Paying Living Expenses: Not hard at all  Food Insecurity: No Food Insecurity   Worried About Charity fundraiser in the Last Year: Never true   [place] in the  Last Year: Never true  Transportation Needs: No Transportation Needs   Lack of Transportation (Medical): No   Lack of Transportation (Non-Medical): No  Physical Activity: Sufficiently Active   Days of Exercise per Week: 3 days   Minutes of Exercise per Session: 50 min  Stress: No Stress Concern Present   Feeling of Stress : Not at all  Social Connections: Moderately Isolated   Frequency of Communication with Friends and Family: More than three times a week   Frequency of Social Gatherings with Friends and Family: More than three times a week   Attends Religious Services: More than 4 times per year   Active Member of [employer] or Organizations: No   Attends Archivist Meetings: Never   Marital Status: Widowed    Allergies: No Known Allergies  Metabolic Disorder Labs: Lab Results  Component Value Date   HGBA1C 6.1 (H) 07/31/2021   MPG 114 05/05/2016   No results found for: PROLACTIN Lab Results  Component Value Date   CHOL 173 07/31/2021   TRIG 96 07/31/2021   HDL 60 07/31/2021   CHOLHDL 2.9 07/31/2021   VLDL 13 05/05/2016   LDLCALC 96 07/31/2021   LDLCALC 103 (H) 04/16/2021   Lab Results  Component Value Date   TSH 0.819 09/11/2021   TSH 0.998 09/04/2020    Therapeutic Level Labs: No results found for: LITHIUM No results found for: VALPROATE No components found for:  CBMZ  Current Medications: Current Outpatient Medications  Medication Sig Dispense Refill   Calcium-Magnesium-Vitamin D (CALCIUM 1200+D3 PO) Take 1 tablet by mouth daily.     escitalopram (LEXAPRO) 10 MG tablet Take 1 tablet (10 mg total) by mouth daily. 30 tablet 5   famotidine (PEPCID) 20 MG tablet Take 20 mg by mouth daily as needed for heartburn.     fluticasone (FLONASE) 50 MCG/ACT nasal spray Use 2 sprays in each nostril once daily 16 g 3   linaclotide (LINZESS) 290 MCG CAPS capsule Take 1 capsule (290 mcg total) by mouth daily before breakfast. TAKE 1 CAPSULE (290 mcg total) BY MOUTH   DAILY BEFORE BREAKFAST 90 capsule 3   montelukast (SINGULAIR) 10 MG tablet Take 10 mg by mouth at bedtime as needed (allergies).     Multiple Vitamins-Minerals (CENTRUM ADULTS) TABS Take 1 tablet by mouth daily.     simvastatin (ZOCOR) 20 MG tablet TAKE 1 TABLET BY MOUTH AT  BEDTIME FOR CHOLESTEROL 90 tablet 3   travoprost, benzalkonium, (TRAVATAN) 0.004 % ophthalmic solution Place 1 drop into both eyes at bedtime.     vitamin C (ASCORBIC ACID) 500 MG  tablet Take 500 mg by mouth daily.     No current facility-administered medications for this visit.     Musculoskeletal: Strength & Muscle Tone: within normal limits Gait & Station: normal Patient leans: N/A  Psychiatric Specialty Exam: Review of Systems  There were no vitals taken for this visit.There is no height or weight on file to calculate BMI.  General Appearance: Fairly Groomed  Eye Contact:  Good  Speech:  Clear and Coherent  Volume:  Normal  Mood:  {BHH MOOD:22306}  Affect:  {Affect (PAA):22687}  Thought Process:  Coherent  Orientation:  Full (Time, Place, and Person)  Thought Content: Logical   Suicidal Thoughts:  {ST/HT (PAA):22692}  Homicidal Thoughts:  {ST/HT (PAA):22692}  Memory:  Immediate;   Good  Judgement:  {Judgement (PAA):22694}  Insight:  {Insight (PAA):22695}  Psychomotor Activity:  Normal  Concentration:  Concentration: Good and Attention Span: Good  Recall:  Good  Fund of Knowledge: Good  Language: Good  Akathisia:  No  Handed:  Right  AIMS (if indicated): not done  Assets:  Communication Skills Desire for Improvement  ADL's:  Intact  Cognition: WNL  Sleep:  {BHH GOOD/FAIR/POOR:22877}   Screenings: GAD-7    Flowsheet Row Office Visit from 07/04/2019 in [facility] Office Visit from 12/07/2018 in [facility]  Total GAD-7 Score 20 20      PHQ2-9    [place] [place] Visit from 07/31/2021 in [facility] Office Visit from 03/27/2021 in [facility]  Office Visit from 02/13/2021 in [facility] Visit from 02/05/2021 in [facility] from 12/19/2020 in [facility]  PHQ-2 Total Score 2 0 0 0 1  PHQ-9 Total Score 8 -- -- -- --      Flowsheet Row Admission (Discharged) from 07/08/2021 in [facility] from 02/04/2021 in [facility] Video Visit from 11/06/2020 in [facility] Error: Question 6 not populated No Risk No Risk        Assessment and Plan:  Tamara Lam is a 71 y.o. year old female with a history of depression, anxiety, Nontoxic multinodular goiter, diabetes, who presents for follow up appointment for below.    1. MDD (major depressive disorder), recurrent, in partial remission ([place]) 2. Anxiety disorder, unspecified type She reports overall improvement in her mood symptoms since the last visit.  Psychosocial stressors includes taking care of a boy, who her daughter is fostering.  Other psychosocial stressors includes loss of her husband.  Given she reports improvement in her mood despite taking quetiapine only occasionally, will discontinue this medication to avoid polypharmacy.  Will continue Lexapro to target depression and anxiety.  Noted that she prefers to see a provider who is closer to her place.  She was advised to discuss with Dr. Moshe Cipro if she would feel comfortable to take over the prescription.  If not, she agrees to have the next visit for in person.    Plan Continue lexapro  10 mg daily (worsening in jitteriness at 15 mg) Discontinue quetiapine Next appointment: 5/11 at 10 AM for 30 mins, in person   Past trials of medication: sertraline (more depressed, anxious), fluoxetine, buspirone,  mirtazapine,  Inderal, valium,    The patient demonstrates the following risk factors for suicide: Chronic risk factors for suicide include: psychiatric disorder of depression, anxiety. Acute risk  factors for suicide include: unemployment. Protective factors for this patient include: positive social support,  coping skills and hope for the future. Considering these factors, the overall suicide risk at this point appears to be low. Patient is appropriate for outpatient follow up.        Collaboration of Care: Collaboration of Care: {BH OP Collaboration of Care:21014065}  Patient/Guardian was advised Release of Information must be obtained prior to any record release in order to collaborate their care with an outside provider. Patient/Guardian was advised if they have not already done so to contact the registration department to sign all necessary forms in order for [place] to release information regarding their care.   Consent: Patient/Guardian gives verbal consent for treatment and assignment of benefits for services provided during this visit. Patient/Guardian expressed understanding and agreed to proceed.    Norman Clay, MD 11/05/2021, 5:33 PM

## 2021-11-06 ENCOUNTER — Telehealth: Payer: Self-pay | Admitting: Psychiatry

## 2021-11-06 NOTE — Telephone Encounter (Signed)
Patient called to cancel appointment for tomorrow with provider. Stated PCP will be prescribing medications going forward and she does not need to re-schedule. Advised to call back if services are needed and she agreed to do so. ?

## 2021-11-07 ENCOUNTER — Ambulatory Visit: Payer: Medicare Other | Admitting: Psychiatry

## 2021-11-15 DIAGNOSIS — Z961 Presence of intraocular lens: Secondary | ICD-10-CM | POA: Diagnosis not present

## 2021-11-15 DIAGNOSIS — H25812 Combined forms of age-related cataract, left eye: Secondary | ICD-10-CM | POA: Diagnosis not present

## 2021-11-15 DIAGNOSIS — H401131 Primary open-angle glaucoma, bilateral, mild stage: Secondary | ICD-10-CM | POA: Diagnosis not present

## 2021-11-20 ENCOUNTER — Ambulatory Visit (INDEPENDENT_AMBULATORY_CARE_PROVIDER_SITE_OTHER): Payer: Medicare Other | Admitting: Family Medicine

## 2021-11-20 ENCOUNTER — Encounter: Payer: Self-pay | Admitting: Family Medicine

## 2021-11-20 VITALS — BP 124/68 | HR 76 | Resp 16 | Ht 67.0 in | Wt 130.1 lb

## 2021-11-20 DIAGNOSIS — E785 Hyperlipidemia, unspecified: Secondary | ICD-10-CM

## 2021-11-20 DIAGNOSIS — R5383 Other fatigue: Secondary | ICD-10-CM | POA: Diagnosis not present

## 2021-11-20 DIAGNOSIS — M609 Myositis, unspecified: Secondary | ICD-10-CM

## 2021-11-20 DIAGNOSIS — E559 Vitamin D deficiency, unspecified: Secondary | ICD-10-CM

## 2021-11-20 DIAGNOSIS — R058 Other specified cough: Secondary | ICD-10-CM | POA: Diagnosis not present

## 2021-11-20 DIAGNOSIS — R053 Chronic cough: Secondary | ICD-10-CM | POA: Insufficient documentation

## 2021-11-20 DIAGNOSIS — K5909 Other constipation: Secondary | ICD-10-CM | POA: Diagnosis not present

## 2021-11-20 DIAGNOSIS — F324 Major depressive disorder, single episode, in partial remission: Secondary | ICD-10-CM

## 2021-11-20 DIAGNOSIS — R7303 Prediabetes: Secondary | ICD-10-CM | POA: Diagnosis not present

## 2021-11-20 MED ORDER — BENZONATATE 100 MG PO CAPS: 100.0000 mg | ORAL_CAPSULE | Freq: Two times a day (BID) | ORAL | 0 refills | Status: DC | PRN

## 2021-11-20 MED ORDER — MONTELUKAST SODIUM 10 MG PO TABS: 10.0000 mg | ORAL_TABLET | Freq: Every day | ORAL | 3 refills | Status: DC

## 2021-11-20 MED ORDER — FLUTICASONE PROPIONATE 50 MCG/ACT NA SUSP: NASAL | 3 refills | Status: DC

## 2021-11-20 NOTE — Patient Instructions (Signed)
F./u in 8 to 10 weeks, re evaluate cough, muscle pai and finger spasm, call if you need me sooner  Labs today, chem 7 and EGFR, magnesium, cBC and vit d and eSR  Please stop simvastatin, we will see if this reduces cramps  For cough, which is now allergy based, commit to daily montelukast and tessalon perles are prescrinbed for as b needed use for excessive cough  Please get fasting lipid and hBA1C 3 to 5  days before next appointment  Thanks for choosing [facility], we consider it a privelige to serve you.

## 2021-11-20 NOTE — Progress Notes (Signed)
Tamara Lam     MRN: [id number]      DOB: [birth year]   HPI Tamara Lam is here for follow up and re-evaluation of chronic medical conditions, medication management and review of any available recent lab and radiology data.  Preventive health is updated, specifically  Cancer screening and Immunization. Hand pain and spasm x 1 year Bilateral leg cramps and right thigh pain x 1 year, worse in past 6 months, sometimes painful to walk or exercise from muscle pain  attributes this in part to statin, not taking as regularly as prescribed, interested in trial of stopping medication 3 week h/o runny nose and cough gradually improving , no fever or chills ROS Denies recent fever or chills. Denies sinus pressure, nasal congestion, ear pain or sore throat. . Denies chest pains, palpitations and leg swelling Denies abdominal pain, nausea, vomiting,diarrhea or constipation.   Denies dysuria, frequency, hesitancy or incontinence. Denies headaches, seizures, numbness, or tingling. Denies uncontrolled depression, anxiety or insomnia. Denies skin break down or rash.   PE  BP 124/68   Pulse 76   Resp 16   Ht 5' 7" (1.702 m)   Wt 130 lb 1.9 oz (59 kg)   SpO2 95%   BMI 20.38 kg/m   Patient alert and oriented and in no cardiopulmonary distress.  HEENT: No facial asymmetry, EOMI,     Neck supple .  Chest: Clear to auscultation bilaterally.  CVS: S1, S2 no murmurs, no S3.Regular rate.  ABD: Soft non tender.   Ext: No edema  MS: Adequate ROM spine, shoulders, hips and knees.  Skin: Intact, no ulcerations or rash noted.  Psych: Good eye contact, normal affect. Memory intact mildly anxious not depressed appearing.  CNS: CN 2-12 intact, power,  normal throughout.no focal deficits noted.   Assessment & Plan  Allergic cough Uncontrolled , commit to taking allergy med daily and tessalon perles prescribed short term, as needed  Depression, major, recurrent (HCC) Controlled, no change  in medication   Chronic constipation Controlled, no change in medication   Myositis D/c statin and check CK and ESR, monitor symptoms  Prediabetes Patient educated about the importance of limiting  Carbohydrate intake , the need to commit to daily physical activity for a minimum of 30 minutes , and to commit weight loss. The fact that changes in all these areas will reduce or eliminate all together the development of diabetes is stressed.  Updated lab needed at/ before next visit.      Latest Ref Rng & Units 11/20/2021    1:57 PM 07/31/2021   10:29 AM 04/16/2021   10:43 AM 09/13/2020    2:43 PM 07/26/2020    4:00 PM  Diabetic Labs  HbA1c 4.8 - 5.6 %  6.1   6.0   5.9   6.0    Chol 100 - 199 mg/dL  173   172    182    HDL >39 mg/dL  60   54    59    Calc LDL 0 - 99 mg/dL  96   103    106    Triglycerides 0 - 149 mg/dL  96   81    95    Creatinine 0.57 - 1.00 mg/dL 0.71   0.77     0.77        11/20/2021    1:06 PM 09/16/2021    2:07 PM 07/31/2021    9:15 AM 07/08/2021    9:32 AM 07/08/2021  9:30 AM 07/08/2021    8:52 AM 07/08/2021    8:51 AM  BP/Weight  Systolic BP 295 98 188 416 91 606   Diastolic BP 68 62 87 58 49 85   Wt. (Lbs) 130.12 130.2 129    130  BMI 20.38 kg/m2 20.39 kg/m2 20.2 kg/m2    20.36 kg/m2      Latest Ref Rng & Units 04/25/2021   12:00 AM 11/13/2015    1:00 PM  Foot/eye exam completion dates  Eye Exam No Retinopathy No Retinopathy        Foot Form Completion   Done     This result is from an external source.      Hyperlipidemia LDL goal <100 Hyperlipidemia:Low fat diet discussed and encouraged.   Lipid Panel  Lab Results  Component Value Date   CHOL 173 07/31/2021   HDL 60 07/31/2021   LDLCALC 96 07/31/2021   TRIG 96 07/31/2021   CHOLHDL 2.9 07/31/2021     D/c meds due to muscle pain, and f/u in 4 months, needs low fat diet

## 2021-11-21 ENCOUNTER — Other Ambulatory Visit: Payer: Self-pay

## 2021-11-21 DIAGNOSIS — M609 Myositis, unspecified: Secondary | ICD-10-CM

## 2021-11-22 LAB — CBC
Hematocrit: 39.8 % (ref 34.0–46.6)
Hemoglobin: 13.2 g/dL (ref 11.1–15.9)
MCH: 29 pg (ref 26.6–33.0)
MCHC: 33.2 g/dL (ref 31.5–35.7)
MCV: 88 fL (ref 79–97)
Platelets: 280 10*3/uL (ref 150–450)
RBC: 4.55 x10E6/uL (ref 3.77–5.28)
RDW: 13.1 % (ref 11.7–15.4)
WBC: 6.5 10*3/uL (ref 3.4–10.8)

## 2021-11-22 LAB — BMP8+EGFR
BUN/Creatinine Ratio: 18 (ref 12–28)
BUN: 13 mg/dL (ref 8–27)
CO2: 27 mmol/L (ref 20–29)
Calcium: 10.1 mg/dL (ref 8.7–10.3)
Chloride: 101 mmol/L (ref 96–106)
Creatinine, Ser: 0.71 mg/dL (ref 0.57–1.00)
Glucose: 101 mg/dL — ABNORMAL HIGH (ref 70–99)
Potassium: 4.6 mmol/L (ref 3.5–5.2)
Sodium: 144 mmol/L (ref 134–144)
eGFR: 91 mL/min/{1.73_m2} (ref >59)

## 2021-11-22 LAB — MAGNESIUM: Magnesium: 2.4 mg/dL — ABNORMAL HIGH (ref 1.6–2.3)

## 2021-11-22 LAB — VITAMIN D 25 HYDROXY (VIT D DEFICIENCY, FRACTURES): Vit D, 25-Hydroxy: 47.8 ng/mL (ref 30.0–100.0)

## 2021-11-22 LAB — SEDIMENTATION RATE: Sed Rate: 21 mm/hr (ref 0–40)

## 2021-11-24 ENCOUNTER — Encounter: Payer: Self-pay | Admitting: Family Medicine

## 2021-11-24 NOTE — Assessment & Plan Note (Signed)
D/c statin and check CK and ESR, monitor symptoms

## 2021-11-24 NOTE — Assessment & Plan Note (Signed)
Controlled, no change in medication  

## 2021-11-24 NOTE — Assessment & Plan Note (Signed)
Uncontrolled , commit to taking allergy med daily and tessalon perles prescribed short term, as needed

## 2021-11-24 NOTE — Assessment & Plan Note (Signed)
Patient educated about the importance of limiting  Carbohydrate intake , the need to commit to daily physical activity for a minimum of 30 minutes , and to commit weight loss. The fact that changes in all these areas will reduce or eliminate all together the development of diabetes is stressed.  Updated lab needed at/ before next visit.      Latest Ref Rng & Units 11/20/2021    1:57 PM 07/31/2021   10:29 AM 04/16/2021   10:43 AM 09/13/2020    2:43 PM 07/26/2020    4:00 PM  Diabetic Labs  HbA1c 4.8 - 5.6 %  6.1   6.0   5.9   6.0    Chol 100 - 199 mg/dL  173   172    182    HDL >39 mg/dL  60   54    59    Calc LDL 0 - 99 mg/dL  96   103    106    Triglycerides 0 - 149 mg/dL  96   81    95    Creatinine 0.57 - 1.00 mg/dL 0.71   0.77     0.77        11/20/2021    1:06 PM 09/16/2021    2:07 PM 07/31/2021    9:15 AM 07/08/2021    9:32 AM 07/08/2021    9:30 AM 07/08/2021    8:52 AM 07/08/2021    8:51 AM  BP/Weight  Systolic BP 885 98 027 741 91 287   Diastolic BP 68 62 87 58 49 85   Wt. (Lbs) 130.12 130.2 129    130  BMI 20.38 kg/m2 20.39 kg/m2 20.2 kg/m2    20.36 kg/m2      Latest Ref Rng & Units 04/25/2021   12:00 AM 11/13/2015    1:00 PM  Foot/eye exam completion dates  Eye Exam No Retinopathy No Retinopathy        Foot Form Completion   Done     This result is from an external source.

## 2021-11-24 NOTE — Assessment & Plan Note (Signed)
Hyperlipidemia:Low fat diet discussed and encouraged.   Lipid Panel  Lab Results  Component Value Date   CHOL 173 07/31/2021   HDL 60 07/31/2021   LDLCALC 96 07/31/2021   TRIG 96 07/31/2021   CHOLHDL 2.9 07/31/2021     D/c meds due to muscle pain, and f/u in 4 months, needs low fat diet

## 2021-11-28 LAB — CK: Total CK: 95 U/L (ref 32–182)

## 2021-11-28 LAB — SPECIMEN STATUS REPORT

## 2021-12-10 DIAGNOSIS — L239 Allergic contact dermatitis, unspecified cause: Secondary | ICD-10-CM | POA: Diagnosis not present

## 2021-12-10 DIAGNOSIS — L720 Epidermal cyst: Secondary | ICD-10-CM | POA: Diagnosis not present

## 2021-12-10 DIAGNOSIS — D225 Melanocytic nevi of trunk: Secondary | ICD-10-CM | POA: Diagnosis not present

## 2021-12-10 DIAGNOSIS — L814 Other melanin hyperpigmentation: Secondary | ICD-10-CM | POA: Diagnosis not present

## 2021-12-10 DIAGNOSIS — L821 Other seborrheic keratosis: Secondary | ICD-10-CM | POA: Diagnosis not present

## 2021-12-23 ENCOUNTER — Ambulatory Visit (INDEPENDENT_AMBULATORY_CARE_PROVIDER_SITE_OTHER): Payer: Medicare Other

## 2021-12-23 DIAGNOSIS — Z0000 Encounter for general adult medical examination without abnormal findings: Secondary | ICD-10-CM | POA: Diagnosis not present

## 2022-01-19 DIAGNOSIS — H2512 Age-related nuclear cataract, left eye: Secondary | ICD-10-CM | POA: Diagnosis not present

## 2022-01-20 DIAGNOSIS — E785 Hyperlipidemia, unspecified: Secondary | ICD-10-CM | POA: Diagnosis not present

## 2022-01-20 DIAGNOSIS — R7303 Prediabetes: Secondary | ICD-10-CM | POA: Diagnosis not present

## 2022-01-22 ENCOUNTER — Ambulatory Visit (HOSPITAL_COMMUNITY)
Admission: RE | Admit: 2022-01-22 | Discharge: 2022-01-22 | Disposition: A | Discharge status: Home / Self Care | Payer: Medicare Other | Source: Ambulatory Visit | Attending: Family Medicine | Admitting: Family Medicine

## 2022-01-22 ENCOUNTER — Encounter: Payer: Self-pay | Admitting: Family Medicine

## 2022-01-22 ENCOUNTER — Ambulatory Visit (INDEPENDENT_AMBULATORY_CARE_PROVIDER_SITE_OTHER): Payer: Medicare Other | Admitting: Family Medicine

## 2022-01-22 VITALS — BP 126/78 | HR 63 | Resp 16 | Ht 67.0 in | Wt 129.4 lb

## 2022-01-22 DIAGNOSIS — R053 Chronic cough: Secondary | ICD-10-CM | POA: Insufficient documentation

## 2022-01-22 DIAGNOSIS — R202 Paresthesia of skin: Secondary | ICD-10-CM

## 2022-01-22 DIAGNOSIS — L989 Disorder of the skin and subcutaneous tissue, unspecified: Secondary | ICD-10-CM

## 2022-01-22 DIAGNOSIS — L659 Nonscarring hair loss, unspecified: Secondary | ICD-10-CM

## 2022-01-22 DIAGNOSIS — R7989 Other specified abnormal findings of blood chemistry: Secondary | ICD-10-CM

## 2022-01-22 DIAGNOSIS — F3341 Major depressive disorder, recurrent, in partial remission: Secondary | ICD-10-CM

## 2022-01-22 DIAGNOSIS — E785 Hyperlipidemia, unspecified: Secondary | ICD-10-CM | POA: Diagnosis not present

## 2022-01-22 LAB — LIPID PANEL
Chol/HDL Ratio: 3.6 ratio (ref 0.0–4.4)
Cholesterol, Total: 210 mg/dL — ABNORMAL HIGH (ref 100–199)
HDL: 58 mg/dL (ref >39)
LDL Chol Calc (NIH): 138 mg/dL — ABNORMAL HIGH (ref 0–99)
Triglycerides: 77 mg/dL (ref 0–149)
VLDL Cholesterol Cal: 14 mg/dL (ref 5–40)

## 2022-01-22 LAB — HEMOGLOBIN A1C
Est. average glucose Bld gHb Est-mCnc: 123 mg/dL
Hgb A1c MFr Bld: 5.9 % — ABNORMAL HIGH (ref 4.8–5.6)

## 2022-01-22 MED ORDER — SIMVASTATIN 10 MG PO TABS: ORAL_TABLET | ORAL | 3 refills | Status: DC

## 2022-01-22 NOTE — Progress Notes (Unsigned)
   Tamara Lam     MRN: [id number]      DOB: April 09, 1951   HPI Tamara Lam is here with 1 year h/o dry intermittent hacking cough, no fever, chills, not associated with uncontrolled allergy symptoms. Labs are reviewed and will try half dose zocor 3 times weekly Bilateral foot pain, pain and tingling x 6 monhts Hair loss and thinning becoming a problem again  ROS Denies recent fever or chills. Denies sinus pressure, nasal congestion, ear pain or sore throat. Denies chest congestion, productive cough or wheezing. Denies chest pains, palpitations and leg swelling Denies abdominal pain, nausea, vomiting,diarrhea or constipation.   Denies dysuria, frequency, hesitancy or incontinence. Denies depression, anxiety or insomnia. Denies skin break down or rash.   PE  BP 126/78   Pulse 63   Resp 16   Ht '5\' 7"'$  (1.702 m)   Wt 129 lb 6.4 oz (58.7 kg)   SpO2 95%   BMI 20.27 kg/m   Patient alert and oriented and in no cardiopulmonary distress.  HEENT: No facial asymmetry, EOMI,     Neck supple .  Chest: Clear to auscultation bilaterally.  CVS: S1, S2 no murmurs, no S3.Regular rate.  ABD: Soft non tender.   Ext: No edema  MS: Adequate ROM spine, shoulders, hips and knees.  Skin: Intact, hyperpigmented annular lesions on feet, mild bitemporal alopecia.  Psych: Good eye contact, normal affect. Memory intact not anxious or depressed appearing.  CNS: CN 2-12 intact, power,  normal throughout.no focal deficits noted.   Assessment & Plan  Skin lesion Hyperpigmented lesion on distal legs and feet x approx 6 months, refer Derm  Abnormal TSH Followed by endo  Depression, major, recurrent (HCC) Controlled, no change in medication   Alopecia Mild bitemporal, refer dermatology   Tingling of both feet 6 konth h/o hypersensitivity and  tingling of feet, refer Neurology for eval

## 2022-01-22 NOTE — Patient Instructions (Addendum)
F/U in 4 months, call iof you need me sooner  New is simvastatin 10 mg one every Monday , Wednesday and Friday. You may split the 20 mg simvastatin in half and tke half the 20 mg tablet 3 times weekly will you collect the 10 mg tab   CXR at hospital today, please  I will refer you to dr Leonie Green for skin lesions on feet and hair loss/ alopcia  I will refer you to pulmonary re chronic cough  I will refer you to Dr Merlene Laughter re tingling and hypersensitivity in feet  Blood sugar improved  Cholesterol not at goal  Normal kidney  function  Fasting lipid and hepatic panel 1 week before next visit please  It is important that you exercise regularly at least 30 minutes 5 times a week. If you develop chest pain, have severe difficulty breathing, or feel very tired, stop exercising immediately and seek medical attention   Thanks for choosing [facility], we consider it a privelige to serve you.

## 2022-01-23 ENCOUNTER — Encounter: Payer: Self-pay | Admitting: Family Medicine

## 2022-01-23 DIAGNOSIS — H401121 Primary open-angle glaucoma, left eye, mild stage: Secondary | ICD-10-CM | POA: Diagnosis not present

## 2022-01-23 DIAGNOSIS — H2512 Age-related nuclear cataract, left eye: Secondary | ICD-10-CM | POA: Diagnosis not present

## 2022-01-23 DIAGNOSIS — R202 Paresthesia of skin: Secondary | ICD-10-CM | POA: Insufficient documentation

## 2022-01-23 DIAGNOSIS — L659 Nonscarring hair loss, unspecified: Secondary | ICD-10-CM | POA: Insufficient documentation

## 2022-01-23 NOTE — Assessment & Plan Note (Addendum)
6 konth h/o hypersensitivity and  tingling of feet, refer Neurology for eval

## 2022-01-23 NOTE — Assessment & Plan Note (Signed)
Hyperpigmented lesion on distal legs and feet x approx 6 months, refer Derm

## 2022-01-23 NOTE — Assessment & Plan Note (Signed)
Followed by endo.  

## 2022-01-23 NOTE — Assessment & Plan Note (Signed)
Mild bitemporal, refer dermatology

## 2022-01-23 NOTE — Assessment & Plan Note (Signed)
Controlled, no change in medication  

## 2022-01-24 ENCOUNTER — Telehealth: Payer: Self-pay | Admitting: Family Medicine

## 2022-01-24 NOTE — Telephone Encounter (Signed)
Raquel Sarna with [facility] called stating that she only received the coversheet on the referral. She is wanting to know if you can please fax over the notes, reason for referral & insurance info?   Fax # [phone]

## 2022-01-28 ENCOUNTER — Ambulatory Visit (INDEPENDENT_AMBULATORY_CARE_PROVIDER_SITE_OTHER): Payer: Medicare Other | Admitting: Gastroenterology

## 2022-01-28 ENCOUNTER — Encounter: Payer: Self-pay | Admitting: Gastroenterology

## 2022-01-28 VITALS — BP 117/72 | HR 75 | Temp 97.5°F | Ht 67.0 in | Wt 129.6 lb

## 2022-01-28 DIAGNOSIS — K219 Gastro-esophageal reflux disease without esophagitis: Secondary | ICD-10-CM | POA: Diagnosis not present

## 2022-01-28 DIAGNOSIS — K5909 Other constipation: Secondary | ICD-10-CM | POA: Diagnosis not present

## 2022-01-28 NOTE — Patient Instructions (Signed)
Continue Linzess 290 mcg daily as needed for constipation. Try not to go more than 48 hours without a bowel movement. Continue Pepcid twice daily as needed for reflux symptoms.  Monitor bloating. If you have persistent bloating, please let me know and we would consider xray.  Avoid frequent consumption of foods from the "foods to avoid list" below as they can cause increased gas and bloating. Return office visit in 05/2022.  Low-FODMAP Eating Plan  FODMAP stands for fermentable oligosaccharides, disaccharides, monosaccharides, and polyols. These are sugars that are hard for some people to digest. A low-FODMAP eating plan may help some people who have irritable bowel syndrome (IBS) and certain other bowel (intestinal) diseases to manage their symptoms. This meal plan can be complicated to follow. Work with a diet and nutrition specialist (dietitian) to make a low-FODMAP eating plan that is right for you. A dietitian can help make sure that you get enough nutrition from this diet. What are tips for following this plan? Reading food labels Check labels for hidden FODMAPs such as: High-fructose syrup. Honey. Agave. Natural fruit flavors. Onion or garlic powder. Choose low-FODMAP foods that contain 3-4 grams of fiber per serving. Check food labels for serving sizes. Eat only one serving at a time to make sure FODMAP levels stay low. Shopping Shop with a list of foods that are recommended on this diet and make a meal plan. Meal planning Follow a low-FODMAP eating plan for up to 6 weeks, or as told by your health care provider or dietitian. To follow the eating plan: Eliminate high-FODMAP foods from your diet completely. Choose only low-FODMAP foods to eat. You will do this for 2-6 weeks. Gradually reintroduce high-FODMAP foods into your diet one at a time. Most people should wait a few days before introducing the next new high-FODMAP food into their meal plan. Your dietitian can recommend how  quickly you may reintroduce foods. Keep a daily record of what and how much you eat and drink. Make note of any symptoms that you have after eating. Review your daily record with a dietitian regularly to identify which foods you can eat and which foods you should avoid. General tips Drink enough fluid each day to keep your urine pale yellow. Avoid processed foods. These often have added sugar and may be high in FODMAPs. Avoid most dairy products, whole grains, and sweeteners. Work with a dietitian to make sure you get enough fiber in your diet. Avoid high FODMAP foods at meals to manage symptoms. Recommended foods Fruits Bananas, oranges, tangerines, lemons, limes, blueberries, raspberries, strawberries, grapes, cantaloupe, honeydew melon, kiwi, papaya, passion fruit, and pineapple. Limited amounts of dried cranberries, banana chips, and shredded coconut. Vegetables Eggplant, zucchini, cucumber, peppers, green beans, bean sprouts, lettuce, arugula, kale, Swiss chard, spinach, collard greens, bok choy, summer squash, potato, and tomato. Limited amounts of corn, carrot, and sweet potato. Green parts of scallions. Grains Gluten-free grains, such as rice, oats, buckwheat, quinoa, corn, polenta, and millet. Gluten-free pasta, bread, or cereal. Rice noodles. Corn tortillas. Meats and other proteins Unseasoned beef, pork, poultry, or fish. Eggs. Berniece Salines. Tofu (firm) and tempeh. Limited amounts of nuts and seeds, such as almonds, walnuts, [place] nuts, pecans, peanuts, nut butters, pumpkin seeds, chia seeds, and sunflower seeds. Dairy Lactose-free milk, yogurt, and kefir. Lactose-free cottage cheese and ice cream. Non-dairy milks, such as almond, coconut, hemp, and rice milk. Non-dairy yogurt. Limited amounts of goat cheese, brie, mozzarella, parmesan, swiss, and other hard cheeses. Fats and oils Butter-free spreads. Vegetable  oils, such as olive, canola, and sunflower oil. Seasoning and other  foods Artificial sweeteners with names that do not end in "ol," such as aspartame, saccharine, and stevia. Maple syrup, white table sugar, raw sugar, brown sugar, and molasses. Mayonnaise, soy sauce, and tamari. Fresh basil, coriander, parsley, rosemary, and thyme. Beverages Water and mineral water. Sugar-sweetened soft drinks. Small amounts of orange juice or cranberry juice. Black and green tea. Most dry wines. Coffee. The items listed above may not be a complete list of foods and beverages you can eat. Contact a dietitian for more information. Foods to avoid Fruits Fresh, dried, and juiced forms of apple, pear, watermelon, peach, plum, cherries, apricots, blackberries, boysenberries, figs, nectarines, and mango. Avocado. Vegetables Chicory root, artichoke, asparagus, cabbage, snow peas, Brussels sprouts, broccoli, sugar snap peas, mushrooms, celery, and cauliflower. Onions, garlic, leeks, and the white part of scallions. Grains Wheat, including kamut, durum, and semolina. Barley and bulgur. Couscous. Wheat-based cereals. Wheat noodles, bread, crackers, and pastries. Meats and other proteins Fried or fatty meat. Sausage. Cashews and pistachios. Soybeans, baked beans, black beans, chickpeas, kidney beans, fava beans, navy beans, lentils, black-eyed peas, and split peas. Dairy Milk, yogurt, ice cream, and soft cheese. Cream and sour cream. Milk-based sauces. Custard. Buttermilk. Soy milk. Seasoning and other foods Any sugar-free gum or candy. Foods that contain artificial sweeteners such as sorbitol, mannitol, isomalt, or xylitol. Foods that contain honey, high-fructose corn syrup, or agave. Bouillon, vegetable stock, beef stock, and chicken stock. Garlic and onion powder. Condiments made with onion, such as hummus, chutney, pickles, relish, salad dressing, and salsa. Tomato paste. Beverages Chicory-based drinks. Coffee substitutes. Chamomile tea. Fennel tea. Sweet or fortified wines such as port  or sherry. Diet soft drinks made with isomalt, mannitol, maltitol, sorbitol, or xylitol. Apple, pear, and mango juice. Juices with high-fructose corn syrup. The items listed above may not be a complete list of foods and beverages you should avoid. Contact a dietitian for more information. Summary FODMAP stands for fermentable oligosaccharides, disaccharides, monosaccharides, and polyols. These are sugars that are hard for some people to digest. A low-FODMAP eating plan is a short-term diet that helps to ease symptoms of certain bowel diseases. The eating plan usually lasts up to 6 weeks. After that, high-FODMAP foods are reintroduced gradually and one at a time. This can help you find out which foods may be causing symptoms. A low-FODMAP eating plan can be complicated. It is best to work with a dietitian who has experience with this type of plan. This information is not intended to replace advice given to you by your health care provider. Make sure you discuss any questions you have with your health care provider. Document Revised: 11/03/2019 Document Reviewed: 11/03/2019 Elsevier Patient Education  [place].

## 2022-01-28 NOTE — Progress Notes (Signed)
GI Office Note    Referring Provider: Fayrene Helper, MD Primary Care Physician:  Fayrene Helper, MD  Primary Gastroenterologist: Garfield Cornea, MD   Chief Complaint   Chief Complaint  Patient presents with   Follow-up    Still has occasional issues with reflux and is having to use miralax and stool softeners along with linzess for her constipation.     History of Present Illness   Tamara Lam is a 71 y.o. female presenting today for follow up. She was last seen in our office in 04/2021. History of GERD, constipation.   Since her last visit, she completed colonoscopy as outlined bewlo. She is having occasional reflux issues. She uses Pepcid as needed for indigestion or if she plans to eat a possible trigger food. Overall doing well. No dysphagia, abdominal pain, vomiting. Her constipation overall doing ok. She avoids bread/wheat due to constipation. She typically uses Miralax and/or stool softener to maintain regular bowels and using Linzess only when she feels she needs it. She complains of some abdominal bloating. Intermittent in nature. Possibly worse after certain foods.   Colonoscopy 06/2021: -non-bleeding internal hemorrhoids -Diverticulosis in the entire examined colon. -The examination was otherwise normal on direct and retroflexion views. - No specimens collected. -next colonoscopy in five years.  EGD 02/2018: -scattered antral erosions/erythema -bx with mild inflammation, no h.pylori  Medications   Current Outpatient Medications  Medication Sig Dispense Refill   Calcium-Magnesium-Vitamin D (CALCIUM 1200+D3 PO) Take 1 tablet by mouth daily.     escitalopram (LEXAPRO) 10 MG tablet Take 1 tablet (10 mg total) by mouth daily. 30 tablet 5   famotidine (PEPCID) 20 MG tablet Take 20 mg by mouth daily as needed for heartburn.     fluticasone (FLONASE) 50 MCG/ACT nasal spray Use 2 sprays in each nostril once daily 16 g 3   linaclotide (LINZESS) 290 MCG CAPS  capsule Take 1 capsule (290 mcg total) by mouth daily before breakfast. TAKE 1 CAPSULE (290 mcg total) BY MOUTH  DAILY BEFORE BREAKFAST 90 capsule 3   montelukast (SINGULAIR) 10 MG tablet Take 1 tablet (10 mg total) by mouth at bedtime. 30 tablet 3   Multiple Vitamins-Minerals (CENTRUM ADULTS) TABS Take 1 tablet by mouth daily.     simvastatin (ZOCOR) 10 MG tablet Take one tablet by mouth every Monday, Wednesday and Friday 36 tablet 3   travoprost, benzalkonium, (TRAVATAN) 0.004 % ophthalmic solution Place 1 drop into both eyes at bedtime.     vitamin C (ASCORBIC ACID) 500 MG tablet Take 500 mg by mouth daily.     No current facility-administered medications for this visit.    Allergies   Allergies as of 01/28/2022   (No Known Allergies)       Review of Systems   General: Negative for anorexia, weight loss, fever, chills, fatigue, weakness. ENT: Negative for hoarseness, difficulty swallowing , nasal congestion. CV: Negative for chest pain, angina, palpitations, dyspnea on exertion, peripheral edema.  Respiratory: Negative for dyspnea at rest, dyspnea on exertion, cough, sputum, wheezing.  GI: See history of present illness. GU:  Negative for dysuria, hematuria, urinary incontinence, urinary frequency, nocturnal urination.  Endo: Negative for unusual weight change.     Physical Exam   BP 117/72 (BP Location: Right Arm, Patient Position: Sitting, Cuff Size: Normal)   Pulse 75   Temp (!) 97.5 F (36.4 C) (Temporal)   Ht '5\' 7"'$  (1.702 m)   Wt 129 lb 9.6 oz (58.8  kg)   SpO2 97%   BMI 20.30 kg/m    General: Well-nourished, well-developed in no acute distress.  Eyes: No icterus. Mouth: Oropharyngeal mucosa moist and pink , no lesions erythema or exudate. Lungs: Clear to auscultation bilaterally.  Heart: Regular rate and rhythm, no murmurs rubs or gallops.  Abdomen: Bowel sounds are normal, nontender, nondistended, no hepatosplenomegaly or masses,  no abdominal bruits or hernia ,  no rebound or guarding.  Rectal: not performed Extremities: No lower extremity edema. No clubbing or deformities. Neuro: Alert and oriented x 4   Skin: Warm and dry, no jaundice.   Psych: Alert and cooperative, normal mood and affect.  Labs   Lab Results  Component Value Date   HGBA1C 5.9 (H) 01/20/2022   Lab Results  Component Value Date   CREATININE 0.71 11/20/2021   BUN 13 11/20/2021   NA 144 11/20/2021   K 4.6 11/20/2021   CL 101 11/20/2021   CO2 27 11/20/2021   Lab Results  Component Value Date   ALT 19 07/31/2021   AST 24 07/31/2021   ALKPHOS 53 07/31/2021   BILITOT 0.5 07/31/2021   Lab Results  Component Value Date   WBC 6.5 11/20/2021   HGB 13.2 11/20/2021   HCT 39.8 11/20/2021   MCV 88 11/20/2021   PLT 280 11/20/2021    Imaging Studies   DG Chest 2 View  Result Date: 01/23/2022 CLINICAL DATA:  Chronic cough. EXAM: CHEST - 2 VIEW COMPARISON:  07/28/2017 FINDINGS: Midline trachea. Normal heart size and mediastinal contours. No pleural effusion or pneumothorax. Clear lungs. IMPRESSION: No active cardiopulmonary disease. Electronically Signed   By: Abigail Miyamoto M.D.   On: 01/23/2022 17:04    Assessment   Constipation: using miralax and stool softeners first line as she is hesitant to take Linzess daily due to her activities. Encouraged her to not go more than 48 hours without a BM, should use Linzess often enough to maintain regularity.  GERD: overall doing well with prn Pepcid.  Bloating: likely digestive related. Initially manage constipation, limit high FODMAP foods.    PLAN   Continue Linzess 290 mcg daily as needed for constipation. Try not to go more than 48 hours without a bowel movement. Continue Pepcid twice daily as needed for reflux symptoms.  Monitor bloating. If you have persistent bloating, please let me know and we would consider xray.  Avoid frequent consumption of foods from the "foods to avoid list" below as they can cause increased gas  and bloating. Return office visit in 05/2022.  Laureen Ochs. Bobby Rumpf, [place], [facility]

## 2022-02-10 ENCOUNTER — Other Ambulatory Visit: Payer: Self-pay | Admitting: Family Medicine

## 2022-02-10 DIAGNOSIS — Z712 Person consulting for explanation of examination or test findings: Secondary | ICD-10-CM

## 2022-02-13 ENCOUNTER — Other Ambulatory Visit: Payer: Self-pay | Admitting: Family Medicine

## 2022-02-17 ENCOUNTER — Ambulatory Visit: Payer: Medicare Other | Admitting: Pulmonary Disease

## 2022-02-17 ENCOUNTER — Encounter: Payer: Self-pay | Admitting: Pulmonary Disease

## 2022-02-17 VITALS — BP 128/86 | HR 66 | Temp 97.7°F | Ht 67.0 in | Wt 130.0 lb

## 2022-02-17 DIAGNOSIS — R053 Chronic cough: Secondary | ICD-10-CM | POA: Diagnosis not present

## 2022-02-17 DIAGNOSIS — R0683 Snoring: Secondary | ICD-10-CM

## 2022-02-17 NOTE — Patient Instructions (Signed)
X home sleep test  X OTC chlorpheniramine 4 mg at bedtime x 2 weeks for drainage

## 2022-02-17 NOTE — Assessment & Plan Note (Signed)
Med review does not show any culprits.  Onset after a URI in 09/2021.  Could possibly be postnasal drip or reflux, no reason to suspect cough variant asthma. We will trial chlorpheniramine for 2 weeks nightly

## 2022-02-17 NOTE — Progress Notes (Signed)
Subjective:    Patient ID: Tamara Lam, female    DOB: 1951/02/17, 71 y.o.   MRN: [id number]  HPI  Chief Complaint  Patient presents with   Consult    Sleep consult ref by Dr. Alessandra Grout up feeling tired and snores    71 year old never smoker referred for evaluation of hypersomnolence and chronic cough.  She reports cough with onset after an URI 09/2021.  She developed chest congestion, received medications from PCP.  Chest x-ray 12/2021 is noted to be clear.  She reports persistent sinus drainage and a tickle in her throat.  She denies reflux.  Med review does not reveal any culprits  She reports nonrefreshing sleep, feels tired in the morning even if she slept for more than 8 hours.  Snoring has been noted by family members.  She reports a sleep study in the remote past at [facility].  Epworth sleepiness score is 11 and she reports sleepiness as a passenger in a car, while sitting and reading or lying down to rest in the afternoons. Bedtime is between 11 PM and midnight, sleep latency about 30 minutes, she sleeps on her side or on her stomach with 1 pillow, reports 2-3 nocturnal awakenings including nocturia and is out of bed between 830 and 11 AM with dryness of mouth and denies headaches. She reports occasional naps for about 1 hour in the recliner but these are not refreshing. There is no history suggestive of cataplexy, sleep paralysis or parasomnias She lives at home with her son, no bed partner sleep history is available  08/2021 TSH was normal   Past Medical History:  Diagnosis Date   Abnormal CXR (chest x-ray)    Evaluated with Pulmonary- Dr. Luan Pulling- "pt. was told probably due to thin body frame"-saw no issues-PFT test normal.   Anxiety 1980's    Arthritis    osteoarthritis -knees, hands   Chronic constipation    tx. Linzess   Chronic nausea    Glaucoma 2004   Dr. Venetia Maxon, laser to left eye Sept 2011, bilateral eye drops for this   Helicobacter pylori gastritis  04/2004   last EGD    Hematochezia    Hx of colonoscopy 08/2005   Dr. Gala Romney / hemorrhoids    Thyroid disease    thyroid goiter, nodules-no problems.   Weight loss    Past Surgical History:  Procedure Laterality Date   ABDOMINAL HYSTERECTOMY     arthoscopic surgery on right knee  2007   BIOPSY  03/03/2018   Procedure: BIOPSY;  Surgeon: Daneil Dolin, MD;  Location: [facility];  Service: Endoscopy;;  gastric for h. pylori   CHOLECYSTECTOMY  1990's   COLONOSCOPY  2011   Dr. Gala Romney: anal canal/internal hemorrhoids, dimintuive rectosigmoid polyp (polypoid rectal mucosa), few sigmoid diverticula    COLONOSCOPY N/A 10/30/2015   Dr. Gala Romney: diverticulosis, hemorrhoids, surveillance in 2022    COLONOSCOPY WITH PROPOFOL N/A 07/08/2021   Procedure: COLONOSCOPY WITH PROPOFOL;  Surgeon: Daneil Dolin, MD;  Location: [facility];  Service: Endoscopy;  Laterality: N/A;  10:00am   ESOPHAGOGASTRODUODENOSCOPY  2005   Dr. Gala Romney: diffuse submucosal petechial hemorrhage of te gastric mucosa, some noncompliance of gastric cavity, failure to insufflate fully, multiple gastric biopsies. gastric bx, h.pylori   ESOPHAGOGASTRODUODENOSCOPY N/A 03/03/2018   Dr. Gala Romney: scattered antral erosions/erythema no ulcer/mass. Bx: mild inflammation and No H.pylori   TOTAL KNEE ARTHROPLASTY Right 05/26/2016   Procedure: RIGHT TOTAL KNEE ARTHROPLASTY;  Surgeon: Gaynelle Arabian,  MD;  Location: [facility];  Service: Orthopedics;  Laterality: Right;   VESICOVAGINAL FISTULA CLOSURE W/ TAH  1989    No Known Allergies  Social History   Socioeconomic History   Marital status: Widowed    Spouse name: Not on file   Number of children: Not on file   Years of education: Not on file   Highest education level: Not on file  Occupational History   Occupation: homemaker     Employer: UNEMPLOYED  Tobacco Use   Smoking status: Never   Smokeless tobacco: Never   Tobacco comments:    Never smoked  Vaping Use   Vaping Use: Never used   Substance and Sexual Activity   Alcohol use: No    Alcohol/week: 0.0 standard drinks of alcohol   Drug use: No   Sexual activity: Not Currently  Other Topics Concern   Not on file  Social History Narrative   Not on file   Social Determinants of Health   Financial Resource Strain: Low Risk  (12/19/2020)   Overall Financial Resource Strain (CARDIA)    Difficulty of Paying Living Expenses: Not hard at all  Food Insecurity: No Food Insecurity (12/19/2020)   Hunger Vital Sign    Worried About Running Out of Food in the Last Year: Never true    [place] in the Last Year: Never true  Transportation Needs: No Transportation Needs (12/19/2020)   PRAPARE - Hydrologist (Medical): No    Lack of Transportation (Non-Medical): No  Physical Activity: Sufficiently Active (12/19/2020)   Exercise Vital Sign    Days of Exercise per Week: 3 days    Minutes of Exercise per Session: 50 min  Stress: No Stress Concern Present (12/19/2020)   [place]    Feeling of Stress : Not at all  Social Connections: Moderately Isolated (12/19/2020)   Social Connection and Isolation Panel [NHANES]    Frequency of Communication with Friends and Family: More than three times a week    Frequency of Social Gatherings with Friends and Family: More than three times a week    Attends Religious Services: More than 4 times per year    Active Member of [employer] or Organizations: No    Attends Archivist Meetings: Never    Marital Status: Widowed  Intimate Partner Violence: Not At Risk (12/19/2020)   Humiliation, Afraid, Rape, and Kick questionnaire    Fear of Current or Ex-Partner: No    Emotionally Abused: No    Physically Abused: No    Sexually Abused: No    Family History  Problem Relation Age of Onset   Hypertension Mother    Glaucoma Mother    Diverticulosis Mother    Heart disease Mother    Lung cancer  Father    Mental illness Sister    Depression Sister    Anxiety disorder Sister    Cancer Sister    Depression Sister    Anxiety disorder Sister    Depression Sister    Anxiety disorder Sister    Leukemia Brother        some form of leukemia   Depression Daughter    Colon polyps Son    Colon polyps Son        before age 22   Colon cancer Neg Hx       Review of Systems Nonproductive cough Nasal congestion Acid heartburn Anxiety and depression Joint  stiffness  Constitutional: negative for anorexia, fevers and sweats  Eyes: negative for irritation, redness and visual disturbance  Ears, nose, mouth, throat, and face: negative for earaches, epistaxis, nasal congestion and sore throat  Respiratory: negative for  dyspnea on exertion, sputum and wheezing  Cardiovascular: negative for chest pain, dyspnea, lower extremity edema, orthopnea, palpitations and syncope  Gastrointestinal: negative for abdominal pain, constipation, diarrhea, melena, nausea and vomiting  Genitourinary:negative for dysuria, frequency and hematuria  Hematologic/lymphatic: negative for bleeding, easy bruising and lymphadenopathy  Musculoskeletal:negative for arthralgias, muscle weakness  Neurological: negative for coordination problems, gait problems, headaches and weakness  Endocrine: negative for diabetic symptoms including polydipsia, polyuria and weight loss      Objective:   Physical Exam  Gen. Pleasant, well-nourished, elderly,in no distress, normal affect ENT - no pallor,icterus, no post nasal drip Neck: No JVD, no thyromegaly, no carotid bruits Lungs: no use of accessory muscles, no dullness to percussion, clear without rales or rhonchi  Cardiovascular: Rhythm regular, heart sounds  normal, no murmurs or gallops, no peripheral edema Abdomen: soft and non-tender, no hepatosplenomegaly, BS normal. Musculoskeletal: No deformities, no cyanosis or clubbing Neuro:  alert, non focal        Assessment & Plan:

## 2022-02-17 NOTE — Assessment & Plan Note (Signed)
Given excessive daytime somnolence, narrow pharyngeal exam & loud snoring, obstructive sleep apnea is very likely & an overnight polysomnogram will be scheduled as a home study. The pathophysiology of obstructive sleep apnea , it's cardiovascular consequences & modes of treatment including CPAP were discused with the patient in detail & they evidenced understanding.  Pretest probability is low

## 2022-02-26 DIAGNOSIS — M1A0711 Idiopathic chronic gout, right ankle and foot, with tophus (tophi): Secondary | ICD-10-CM | POA: Diagnosis not present

## 2022-02-26 DIAGNOSIS — E1142 Type 2 diabetes mellitus with diabetic polyneuropathy: Secondary | ICD-10-CM | POA: Diagnosis not present

## 2022-02-26 DIAGNOSIS — Z79899 Other long term (current) drug therapy: Secondary | ICD-10-CM | POA: Diagnosis not present

## 2022-02-27 DIAGNOSIS — Z79899 Other long term (current) drug therapy: Secondary | ICD-10-CM | POA: Diagnosis not present

## 2022-03-06 ENCOUNTER — Ambulatory Visit
Admission: RE | Admit: 2022-03-06 | Discharge: 2022-03-06 | Disposition: A | Discharge status: Home / Self Care | Payer: Medicare Other | Source: Ambulatory Visit | Attending: Family Medicine | Admitting: Family Medicine

## 2022-03-06 DIAGNOSIS — Z1231 Encounter for screening mammogram for malignant neoplasm of breast: Secondary | ICD-10-CM | POA: Diagnosis not present

## 2022-03-06 DIAGNOSIS — Z712 Person consulting for explanation of examination or test findings: Secondary | ICD-10-CM

## 2022-03-17 ENCOUNTER — Other Ambulatory Visit: Payer: Self-pay | Admitting: Family Medicine

## 2022-03-19 ENCOUNTER — Ambulatory Visit (INDEPENDENT_AMBULATORY_CARE_PROVIDER_SITE_OTHER): Payer: Medicare Other

## 2022-03-19 DIAGNOSIS — Z23 Encounter for immunization: Secondary | ICD-10-CM

## 2022-03-21 ENCOUNTER — Telehealth: Payer: Self-pay | Admitting: Family Medicine

## 2022-03-21 NOTE — Telephone Encounter (Signed)
Pt called stating she is still having difficulty with her feet & toes. Wants to know if she can please be referred to podiatry. [place] if possible.

## 2022-03-25 NOTE — Telephone Encounter (Signed)
Called pt no answer °

## 2022-03-25 NOTE — Telephone Encounter (Signed)
Called patient and left message for them to return call at the office  Needing to know what issues she is having with her feet and toes that need referral to podiatry

## 2022-03-26 ENCOUNTER — Ambulatory Visit: Payer: Medicare Other

## 2022-03-26 DIAGNOSIS — G4733 Obstructive sleep apnea (adult) (pediatric): Secondary | ICD-10-CM

## 2022-03-26 DIAGNOSIS — R0683 Snoring: Secondary | ICD-10-CM

## 2022-03-26 NOTE — Telephone Encounter (Signed)
States she has been having a lot of pain in her feet and big toes. Thinks she may have ingrown toenails and wants to be referred to podiatry. Ok to refer?

## 2022-03-27 ENCOUNTER — Other Ambulatory Visit: Payer: Self-pay | Admitting: Family Medicine

## 2022-03-27 DIAGNOSIS — M79672 Pain in left foot: Secondary | ICD-10-CM

## 2022-03-27 DIAGNOSIS — M79671 Pain in right foot: Secondary | ICD-10-CM

## 2022-03-27 NOTE — Telephone Encounter (Signed)
Referral entered  

## 2022-03-28 ENCOUNTER — Telehealth: Payer: Self-pay | Admitting: Pulmonary Disease

## 2022-03-28 DIAGNOSIS — G4733 Obstructive sleep apnea (adult) (pediatric): Secondary | ICD-10-CM | POA: Diagnosis not present

## 2022-03-28 NOTE — Telephone Encounter (Signed)
Spoke with patient  regarding hst recommendations and results. They verbalized understanding. No further questions.  Nothing further needed at this time.

## 2022-03-28 NOTE — Telephone Encounter (Signed)
HST showed mild OSA with AHI 11/ hr TST was 10.5 h   Options include CPAP or dental appliance or simply avoiding sleeping on her back OV to discuss options -next available ok. Meantime, she can just avoid sleeping supine & we can assess how she is doing at OV

## 2022-04-08 ENCOUNTER — Ambulatory Visit: Payer: Medicare Other | Admitting: Podiatry

## 2022-04-08 ENCOUNTER — Ambulatory Visit (INDEPENDENT_AMBULATORY_CARE_PROVIDER_SITE_OTHER): Payer: Medicare Other

## 2022-04-08 DIAGNOSIS — L603 Nail dystrophy: Secondary | ICD-10-CM | POA: Diagnosis not present

## 2022-04-08 DIAGNOSIS — M778 Other enthesopathies, not elsewhere classified: Secondary | ICD-10-CM

## 2022-04-08 DIAGNOSIS — M79671 Pain in right foot: Secondary | ICD-10-CM

## 2022-04-08 DIAGNOSIS — B351 Tinea unguium: Secondary | ICD-10-CM

## 2022-04-08 NOTE — Progress Notes (Signed)
Subjective:   Patient ID: Tamara Lam, female   DOB: 71 y.o.   MRN: [id number]   HPI Chief Complaint  Patient presents with   Ingrown Toenail    Right hallux ingrown bilateral borders, started 4 months ago, NO swelling or drainage, has pain when wearing shoes, left hallux fungus, pain on lateral side of the foot, and swelling  X-Rays done today     It is sore on the right medial nail border with pressure and shoes. She has been getting pedicures and they hel with the ingrown but now the medial aspect of the right big toe is sore. It has not beendrinage or swelling  She is not sure if the left fit big toe has funugs. The toenail is not smooth and it is rough. No pain.  On the right lateral ankle she get some pain every now and then then it will get red and swell and she cannot walk on it. It happened last July and again this July. No injuries. She is pointing to the sinus tarsi.  No pain currently.  Review of Systems  All other systems reviewed and are negative.  Past Medical History:  Diagnosis Date   Abnormal CXR (chest x-ray)    Evaluated with Pulmonary- Dr. Luan Pulling- "pt. was told probably due to thin body frame"-saw no issues-PFT test normal.   Anxiety 1980's    Arthritis    osteoarthritis -knees, hands   Chronic constipation    tx. Linzess   Chronic nausea    Glaucoma 2004   Dr. Venetia Maxon, laser to left eye Sept 2011, bilateral eye drops for this   Helicobacter pylori gastritis 04/2004   last EGD    Hematochezia    Hx of colonoscopy 08/2005   Dr. Gala Romney / hemorrhoids    Thyroid disease    thyroid goiter, nodules-no problems.   Weight loss     Past Surgical History:  Procedure Laterality Date   ABDOMINAL HYSTERECTOMY     arthoscopic surgery on right knee  2007   BIOPSY  03/03/2018   Procedure: BIOPSY;  Surgeon: Daneil Dolin, MD;  Location: [facility];  Service: Endoscopy;;  gastric for h. pylori   CHOLECYSTECTOMY  1990's   COLONOSCOPY  2011   Dr. Gala Romney: anal  canal/internal hemorrhoids, dimintuive rectosigmoid polyp (polypoid rectal mucosa), few sigmoid diverticula    COLONOSCOPY N/A 10/30/2015   Dr. Gala Romney: diverticulosis, hemorrhoids, surveillance in 2022    COLONOSCOPY WITH PROPOFOL N/A 07/08/2021   Procedure: COLONOSCOPY WITH PROPOFOL;  Surgeon: Daneil Dolin, MD;  Location: [facility];  Service: Endoscopy;  Laterality: N/A;  10:00am   ESOPHAGOGASTRODUODENOSCOPY  2005   Dr. Gala Romney: diffuse submucosal petechial hemorrhage of te gastric mucosa, some noncompliance of gastric cavity, failure to insufflate fully, multiple gastric biopsies. gastric bx, h.pylori   ESOPHAGOGASTRODUODENOSCOPY N/A 03/03/2018   Dr. Gala Romney: scattered antral erosions/erythema no ulcer/mass. Bx: mild inflammation and No H.pylori   TOTAL KNEE ARTHROPLASTY Right 05/26/2016   Procedure: RIGHT TOTAL KNEE ARTHROPLASTY;  Surgeon: Gaynelle Arabian, MD;  Location: [facility];  Service: Orthopedics;  Laterality: Right;   VESICOVAGINAL FISTULA CLOSURE W/ TAH  1989     Current Outpatient Medications:    Calcium-Magnesium-Vitamin D (CALCIUM 1200+D3 PO), Take 1 tablet by mouth daily., Disp: , Rfl:    escitalopram (LEXAPRO) 10 MG tablet, TAKE (1) TABLET BY MOUTH ONCE DAILY., Disp: 30 tablet, Rfl: 0   famotidine (PEPCID) 20 MG tablet, Take 20 mg by mouth daily as needed  for heartburn., Disp: , Rfl:    fluticasone (FLONASE) 50 MCG/ACT nasal spray, Use 2 sprays in each nostril once daily, Disp: 16 g, Rfl: 3   linaclotide (LINZESS) 290 MCG CAPS capsule, Take 1 capsule (290 mcg total) by mouth daily before breakfast. TAKE 1 CAPSULE (290 mcg total) BY MOUTH  DAILY BEFORE BREAKFAST, Disp: 90 capsule, Rfl: 3   montelukast (SINGULAIR) 10 MG tablet, Take 1 tablet (10 mg total) by mouth at bedtime., Disp: 30 tablet, Rfl: 3   Multiple Vitamins-Minerals (CENTRUM ADULTS) TABS, Take 1 tablet by mouth daily., Disp: , Rfl:    simvastatin (ZOCOR) 10 MG tablet, Take one tablet by mouth every Monday, Wednesday and  Friday, Disp: 36 tablet, Rfl: 3   travoprost, benzalkonium, (TRAVATAN) 0.004 % ophthalmic solution, Place 1 drop into both eyes at bedtime., Disp: , Rfl:    vitamin C (ASCORBIC ACID) 500 MG tablet, Take 500 mg by mouth daily., Disp: , Rfl:   No Known Allergies         Objective:  Physical Exam  General: AAO x3, NAD  Dermatological: There is only dystrophic discolored tenderness incurvation present hallux toenails.  There is yellow discoloration mostly to the left hallux.  No edema, erythema.  No open lesions.  No signs of infection today.  Vascular: Dorsalis Pedis artery and Posterior Tibial artery pedal pulses are 2/4 bilateral with immedate capillary fill time. There is no pain with calf compression, swelling, warmth, erythema.   Neruologic: Grossly intact via light touch bilateral.   Musculoskeletal: On the able to appreciate any significant pain other than some slight discomfort along the sinus tarsi today on the right foot.  There is slight edema is no erythema or warmth.  No area pinpoint tenderness.  Flexor, extensor tendons appear to be intact.  MMT 5/5.  Gait: Unassisted, Nonantalgic.       Assessment:   71 year old female with ingrown toenails, onychomycosis, capsulitis     Plan:  -Treatment options discussed including all alternatives, risks, and complications -Etiology of symptoms were discussed -X-rays obtained reviewed the right foot.  3 views obtained.  No subacute fracture. -Regards to the right foot pain we discussed steroid injection which she held off.  Discussed shoes and good arch support.  If symptoms worsen or continue to let me know. -I sharply debrided the nails and clear the ingrown toenails with any complications or bleeding.  Discussed that if symptoms continue or worsen will need to have a partial nail avulsion and she will consider her options. -Culture obtained the left hallux toenail    Trula Slade DPM

## 2022-04-09 DIAGNOSIS — M1A0711 Idiopathic chronic gout, right ankle and foot, with tophus (tophi): Secondary | ICD-10-CM | POA: Diagnosis not present

## 2022-04-09 DIAGNOSIS — E1142 Type 2 diabetes mellitus with diabetic polyneuropathy: Secondary | ICD-10-CM | POA: Diagnosis not present

## 2022-04-09 DIAGNOSIS — Z79899 Other long term (current) drug therapy: Secondary | ICD-10-CM | POA: Diagnosis not present

## 2022-04-10 ENCOUNTER — Encounter: Payer: Self-pay | Admitting: Neurology

## 2022-04-15 ENCOUNTER — Encounter: Payer: Self-pay | Admitting: Podiatry

## 2022-04-16 ENCOUNTER — Other Ambulatory Visit: Payer: Self-pay | Admitting: Family Medicine

## 2022-04-21 ENCOUNTER — Ambulatory Visit: Payer: Medicare Other | Admitting: Podiatry

## 2022-04-21 DIAGNOSIS — L600 Ingrowing nail: Secondary | ICD-10-CM

## 2022-04-21 DIAGNOSIS — B351 Tinea unguium: Secondary | ICD-10-CM

## 2022-04-21 MED ORDER — CICLOPIROX 8 % EX SOLN: Freq: Every day | CUTANEOUS | 0 refills | Status: DC

## 2022-04-21 NOTE — Patient Instructions (Addendum)
Soak Instructions    THE DAY AFTER THE PROCEDURE  Place 1/4 cup of epsom salts in a quart of warm tap water.  Submerge your foot or feet with outer bandage intact for the initial soak; this will allow the bandage to become moist and wet for easy lift off.  Once you remove your bandage, continue to soak in the solution for 20 minutes.  This soak should be done twice a day.  Next, remove your foot or feet from solution, blot dry the affected area and cover.  You may use a band aid large enough to cover the area or use gauze and tape.  Apply other medications to the area as directed by the doctor such as polysporin neosporin.  IF YOUR SKIN BECOMES IRRITATED WHILE USING THESE INSTRUCTIONS, IT IS OKAY TO SWITCH TO  WHITE VINEGAR AND WATER. Or you may use antibacterial soap and water to keep the toe clean  Monitor for any signs/symptoms of infection. Call the office immediately if any occur or go directly to the emergency room. Call with any questions/concerns.   Ciclopirox Topical Solution- DO NOT START ON THE TOENAIL WITH THE PROCEDURE UNTIL HEALED What is this medication? CICLOPIROX (sye kloe PEER ox) treats fungal infections of the nails. It belongs to a group of medications called antifungals. It will not treat infections caused by bacteria or viruses. This medicine may be used for other purposes; ask your health care provider or pharmacist if you have questions. COMMON BRAND NAME(S): Ciclodan Nail Solution, CNL8, Penlac What should I tell my care team before I take this medication? They need to know if you have any of these conditions: Diabetes (high blood sugar) Immune system problems Organ transplant Receiving steroid inhalers, cream, or lotion Seizures Tingling of the fingers or toes or other nerve disorder An unusual or allergic reaction to ciclopirox, other medications, foods, dyes, or preservatives Pregnant or trying to get pregnant Breast-feeding How should I use this  medication? This medication is for external use only. Do not take by mouth. Wash your hands before and after use. If you are treating your hands, only wash your hands before use. Do not get it in your eyes. If you do, rinse your eyes with plenty of cool tap water. Use it as directed on the prescription label at the same time every day. Do not use it more often than directed. Use the medication for the full course as directed by your care team, even if you think you are better. Do not stop using it unless your care team tells you to stop it early. Apply a thin film of the medication to the affected area. Talk to your care team about the use of this medication in children. While it may be prescribed for children as young as 12 years for selected conditions, precautions do apply. Overdosage: If you think you have taken too much of this medicine contact a poison control center or emergency room at once. NOTE: This medicine is only for you. Do not share this medicine with others. What if I miss a dose? If you miss a dose, use it as soon as you can. If it is almost time for your next dose, use only that dose. Do not use double or extra doses. What may interact with this medication? Interactions are not expected. Do not use any other skin products without telling your care team. This list may not describe all possible interactions. Give your health care provider a list of all  the medicines, herbs, non-prescription drugs, or dietary supplements you use. Also tell them if you smoke, drink alcohol, or use illegal drugs. Some items may interact with your medicine. What should I watch for while using this medication? Visit your care team for regular checks on your progress. It may be some time before you see the benefit from this medication. Do not use nail polish or other nail cosmetic products on the treated nails. Removal of the unattached, infected nail by your care team is needed with use of this medication.  If you have diabetes or numbness in your fingers or toes, talk to your care team about proper nail care. What side effects may I notice from receiving this medication? Side effects that you should report to your care team as soon as possible: Allergic reactions--skin rash, itching, hives, swelling of the face, lips, tongue, or throat Burning, itching, crusting, or peeling of treated skin Side effects that usually do not require medical attention (report to your care team if they continue or are bothersome): Change in nail shape, thickness, or color Mild skin irritation, redness, or dryness This list may not describe all possible side effects. Call your doctor for medical advice about side effects. You may report side effects to FDA at 1-800-FDA-1088. Where should I keep my medication? Keep out of the reach of children and pets. Store at room temperature between 20 and 25 degrees C (68 and 77 degrees F). This medication is flammable. Avoid exposure to heat, fire, flame, and smoking. Get rid of medications that are no longer needed or have expired: Take the medication to a medication take-back program. Check with your pharmacy or law enforcement to find a location. If you cannot return the medication, check the label or package insert to see if the medication should be thrown out in the garbage or flushed down the toilet. If you are not sure, ask your care team. If it is safe to put in the trash, take the medication out of the container. Mix the medication with cat litter, dirt, coffee grounds, or other unwanted substance. Seal the mixture in a bag or container. Put it in the trash. NOTE: This sheet is a summary. It may not cover all possible information. If you have questions about this medicine, talk to your doctor, pharmacist, or health care provider.  2023 Elsevier/Gold Standard (2021-05-28 00:00:00)

## 2022-04-21 NOTE — Progress Notes (Unsigned)
Subjective: Chief Complaint  Patient presents with   Ingrown Toenail    Right hallux ingrown toe nail removal, patient is still having pain   71 year old female presents with above concerns.  No swelling redness or drainage to the toenail site but is causing discomfort.  Objective: AAO x3, NAD DP/PT pulses palpable bilaterally, CRT less than 3 seconds Incurvation present to right medial nail border with tenderness palpation.  Localized edema with no erythema, drainage or pus or signs of infection the nails in general are hypertrophic, dystrophic yellow, brown discoloration.  No edema, erythema to the other toenails.  No open lesions.  MMT 5/5. No pain with calf compression, swelling, warmth, erythema  Assessment: Ingrown toenail right medial hallux, onychomycosis  Plan: -All treatment options discussed with the patient including all alternatives, risks, complications.  -At this time, the patient is requesting partial nail removal with chemical matricectomy to the symptomatic portion of the nail. Risks and complications were discussed with the patient for which they understand and written consent was obtained. Under sterile conditions a total of 3 mL of a mixture of 2% lidocaine plain and 0.5% Marcaine plain was infiltrated in a hallux block fashion. Once anesthetized, the skin was prepped in sterile fashion. A tourniquet was then applied. Next the medial aspect of hallux nail border was then sharply excised making sure to remove the entire offending nail border. Once the nails were ensured to be removed area was debrided and the underlying skin was intact. There is no purulence identified in the procedure. Next phenol was then applied under standard conditions and copiously irrigated. Silvadene was applied. A dry sterile dressing was applied. After application of the dressing the tourniquet was removed and there is found to be an immediate capillary refill time to the digit. The patient tolerated  the procedure well any complications. Post procedure instructions were discussed the patient for which he verbally understood.  Discussed signs/symptoms of infection and directed to call the office immediately should any occur or go directly to the emergency room. In the meantime, encouraged to call the office with any questions, concerns, changes symptoms. -Penlac for nail fungus -Patient encouraged to call the office with any questions, concerns, change in symptoms.   Trula Slade DPM

## 2022-04-28 ENCOUNTER — Other Ambulatory Visit: Payer: Self-pay | Admitting: Podiatry

## 2022-04-28 DIAGNOSIS — M778 Other enthesopathies, not elsewhere classified: Secondary | ICD-10-CM

## 2022-05-05 ENCOUNTER — Ambulatory Visit (INDEPENDENT_AMBULATORY_CARE_PROVIDER_SITE_OTHER): Payer: Medicare Other

## 2022-05-05 DIAGNOSIS — L600 Ingrowing nail: Secondary | ICD-10-CM

## 2022-05-05 NOTE — Progress Notes (Signed)
Patient in office for nail check after ingrown partial toenail removal procedure.   Patient denies nausea, vomiting, fever and chills. No signs of infection noted. Patient states the toe is still a little sore but has continued to improve. Scab noted to the medial border of the toenail bed.  Advised patient that she could continue soaking the foot in epsom salt mixture x 1 week for comfort. Patient verbalized understanding.   Call the office with any questions, comments, or concerns.

## 2022-05-06 NOTE — Progress Notes (Signed)
Noted with appt note

## 2022-05-06 NOTE — Progress Notes (Signed)
[facility] Quality Team Note  Name: Tamara Lam Date of Birth: 1950/12/10 MRN: [id number] Date: 05/06/2022  [facility] Quality Team has reviewed this patient's chart, please see recommendations below:  [facility] Quality Other; (KED: Kidney Health Evaluation Gap- Patient needs Urine Albumin Creatinine Ratio Test completed for gap closure. EGFR has already been completed, Patient has upcoming appointment with [facility], 05/28/2022.)

## 2022-05-07 ENCOUNTER — Encounter: Payer: Self-pay | Admitting: Podiatry

## 2022-05-08 ENCOUNTER — Encounter: Payer: Self-pay | Admitting: Internal Medicine

## 2022-05-13 ENCOUNTER — Encounter: Payer: Self-pay | Admitting: Podiatry

## 2022-05-17 ENCOUNTER — Other Ambulatory Visit: Payer: Self-pay | Admitting: Family Medicine

## 2022-05-26 DIAGNOSIS — E785 Hyperlipidemia, unspecified: Secondary | ICD-10-CM | POA: Diagnosis not present

## 2022-05-28 ENCOUNTER — Encounter: Payer: Self-pay | Admitting: Family Medicine

## 2022-05-28 ENCOUNTER — Ambulatory Visit (HOSPITAL_BASED_OUTPATIENT_CLINIC_OR_DEPARTMENT_OTHER): Payer: Medicare Other | Admitting: Pulmonary Disease

## 2022-05-28 ENCOUNTER — Ambulatory Visit (INDEPENDENT_AMBULATORY_CARE_PROVIDER_SITE_OTHER): Payer: Medicare Other | Admitting: Family Medicine

## 2022-05-28 VITALS — BP 127/83 | HR 74 | Resp 16 | Ht 67.0 in | Wt 130.0 lb

## 2022-05-28 DIAGNOSIS — R7303 Prediabetes: Secondary | ICD-10-CM | POA: Diagnosis not present

## 2022-05-28 DIAGNOSIS — H409 Unspecified glaucoma: Secondary | ICD-10-CM

## 2022-05-28 DIAGNOSIS — E785 Hyperlipidemia, unspecified: Secondary | ICD-10-CM

## 2022-05-28 DIAGNOSIS — F419 Anxiety disorder, unspecified: Secondary | ICD-10-CM

## 2022-05-28 DIAGNOSIS — F3341 Major depressive disorder, recurrent, in partial remission: Secondary | ICD-10-CM | POA: Diagnosis not present

## 2022-05-28 LAB — LIPID PANEL
Chol/HDL Ratio: 3.5 ratio (ref 0.0–4.4)
Cholesterol, Total: 202 mg/dL — ABNORMAL HIGH (ref 100–199)
HDL: 58 mg/dL (ref >39)
LDL Chol Calc (NIH): 121 mg/dL — ABNORMAL HIGH (ref 0–99)
Triglycerides: 128 mg/dL (ref 0–149)
VLDL Cholesterol Cal: 23 mg/dL (ref 5–40)

## 2022-05-28 LAB — HEPATIC FUNCTION PANEL
ALT: 13 IU/L (ref 0–32)
AST: 21 IU/L (ref 0–40)
Albumin: 4.4 g/dL (ref 3.8–4.8)
Alkaline Phosphatase: 50 IU/L (ref 44–121)
Bilirubin Total: 0.4 mg/dL (ref 0.0–1.2)
Bilirubin, Direct: 0.11 mg/dL (ref 0.00–0.40)
Total Protein: 6.5 g/dL (ref 6.0–8.5)

## 2022-05-28 MED ORDER — ESCITALOPRAM OXALATE 10 MG PO TABS: 10.0000 mg | ORAL_TABLET | Freq: Every day | ORAL | 3 refills | Status: DC

## 2022-05-28 MED ORDER — MONTELUKAST SODIUM 10 MG PO TABS: 10.0000 mg | ORAL_TABLET | Freq: Every day | ORAL | 3 refills | Status: DC

## 2022-05-28 NOTE — Patient Instructions (Addendum)
Annual exam in Feb with MD, when due  Nurse please send for covid vaccine  pt got at [facility] [address] in past 3 months and document   Please reduce fried and fatty foods    Continue to have fun with friends and family!  No blood work needed for [employer]  It is important that you exercise regularly at least 30 minutes 5 times a week. If you develop chest pain, have severe difficulty breathing, or feel very tired, stop exercising immediately and seek medical attention   Thanks for choosing [facility], we consider it a privelige to serve you.

## 2022-06-02 ENCOUNTER — Encounter: Payer: Self-pay | Admitting: Family Medicine

## 2022-06-02 NOTE — Assessment & Plan Note (Signed)
Controlled, no change in medication  

## 2022-06-02 NOTE — Assessment & Plan Note (Signed)
Followed by Endo Patient educated about the importance of limiting  Carbohydrate intake , the need to commit to daily physical activity for a minimum of 30 minutes , and to commit weight loss. The fact that changes in all these areas will reduce or eliminate all together the development of diabetes is stressed.      Latest Ref Rng & Units 05/26/2022    4:15 PM 01/20/2022   12:16 PM 11/20/2021    1:57 PM 07/31/2021   10:29 AM 04/16/2021   10:43 AM  Diabetic Labs  HbA1c 4.8 - 5.6 %  5.9   6.1  6.0   Chol 100 - 199 mg/dL 202  210   173  172   HDL >39 mg/dL 58  58   60  54   Calc LDL 0 - 99 mg/dL 121  138   96  103   Triglycerides 0 - 149 mg/dL 128  77   96  81   Creatinine 0.57 - 1.00 mg/dL   0.71  0.77        05/28/2022    1:07 PM 02/17/2022   10:02 AM 01/28/2022    2:48 PM 01/22/2022    1:26 PM 11/20/2021    1:06 PM 09/16/2021    2:07 PM 07/31/2021    9:15 AM  BP/Weight  Systolic BP 177 116 579 038 333 98 832  Diastolic BP 83 86 72 78 68 62 87  Wt. (Lbs) 130 130 129.6 129.4 130.12 130.2 129  BMI 20.36 kg/m2 20.36 kg/m2 20.3 kg/m2 20.27 kg/m2 20.38 kg/m2 20.39 kg/m2 20.2 kg/m2      Latest Ref Rng & Units 04/25/2021   12:00 AM 11/13/2015    1:00 PM  Foot/eye exam completion dates  Eye Exam No Retinopathy No Retinopathy       Foot Form Completion   Done     This result is from an external source.

## 2022-06-02 NOTE — Assessment & Plan Note (Signed)
Hyperlipidemia:Low fat diet discussed and encouraged.   Lipid Panel  Lab Results  Component Value Date   CHOL 202 (H) 05/26/2022   HDL 58 05/26/2022   LDLCALC 121 (H) 05/26/2022   TRIG 128 05/26/2022   CHOLHDL 3.5 05/26/2022     Needs to reduce fat intake, not at goal

## 2022-06-02 NOTE — Assessment & Plan Note (Signed)
Tamara Lam followed by Tamara Lam

## 2022-06-02 NOTE — Progress Notes (Signed)
Tamara Lam     MRN: [id number]      DOB: [birth year]   HPI [name] is here for follow up and re-evaluation of chronic medical conditions, medication management and review of any available recent lab and radiology data.  Preventive health is updated, specifically  Cancer screening and Immunization.   Questions or concerns regarding consultations or procedures which the PT has had in the interim are  addressed. The PT denies any adverse reactions to current medications since the last visit.  There are no new concerns.  There are no specific complaints   ROS Denies recent fever or chills. Denies sinus pressure, nasal congestion, ear pain or sore throat. Denies chest congestion, productive cough or wheezing. Denies chest pains, palpitations and leg swelling Denies abdominal pain, nausea, vomiting,diarrhea or constipation.   Denies dysuria, frequency, hesitancy or incontinence. Denies joint pain, swelling and limitation in mobility. Denies headaches, seizures, numbness, or tingling. Denies depression, anxiety or insomnia. Denies skin break down or rash.   PE  BP 127/83   Pulse 74   Resp 16   Ht '5\' 7"'$  (1.702 m)   Wt 130 lb (59 kg)   SpO2 93%   BMI 20.36 kg/m   Patient alert and oriented and in no cardiopulmonary distress.  HEENT: No facial asymmetry, EOMI,     Neck supple .  Chest: Clear to auscultation bilaterally.  CVS: S1, S2 no murmurs, no S3.Regular rate.  ABD: Soft non tender.   Ext: No edema  MS: Adequate ROM spine, shoulders, hips and knees.  Skin: Intact, no ulcerations or rash noted.  Psych: Good eye contact, normal affect. Memory intact not anxious or depressed appearing.  CNS: CN 2-12 intact, power,  normal throughout.no focal deficits noted.   Assessment & Plan  Hyperlipidemia LDL goal <100 Hyperlipidemia:Low fat diet discussed and encouraged.   Lipid Panel  Lab Results  Component Value Date   CHOL 202 (H) 05/26/2022   HDL 58  05/26/2022   LDLCALC 121 (H) 05/26/2022   TRIG 128 05/26/2022   CHOLHDL 3.5 05/26/2022     Needs to reduce fat intake, not at goal  MDD (major depressive disorder), recurrent, in partial remission (HCC) Controlled, no change in medication   Prediabetes Followed by Endo Patient educated about the importance of limiting  Carbohydrate intake , the need to commit to daily physical activity for a minimum of 30 minutes , and to commit weight loss. The fact that changes in all these areas will reduce or eliminate all together the development of diabetes is stressed.      Latest Ref Rng & Units 05/26/2022    4:15 PM 01/20/2022   12:16 PM 11/20/2021    1:57 PM 07/31/2021   10:29 AM 04/16/2021   10:43 AM  Diabetic Labs  HbA1c 4.8 - 5.6 %  5.9   6.1  6.0   Chol 100 - 199 mg/dL 202  210   173  172   HDL >39 mg/dL 58  58   60  54   Calc LDL 0 - 99 mg/dL 121  138   96  103   Triglycerides 0 - 149 mg/dL 128  77   96  81   Creatinine 0.57 - 1.00 mg/dL   0.71  0.77        05/28/2022    1:07 PM 02/17/2022   10:02 AM 01/28/2022    2:48 PM 01/22/2022    1:26 PM 11/20/2021    1:06  PM 09/16/2021    2:07 PM 07/31/2021    9:15 AM  BP/Weight  Systolic BP 563 875 643 329 518 98 841  Diastolic BP 83 86 72 78 68 62 87  Wt. (Lbs) 130 130 129.6 129.4 130.12 130.2 129  BMI 20.36 kg/m2 20.36 kg/m2 20.3 kg/m2 20.27 kg/m2 20.38 kg/m2 20.39 kg/m2 20.2 kg/m2      Latest Ref Rng & Units 04/25/2021   12:00 AM 11/13/2015    1:00 PM  Foot/eye exam completion dates  Eye Exam No Retinopathy No Retinopathy       Foot Form Completion   Done     This result is from an external source.      Glaucoma Controlled followed by Opthal  Anxiety disorder Controlled, no change in medication

## 2022-06-03 NOTE — Progress Notes (Signed)
[facility] Quality Team Note  Name: [name] Date of Birth: 08-Apr-1951 MRN: [id number] Date: 06/03/2022  [facility] Quality Team has reviewed this patient's chart, please see recommendations below:  [facility] Quality Other; (KED GAP: KIDNEY HEALTH EVALUATION- PATIENT NEEDS URINE MICROALBUMIN/CREATININE RATIO TEST COMPLETED BEFORE END OF YEAR FOR GAP CLOSURE.)

## 2022-06-11 ENCOUNTER — Encounter: Payer: Self-pay | Admitting: Pulmonary Disease

## 2022-06-11 ENCOUNTER — Ambulatory Visit: Payer: Medicare Other | Admitting: Pulmonary Disease

## 2022-06-11 VITALS — BP 108/64 | HR 64 | Ht 67.0 in | Wt 133.0 lb

## 2022-06-11 DIAGNOSIS — G4733 Obstructive sleep apnea (adult) (pediatric): Secondary | ICD-10-CM | POA: Diagnosis not present

## 2022-06-11 DIAGNOSIS — R053 Chronic cough: Secondary | ICD-10-CM

## 2022-06-11 NOTE — Assessment & Plan Note (Signed)
Triggers include postnasal drip and GERD. We will treat sequentially with Chlor-Trimeton for 2 weeks followed by Pepcid daily to suppress acid for 4 weeks

## 2022-06-11 NOTE — Patient Instructions (Signed)
Take pepcid OTC daily x 4 weeks  Trial of chlortrimeton '4mg'$  nightly x 2 weeks for post nasal drip  X Rx for autoCPAP 5-12 cm with nasal mask

## 2022-06-11 NOTE — Assessment & Plan Note (Signed)
We discussed home sleep study results.  She has mild OSA.  We discussed that cardiovascular implications of mild OSA are minimal. Treatment options include dental appliance and CPAP.  She is not excited about using a mouthguard given her recent dental experience. We will initiate a trial of auto CPAP 5 to 12 cm with nasal mask.  Common adjustment issues were discussed.

## 2022-06-11 NOTE — Progress Notes (Signed)
   Subjective:    Patient ID: Tamara Lam, female    DOB: 1951-05-09, 71 y.o.   MRN: [id number]  HPI  71 yo never smoker for FU of hypersomnolence and chronic cough   Chief Complaint  Patient presents with   Follow-up    Pt f/u to discuss HST otherwise pt is feeling well   49-monthfollow-up visit. She has seen a dentist for sore gums.  They may be thinking about doing a procedure on her lower jaw for a bridge.  She is not excited about the prospect of mouthguard. We discussed alternative CPAP machine for her symptomatic OSA. We discussed home sleep study in detail.  She reports intermittent cough. She reports occasional GERD symptoms for which she takes Pepcid. This is generally triggered by what she eats especially tomato sauce.  She still reports postnasal drip, did not take Chlor-Trimeton advised during last visit  Significant tests/ events reviewed  02/2022 HST - mild OSA with AHI 11/ hr, low sat 83% TST was 10.5 h   Review of Systems neg for any significant sore throat, dysphagia, itching, sneezing, nasal congestion or excess/ purulent secretions, fever, chills, sweats, unintended wt loss, pleuritic or exertional cp, hempoptysis, orthopnea pnd or change in chronic leg swelling. Also denies presyncope, palpitations, heartburn, abdominal pain, nausea, vomiting, diarrhea or change in bowel or urinary habits, dysuria,hematuria, rash, arthralgias, visual complaints, headache, numbness weakness or ataxia.     Objective:   Physical Exam   Gen. Pleasant, well-nourished, in no distress ENT - no thrush, no pallor/icterus,no post nasal drip Neck: No JVD, no thyromegaly, no carotid bruits Lungs: no use of accessory muscles, no dullness to percussion, clear without rales or rhonchi  Cardiovascular: Rhythm regular, heart sounds  normal, no murmurs or gallops, no peripheral edema Musculoskeletal: No deformities, no cyanosis or clubbing         Assessment & Plan:

## 2022-07-01 DIAGNOSIS — Z961 Presence of intraocular lens: Secondary | ICD-10-CM | POA: Diagnosis not present

## 2022-07-01 DIAGNOSIS — H401131 Primary open-angle glaucoma, bilateral, mild stage: Secondary | ICD-10-CM | POA: Diagnosis not present

## 2022-07-17 DIAGNOSIS — L821 Other seborrheic keratosis: Secondary | ICD-10-CM | POA: Diagnosis not present

## 2022-07-17 DIAGNOSIS — D492 Neoplasm of unspecified behavior of bone, soft tissue, and skin: Secondary | ICD-10-CM | POA: Diagnosis not present

## 2022-08-13 ENCOUNTER — Ambulatory Visit (INDEPENDENT_AMBULATORY_CARE_PROVIDER_SITE_OTHER): Payer: Medicare Other | Admitting: Family Medicine

## 2022-08-13 ENCOUNTER — Encounter: Payer: Self-pay | Admitting: Family Medicine

## 2022-08-13 VITALS — BP 129/79 | HR 61 | Ht 67.0 in | Wt 129.1 lb

## 2022-08-13 DIAGNOSIS — Z0000 Encounter for general adult medical examination without abnormal findings: Secondary | ICD-10-CM | POA: Diagnosis not present

## 2022-08-13 DIAGNOSIS — Z780 Asymptomatic menopausal state: Secondary | ICD-10-CM

## 2022-08-13 DIAGNOSIS — E785 Hyperlipidemia, unspecified: Secondary | ICD-10-CM

## 2022-08-13 DIAGNOSIS — R7303 Prediabetes: Secondary | ICD-10-CM | POA: Diagnosis not present

## 2022-08-13 DIAGNOSIS — E559 Vitamin D deficiency, unspecified: Secondary | ICD-10-CM | POA: Diagnosis not present

## 2022-08-13 NOTE — Patient Instructions (Signed)
F/U in 5 months, call if you need me sooner  Nurse please order dexa, so this is  scheduled at checkout  Fasting CBC, lipid, cmp and EGFR and vit D first week in May  It is important that you exercise regularly at least 30 minutes 5 times a week. If you develop chest pain, have severe difficulty breathing, or feel very tired, stop exercising immediately and seek medical attention   Think about what you will eat, plan ahead. Choose " clean, green, fresh or frozen" over canned, processed or packaged foods which are more sugary, salty and fatty. 70 to 75% of food eaten should be vegetables and fruit. Three meals at set times with snacks allowed between meals, but they must be fruit or vegetables. Aim to eat over a 12 hour period , example 7 am to 7 pm, and STOP after  your last meal of the day. Drink water,generally about 64 ounces per day, no other drink is as healthy. Fruit juice is best enjoyed in a healthy way, by EATING the fruit. Thanks for choosing [facility], we consider it a privelige to serve you.

## 2022-08-17 DIAGNOSIS — Z0000 Encounter for general adult medical examination without abnormal findings: Secondary | ICD-10-CM | POA: Insufficient documentation

## 2022-08-17 NOTE — Assessment & Plan Note (Signed)
Annual exam as documented. Counseling done  re healthy lifestyle involving commitment to 150 minutes exercise per week, heart healthy diet, and attaining healthy weight.The importance of adequate sleep also discussed. Changes in health habits are decided on by the patient with goals and time frames  set for achieving them. Immunization and cancer screening needs are specifically addressed at this visit. 

## 2022-08-17 NOTE — Progress Notes (Signed)
    Tamara Lam     MRN: [id number]      DOB: 06/28/1951  HPI: Patient is in for annual physical exam. No other health concerns are expressed or addressed at the visit. Recent labs,  are reviewed. Immunization is reviewed , and  updated if needed.   PE: BP 129/79 (BP Location: Right Arm, Patient Position: Sitting, Cuff Size: Normal)   Pulse 61   Ht 5' 7"$  (1.702 m)   Wt 129 lb 1.9 oz (58.6 kg)   SpO2 95%   BMI 20.22 kg/m   Pleasant  female, alert and oriented x 3, in no cardio-pulmonary distress. Afebrile. HEENT No facial trauma or asymetry. Sinuses non tender.  Extra occullar muscles intact.. External ears normal, . Neck: supple, no adenopathy,JVD or thyromegaly.No bruits.  Chest: Clear to ascultation bilaterally.No crackles or wheezes. Non tender to palpation  Cardiovascular system; Heart sounds normal,  S1 and  S2 ,no S3.  No murmur, or thrill. Apical beat not displaced Peripheral pulses normal.  Abdomen: Soft, non tender, no organomegaly or masses. No bruits. Bowel sounds normal. No guarding, tenderness or rebound.      Musculoskeletal exam: Full ROM of spine, hips , shoulders and knees. No deformity ,swelling or crepitus noted. No muscle wasting or atrophy.   Neurologic: Cranial nerves 2 to 12 intact. Power, tone ,sensation and reflexes normal throughout. No disturbance in gait. No tremor.  Skin: Intact, no ulceration, erythema , scaling or rash noted. Pigmentation normal throughout  Psych; Normal mood and affect. Judgement and concentration normal   Assessment & Plan:  Encounter for annual physical exam Annual exam as documented. Counseling done  re healthy lifestyle involving commitment to 150 minutes exercise per week, heart healthy diet, and attaining healthy weight.The importance of adequate sleep also discussed.  Changes in health habits are decided on by the patient with goals and time frames  set for achieving them. Immunization and  cancer screening needs are specifically addressed at this visit.

## 2022-08-22 ENCOUNTER — Ambulatory Visit (HOSPITAL_COMMUNITY)
Admission: RE | Admit: 2022-08-22 | Discharge: 2022-08-22 | Disposition: A | Discharge status: Home / Self Care | Payer: Medicare Other | Source: Ambulatory Visit | Attending: Family Medicine | Admitting: Family Medicine

## 2022-08-22 DIAGNOSIS — M85851 Other specified disorders of bone density and structure, right thigh: Secondary | ICD-10-CM | POA: Diagnosis not present

## 2022-08-22 DIAGNOSIS — Z780 Asymptomatic menopausal state: Secondary | ICD-10-CM | POA: Insufficient documentation

## 2022-09-11 DIAGNOSIS — E042 Nontoxic multinodular goiter: Secondary | ICD-10-CM | POA: Diagnosis not present

## 2022-09-12 LAB — T4, FREE: Free T4: 0.99 ng/dL (ref 0.82–1.77)

## 2022-09-12 LAB — TSH: TSH: 1.94 u[IU]/mL (ref 0.450–4.500)

## 2022-09-12 LAB — T3, FREE: T3, Free: 3.2 pg/mL (ref 2.0–4.4)

## 2022-09-15 ENCOUNTER — Encounter: Payer: Self-pay | Admitting: Neurology

## 2022-09-15 ENCOUNTER — Ambulatory Visit: Payer: Medicare Other | Admitting: Neurology

## 2022-09-15 VITALS — BP 154/82 | HR 69 | Ht 67.0 in | Wt 131.0 lb

## 2022-09-15 DIAGNOSIS — R202 Paresthesia of skin: Secondary | ICD-10-CM

## 2022-09-15 DIAGNOSIS — R292 Abnormal reflex: Secondary | ICD-10-CM | POA: Diagnosis not present

## 2022-09-15 NOTE — Patient Instructions (Signed)
MRI lumbar spine without contrast    

## 2022-09-15 NOTE — Progress Notes (Signed)
[facility] Clinic Note - Initial Visit   Date: 09/15/2022   Tamara Lam MRN: [id number] DOB: March 06, 1951   Dear Dr. Merlene Laughter:  Thank you for your kind referral of CHRSTINA Lam for consultation of neuropathy. Although her history is well known to you, please allow [place] to reiterate it for the purpose of our medical record. The patient was accompanied to the clinic by self.     Tamara Lam is a 72 y.o. right-handed female with prediabetes, hyperlipidemia, GERD, depression, and seasonal allergies presenting for evaluation of bilateral feet tingling.   IMPRESSION/PLAN: Bilateral feet paresthesias may be stemming from central canal stenosis of the spinal cord, given hyperreflexia in the legs and mildly reduced distal sensation.  Neuropathy seems less likely based on her exam, as I would expect loss of reflexes.  I will proceed with MRI lumbar spine wo contrast to evaluate for compressive pathology.  If this is not revealing, I will check MRI cervical spine.  Paresthesias are not bothersome enough to warrant medication at this time.   Further recommendations pending results.  ------------------------------------------------------------- History of present illness: Starting around 2021, she began having burning and tingling over the top of her feet and ankles, which is intermittent.  Symptoms occur a few times per week and last several hours.  She denies weakness.  She has some imbalance, and walks unassisted. No falls. No exacerbating or alleviating factors.  She denies neck or low back pain.  No shooting pain down the legs or arms.   She lives at home with son in a one-level home.  Nonsmoker, does not drink alcohol.    Out-side paper records, electronic medical record, and images have been reviewed where available and summarized as:  Lab Results  Component Value Date   HGBA1C 5.9 (H) 01/20/2022   Lab Results  Component Value Date   VITAMINB12 646  09/17/2011   Lab Results  Component Value Date   TSH 1.940 09/11/2022   Lab Results  Component Value Date   ESRSEDRATE 21 11/20/2021    Past Medical History:  Diagnosis Date   Abnormal CXR (chest x-ray)    Evaluated with Pulmonary- Dr. Luan Pulling- "pt. was told probably due to thin body frame"-saw no issues-PFT test normal.   Anxiety 1980's    Arthritis    osteoarthritis -knees, hands   Chronic constipation    tx. Linzess   Chronic nausea    Glaucoma 2004   Dr. Venetia Maxon, laser to left eye Sept 2011, bilateral eye drops for this   Helicobacter pylori gastritis 04/2004   last EGD    Hematochezia    Hx of colonoscopy 08/2005   Dr. Gala Romney / hemorrhoids    Thyroid disease    thyroid goiter, nodules-no problems.   Weight loss     Past Surgical History:  Procedure Laterality Date   ABDOMINAL HYSTERECTOMY     arthoscopic surgery on right knee  2007   BIOPSY  03/03/2018   Procedure: BIOPSY;  Surgeon: Daneil Dolin, MD;  Location: [facility];  Service: Endoscopy;;  gastric for h. pylori   CHOLECYSTECTOMY  1990's   COLONOSCOPY  2011   Dr. Gala Romney: anal canal/internal hemorrhoids, dimintuive rectosigmoid polyp (polypoid rectal mucosa), few sigmoid diverticula    COLONOSCOPY N/A 10/30/2015   Dr. Gala Romney: diverticulosis, hemorrhoids, surveillance in 2022    COLONOSCOPY WITH PROPOFOL N/A 07/08/2021   Procedure: COLONOSCOPY WITH PROPOFOL;  Surgeon: Daneil Dolin, MD;  Location: [facility];  Service:  Endoscopy;  Laterality: N/A;  10:00am   ESOPHAGOGASTRODUODENOSCOPY  2005   Dr. Gala Romney: diffuse submucosal petechial hemorrhage of te gastric mucosa, some noncompliance of gastric cavity, failure to insufflate fully, multiple gastric biopsies. gastric bx, h.pylori   ESOPHAGOGASTRODUODENOSCOPY N/A 03/03/2018   Dr. Gala Romney: scattered antral erosions/erythema no ulcer/mass. Bx: mild inflammation and No H.pylori   TOTAL KNEE ARTHROPLASTY Right 05/26/2016   Procedure: RIGHT TOTAL KNEE ARTHROPLASTY;   Surgeon: Gaynelle Arabian, MD;  Location: [facility];  Service: Orthopedics;  Laterality: Right;   VESICOVAGINAL FISTULA CLOSURE W/ TAH  1989     Medications:  Outpatient Encounter Medications as of 09/15/2022  Medication Sig Note   Calcium-Magnesium-Vitamin D (CALCIUM 1200+D3 PO) Take 1 tablet by mouth daily.    ciclopirox (PENLAC) 8 % solution Apply topically at bedtime. Apply over nail and surrounding skin. Apply daily over previous coat. After seven (7) days, may remove with alcohol and continue cycle.    escitalopram (LEXAPRO) 10 MG tablet Take 1 tablet (10 mg total) by mouth at bedtime.    famotidine (PEPCID) 20 MG tablet Take 20 mg by mouth daily as needed for heartburn. [id number]: Takes after certain foods, takes when needed   fluticasone (FLONASE) 50 MCG/ACT nasal spray Use 2 sprays in each nostril once daily    linaclotide (LINZESS) 290 MCG CAPS capsule Take 1 capsule (290 mcg total) by mouth daily before breakfast. TAKE 1 CAPSULE (290 mcg total) BY MOUTH  DAILY BEFORE BREAKFAST 05/28/2022: Takes  3 times weekly onn avg for bM   montelukast (SINGULAIR) 10 MG tablet Take 1 tablet (10 mg total) by mouth at bedtime.    Multiple Vitamins-Minerals (CENTRUM ADULTS) TABS Take 1 tablet by mouth daily.    simvastatin (ZOCOR) 10 MG tablet Take one tablet by mouth every Monday, Wednesday and Friday    travoprost, benzalkonium, (TRAVATAN) 0.004 % ophthalmic solution Place 1 drop into both eyes at bedtime.    vitamin C (ASCORBIC ACID) 500 MG tablet Take 500 mg by mouth daily.    No facility-administered encounter medications on file as of 09/15/2022.    Allergies: No Known Allergies  Family History: Family History  Problem Relation Age of Onset   Hypertension Mother    Glaucoma Mother    Diverticulosis Mother    Heart disease Mother    Lung cancer Father    Mental illness Sister    Depression Sister    Anxiety disorder Sister    Cancer Sister    Depression Sister    Anxiety disorder Sister     Depression Sister    Anxiety disorder Sister    Leukemia Brother        some form of leukemia   Depression Daughter    Colon polyps Son    Colon polyps Son        before age 91   Colon cancer Neg Hx     Social History: Social History   Tobacco Use   Smoking status: Never   Smokeless tobacco: Never   Tobacco comments:    Never smoked  Vaping Use   Vaping Use: Never used  Substance Use Topics   Alcohol use: No    Alcohol/week: 0.0 standard drinks of alcohol   Drug use: No   Social History   Social History Narrative   Right Handed   Lives in a one story home with her son.     Vital Signs:  BP (!) 154/82   Pulse 69   Ht 5\' 7"  (  1.702 m)   Wt 131 lb (59.4 kg)   SpO2 97%   BMI 20.52 kg/m   Neurological Exam: MENTAL STATUS including orientation to time, place, person, recent and remote memory, attention span and concentration, language, and fund of knowledge is normal.  Speech is not dysarthric.  CRANIAL NERVES: II:  No visual field defects.     III-IV-VI: Pupils equal round and reactive to light.  Normal conjugate, extra-ocular eye movements in all directions of gaze.  No nystagmus.  No ptosis.   V:  Normal facial sensation.    VII:  Normal facial symmetry and movements.   VIII:  Normal hearing and vestibular function.   IX-X:  Normal palatal movement.   XI:  Normal shoulder shrug and head rotation.   XII:  Normal tongue strength and range of motion, no deviation or fasciculation.  MOTOR:  No atrophy, fasciculations or abnormal movements.  No pronator drift.   Upper Extremity:  Right  Left  Deltoid  5/5   5/5   Biceps  5/5   5/5   Triceps  5/5   5/5   Wrist extensors  5/5   5/5   Wrist flexors  5/5   5/5   Finger extensors  5/5   5/5   Finger flexors  5/5   5/5   Dorsal interossei  5-/5   5-/5   Tone (Ashworth scale)  0  0   Lower Extremity:  Right  Left  Hip flexors  5/5   5/5   Knee flexors  5/5   5/5   Knee extensors  5/5   5/5   Dorsiflexors   5/5   5/5   Plantarflexors  5/5   5/5   Toe extensors  5/5   5/5   Toe flexors  5/5   5/5   Tone (Ashworth scale)  0  0   MSRs:                                           Right        Left brachioradialis 2+  2++  biceps 2+  2+  triceps 2+  2+  patellar 3+  3+  ankle jerk 2+  2+  Hoffman no  no  plantar response down  down  Crossed adductors bilaterally  SENSORY:  Vibration mildly reduced at the great toe bilaterally, otherwise normal and symmetric perception of light touch, pinprick, and temperature.  Romberg's sign absent.   COORDINATION/GAIT: Normal finger-to- nose-finger.  Intact rapid alternating movements bilaterally.  Able to rise from a chair without using arms.  Gait narrow based and stable. Stressed gait intact.  Unsteady with tandem gait.    Thank you for allowing me to participate in patient's care.  If I can answer any additional questions, I would be pleased to do so.    Sincerely,    Calistro Rauf K. Posey Pronto, DO

## 2022-09-17 ENCOUNTER — Ambulatory Visit: Payer: Medicare Other | Admitting: "Endocrinology

## 2022-09-17 ENCOUNTER — Encounter: Payer: Self-pay | Admitting: "Endocrinology

## 2022-09-17 VITALS — BP 126/70 | HR 79 | Ht 67.0 in | Wt 130.0 lb

## 2022-09-17 DIAGNOSIS — E042 Nontoxic multinodular goiter: Secondary | ICD-10-CM

## 2022-09-17 DIAGNOSIS — M8580 Other specified disorders of bone density and structure, unspecified site: Secondary | ICD-10-CM

## 2022-09-17 DIAGNOSIS — E782 Mixed hyperlipidemia: Secondary | ICD-10-CM | POA: Diagnosis not present

## 2022-09-17 DIAGNOSIS — R7303 Prediabetes: Secondary | ICD-10-CM

## 2022-09-17 LAB — POCT GLYCOSYLATED HEMOGLOBIN (HGB A1C): HbA1c, POC (prediabetic range): 5.8 % (ref 5.7–6.4)

## 2022-09-17 NOTE — Progress Notes (Signed)
09/17/2022  Endocrinology follow-up note  Subjective:    Patient ID: Tamara Lam, female    DOB: 02-13-1951, PCP Fayrene Helper, MD   Past Medical History:  Diagnosis Date   Abnormal CXR (chest x-ray)    Evaluated with Pulmonary- Dr. Luan Pulling- "pt. was told probably due to thin body frame"-saw no issues-PFT test normal.   Anxiety 1980's    Arthritis    osteoarthritis -knees, hands   Chronic constipation    tx. Linzess   Chronic nausea    Glaucoma 2004   Dr. Venetia Maxon, laser to left eye Sept 2011, bilateral eye drops for this   Helicobacter pylori gastritis 04/2004   last EGD    Hematochezia    Hx of colonoscopy 08/2005   Dr. Gala Romney / hemorrhoids    Thyroid disease    thyroid goiter, nodules-no problems.   Weight loss    Past Surgical History:  Procedure Laterality Date   ABDOMINAL HYSTERECTOMY     arthoscopic surgery on right knee  2007   BIOPSY  03/03/2018   Procedure: BIOPSY;  Surgeon: Daneil Dolin, MD;  Location: [facility];  Service: Endoscopy;;  gastric for h. pylori   CHOLECYSTECTOMY  1990's   COLONOSCOPY  2011   Dr. Gala Romney: anal canal/internal hemorrhoids, dimintuive rectosigmoid polyp (polypoid rectal mucosa), few sigmoid diverticula    COLONOSCOPY N/A 10/30/2015   Dr. Gala Romney: diverticulosis, hemorrhoids, surveillance in 2022    COLONOSCOPY WITH PROPOFOL N/A 07/08/2021   Procedure: COLONOSCOPY WITH PROPOFOL;  Surgeon: Daneil Dolin, MD;  Location: [facility];  Service: Endoscopy;  Laterality: N/A;  10:00am   ESOPHAGOGASTRODUODENOSCOPY  2005   Dr. Gala Romney: diffuse submucosal petechial hemorrhage of te gastric mucosa, some noncompliance of gastric cavity, failure to insufflate fully, multiple gastric biopsies. gastric bx, h.pylori   ESOPHAGOGASTRODUODENOSCOPY N/A 03/03/2018   Dr. Gala Romney: scattered antral erosions/erythema no ulcer/mass. Bx: mild inflammation and No H.pylori   TOTAL KNEE ARTHROPLASTY Right 05/26/2016   Procedure: RIGHT TOTAL KNEE ARTHROPLASTY;   Surgeon: Gaynelle Arabian, MD;  Location: [facility];  Service: Orthopedics;  Laterality: Right;   VESICOVAGINAL FISTULA CLOSURE W/ TAH  1989   Social History   Socioeconomic History   Marital status: Widowed    Spouse name: Not on file   Number of children: Not on file   Years of education: Not on file   Highest education level: Not on file  Occupational History   Occupation: homemaker     Employer: UNEMPLOYED  Tobacco Use   Smoking status: Never   Smokeless tobacco: Never   Tobacco comments:    Never smoked  Vaping Use   Vaping Use: Never used  Substance and Sexual Activity   Alcohol use: No    Alcohol/week: 0.0 standard drinks of alcohol   Drug use: No   Sexual activity: Not Currently  Other Topics Concern   Not on file  Social History Narrative   Right Handed   Lives in a one story home with her son.    Social Determinants of Health   Financial Resource Strain: Low Risk  (12/19/2020)   Overall Financial Resource Strain (CARDIA)    Difficulty of Paying Living Expenses: Not hard at all  Food Insecurity: No Food Insecurity (12/19/2020)   Hunger Vital Sign    Worried About Running Out of Food in the Last Year: Never true    Ran Out of Food in the Last Year: Never true  Transportation Needs: No Transportation Needs (12/19/2020)  PRAPARE - Hydrologist (Medical): No    Lack of Transportation (Non-Medical): No  Physical Activity: Sufficiently Active (12/19/2020)   Exercise Vital Sign    Days of Exercise per Week: 3 days    Minutes of Exercise per Session: 50 min  Stress: No Stress Concern Present (12/19/2020)   [place]    Feeling of Stress : Not at all  Social Connections: Moderately Isolated (12/19/2020)   Social Connection and Isolation Panel [NHANES]    Frequency of Communication with Friends and Family: More than three times a week    Frequency of Social Gatherings with Friends  and Family: More than three times a week    Attends Religious Services: More than 4 times per year    Active Member of [employer] or Organizations: No    Attends Archivist Meetings: Never    Marital Status: Widowed   Outpatient Encounter Medications as of 09/17/2022  Medication Sig   Calcium-Magnesium-Vitamin D (CALCIUM 1200+D3 PO) Take 1 tablet by mouth daily.   ciclopirox (PENLAC) 8 % solution Apply topically at bedtime. Apply over nail and surrounding skin. Apply daily over previous coat. After seven (7) days, may remove with alcohol and continue cycle.   escitalopram (LEXAPRO) 10 MG tablet Take 1 tablet (10 mg total) by mouth at bedtime.   famotidine (PEPCID) 20 MG tablet Take 20 mg by mouth daily as needed for heartburn.   fluticasone (FLONASE) 50 MCG/ACT nasal spray Use 2 sprays in each nostril once daily   linaclotide (LINZESS) 290 MCG CAPS capsule Take 1 capsule (290 mcg total) by mouth daily before breakfast. TAKE 1 CAPSULE (290 mcg total) BY MOUTH  DAILY BEFORE BREAKFAST   montelukast (SINGULAIR) 10 MG tablet Take 1 tablet (10 mg total) by mouth at bedtime.   Multiple Vitamins-Minerals (CENTRUM ADULTS) TABS Take 1 tablet by mouth daily.   simvastatin (ZOCOR) 10 MG tablet Take one tablet by mouth every Monday, Wednesday and Friday   travoprost, benzalkonium, (TRAVATAN) 0.004 % ophthalmic solution Place 1 drop into both eyes at bedtime.   vitamin C (ASCORBIC ACID) 500 MG tablet Take 500 mg by mouth daily.   No facility-administered encounter medications on file as of 09/17/2022.   ALLERGIES: No Known Allergies VACCINATION STATUS: Immunization History  Administered Date(s) Administered   Fluad Quad(high Dose 65+) 03/28/2019, 03/27/2021, 03/19/2022   Influenza Whole 03/13/2010, 03/12/2011   Influenza,inj,Quad PF,6+ Mos 04/24/2013, 02/20/2014, 03/27/2015, 04/28/2016, 04/14/2017, 02/24/2018, 02/20/2020   Moderna Sars-Covid-2 Vaccination 08/04/2019, 09/02/2019, 05/14/2020,  01/31/2021   Pneumococcal Conjugate-13 06/20/2014   Pneumococcal Polysaccharide-23 05/12/2016   Td 08/16/2009   Tdap 12/19/2019   Zoster Recombinat (Shingrix) 04/03/2021, 08/10/2021   Zoster, Live 02/17/2011    HPI  -72 -year-old female patient with medical history as above.  She is here to follow-up for euthyroid multinodular goiter, osteopenia, and well-controlled type 2 diabetes.    She is not on any medication for diabetes, her point-of-care A1c remains stable at  5.8 %.   She is s/p FNA of thyroid nodule which was benign in 2013, previsit thyroid ultrasound unremarkable for any new findings.  Her thyroid function tests are within normal limits.  She has no new complaints today. Her last thyroid ultrasound was unremarkable or stable findings in 2021.    She is not on any antithyroid intervention nor thyroid hormone supplement. She denies heat/cold intolerance.  She denies any family hx of thyroid dysfunction nor thyroid  cancer. she denies exposure to neck radiation. No dysphagia , nor SOB. she has steady weight.  Her most recent DEXA scan shows reversal of her osteopenia to normal bone density in the spine and stable osteopenia in the hips.   She has no interval falls, fractures.  Review of Systems  Constitutional: + Minimally fluctuating body weight,- fatigue, no subjective hyperthermia/hypothermia    Objective:    BP 126/70   Pulse 79   Ht 5\' 7"  (1.702 m)   Wt 130 lb (59 kg)   BMI 20.36 kg/m   Wt Readings from Last 3 Encounters:  09/17/22 130 lb (59 kg)  09/15/22 131 lb (59.4 kg)  08/13/22 129 lb 1.9 oz (58.6 kg)    Physical Exam Constitutional:  + Current BMI of 20.1, not in acute distress, stable state of mind.   Eyes: PERRLA, EOMI, no exophthalmos ENT: moist mucous membranes, + decrease in thyromegaly, no cervical lymphadenopathy  CMP ( most recent) CMP     Component Value Date/Time   NA 144 11/20/2021 1357   K 4.6 11/20/2021 1357   CL 101 11/20/2021 1357    CO2 27 11/20/2021 1357   GLUCOSE 101 (H) 11/20/2021 1357   GLUCOSE 82 07/25/2016 1451   BUN 13 11/20/2021 1357   CREATININE 0.71 11/20/2021 1357   CREATININE 0.68 07/25/2016 1451   CALCIUM 10.1 11/20/2021 1357   PROT 6.5 05/26/2022 1615   ALBUMIN 4.4 05/26/2022 1615   AST 21 05/26/2022 1615   ALT 13 05/26/2022 1615   ALKPHOS 50 05/26/2022 1615   BILITOT 0.4 05/26/2022 1615   GFRNONAA 79 07/26/2020 1600   GFRNONAA >89 05/05/2016 1012   GFRAA 91 07/26/2020 1600   GFRAA >89 05/05/2016 1012     Diabetic Labs (most recent): Lab Results  Component Value Date   HGBA1C 5.8 09/17/2022   HGBA1C 5.9 (H) 01/20/2022   HGBA1C 6.1 (H) 07/31/2021   MICROALBUR 0.4 11/13/2015   MICROALBUR 0.9 06/20/2014     Lipid Panel ( most recent) Lipid Panel     Component Value Date/Time   CHOL 202 (H) 05/26/2022 1615   TRIG 128 05/26/2022 1615   HDL 58 05/26/2022 1615   CHOLHDL 3.5 05/26/2022 1615   CHOLHDL 2.3 05/05/2016 1012   VLDL 13 05/05/2016 1012   LDLCALC 121 (H) 05/26/2022 1615      Thyroid ultrasound from 08/01/2016  IMPRESSION: 1. The previously biopsied dominant approximately 2.1 cm nodule within the right lobe of the thyroid is grossly unchanged compared to the 09/2011 examination. Correlation prior biopsy results is recommended. 2. None of the additional discretely measured thyroid nodules, the majority of which appear either spongiform or cystic in composition, meet imaging criteria to recommend percutaneous sampling or dedicated follow-up.    Repeat surveillance thyroid ultrasound on September 06, 2019 IMPRESSION: 1. Similar findings of multinodular goiter. No worrisome new or enlarging thyroid nodules. 2. Previously biopsied dominant nodule within mid aspect of the right lobe of the thyroid (labeled #1), is unchanged compared to the 2013 examination. Correlation previous biopsy results is advised. 3. None of the remaining thyroid nodules, many of which are  grossly unchanged since (at least) the 04/2014 examination, meet imaging criteria to recommend percutaneous sampling or continued dedicated follow-up.   Her recent bone density from August 22, 2022 [facility] L1-L2 08/22/2022 71.8 Normal -1.0 1.046 g/cm2 0.2% - [facility] L1-L2 07/14/2019 68.7 Normal -1.0 1.044 g/cm2 2.9% - [facility] L1-L2 08/01/2016 65.7 Osteopenia -1.2 1.015 g/cm2 -0.5% - [facility]  Spine L1-L2 08/10/2013 62.7 Osteopenia -1.2 1.020 g/cm2 2.6% -  DualFemur Total Right 08/22/2022 71.8 Osteopenia -1.5 0.822 g/cm2 0.7% - DualFemur Total Right 07/14/2019 68.7 Osteopenia -1.5 0.816 g/cm2 4.5% Yes DualFemur Total Right 08/01/2016 65.7 Osteopenia -1.8 0.781 g/cm2 -1.5% - DualFemur Total Right 08/10/2013 62.7 Osteopenia -1.7 0.793 g/cm2    Assessment & Plan:   1. Nontoxic multinodular goiter -She has stable euthyroid multinodular goiter-ultrasound imaging studies between 2013-2021.  She is status post fine-needle aspiration of a nodule in the right lobe of her thyroid with benign outcomes in 2013.  Her previsit thyroid function tests are consistent with euthyroid state.  She will not require any intervention with thyroid hormone or antithyroid medications.     She will have repeat thyroid function test and office visit in 1 year.    2. Diabetes mellitus without complication (HCC) -Her point-of-care A1c is 5.8% improving from 6.1%.  She will not need medication intervention for this at this time.    3.  Osteopenia-patient is not on antiosteoporosis treatment.  Her bone density studies are reassuring for now.  She will have repeat bone density in February 2026.    She is encouraged to stay on her atorvastatin 10 mg 3 days a week to control her severe dyslipidemia. - I advised patient to maintain close follow up with Fayrene Helper, MD for primary care needs.   I spent  21  minutes in the care of the patient today including review of labs from Thyroid Function, CMP, and other  relevant labs ; imaging/biopsy records (current and previous including abstractions from other facilities); face-to-face time discussing  her lab results and symptoms, medications doses, her options of short and long term treatment based on the latest standards of care / guidelines;   and documenting the encounter.  Berton Bon Kinney  participated in the discussions, expressed understanding, and voiced agreement with the above plans.  All questions were answered to her satisfaction. she is encouraged to contact clinic should she have any questions or concerns prior to her return visit.     Follow up plan: Return in about 1 year (around 09/17/2023) for Fasting Labs  in AM B4 8.  Glade Lloyd, MD Phone: [phone]  Fax: [phone]  -  This note was partially dictated with voice recognition software. Similar sounding words can be transcribed inadequately or may not  be corrected upon review.  09/17/2022, 3:29 PM

## 2022-09-19 ENCOUNTER — Ambulatory Visit: Payer: Medicare Other | Admitting: Gastroenterology

## 2022-09-19 ENCOUNTER — Encounter: Payer: Self-pay | Admitting: Gastroenterology

## 2022-09-19 ENCOUNTER — Telehealth: Payer: Self-pay | Admitting: *Deleted

## 2022-09-19 VITALS — BP 148/90 | HR 69 | Temp 98.2°F | Ht 67.0 in | Wt 131.6 lb

## 2022-09-19 DIAGNOSIS — R1084 Generalized abdominal pain: Secondary | ICD-10-CM | POA: Diagnosis not present

## 2022-09-19 DIAGNOSIS — K5909 Other constipation: Secondary | ICD-10-CM

## 2022-09-19 DIAGNOSIS — R140 Abdominal distension (gaseous): Secondary | ICD-10-CM

## 2022-09-19 DIAGNOSIS — K219 Gastro-esophageal reflux disease without esophagitis: Secondary | ICD-10-CM

## 2022-09-19 NOTE — Telephone Encounter (Signed)
[employer] PA: CPT Code [id number] Description: CT ABDOMEN & PELVIS W/ Case Number: [id number] Review Date: 09/19/2022 9:40:55 AM Expiration Date: N/A Status: This member's benefit plan did not require a prior authorization for this request.

## 2022-09-19 NOTE — Telephone Encounter (Signed)
Pt informed of CT is scheduled for 10/01/22 at [facility], arrive at 3:45 pm to check in and NPO after 2pm. Verbalized understanding.

## 2022-09-19 NOTE — Patient Instructions (Addendum)
Continue pepcid daily as needed for heartburn. Take colace (docusate sodium) 200 mg at bedtime.  If you go more than 48 hours without a bowel movement, then take Linzess 238mcg or glycerin suppository.  Keep a journal, document each day how many bowel movements, are they soft, hard, liquid, and if you take something other than stool softener (colace) to help you go. Drop off at our office after four weeks.  Call Tammy at [phone] and let us know how your bowels are doing in 2-3 weeks. Call with any questions or concerns.  CT of the abdomen/pelvis to be scheduled.  Please monitor your blood pressure at home, if persists more than 140/90, please contact your PCP. You can try IBGard for bloating, constipation per package instructions.  You could also add a probiotic for the next 4 weeks, [place], [place] are good options.  Limit foods from the list below as they may be contributing to gas and bloating.   Foods to avoid Fruits Fresh, dried, and juiced forms of apple, pear, watermelon, peach, plum, cherries, apricots, blackberries, boysenberries, figs, nectarines, and mango. Avocado. Vegetables Chicory root, artichoke, asparagus, cabbage, snow peas, Brussels sprouts, broccoli, sugar snap peas, mushrooms, celery, and cauliflower. Onions, garlic, leeks, and the white part of scallions. Grains Wheat, including kamut, durum, and semolina. Barley and bulgur. Couscous. Wheat-based cereals. Wheat noodles, bread, crackers, and pastries. Meats and other proteins Fried or fatty meat. Sausage. Cashews and pistachios. Soybeans, baked beans, black beans, chickpeas, kidney beans, fava beans, navy beans, lentils, black-eyed peas, and split peas. Dairy Milk, yogurt, ice cream, and soft cheese. Cream and sour cream. Milk-based sauces. Custard. Buttermilk. Soy milk. Seasoning and other foods Any sugar-free gum or candy. Foods that contain artificial sweeteners such as sorbitol, mannitol,  isomalt, or xylitol. Foods that contain honey, high-fructose corn syrup, or agave. Bouillon, vegetable stock, beef stock, and chicken stock. Garlic and onion powder. Condiments made with onion, such as hummus, chutney, pickles, relish, salad dressing, and salsa. Tomato paste. Beverages Chicory-based drinks. Coffee substitutes. Chamomile tea. Fennel tea. Sweet or fortified wines such as port or sherry. Diet soft drinks made with isomalt, mannitol, maltitol, sorbitol, or xylitol. Apple, pear, and mango juice. Juices with high-fructose corn syrup. The items listed above may not be a complete list of foods and beverages you should avoid. Contact a dietitian for more information.

## 2022-09-19 NOTE — Progress Notes (Signed)
GI Office Note    Referring Provider: Fayrene Helper, MD Primary Care Physician:  Fayrene Helper, MD  Primary Gastroenterologist: Garfield Cornea, MD   Chief Complaint   Chief Complaint  Patient presents with   Constipation    Having issues with constipation, gas, bloating and occasional diarrhea.    History of Present Illness   Tamara Lam is a 72 y.o. female presenting today for follow-up.  Last seen in August for history of GERD, constipation.  Constipation: in the past she typically was using MiraLAX and stool softeners more regularly.  Uses Linzess only as needed, hesitant due to her activities. States in early Feb she has some tooth issues. Took course of amoxicillin. Few weeks later, seems her bowels were not doing as well. Having more issues with constipation, feeling like stools incomplete. Also with some intermittent loose stools. [place] 1 to [place] 5. She has been having increased bloating and abdominal distention. Typically will take colace (2) if she had not felt like stools are good. If needed, then she may use glycerin suppository. Takes linzess 247mcg about twice per week. No longer on miralax. Drinks cup of coffee every morning, water throughout day, and one soft drink per day. No melena, brbpr. She has some mucous in the stool.   GERD: Using Pepcid twice daily as needed. Intermittent symptoms. Usually related to certain foods. Pepcid usually works.    Colonoscopy 06/2021: -non-bleeding internal hemorrhoids -Diverticulosis in the entire examined colon. -The examination was otherwise normal on direct and retroflexion views. - No specimens collected. -next colonoscopy in five years.   EGD 02/2018: -scattered antral erosions/erythema -bx with mild inflammation, no h.pylori    Medications   Current Outpatient Medications  Medication Sig Dispense Refill   Calcium-Magnesium-Vitamin D (CALCIUM 1200+D3 PO) Take 1 tablet by mouth daily.      ciclopirox (PENLAC) 8 % solution Apply topically at bedtime. Apply over nail and surrounding skin. Apply daily over previous coat. After seven (7) days, may remove with alcohol and continue cycle. 6.6 mL 0   escitalopram (LEXAPRO) 10 MG tablet Take 1 tablet (10 mg total) by mouth at bedtime. 90 tablet 3   famotidine (PEPCID) 20 MG tablet Take 20 mg by mouth daily as needed for heartburn.     fluticasone (FLONASE) 50 MCG/ACT nasal spray Use 2 sprays in each nostril once daily 16 g 3   linaclotide (LINZESS) 290 MCG CAPS capsule Take 1 capsule (290 mcg total) by mouth daily before breakfast. TAKE 1 CAPSULE (290 mcg total) BY MOUTH  DAILY BEFORE BREAKFAST 90 capsule 3   montelukast (SINGULAIR) 10 MG tablet Take 1 tablet (10 mg total) by mouth at bedtime. 30 tablet 3   Multiple Vitamins-Minerals (CENTRUM ADULTS) TABS Take 1 tablet by mouth daily.     simvastatin (ZOCOR) 10 MG tablet Take one tablet by mouth every Monday, Wednesday and Friday 36 tablet 3   travoprost, benzalkonium, (TRAVATAN) 0.004 % ophthalmic solution Place 1 drop into both eyes at bedtime.     vitamin C (ASCORBIC ACID) 500 MG tablet Take 500 mg by mouth daily.     No current facility-administered medications for this visit.    Allergies   Allergies as of 09/19/2022   (No Known Allergies)     Review of Systems   General: Negative for anorexia, weight loss, fever, chills, fatigue, weakness. ENT: Negative for hoarseness, difficulty swallowing , nasal congestion. CV: Negative for chest pain, angina, palpitations, dyspnea on exertion,  peripheral edema.  Respiratory: Negative for dyspnea at rest, dyspnea on exertion, cough, sputum, wheezing.  GI: See history of present illness. GU:  Negative for dysuria, hematuria, urinary incontinence, urinary frequency, nocturnal urination.  Endo: Negative for unusual weight change.     Physical Exam   BP (!) 149/90 (BP Location: Right Arm, Patient Position: Sitting, Cuff Size: Normal)    Pulse 69   Temp 98.2 F (36.8 C) (Oral)   Ht 5\' 7"  (1.702 m)   Wt 131 lb 9.6 oz (59.7 kg)   SpO2 96%   BMI 20.61 kg/m    General: Well-nourished, well-developed in no acute distress.  Eyes: No icterus. Mouth: Oropharyngeal mucosa moist and pink   Abdomen:  no hepatosplenomegaly or masses, no abdominal bruits or hernia , no rebound or guarding. Mild discomfort to deep palpation, some mild distention, slightly tympanic bowel sounds.  Rectal: not performed Extremities: No lower extremity edema. No clubbing or deformities. Neuro: Alert and oriented x 4   Skin: Warm and dry, no jaundice.   Psych: Alert and cooperative, normal mood and affect.  Labs   Lab Results  Component Value Date   HGBA1C 5.8 09/17/2022   Lab Results  Component Value Date   TSH 1.940 09/11/2022   Lab Results  Component Value Date   ALT 13 05/26/2022   AST 21 05/26/2022   ALKPHOS 50 05/26/2022   BILITOT 0.4 05/26/2022   Lab Results  Component Value Date   CREATININE 0.71 11/20/2021   BUN 13 11/20/2021   NA 144 11/20/2021   K 4.6 11/20/2021   CL 101 11/20/2021   CO2 27 11/20/2021   Lab Results  Component Value Date   WBC 6.5 11/20/2021   HGB 13.2 11/20/2021   HCT 39.8 11/20/2021   MCV 88 11/20/2021   PLT 280 11/20/2021    Imaging Studies   DG Bone Density  Result Date: 08/22/2022 EXAM: DUAL X-RAY ABSORPTIOMETRY (DXA) FOR BONE MINERAL DENSITY IMPRESSION: Your patient Tamara Lam completed a BMD test on 08/22/2022 using the [place] (software version: 14.10) manufactured by [employer]. The following summarizes the results of our evaluation. Technologist: [facility] PATIENT BIOGRAPHICAL: Name: Tamara Lam, Tamara Lam Patient ID:  [id number] Birth Date: 1951/03/22 Height:     67.0 in. Gender:      Female Exam Date:  08/22/2022 Weight:     129.1 lbs. Indications: Partial Hysterectomy, Follow up Osteopenia, Secondary Osteoporosis, Height Loss, Post Menopausal, Low Body Weight Fractures:  Treatments: Calcium, Vitamin D, Multivitamin DENSITOMETRY RESULTS: Site         Region      Measured Date Measured Age WHO Classification Young Adult T-score BMD         %Change vs. Previous Significant Change (*) AP Spine L1-L2 08/22/2022 71.8 Normal -1.0 1.046 g/cm2 0.2% - AP Spine L1-L2 07/14/2019 68.7 Normal -1.0 1.044 g/cm2 2.9% - AP Spine L1-L2 08/01/2016 65.7 Osteopenia -1.2 1.015 g/cm2 -0.5% - AP Spine L1-L2 08/10/2013 62.7 Osteopenia -1.2 1.020 g/cm2 2.6% - AP Spine L1-L2 06/19/2011 60.6 Osteopenia -1.4 0.994 g/cm2 -5.3% Yes AP Spine L1-L2 12/20/2003 53.1 Normal -1.0 1.050 g/cm2 - - DualFemur Total Right 08/22/2022 71.8 Osteopenia -1.5 0.822 g/cm2 0.7% - DualFemur Total Right 07/14/2019 68.7 Osteopenia -1.5 0.816 g/cm2 4.5% Yes DualFemur Total Right 08/01/2016 65.7 Osteopenia -1.8 0.781 g/cm2 -1.5% - DualFemur Total Right 08/10/2013 62.7 Osteopenia -1.7 0.793 g/cm2 1.3% - DualFemur Total Right 06/19/2011 60.6 Osteopenia -1.8 0.783 g/cm2 -10.9% Yes DualFemur Total Right 12/20/2003 53.1  Normal -1.0 0.879 g/cm2 - - DualFemur Total Mean 08/22/2022 71.8 Osteopenia -1.3 0.844 g/cm2 0.4% - DualFemur Total Mean 07/14/2019 68.7 Osteopenia -1.3 0.841 g/cm2 2.9% Yes DualFemur Total Mean 08/01/2016 65.7 Osteopenia -1.5 0.817 g/cm2 0.6% - DualFemur Total Mean 08/10/2013 62.7 Osteopenia -1.6 0.812 g/cm2 1.2% - DualFemur Total Mean 06/19/2011 60.6 Osteopenia -1.6 0.802 g/cm2 -9.4% Yes DualFemur Total Mean 12/20/2003 53.1 Normal -1.0 0.885 g/cm2 - - Left Forearm Radius 33% 08/22/2022 71.8 Normal -0.5 0.677 g/cm2 - - ASSESSMENT: The BMD measured at Femur Total Right is 0.822 g/cm2 with a T-score of -1.5. This patient is considered osteopenic according to [facility]) criteria. The scan quality is good. Compared with the prior study on 07/14/19, the BMD of the total mean shows no statistically significant change. L3 and L4 were excluded due to advanced degenerative changes. World Pharmacologist [facility])  criteria for post-menopausal, Caucasian Women: Normal:       T-score at or above -1 SD Osteopenia:   T-score between -1 and -2.5 SD Osteoporosis: T-score at or below -2.5 SD RECOMMENDATIONS: 1. All patients should optimize calcium and vitamin D intake. 2. Consider FDA-approved medical therapies in postmenopausal women and med aged 61 years and older, based on the following: a. A hip or vertebral (clinical or morphometric) fracture b. T-score< -2.5 at the femoral neck or spine after appropriate evaluation to exclude secondary causes c. Low bone mass (T-score between -1.0 and -2.5 at the femoral neck or spine) and a 10-year probability of a hip fracture > 3% or a 10-year probability of a major osteoporosis-related fracture > 20% based on the [place]-adapted WHO algorithm d. Clinician judgment and/or patient preferences may indicate treatment for people with 10-year fracture probabilities above or below these levels FOLLOW-UP: Patients with diagnosis of osteoporosis or at high risk for fracture should have regular bone mineral density tests. For patients eligible for Medicare, routine testing is allowed once every 2 years. The testing frequency can be increased to one year for patients who have rapidly progressing disease, those who are receiving or discontinuing medical therapy to restore bone mass, or have additional risk factors. I have reviewed this report, and agree with the above findings. [facility] Your patient Tamara Lam completed a FRAX assessment on 08/22/2022 using the [place] (analysis version: 14.10) manufactured by [employer]. The following summarizes the results of our evaluation. PATIENT BIOGRAPHICAL: Name: Tamara Lam, Tamara Lam Patient ID: [id number] Birth Date: Jan 11, 1951 Height:    67.0 in. Gender:     [age]    [age]       Weight:    129.1 lbs. Ethnicity:  Black                            Exam Date: 08/22/2022 FRAX* RESULTS:  (version: 3.5) 10-year Probability  of Fracture1 Major Osteoporotic Fracture2 Hip Fracture 3.4% 0.4% Population: [place] (Black) Risk Factors: Secondary Osteoporosis Based on Femur (Left) Neck BMD 1 -The 10-year probability of fracture may be lower than reported if the patient has received treatment. 2 -Major Osteoporotic Fracture: Clinical Spine, Forearm, Hip or Shoulder *FRAX is a Materials engineer of the [employer] for Metabolic Bone Disease, a [place] (WHO) [facility]. ASSESSMENT: The probability of a major osteoporotic fracture is 3.4% within the next ten years. The probability of a hip fracture is 0.4% within the next ten  years. Electronically Signed   By: Ammie Ferrier M.D.   On: 08/22/2022 13:57    Assessment   GERD: doing well on current regimen. Reinforced antireflux measures.   Constipation: no daily regimen. Stools ranging from [facility] 1 to 5. Recent antibiotic use may have exacerbated her symptoms.   Abdominal distention/discomfort: complains of distention/bloating often not getting relief with BM. Makes her feels that her stools are not effectively moving. No weight loss. Appetite stable. Colonoscopy up to date. Consider CT imaging to rule out partial obstruction, underlying malignancy etc.   The patient was found to have elevated blood pressure when vital signs were checked in the office. The blood pressure was rechecked by the nursing staff and it was found be persistently elevated >140/90 mmHg. I personally advised to the patient to follow up closely with his PCP for hypertension control.   PLAN   Continue Pepcid daily as needed. Take docusate sodium 200 mg every night.  If she goes more than 48 hours without a bowel movement, she should take either glycerin suppository or Linzess. She will call in 2 to 3 weeks with a progress report regarding bowel movements.  She will keep a journal documenting daily stools, stool consistency, medication used for constipation.   She will bring [place] stool journal in 4 weeks. Limit high FODMAP foods. Try IBgard per package instructions. Probiotic for 4 weeks. CT abdomen pelvis with contrast.   Laureen Ochs. Bobby Rumpf, [place], [facility]

## 2022-10-01 ENCOUNTER — Ambulatory Visit (HOSPITAL_BASED_OUTPATIENT_CLINIC_OR_DEPARTMENT_OTHER)
Admission: RE | Admit: 2022-10-01 | Discharge: 2022-10-01 | Disposition: A | Discharge status: Home / Self Care | Payer: Medicare Other | Source: Ambulatory Visit | Attending: Gastroenterology | Admitting: Gastroenterology

## 2022-10-01 DIAGNOSIS — K5730 Diverticulosis of large intestine without perforation or abscess without bleeding: Secondary | ICD-10-CM | POA: Diagnosis not present

## 2022-10-01 DIAGNOSIS — R1084 Generalized abdominal pain: Secondary | ICD-10-CM | POA: Insufficient documentation

## 2022-10-01 DIAGNOSIS — R140 Abdominal distension (gaseous): Secondary | ICD-10-CM | POA: Insufficient documentation

## 2022-10-01 DIAGNOSIS — K219 Gastro-esophageal reflux disease without esophagitis: Secondary | ICD-10-CM | POA: Diagnosis not present

## 2022-10-01 DIAGNOSIS — K5909 Other constipation: Secondary | ICD-10-CM | POA: Diagnosis not present

## 2022-10-01 DIAGNOSIS — N200 Calculus of kidney: Secondary | ICD-10-CM | POA: Diagnosis not present

## 2022-10-01 DIAGNOSIS — R109 Unspecified abdominal pain: Secondary | ICD-10-CM | POA: Diagnosis not present

## 2022-10-01 LAB — POCT I-STAT CREATININE: Creatinine, Ser: 0.7 mg/dL (ref 0.44–1.00)

## 2022-10-01 MED ORDER — IOHEXOL 300 MG/ML  SOLN
75.0000 mL | Freq: Once | INTRAMUSCULAR | Status: AC | PRN
  Administered 2022-10-01: 75 mL via INTRAVENOUS

## 2022-10-07 ENCOUNTER — Ambulatory Visit
Admission: RE | Admit: 2022-10-07 | Discharge: 2022-10-07 | Disposition: A | Discharge status: Home / Self Care | Payer: Medicare Other | Source: Ambulatory Visit | Attending: Neurology | Admitting: Neurology

## 2022-10-07 DIAGNOSIS — R202 Paresthesia of skin: Secondary | ICD-10-CM

## 2022-10-07 DIAGNOSIS — R292 Abnormal reflex: Secondary | ICD-10-CM

## 2022-10-10 ENCOUNTER — Telehealth: Payer: Self-pay | Admitting: Anesthesiology

## 2022-10-10 DIAGNOSIS — R202 Paresthesia of skin: Secondary | ICD-10-CM

## 2022-10-10 DIAGNOSIS — R292 Abnormal reflex: Secondary | ICD-10-CM

## 2022-10-10 NOTE — Telephone Encounter (Signed)
Pt called requesting MRI results °

## 2022-10-13 NOTE — Telephone Encounter (Signed)
Patient advised of results,  Please let pt know that MRI lumbar spine looks good, mild age-related changes, but no narrowing or stenosis to explain her feet symptoms.  I recommend that we check MRI cervical spine wo contrast next.  Dx: hyperreflexia. Thanks.   Patient agrees to the MRI Cervical Spine.  Order place location given to patient.

## 2022-10-15 ENCOUNTER — Other Ambulatory Visit: Payer: Self-pay

## 2022-10-15 DIAGNOSIS — R292 Abnormal reflex: Secondary | ICD-10-CM

## 2022-11-05 DIAGNOSIS — Z961 Presence of intraocular lens: Secondary | ICD-10-CM | POA: Diagnosis not present

## 2022-11-05 DIAGNOSIS — H401131 Primary open-angle glaucoma, bilateral, mild stage: Secondary | ICD-10-CM | POA: Diagnosis not present

## 2022-11-05 LAB — HM DIABETES EYE EXAM

## 2022-12-01 ENCOUNTER — Ambulatory Visit
Admission: RE | Admit: 2022-12-01 | Discharge: 2022-12-01 | Disposition: A | Discharge status: Home / Self Care | Payer: Medicare Other | Source: Ambulatory Visit | Attending: Neurology | Admitting: Neurology

## 2022-12-01 DIAGNOSIS — R292 Abnormal reflex: Secondary | ICD-10-CM

## 2022-12-01 DIAGNOSIS — M47812 Spondylosis without myelopathy or radiculopathy, cervical region: Secondary | ICD-10-CM | POA: Diagnosis not present

## 2022-12-01 DIAGNOSIS — M4802 Spinal stenosis, cervical region: Secondary | ICD-10-CM | POA: Diagnosis not present

## 2022-12-16 DIAGNOSIS — L814 Other melanin hyperpigmentation: Secondary | ICD-10-CM | POA: Diagnosis not present

## 2022-12-16 DIAGNOSIS — L259 Unspecified contact dermatitis, unspecified cause: Secondary | ICD-10-CM | POA: Diagnosis not present

## 2022-12-16 DIAGNOSIS — R202 Paresthesia of skin: Secondary | ICD-10-CM | POA: Diagnosis not present

## 2022-12-16 DIAGNOSIS — D225 Melanocytic nevi of trunk: Secondary | ICD-10-CM | POA: Diagnosis not present

## 2022-12-16 DIAGNOSIS — L821 Other seborrheic keratosis: Secondary | ICD-10-CM | POA: Diagnosis not present

## 2022-12-16 DIAGNOSIS — L309 Dermatitis, unspecified: Secondary | ICD-10-CM | POA: Diagnosis not present

## 2022-12-16 DIAGNOSIS — L400 Psoriasis vulgaris: Secondary | ICD-10-CM | POA: Diagnosis not present

## 2022-12-17 ENCOUNTER — Other Ambulatory Visit: Payer: Self-pay

## 2022-12-17 DIAGNOSIS — R292 Abnormal reflex: Secondary | ICD-10-CM

## 2022-12-17 DIAGNOSIS — R202 Paresthesia of skin: Secondary | ICD-10-CM

## 2022-12-17 DIAGNOSIS — M5410 Radiculopathy, site unspecified: Secondary | ICD-10-CM

## 2022-12-22 ENCOUNTER — Other Ambulatory Visit: Payer: Medicare Other

## 2022-12-27 ENCOUNTER — Ambulatory Visit
Admission: RE | Admit: 2022-12-27 | Discharge: 2022-12-27 | Disposition: A | Discharge status: Home / Self Care | Payer: Medicare Other | Source: Ambulatory Visit | Attending: Neurology | Admitting: Neurology

## 2022-12-27 DIAGNOSIS — M5410 Radiculopathy, site unspecified: Secondary | ICD-10-CM

## 2022-12-27 DIAGNOSIS — M5124 Other intervertebral disc displacement, thoracic region: Secondary | ICD-10-CM | POA: Diagnosis not present

## 2022-12-27 DIAGNOSIS — R202 Paresthesia of skin: Secondary | ICD-10-CM

## 2022-12-27 DIAGNOSIS — R292 Abnormal reflex: Secondary | ICD-10-CM

## 2022-12-29 ENCOUNTER — Other Ambulatory Visit (INDEPENDENT_AMBULATORY_CARE_PROVIDER_SITE_OTHER): Payer: Medicare Other

## 2022-12-29 DIAGNOSIS — R202 Paresthesia of skin: Secondary | ICD-10-CM | POA: Diagnosis not present

## 2022-12-29 DIAGNOSIS — R292 Abnormal reflex: Secondary | ICD-10-CM

## 2022-12-29 DIAGNOSIS — M5410 Radiculopathy, site unspecified: Secondary | ICD-10-CM

## 2022-12-29 LAB — VITAMIN B12: Vitamin B-12: 633 pg/mL (ref 211–911)

## 2022-12-30 LAB — EXTRA SPECIMEN

## 2023-01-02 LAB — EXTRA SPECIMEN

## 2023-01-02 LAB — VITAMIN E

## 2023-01-02 LAB — ZINC: Zinc: 85 ug/dL (ref 60–130)

## 2023-01-02 LAB — CERULOPLASMIN: Ceruloplasmin: 22 mg/dL (ref 14–48)

## 2023-01-02 LAB — COPPER, SERUM: Copper: 95 ug/dL (ref 70–175)

## 2023-01-04 ENCOUNTER — Other Ambulatory Visit: Payer: Medicare Other

## 2023-01-09 DIAGNOSIS — E559 Vitamin D deficiency, unspecified: Secondary | ICD-10-CM | POA: Diagnosis not present

## 2023-01-09 DIAGNOSIS — E785 Hyperlipidemia, unspecified: Secondary | ICD-10-CM | POA: Diagnosis not present

## 2023-01-10 LAB — CMP14+EGFR
ALT: 15 IU/L (ref 0–32)
Albumin: 4.3 g/dL (ref 3.8–4.8)
BUN: 15 mg/dL (ref 8–27)
Creatinine, Ser: 0.66 mg/dL (ref 0.57–1.00)
Potassium: 4 mmol/L (ref 3.5–5.2)
Sodium: 142 mmol/L (ref 134–144)

## 2023-01-10 LAB — CBC
Hemoglobin: 12.9 g/dL (ref 11.1–15.9)
RBC: 4.58 x10E6/uL (ref 3.77–5.28)
RDW: 12.9 % (ref 11.7–15.4)

## 2023-01-10 LAB — LIPID PANEL
HDL: 59 mg/dL (ref >39)
Triglycerides: 106 mg/dL (ref 0–149)

## 2023-01-11 LAB — LIPID PANEL
Chol/HDL Ratio: 3.4 ratio (ref 0.0–4.4)
Cholesterol, Total: 198 mg/dL (ref 100–199)
LDL Chol Calc (NIH): 120 mg/dL — ABNORMAL HIGH (ref 0–99)
VLDL Cholesterol Cal: 19 mg/dL (ref 5–40)

## 2023-01-11 LAB — CBC
Hematocrit: 40.1 % (ref 34.0–46.6)
MCH: 28.2 pg (ref 26.6–33.0)
MCHC: 32.2 g/dL (ref 31.5–35.7)
MCV: 88 fL (ref 79–97)
Platelets: 186 10*3/uL (ref 150–450)
WBC: 5.6 10*3/uL (ref 3.4–10.8)

## 2023-01-11 LAB — CMP14+EGFR
AST: 17 IU/L (ref 0–40)
Alkaline Phosphatase: 55 IU/L (ref 44–121)
BUN/Creatinine Ratio: 23 (ref 12–28)
Bilirubin Total: 0.3 mg/dL (ref 0.0–1.2)
CO2: 26 mmol/L (ref 20–29)
Calcium: 9.6 mg/dL (ref 8.7–10.3)
Chloride: 102 mmol/L (ref 96–106)
Globulin, Total: 2.4 g/dL (ref 1.5–4.5)
Glucose: 90 mg/dL (ref 70–99)
Total Protein: 6.7 g/dL (ref 6.0–8.5)
eGFR: 93 mL/min/{1.73_m2} (ref >59)

## 2023-01-11 LAB — VITAMIN D 25 HYDROXY (VIT D DEFICIENCY, FRACTURES): Vit D, 25-Hydroxy: 49.5 ng/mL (ref 30.0–100.0)

## 2023-01-12 ENCOUNTER — Ambulatory Visit (INDEPENDENT_AMBULATORY_CARE_PROVIDER_SITE_OTHER): Payer: Medicare Other

## 2023-01-12 VITALS — Ht 67.0 in | Wt 130.0 lb

## 2023-01-12 DIAGNOSIS — Z0000 Encounter for general adult medical examination without abnormal findings: Secondary | ICD-10-CM

## 2023-01-12 DIAGNOSIS — Z1231 Encounter for screening mammogram for malignant neoplasm of breast: Secondary | ICD-10-CM

## 2023-01-12 NOTE — Progress Notes (Signed)
 Because this visit was a virtual/telehealth visit,  certain criteria was not obtained, such a blood pressure, CBG if patient is a diabetic, and timed up and go. Any medications not marked as "taking" was not mentioned during the medication reconciliation part of the visit. Any vitals not documented were not able to be obtained due to this being a telehealth visit. Vitals documented are verbally provided by the patient.   Subjective:   Tamara Lam is a 72 y.o. female who presents for Medicare Annual (Subsequent) preventive examination.  Visit Complete: Virtual  I connected with  Tamara Lam on 01/12/23 by a audio enabled telemedicine application and verified that I am speaking with the correct person using two identifiers.  Patient Location: Home  Provider Location: Home Office  I discussed the limitations of evaluation and management by telemedicine. The patient expressed understanding and agreed to proceed.  Patient Medicare AWV questionnaire was completed by the patient on n/a; I have confirmed that all information answered by patient is correct and no changes since this date.  Review of Systems     Cardiac Risk Factors include: advanced age (>49men, >31 women);dyslipidemia     Objective:    Today's Vitals   01/12/23 1019  Weight: 130 lb (59 kg)  Height: 5\' 7"  (1.702 m)   Body mass index is 20.36 kg/m.     01/12/2023   10:19 AM 09/15/2022   11:15 AM 12/23/2021    1:33 PM 07/08/2021    8:44 AM 02/04/2021    9:58 AM 12/19/2020    4:01 PM 12/19/2019    2:16 PM  Advanced Directives  Does Patient Have a Medical Advance Directive? No Yes Yes Yes Yes Yes Yes  Type of Furniture conservator/restorer;Living will;Out of facility DNR (pink MOST or yellow form) Healthcare Power of [place];Living will Living will Healthcare Power of [place];Living will Healthcare Power of [place];Living will   Does patient want to make changes to medical advance directive?     No -  Patient declined No - Patient declined No - Patient declined  Copy of Healthcare Power of Attorney in Chart?   Yes - validated most recent copy scanned in chart (See row information)  No - copy requested No - copy requested   Would patient like information on creating a medical advance directive? No - Patient declined          Current Medications (verified) Outpatient Encounter Medications as of 01/12/2023  Medication Sig   Calcium-Magnesium-Vitamin D (CALCIUM 1200+D3 PO) Take 1 tablet by mouth daily.   escitalopram (LEXAPRO) 10 MG tablet Take 1 tablet (10 mg total) by mouth at bedtime.   famotidine (PEPCID) 20 MG tablet Take 20 mg by mouth daily as needed for heartburn.   fluticasone (FLONASE) 50 MCG/ACT nasal spray Use 2 sprays in each nostril once daily   linaclotide (LINZESS) 290 MCG CAPS capsule Take 1 capsule (290 mcg total) by mouth daily before breakfast. TAKE 1 CAPSULE (290 mcg total) BY MOUTH  DAILY BEFORE BREAKFAST   montelukast (SINGULAIR) 10 MG tablet Take 1 tablet (10 mg total) by mouth at bedtime.   Multiple Vitamins-Minerals (CENTRUM ADULTS) TABS Take 1 tablet by mouth daily.   simvastatin (ZOCOR) 10 MG tablet Take one tablet by mouth every Monday, Wednesday and Friday   travoprost, benzalkonium, (TRAVATAN) 0.004 % ophthalmic solution Place 1 drop into both eyes at bedtime.   triamcinolone (KENALOG) 0.025 % cream Apply 1 Application topically daily.  vitamin C (ASCORBIC ACID) 500 MG tablet Take 500 mg by mouth daily.   ciclopirox (PENLAC) 8 % solution Apply topically at bedtime. Apply over nail and surrounding skin. Apply daily over previous coat. After seven (7) days, may remove with alcohol and continue cycle. (Patient not taking: Reported on 01/12/2023)   No facility-administered encounter medications on file as of 01/12/2023.    Allergies (verified) Patient has no known allergies.   History: Past Medical History:  Diagnosis Date   Abnormal CXR (chest x-ray)     Evaluated with Pulmonary- Dr. Juanetta Gosling- "pt. was told probably due to thin body frame"-saw no issues-PFT test normal.   Anxiety 1980's    Arthritis    osteoarthritis -knees, hands   Chronic constipation    tx. Linzess   Chronic nausea    Glaucoma 2004   Dr. Harlon Flor, laser to left eye Sept 2011, bilateral eye drops for this   Helicobacter pylori gastritis 04/2004   last EGD    Hematochezia    Hx of colonoscopy 08/2005   Dr. Jena Gauss / hemorrhoids    Thyroid disease    thyroid goiter, nodules-no problems.   Weight loss    Past Surgical History:  Procedure Laterality Date   ABDOMINAL HYSTERECTOMY     arthoscopic surgery on right knee  2007   BIOPSY  03/03/2018   Procedure: BIOPSY;  Surgeon: Corbin Ade, MD;  Location: [facility];  Service: Endoscopy;;  gastric for h. pylori   CHOLECYSTECTOMY  1990's   COLONOSCOPY  2011   Dr. Jena Gauss: anal canal/internal hemorrhoids, dimintuive rectosigmoid polyp (polypoid rectal mucosa), few sigmoid diverticula    COLONOSCOPY N/A 10/30/2015   Dr. Jena Gauss: diverticulosis, hemorrhoids, surveillance in 2022    COLONOSCOPY WITH PROPOFOL N/A 07/08/2021   Procedure: COLONOSCOPY WITH PROPOFOL;  Surgeon: Corbin Ade, MD;  Location: [facility];  Service: Endoscopy;  Laterality: N/A;  10:00am   ESOPHAGOGASTRODUODENOSCOPY  2005   Dr. Jena Gauss: diffuse submucosal petechial hemorrhage of te gastric mucosa, some noncompliance of gastric cavity, failure to insufflate fully, multiple gastric biopsies. gastric bx, h.pylori   ESOPHAGOGASTRODUODENOSCOPY N/A 03/03/2018   Dr. Jena Gauss: scattered antral erosions/erythema no ulcer/mass. Bx: mild inflammation and No H.pylori   TOTAL KNEE ARTHROPLASTY Right 05/26/2016   Procedure: RIGHT TOTAL KNEE ARTHROPLASTY;  Surgeon: Ollen Gross, MD;  Location: [facility];  Service: Orthopedics;  Laterality: Right;   VESICOVAGINAL FISTULA CLOSURE W/ TAH  1989   Family History  Problem Relation Age of Onset   Hypertension Mother    Glaucoma  Mother    Diverticulosis Mother    Heart disease Mother    Lung cancer Father    Mental illness Sister    Depression Sister    Anxiety disorder Sister    Cancer Sister    Depression Sister    Anxiety disorder Sister    Depression Sister    Anxiety disorder Sister    Leukemia Brother        some form of leukemia   Depression Daughter    Colon polyps Son    Colon polyps Son        before age 48   Colon cancer Neg Hx    Social History   Socioeconomic History   Marital status: Widowed    Spouse name: Not on file   Number of children: Not on file   Years of education: Not on file   Highest education level: Not on file  Occupational History   Occupation: homemaker  Employer: UNEMPLOYED  Tobacco Use   Smoking status: Never   Smokeless tobacco: Never   Tobacco comments:    Never smoked  Vaping Use   Vaping status: Never Used  Substance and Sexual Activity   Alcohol use: No    Alcohol/week: 0.0 standard drinks of alcohol   Drug use: No   Sexual activity: Not Currently  Other Topics Concern   Not on file  Social History Narrative   Right Handed   Lives in a one story home with her son.    Social Determinants of Health   Financial Resource Strain: Low Risk  (01/12/2023)   Overall Financial Resource Strain (CARDIA)    Difficulty of Paying Living Expenses: Not hard at all  Food Insecurity: No Food Insecurity (01/12/2023)   Hunger Vital Sign    Worried About Running Out of Food in the Last Year: Never true    Ran Out of Food in the Last Year: Never true  Transportation Needs: No Transportation Needs (01/12/2023)   PRAPARE - Administrator, Civil Service (Medical): No    Lack of Transportation (Non-Medical): No  Physical Activity: Sufficiently Active (01/12/2023)   Exercise Vital Sign    Days of Exercise per Week: 7 days    Minutes of Exercise per Session: 60 min  Stress: No Stress Concern Present (01/12/2023)   [employer] of Occupational Health -  Occupational Stress Questionnaire    Feeling of Stress : Not at all  Social Connections: Socially Integrated (01/12/2023)   Social Connection and Isolation Panel [NHANES]    Frequency of Communication with Friends and Family: More than three times a week    Frequency of Social Gatherings with Friends and Family: More than three times a week    Attends Religious Services: More than 4 times per year    Active Member of [employer] or Organizations: Yes    Attends Engineer, structural: More than 4 times per year    Marital Status: Married    Tobacco Counseling Counseling given: Yes Tobacco comments: Never smoked   Clinical Intake:  Pre-visit preparation completed: Yes  Pain : No/denies pain     BMI - recorded: 20.36 Nutritional Status: BMI of 19-24  Normal Nutritional Risks: None Diabetes: No  How often do you need to have someone help you when you read instructions, pamphlets, or other written materials from your doctor or pharmacy?: 1 - Never  Interpreter Needed?: No  Information entered by ::  Nehemiah Montee, CMA   Activities of Daily Living    01/12/2023   10:30 AM  In your present state of health, do you have any difficulty performing the following activities:  Hearing? 0  Vision? 0  Difficulty concentrating or making decisions? 0  Walking or climbing stairs? 0  Dressing or bathing? 0  Doing errands, shopping? 0  Preparing Food and eating ? N  Using the Toilet? N  In the past six months, have you accidently leaked urine? N  Do you have problems with loss of bowel control? N  Managing your Medications? N  Managing your Finances? N  Housekeeping or managing your Housekeeping? N    Patient Care Team: Kerri Perches, MD as PCP - General Branch, Dorothe Pea, MD as PCP - Cardiology (Cardiology) Jena Gauss Gerrit Friends, MD as Consulting Physician (Gastroenterology) Roma Kayser, MD as Consulting Physician (Endocrinology) Glendale Chard, DO as Consulting  Physician (Neurology)  Indicate any recent Medical Services you may have received from other  than [facility] providers in the past year (date may be approximate).     Assessment:   This is a routine wellness examination for Tamara Lam.  Hearing/Vision screen Hearing Screening - Comments:: Patient denies any hearing difficulties.    Dietary issues and exercise activities discussed:     Goals Addressed               This Visit's Progress     Patient Stated (pt-stated)        To continue to take care of myself and remain as active as I currently am and maintain my health       Depression Screen    01/12/2023   10:28 AM 08/13/2022    2:50 PM 05/28/2022    1:08 PM 12/23/2021    1:34 PM 07/31/2021   10:02 AM 07/31/2021    9:16 AM 03/27/2021    4:06 PM  PHQ 2/9 Scores  PHQ - 2 Score 0 2 1 0 2 0 0  PHQ- 9 Score  5 4  8       Fall Risk    01/12/2023   10:28 AM 09/15/2022   11:14 AM 08/13/2022    2:48 PM 05/28/2022    1:08 PM 01/22/2022    1:28 PM  Fall Risk   Falls in the past year? 0 0 0 0 0  Number falls in past yr: 0 0 0 0 0  Injury with Fall? 0 0 0 0 0  Risk for fall due to : No Fall Risks  No Fall Risks    Follow up Falls prevention discussed Falls evaluation completed Falls evaluation completed      MEDICARE RISK AT HOME:  Medicare Risk at Home - 01/12/23 1024     Any stairs in or around the home? No    If so, are there any without handrails? No    Home free of loose throw rugs in walkways, pet beds, electrical cords, etc? Yes    Adequate lighting in your home to reduce risk of falls? Yes    Life alert? No    Use of a cane, walker or w/c? No    Grab bars in the bathroom? Yes    Shower chair or bench in shower? No    Elevated toilet seat or a handicapped toilet? No             TIMED UP AND GO:  Was the test performed?  No    Cognitive Function:        01/12/2023   10:24 AM 12/23/2021    1:36 PM 12/19/2019    2:21 PM 12/14/2018    1:18 PM 09/28/2017    3:12 PM   6CIT Screen  What Year? 0 points 0 points 0 points 0 points 0 points  What month? 0 points 0 points 0 points 0 points 0 points  What time? 0 points 0 points 0 points 0 points 0 points  Count back from 20 0 points 0 points 0 points 0 points 0 points  Months in reverse 0 points 0 points 0 points 0 points 0 points  Repeat phrase 0 points 0 points 0 points 2 points 0 points  Total Score 0 points 0 points 0 points 2 points 0 points    Immunizations Immunization History  Administered Date(s) Administered   Fluad Quad(high Dose 65+) 03/28/2019, 03/27/2021, 03/19/2022   Influenza Whole 03/13/2010, 03/12/2011   Influenza,inj,Quad PF,6+ Mos 04/24/2013, 02/20/2014, 03/27/2015, 04/28/2016, 04/14/2017, 02/24/2018, 02/20/2020   Moderna  Sars-Covid-2 Vaccination 08/04/2019, 09/02/2019, 05/14/2020, 01/31/2021   Pneumococcal Conjugate-13 06/20/2014   Pneumococcal Polysaccharide-23 05/12/2016   Td 08/16/2009   Tdap 12/19/2019   Zoster Recombinant(Shingrix) 04/03/2021, 08/10/2021   Zoster, Live 02/17/2011    TDAP status: Up to date  Flu Vaccine status: Up to date  Pneumococcal vaccine status: Up to date  Covid-19 vaccine status: Information provided on how to obtain vaccines.   Qualifies for Shingles Vaccine? Yes   Zostavax completed Yes   Shingrix Completed?: Yes  Screening Tests Health Maintenance  Topic Date Due   COVID-19 Vaccine (5 - 2023-24 season) 02/28/2022   Medicare Annual Wellness (AWV)  12/24/2022   INFLUENZA VACCINE  01/29/2023   MAMMOGRAM  03/06/2024   DTaP/Tdap/Td (3 - Td or Tdap) 12/18/2029   Colonoscopy  07/09/2031   Pneumonia Vaccine 77+ Years old  Completed   DEXA SCAN  Completed   Hepatitis C Screening  Completed   Zoster Vaccines- Shingrix  Completed   HPV VACCINES  Aged Out    Health Maintenance  Health Maintenance Due  Topic Date Due   COVID-19 Vaccine (5 - 2023-24 season) 02/28/2022   Medicare Annual Wellness (AWV)  12/24/2022    Colorectal cancer  screening: Type of screening: Colonoscopy. Completed 07/08/2021. Repeat every 10 years  Mammogram status: Ordered 01/12/2023. Pt provided with contact info and advised to call to schedule appt.   Bone Density status: Completed 08/22/2022. Results reflect: Bone density results: OSTEOPENIA. Repeat every 2-5 years.  Lung Cancer Screening: (Low Dose CT Chest recommended if Age 89-80 years, 20 pack-year currently smoking OR have quit w/in 15years.) does not qualify.   Additional Screening:  Hepatitis C Screening: does not qualify; Completed 11/05/2015  Vision Screening: Recommended annual ophthalmology exams for early detection of glaucoma and other disorders of the eye. Is the patient up to date with their annual eye exam?  Yes  Who is the provider or what is the name of the office in which the patient attends annual eye exams? Dr. Dione Booze If pt is not established with a provider, would they like to be referred to a provider to establish care? No .   Dental Screening: Recommended annual dental exams for proper oral hygiene  Diabetic Foot Exam: n/a  Community Resource Referral / Chronic Care Management: CRR required this visit?  No   CCM required this visit?  No     Plan:     I have personally reviewed and noted the following in the patient's chart:   Medical and social history Use of alcohol, tobacco or illicit drugs  Current medications and supplements including opioid prescriptions. Patient is not currently taking opioid prescriptions. Functional ability and status Nutritional status Physical activity Advanced directives List of other physicians Hospitalizations, surgeries, and ER visits in previous 12 months Vitals Screenings to include cognitive, depression, and falls Referrals and appointments  In addition, I have reviewed and discussed with patient certain preventive protocols, quality metrics, and best practice recommendations. A written personalized care plan for preventive  services as well as general preventive health recommendations were provided to patient.     Jordan Hawks Myson Levi, CMA   01/12/2023   After Visit Summary: (MyChart) Due to this being a telephonic visit, the after visit summary with patients personalized plan was offered to patient via MyChart   Nurse Notes: Mammogram ordered today. Patient aware to make her own appointment

## 2023-01-12 NOTE — Patient Instructions (Signed)
[name] , Thank you for taking time to come for your Medicare Wellness Visit. I appreciate your ongoing commitment to your health goals. Please review the following plan we discussed and let me know if I can assist you in the future.   These are the goals we discussed:  Goals       DIET - INCREASE WATER INTAKE      Drink more water- at least 3 bottles per day       Gain weight      Starting 07/23/2016 patient would like to gain 8 lbs to equal 125 lbs and be able to maintain that. Patient has achieved goal in 2021 at 131lbs      Patient Stated      Patient states that her goal is to gain a little weight.       Patient Stated (pt-stated)      To continue to take care of myself and remain as active as I currently am and maintain my health      Prevent falls        This is a list of the screening recommended for you and due dates:  Health Maintenance  Topic Date Due   COVID-19 Vaccine (5 - 2023-24 season) 02/28/2022   Flu Shot  01/29/2023   Medicare Annual Wellness Visit  01/12/2024   Mammogram  03/06/2024   DTaP/Tdap/Td vaccine (3 - Td or Tdap) 12/18/2029   Colon Cancer Screening  07/09/2031   Pneumonia Vaccine  Completed   DEXA scan (bone density measurement)  Completed   Hepatitis C Screening  Completed   Zoster (Shingles) Vaccine  Completed   HPV Vaccine  Aged Out    Advanced directives: Advance directive discussed with you today. Even though you declined this today, please call our office should you change your mind, and we can give you the proper paperwork for you to fill out. Advance care planning is a way to make decisions about medical care that fits your values in case you are ever unable to make these decisions for yourself.  Information on Advanced Care Planning can be found at [facility] Advance Health Care Directives Advance Health Care Directives ([email or web address]    Conditions/risks identified:  You have an order for:  []   2D Mammogram   [x]   3D Mammogram  []   Bone Density     Please call for appointment:   [facility] [address] [place], [place] [address] [phone]  Make sure to wear two-piece clothing.  No lotions powders or deodorants the day of the appointment Make sure to bring picture ID and insurance card.  Bring list of medications you are currently taking including any supplements.   Schedule your [place] screening mammogram through MyChart!   Log into your MyChart account.  Go to 'Visit' (or 'Appointments' if on mobile App) --> Schedule an Appointment  Under 'Select a Reason for Visit' choose the Mammogram Screening option.  Complete the pre-visit questions and select the time and place that best fits your schedule.   Next appointment: VIRTUAL/TELEPHONE APPOINTMENT Follow up in one year for your annual wellness visit  February 17, 2024 at 1:30 pm telephone visit.    Preventive Care 59 Years and Older, Female Preventive care refers to lifestyle choices and visits with your health care provider that can promote health and wellness. What does preventive care include? A yearly physical exam. This is also called an annual well check. Dental  exams once or twice a year. Routine eye exams. Ask your health care provider how often you should have your eyes checked. Personal lifestyle choices, including: Daily care of your teeth and gums. Regular physical activity. Eating a healthy diet. Avoiding tobacco and drug use. Limiting alcohol use. Practicing safe sex. Taking low-dose aspirin every day. Taking vitamin and mineral supplements as recommended by your health care provider. What happens during an annual well check? The services and screenings done by your health care provider during your annual well check will depend on your age, overall health, lifestyle risk factors, and family history of disease. Counseling  Your health care provider may ask you questions about  your: Alcohol use. Tobacco use. Drug use. Emotional well-being. Home and relationship well-being. Sexual activity. Eating habits. History of falls. Memory and ability to understand (cognition). Work and work Astronomer. Reproductive health. Screening  You may have the following tests or measurements: Height, weight, and BMI. Blood pressure. Lipid and cholesterol levels. These may be checked every 5 years, or more frequently if you are over 11 years old. Skin check. Lung cancer screening. You may have this screening every year starting at age 78 if you have a 30-pack-year history of smoking and currently smoke or have quit within the past 15 years. Fecal occult blood test (FOBT) of the stool. You may have this test every year starting at age 26. Flexible sigmoidoscopy or colonoscopy. You may have a sigmoidoscopy every 5 years or a colonoscopy every 10 years starting at age 75. Hepatitis C blood test. Hepatitis B blood test. Sexually transmitted disease (STD) testing. Diabetes screening. This is done by checking your blood sugar (glucose) after you have not eaten for a while (fasting). You may have this done every 1-3 years. Bone density scan. This is done to screen for osteoporosis. You may have this done starting at age 30. Mammogram. This may be done every 1-2 years. Talk to your health care provider about how often you should have regular mammograms. Talk with your health care provider about your test results, treatment options, and if necessary, the need for more tests. Vaccines  Your health care provider may recommend certain vaccines, such as: Influenza vaccine. This is recommended every year. Tetanus, diphtheria, and acellular pertussis (Tdap, Td) vaccine. You may need a Td booster every 10 years. Zoster vaccine. You may need this after age 21. Pneumococcal 13-valent conjugate (PCV13) vaccine. One dose is recommended after age 35. Pneumococcal polysaccharide (PPSV23) vaccine.  One dose is recommended after age 98. Talk to your health care provider about which screenings and vaccines you need and how often you need them. This information is not intended to replace advice given to you by your health care provider. Make sure you discuss any questions you have with your health care provider. Document Released: 07/13/2015 Document Revised: 03/05/2016 Document Reviewed: 04/17/2015 Elsevier Interactive Patient Education  2017 [employer].  Fall Prevention in the Home Falls can cause injuries. They can happen to people of all ages. There are many things you can do to make your home safe and to help prevent falls. What can I do on the outside of my home? Regularly fix the edges of walkways and driveways and fix any cracks. Remove anything that might make you trip as you walk through a door, such as a raised step or threshold. Trim any bushes or trees on the path to your home. Use bright outdoor lighting. Clear any walking paths of anything that might make someone  trip, such as rocks or tools. Regularly check to see if handrails are loose or broken. Make sure that both sides of any steps have handrails. Any raised decks and porches should have guardrails on the edges. Have any leaves, snow, or ice cleared regularly. Use sand or salt on walking paths during winter. Clean up any spills in your garage right away. This includes oil or grease spills. What can I do in the bathroom? Use night lights. Install grab bars by the toilet and in the tub and shower. Do not use towel bars as grab bars. Use non-skid mats or decals in the tub or shower. If you need to sit down in the shower, use a plastic, non-slip stool. Keep the floor dry. Clean up any water that spills on the floor as soon as it happens. Remove soap buildup in the tub or shower regularly. Attach bath mats securely with double-sided non-slip rug tape. Do not have throw rugs and other things on the floor that can make  you trip. What can I do in the bedroom? Use night lights. Make sure that you have a light by your bed that is easy to reach. Do not use any sheets or blankets that are too big for your bed. They should not hang down onto the floor. Have a firm chair that has side arms. You can use this for support while you get dressed. Do not have throw rugs and other things on the floor that can make you trip. What can I do in the kitchen? Clean up any spills right away. Avoid walking on wet floors. Keep items that you use a lot in easy-to-reach places. If you need to reach something above you, use a strong step stool that has a grab bar. Keep electrical cords out of the way. Do not use floor polish or wax that makes floors slippery. If you must use wax, use non-skid floor wax. Do not have throw rugs and other things on the floor that can make you trip. What can I do with my stairs? Do not leave any items on the stairs. Make sure that there are handrails on both sides of the stairs and use them. Fix handrails that are broken or loose. Make sure that handrails are as long as the stairways. Check any carpeting to make sure that it is firmly attached to the stairs. Fix any carpet that is loose or worn. Avoid having throw rugs at the top or bottom of the stairs. If you do have throw rugs, attach them to the floor with carpet tape. Make sure that you have a light switch at the top of the stairs and the bottom of the stairs. If you do not have them, ask someone to add them for you. What else can I do to help prevent falls? Wear shoes that: Do not have high heels. Have rubber bottoms. Are comfortable and fit you well. Are closed at the toe. Do not wear sandals. If you use a stepladder: Make sure that it is fully opened. Do not climb a closed stepladder. Make sure that both sides of the stepladder are locked into place. Ask someone to hold it for you, if possible. Clearly mark and make sure that you can  see: Any grab bars or handrails. First and last steps. Where the edge of each step is. Use tools that help you move around (mobility aids) if they are needed. These include: Canes. Walkers. Scooters. Crutches. Turn on the lights when you go  into a dark area. Replace any light bulbs as soon as they burn out. Set up your furniture so you have a clear path. Avoid moving your furniture around. If any of your floors are uneven, fix them. If there are any pets around you, be aware of where they are. Review your medicines with your doctor. Some medicines can make you feel dizzy. This can increase your chance of falling. Ask your doctor what other things that you can do to help prevent falls. This information is not intended to replace advice given to you by your health care provider. Make sure you discuss any questions you have with your health care provider. Document Released: 04/12/2009 Document Revised: 11/22/2015 Document Reviewed: 07/21/2014 Elsevier Interactive Patient Education  2017 [employer].

## 2023-01-13 ENCOUNTER — Encounter: Payer: Self-pay | Admitting: Family Medicine

## 2023-01-13 ENCOUNTER — Ambulatory Visit (INDEPENDENT_AMBULATORY_CARE_PROVIDER_SITE_OTHER): Payer: Medicare Other | Admitting: Family Medicine

## 2023-01-13 VITALS — BP 125/80 | HR 65 | Ht 67.0 in | Wt 129.1 lb

## 2023-01-13 DIAGNOSIS — M609 Myositis, unspecified: Secondary | ICD-10-CM | POA: Diagnosis not present

## 2023-01-13 DIAGNOSIS — F3341 Major depressive disorder, recurrent, in partial remission: Secondary | ICD-10-CM | POA: Diagnosis not present

## 2023-01-13 DIAGNOSIS — E785 Hyperlipidemia, unspecified: Secondary | ICD-10-CM

## 2023-01-13 DIAGNOSIS — G4733 Obstructive sleep apnea (adult) (pediatric): Secondary | ICD-10-CM

## 2023-01-13 DIAGNOSIS — E042 Nontoxic multinodular goiter: Secondary | ICD-10-CM

## 2023-01-13 NOTE — Assessment & Plan Note (Signed)
Managed by Endo 

## 2023-01-13 NOTE — Assessment & Plan Note (Signed)
Controlled, no change in medication  

## 2023-01-13 NOTE — Assessment & Plan Note (Signed)
Needs to start CPAP, will reach out for  help at checkout

## 2023-01-13 NOTE — Progress Notes (Signed)
   Tamara Lam     MRN: [id number]      DOB: [birth year]  Chief Complaint  Patient presents with   Follow-up    Follow up legs aching off and on     HPI Tamara Lam is here for follow up and re-evaluation of chronic medical conditions, medication management and review of any available recent lab and radiology data.  Preventive health is updated, specifically  Cancer screening and Immunization.   Questions or concerns regarding consultations or procedures which the PT has had in the interim are  addressed.Has been evaluated by Neurology , no significant lumbar pathology to explain LE symptoms and since that time they have worsened. Statin dose was reduced to see if this would be beneficial 2 month h/o intermittent anterior thigh pain , not associated with activity, recently 2 weeks ago lasted for 2 days pain was sossevere she was unsure if she could take a step, right worse than left , onn avg one episode per week, duration half day  ROS Denies recent fever or chills. Denies sinus pressure, nasal congestion, ear pain or sore throat. Denies chest congestion, productive cough or wheezing. Denies chest pains, palpitations and leg swelling Denies abdominal pain, nausea, vomiting,diarrhea or constipation.   Denies dysuria, frequency, hesitancy or incontinence. Denies uncontrolled depression, anxiety or insomnia. Denies skin break down or rash.   PE  BP 125/80 (BP Location: Right Arm, Patient Position: Sitting, Cuff Size: Normal)   Pulse 65   Ht 5\' 7"  (1.702 m)   Wt 129 lb 1.9 oz (58.6 kg)   SpO2 95%   BMI 20.22 kg/m   Patient alert and oriented and in no cardiopulmonary distress.  HEENT: No facial asymmetry, EOMI,     Neck supple .  Chest: Clear to auscultation bilaterally.  CVS: S1, S2 no murmurs, no S3.Regular rate.  ABD: Soft non tender.   Ext: No edema  MS: Adequate ROM spine, shoulders, hips and knees.  Skin: Intact, no ulcerations or rash noted.  Psych: Good eye  contact, normal affect. Memory intact not anxious or depressed appearing.  CNS: CN 2-12 intact, power,  normal throughout.no focal deficits noted.   Assessment & Plan  OSA (obstructive sleep apnea) Needs to start CPAP, will reach out for  help at checkout  MDD (major depressive disorder), recurrent, in partial remission (HCC) Controlled, no change in medication   Hyperlipidemia LDL goal <100 Hyperlipidemia:Low fat diet discussed and encouraged.   Lipid Panel  Lab Results  Component Value Date   CHOL 198 01/09/2023   HDL 59 01/09/2023   LDLCALC 120 (H) 01/09/2023   TRIG 106 01/09/2023   CHOLHDL 3.4 01/09/2023     Not at gosl and possible statininduced muscle pain, hold statin x 4 weeks and re eval  Myositis D/c statin , re eval in 4 weeks  Nontoxic multinodular goiter Managed by Endo

## 2023-01-13 NOTE — Assessment & Plan Note (Signed)
D/c statin , re eval in 4 weeks

## 2023-01-13 NOTE — Assessment & Plan Note (Signed)
Hyperlipidemia:Low fat diet discussed and encouraged.   Lipid Panel  Lab Results  Component Value Date   CHOL 198 01/09/2023   HDL 59 01/09/2023   LDLCALC 120 (H) 01/09/2023   TRIG 106 01/09/2023   CHOLHDL 3.4 01/09/2023     Not at gosl and possible statininduced muscle pain, hold statin x 4 weeks and re eval

## 2023-01-13 NOTE — Patient Instructions (Addendum)
F/U in 4 months, call if you need me sooner  Please STOP simvastatin for 4 weeks and see if thigh pain stops , if it does , do NOT resume the simvastatin, if there is no difference I will want  you to resume simvastatin, taking it from Monday to Friday, 5 days per week instead of 3  You need to call and leave a message / speak with me in 4 weeks so we are clear about the plan moving forward  Nurse please assist, pt was to start CPAP through pulmonary, missed call wants and needs to get that sorted out, let staff , pulmonary, at checkout know what's GOING ON AND HELP HER WITH THIS PLEASE  Fasting lipid and hepatic 3 to 5 days before next visit  Thanks for choosing [facility], we consider it a privelige to serve you.

## 2023-01-16 ENCOUNTER — Ambulatory Visit: Payer: Medicare Other | Admitting: Pulmonary Disease

## 2023-01-16 ENCOUNTER — Encounter: Payer: Self-pay | Admitting: Pulmonary Disease

## 2023-01-16 VITALS — BP 113/72 | HR 74 | Ht 67.0 in | Wt 128.0 lb

## 2023-01-16 DIAGNOSIS — G4733 Obstructive sleep apnea (adult) (pediatric): Secondary | ICD-10-CM | POA: Diagnosis not present

## 2023-01-16 DIAGNOSIS — R053 Chronic cough: Secondary | ICD-10-CM | POA: Diagnosis not present

## 2023-01-16 NOTE — Assessment & Plan Note (Signed)
We will initiate auto CPAP 5 to 12 cm with nasal cradle mask.  After reviewing download we will adjust settings as needed compliance with goal of at least 4-6 hrs every night is the expectation. Advised against medications with sedative side effects Cautioned against driving when sleepy - understanding that sleepiness will vary on a day to day basis

## 2023-01-16 NOTE — Patient Instructions (Signed)
X Trial of autoCPAP 5-12 cm with nasal cradle mask to DME

## 2023-01-16 NOTE — Assessment & Plan Note (Signed)
Appears resolved with treatment for reflux

## 2023-01-16 NOTE — Progress Notes (Signed)
   Subjective:    Patient ID: Tamara Lam, female    DOB: 02-Apr-1951, 72 y.o.   MRN: [id number]  HPI  72 yo never smoker for FU of hypersomnolence and chronic cough  Triggers include postnasal drip and GERD.   78-month follow-up visit. Her cough is improved.  She never tried [employer].  She did try Pepcid for reflux symptoms well-controlled. She continues to complain of tiredness in the daytime.  She never got started on CPAP therapy not sure whether the order was not placed correctly or DME never followed through or was unable to contact her.  She does not know either.  She reports sleepiness and nonrefreshing sleep   Significant tests/ events reviewed   02/2022 HST - mild OSA with AHI 11/ hr, low sat 83% TST was 10.5 h   Review of Systems neg for any significant sore throat, dysphagia, itching, sneezing, nasal congestion or excess/ purulent secretions, fever, chills, sweats, unintended wt loss, pleuritic or exertional cp, hempoptysis, orthopnea pnd or change in chronic leg swelling. Also denies presyncope, palpitations, heartburn, abdominal pain, nausea, vomiting, diarrhea or change in bowel or urinary habits, dysuria,hematuria, rash, arthralgias, visual complaints, headache, numbness weakness or ataxia.      Objective:   Physical Exam  Gen. Pleasant, well-nourished, in no distress ENT - no thrush, no pallor/icterus,no post nasal drip, class 2 airway Neck: No JVD, no thyromegaly, no carotid bruits Lungs: no use of accessory muscles, no dullness to percussion, clear without rales or rhonchi  Cardiovascular: Rhythm regular, heart sounds  normal, no murmurs or gallops, no peripheral edema Musculoskeletal: No deformities, no cyanosis or clubbing        Assessment & Plan:

## 2023-01-30 DIAGNOSIS — G4733 Obstructive sleep apnea (adult) (pediatric): Secondary | ICD-10-CM | POA: Diagnosis not present

## 2023-02-02 DIAGNOSIS — L309 Dermatitis, unspecified: Secondary | ICD-10-CM | POA: Diagnosis not present

## 2023-02-25 ENCOUNTER — Telehealth: Payer: Self-pay | Admitting: Family Medicine

## 2023-02-25 NOTE — Telephone Encounter (Signed)
Patient called in regard to thigh pain. Pt stopped the simvastatin (ZOCOR) 10 MG tablet [[id number]]  As suggested by provider, pt is still experiencing pains. Will start back taking med as instructed.  Wants a call back in regard.

## 2023-03-05 NOTE — Telephone Encounter (Signed)
Patient aware.

## 2023-03-17 ENCOUNTER — Other Ambulatory Visit: Payer: Self-pay | Admitting: Family Medicine

## 2023-03-18 DIAGNOSIS — Z961 Presence of intraocular lens: Secondary | ICD-10-CM | POA: Diagnosis not present

## 2023-03-18 DIAGNOSIS — H401131 Primary open-angle glaucoma, bilateral, mild stage: Secondary | ICD-10-CM | POA: Diagnosis not present

## 2023-03-18 DIAGNOSIS — R7309 Other abnormal glucose: Secondary | ICD-10-CM | POA: Diagnosis not present

## 2023-03-24 ENCOUNTER — Ambulatory Visit
Admission: RE | Admit: 2023-03-24 | Discharge: 2023-03-24 | Disposition: A | Discharge status: Home / Self Care | Payer: Medicare Other | Source: Ambulatory Visit | Attending: Family Medicine | Admitting: Family Medicine

## 2023-03-24 DIAGNOSIS — Z1231 Encounter for screening mammogram for malignant neoplasm of breast: Secondary | ICD-10-CM

## 2023-03-31 ENCOUNTER — Ambulatory Visit (INDEPENDENT_AMBULATORY_CARE_PROVIDER_SITE_OTHER): Payer: Medicare Other

## 2023-03-31 DIAGNOSIS — Z23 Encounter for immunization: Secondary | ICD-10-CM

## 2023-04-03 ENCOUNTER — Ambulatory Visit (INDEPENDENT_AMBULATORY_CARE_PROVIDER_SITE_OTHER): Payer: Medicare Other | Admitting: Gastroenterology

## 2023-04-03 ENCOUNTER — Encounter: Payer: Self-pay | Admitting: Gastroenterology

## 2023-04-03 VITALS — BP 147/82 | HR 66 | Temp 99.1°F | Ht 67.0 in | Wt 131.8 lb

## 2023-04-03 DIAGNOSIS — B829 Intestinal parasitism, unspecified: Secondary | ICD-10-CM | POA: Diagnosis not present

## 2023-04-03 DIAGNOSIS — R1310 Dysphagia, unspecified: Secondary | ICD-10-CM | POA: Diagnosis not present

## 2023-04-03 DIAGNOSIS — K5909 Other constipation: Secondary | ICD-10-CM | POA: Diagnosis not present

## 2023-04-03 DIAGNOSIS — R140 Abdominal distension (gaseous): Secondary | ICD-10-CM | POA: Diagnosis not present

## 2023-04-03 DIAGNOSIS — K219 Gastro-esophageal reflux disease without esophagitis: Secondary | ICD-10-CM | POA: Diagnosis not present

## 2023-04-03 DIAGNOSIS — L290 Pruritus ani: Secondary | ICD-10-CM

## 2023-04-03 MED ORDER — PANTOPRAZOLE SODIUM 40 MG PO TBEC: 40.0000 mg | DELAYED_RELEASE_TABLET | Freq: Every day | ORAL | 3 refills | Status: DC

## 2023-04-03 NOTE — Patient Instructions (Addendum)
You cannot use the specimen you currently have. You will need to get a special container from [facility] and if you see something concerning for a worm, then collect into that container. This test is a parasite identification test.  We are also planning a pinworm test, you collect first thing in the morning (using tape that you apply at anus to collect potential pinworms) and take back to the lab. The lab should provide instructions. You will also complete three ova and parasite examinations of your stool. You should do this on three separate days.  Start pantoprazole 40mg  daily before breakfast. Call in 4 weeks if your issues swallowing pills is not better. At that time would consider xray of your esophagus versus upper endoscopy.  You can continue famotidine (Pepcid) twice daily as needed even after starting pantoprazole. Continue Linzess as before.

## 2023-04-03 NOTE — Progress Notes (Signed)
GI Office Note    Referring Provider: Kerri Perches, MD Primary Care Physician:  Kerri Perches, MD  Primary Gastroenterologist: Roetta Sessions, MD   Chief Complaint   Chief Complaint  Patient presents with   Follow-up    Thinks she seen a parasite in her stool, also has itching near her rectum at night. Has also been having trouble with pills feeling like they are getting hung up.     History of Present Illness   Tamara Lam is a 72 y.o. female presenting today for anal itching. She has history of GERD, constipation. Last seen in 08/2022.   After last ov, we asked her to limit high FODMAP foods, try IBgard and probiotics. Started her on docusate sodium 200mg  every nith. Use glycerin supp or Linzess if no BM in 48 hours. CT A/P overall unremarkable 09/2022. States nothing really helped her bloating.   Today, continues to have ongoing symptoms. Anal itching persists, off/on for several years. Seems to be getting worse. Continues to have a lot of bloating/gas. She is worried she may have pinworm and parasites. She has googled symptoms and notes she has several including the bloating, insomnia, fatigue. She is hungry all the time. She brought in something she thinks good be a tiny worm. About 1-2 cm in length, 2 mm in diameter, difficult to see as toilet tissue wrapped with it. She continues to have fluctuation of stools from constipation/diarrhea. She has noted that pills seem to be sticking in her chest, can happen with small pills as well. No dysphagia to foods/liquids. She is having heartburn several times per week, takes Pepcid 3-4 times per week. No melena, brbpr.      Wt Readings from Last 3 Encounters:  04/03/23 131 lb 12.8 oz (59.8 kg)  01/16/23 128 lb (58.1 kg)  01/13/23 129 lb 1.9 oz (58.6 kg)     Colonoscopy 06/2021: -non-bleeding internal hemorrhoids -Diverticulosis in the entire examined colon. -The examination was otherwise normal on direct and  retroflexion views. - No specimens collected. -next colonoscopy in five years.   EGD 02/2018: -scattered antral erosions/erythema -bx with mild inflammation, no h.pylori   Medications   Current Outpatient Medications  Medication Sig Dispense Refill   Calcium-Magnesium-Vitamin D (CALCIUM 1200+D3 PO) Take 1 tablet by mouth daily.     escitalopram (LEXAPRO) 10 MG tablet Take 1 tablet (10 mg total) by mouth at bedtime. 90 tablet 3   famotidine (PEPCID) 20 MG tablet Take 20 mg by mouth daily as needed for heartburn.     latanoprost (XALATAN) 0.005 % ophthalmic solution SMARTSIG:In Eye(s)     linaclotide (LINZESS) 290 MCG CAPS capsule Take 1 capsule (290 mcg total) by mouth daily before breakfast. TAKE 1 CAPSULE (290 mcg total) BY MOUTH  DAILY BEFORE BREAKFAST 90 capsule 3   Multiple Vitamins-Minerals (CENTRUM ADULTS) TABS Take 1 tablet by mouth daily.     simvastatin (ZOCOR) 20 MG tablet TAKE 1 TABLET BY MOUTH AT  BEDTIME FOR CHOLESTEROL 100 tablet 2   triamcinolone (KENALOG) 0.025 % cream Apply 1 Application topically daily.     vitamin C (ASCORBIC ACID) 500 MG tablet Take 500 mg by mouth daily.     No current facility-administered medications for this visit.    Allergies   Allergies as of 04/03/2023   (No Known Allergies)      Review of Systems   General: Negative for anorexia, weight loss, fever, chills,  weakness. See hpi ENT: Negative for  hoarseness, difficulty swallowing , nasal congestion. CV: Negative for chest pain, angina, palpitations, dyspnea on exertion, peripheral edema.  Respiratory: Negative for dyspnea at rest, dyspnea on exertion, cough, sputum, wheezing.  GI: See history of present illness. GU:  Negative for dysuria, hematuria, urinary incontinence, urinary frequency, nocturnal urination.  Endo: Negative for unusual weight change.     Physical Exam   BP (!) 147/82 (BP Location: Right Arm, Patient Position: Sitting, Cuff Size: Normal)   Pulse 66   Temp 99.1  F (37.3 C) (Oral)   Ht 5\' 7"  (1.702 m)   Wt 131 lb 12.8 oz (59.8 kg)   SpO2 96%   BMI 20.64 kg/m    General: Well-nourished, well-developed in no acute distress.  Eyes: No icterus. Mouth: Oropharyngeal mucosa moist and pink , no lesions erythema or exudate. Abdomen: Bowel sounds are normal, nontender, nondistended, no hepatosplenomegaly or masses,  no abdominal bruits or hernia , no rebound or guarding.  Rectal: externally, appears to have tiny remnants of toilet tissue. No external lesions. With DRE, she has palpable hemorrhoid posteriorly, slightly tenderness. No masses. No stool present. On gloved finger, noted several tiny 1mm white "pieces" (?stool debris/tissue)    Extremities: No lower extremity edema. No clubbing or deformities. Neuro: Alert and oriented x 4   Skin: Warm and dry, no jaundice.   Psych: Alert and cooperative, normal mood and affect.  Labs   Lab Results  Component Value Date   NA 142 01/09/2023   CL 102 01/09/2023   K 4.0 01/09/2023   CO2 26 01/09/2023   BUN 15 01/09/2023   CREATININE 0.66 01/09/2023   EGFR 93 01/09/2023   CALCIUM 9.6 01/09/2023   PHOS 2.5 08/17/2013   ALBUMIN 4.3 01/09/2023   GLUCOSE 90 01/09/2023   Lab Results  Component Value Date   WBC 5.6 01/09/2023   HGB 12.9 01/09/2023   HCT 40.1 01/09/2023   MCV 88 01/09/2023   PLT 186 01/09/2023   Lab Results  Component Value Date   VITAMINB12 633 12/29/2022   Lab Results  Component Value Date   ALT 15 01/09/2023   AST 17 01/09/2023   ALKPHOS 55 01/09/2023   BILITOT 0.3 01/09/2023    Imaging Studies   MM 3D SCREENING MAMMOGRAM BILATERAL BREAST  Result Date: 03/25/2023 CLINICAL DATA:  Screening. EXAM: DIGITAL SCREENING BILATERAL MAMMOGRAM WITH TOMOSYNTHESIS AND CAD TECHNIQUE: Bilateral screening digital craniocaudal and mediolateral oblique mammograms were obtained. Bilateral screening digital breast tomosynthesis was performed. The images were evaluated with computer-aided  detection. COMPARISON:  Previous exam(s). ACR Breast Density Category c: The breasts are heterogeneously dense, which may obscure small masses. FINDINGS: There are no findings suspicious for malignancy. IMPRESSION: No mammographic evidence of malignancy. A result letter of this screening mammogram will be mailed directly to the patient. RECOMMENDATION: Screening mammogram in one year. (Code:SM-B-01Y) BI-RADS CATEGORY  1: Negative. Electronically Signed   By: Harmon Pier M.D.   On: 03/25/2023 16:46    Assessment   *GERD *Pill dysphagia *Bloating *Constipation *Anal itching *Concern for parasites  New onset pill dysphagia. May be due to reflux esophagitis. She takes Pepcid prn only but has to use several days a week. Start PPI daily for 4 weeks. If no better, then needs further evaluation.  Patient concerned for parasites. This has been recurring concern for her therefore we will proceed with testing.   The patient was found to have elevated blood pressure when vital signs were checked in the office. The blood pressure  was rechecked by the nursing staff and it was found be persistently elevated >140/90 mmHg. I personally advised to the patient to follow up closely with his PCP for hypertension control.   PLAN   Pinworm test. O+P X 3. Parasite identification exam if she sees something concerning for worm again.  Pantoprazole 40mg  daily before breakfast. PR in 4 weeks, if ongoing pill dysphagia, consider BPE vs EGD.  Continue Linzess daily as needed. Continue Pepcid 20mg  bid prn.   Leanna Battles. Melvyn Neth, MHS, PA-C [facility]

## 2023-04-08 DIAGNOSIS — K5909 Other constipation: Secondary | ICD-10-CM | POA: Diagnosis not present

## 2023-04-08 DIAGNOSIS — R140 Abdominal distension (gaseous): Secondary | ICD-10-CM | POA: Diagnosis not present

## 2023-04-08 DIAGNOSIS — B829 Intestinal parasitism, unspecified: Secondary | ICD-10-CM | POA: Diagnosis not present

## 2023-04-08 DIAGNOSIS — K219 Gastro-esophageal reflux disease without esophagitis: Secondary | ICD-10-CM | POA: Diagnosis not present

## 2023-04-09 LAB — PARASITE ID, WORM

## 2023-04-10 LAB — OVA AND PARASITE EXAMINATION

## 2023-05-14 DIAGNOSIS — E785 Hyperlipidemia, unspecified: Secondary | ICD-10-CM | POA: Diagnosis not present

## 2023-05-15 LAB — HEPATIC FUNCTION PANEL
ALT: 17 [IU]/L (ref 0–32)
AST: 26 [IU]/L (ref 0–40)
Albumin: 4.2 g/dL (ref 3.8–4.8)
Alkaline Phosphatase: 60 [IU]/L (ref 44–121)
Bilirubin Total: 0.4 mg/dL (ref 0.0–1.2)
Bilirubin, Direct: 0.15 mg/dL (ref 0.00–0.40)
Total Protein: 7 g/dL (ref 6.0–8.5)

## 2023-05-15 LAB — LIPID PANEL
Chol/HDL Ratio: 2.6 {ratio} (ref 0.0–4.4)
Cholesterol, Total: 172 mg/dL (ref 100–199)
HDL: 65 mg/dL (ref >39)
LDL Chol Calc (NIH): 90 mg/dL (ref 0–99)
Triglycerides: 94 mg/dL (ref 0–149)
VLDL Cholesterol Cal: 17 mg/dL (ref 5–40)

## 2023-05-18 ENCOUNTER — Ambulatory Visit: Payer: Medicare Other | Admitting: Neurology

## 2023-05-19 ENCOUNTER — Ambulatory Visit: Payer: Medicare Other | Admitting: Family Medicine

## 2023-06-02 ENCOUNTER — Telehealth: Payer: Self-pay | Admitting: Gastroenterology

## 2023-06-02 NOTE — Telephone Encounter (Signed)
Patient came into the office  to say that has been constipated since before Thanksgiving.  She said that she can still eat but cannot "eliminate anything".  She said the linzess is not helping.  She has taken some stool softener and it isn't working either.  Please advise.

## 2023-06-03 NOTE — Telephone Encounter (Signed)
Let's have her do a mini bowel prep.   Bisacodyl 10mg  X1.  Two hours later, start miralax. Take one capful with 8 ounces of water, followed by 8 ounces 30 minutes later. Repeat hourly for total 4-5 doses of miralax.  Example: Bisacodyl 10mg  at 10am Miralax one capful at 12 noon Water (8 ounces) at 12:30 Miralax at 1pm Water at 1:30pm Miralax at [employer] at 2:30 Miralax at 3 Water at [employer] at [employer] at 4:30  Following day, resume Linzess.

## 2023-06-03 NOTE — Telephone Encounter (Signed)
Spoke to pt, she informed me that she feels like she is not emptying her bowels. She states she takes her Linzess daily. No nausea or vomiting.  She did have a small bowel movement today, but it was little balls. She states she thinks she needs a good cleaning out.

## 2023-06-03 NOTE — Telephone Encounter (Signed)
Can we please find out more information to clarify the being unable to eliminate anything? When was her last BM? Is she having any nausea/vomiting, abdominal pain?

## 2023-06-08 NOTE — Telephone Encounter (Signed)
Spoke to pt, informed her of recommendations. Pt voiced understanding. Informed to call in a couple of days with an update.

## 2023-06-11 ENCOUNTER — Ambulatory Visit (HOSPITAL_BASED_OUTPATIENT_CLINIC_OR_DEPARTMENT_OTHER): Payer: Medicare Other | Admitting: Pulmonary Disease

## 2023-06-11 ENCOUNTER — Encounter (HOSPITAL_BASED_OUTPATIENT_CLINIC_OR_DEPARTMENT_OTHER): Payer: Self-pay | Admitting: Pulmonary Disease

## 2023-06-11 VITALS — BP 124/72 | HR 71 | Resp 16 | Ht 67.0 in | Wt 130.9 lb

## 2023-06-11 DIAGNOSIS — G4733 Obstructive sleep apnea (adult) (pediatric): Secondary | ICD-10-CM | POA: Diagnosis not present

## 2023-06-11 DIAGNOSIS — R053 Chronic cough: Secondary | ICD-10-CM | POA: Diagnosis not present

## 2023-06-11 NOTE — Assessment & Plan Note (Signed)
x

## 2023-06-11 NOTE — Progress Notes (Signed)
   Subjective:    Patient ID: Tamara Lam, female    DOB: 31-Mar-1951, 72 y.o.   MRN: [id number]  HPI  72 yo never smoker for FU of hypersomnolence and chronic cough  Triggers include postnasal drip and GERD.  Discussed the use of AI scribe software for clinical note transcription with the patient, who gave verbal consent to proceed.  History of Present Illness   Tamara Lam, a patient with sleep apnea, has been using an auto CPAP machine since her last visit on 01/21/2023. Initially, she found it difficult to adjust to the machine, particularly the face mask. However, she reports that she has now become accustomed to it and is experiencing no issues. Despite still feeling sleepy in the mornings, which she attributes to late bedtimes, she reports less overall tiredness. She uses the machine for approximately six hours or more when she uses it, although there have been a few nights when she did not use it. She also reports an improvement in a previously mentioned cough.      CPAP download was reviewed which shows excellent control of her events hide average pressure of 12 cm on auto settings 5 to 12 cm.  She is very compliant.  CPAP has certainly helped improve her daytime somnolence and fatigue  Significant tests/ events reviewed   02/2022 HST - mild OSA with AHI 11/ hr, low sat 83% TST was 10.5 h  Review of Systems neg for any significant sore throat, dysphagia, itching, sneezing, nasal congestion or excess/ purulent secretions, fever, chills, sweats, unintended wt loss, pleuritic or exertional cp, hempoptysis, orthopnea pnd or change in chronic leg swelling. Also denies presyncope, palpitations, heartburn, abdominal pain, nausea, vomiting, diarrhea or change in bowel or urinary habits, dysuria,hematuria, rash, arthralgias, visual complaints, headache, numbness weakness or ataxia.     Objective:   Physical Exam  Gen. Pleasant, well-nourished, in no distress ENT - no thrush, no  pallor/icterus,no post nasal drip Neck: No JVD, no thyromegaly, no carotid bruits Lungs: no use of accessory muscles, no dullness to percussion, clear without rales or rhonchi  Cardiovascular: Rhythm regular, heart sounds  normal, no murmurs or gallops, no peripheral edema Musculoskeletal: No deformities, no cyanosis or clubbing        Assessment & Plan:   Assessment and Plan    Obstructive Sleep Apnea Initially had difficulty adjusting to auto CPAP but now acclimated well. Using a nasal mask with good compliance, averaging over six hours per night. Feels less tired but still experiences morning sleepiness, likely due to insufficient sleep duration. CPAP settings are effective, reducing apnea events from 13 to 3 per hour. No significant mask leaks noted. Discussed regular use and maintenance of CPAP, including filter changes every three months. Machine settings are optimized; future follow-ups can be managed by assistants if routine. - Continue current auto CPAP settings (5-12 cm H2O) - Ensure regular use of CPAP machine - Change filters every three months - Schedule follow-up in six months  Chronic Cough Improvement noted, though cough persists intermittently. Pepcid has helped with reflux, which may be contributing to the cough. Discussed monitoring symptoms and considering further evaluation if symptoms persist or worsen. - Continue monitoring cough symptoms - Consider further evaluation if symptoms persist or worsen  General Health Maintenance Educated on the importance of regular CPAP maintenance and filter changes. - Educate on CPAP maintenance and filter changes every three months  Follow-up - Schedule follow-up appointment in six months.

## 2023-06-11 NOTE — Patient Instructions (Signed)
CPAP is working well. Continue using every night

## 2023-06-22 ENCOUNTER — Other Ambulatory Visit: Payer: Self-pay | Admitting: Family Medicine

## 2023-06-25 ENCOUNTER — Ambulatory Visit (INDEPENDENT_AMBULATORY_CARE_PROVIDER_SITE_OTHER): Payer: Medicare Other | Admitting: Family Medicine

## 2023-06-25 ENCOUNTER — Encounter: Payer: Self-pay | Admitting: Family Medicine

## 2023-06-25 VITALS — BP 128/83 | HR 78 | Ht 67.0 in | Wt 132.1 lb

## 2023-06-25 DIAGNOSIS — F419 Anxiety disorder, unspecified: Secondary | ICD-10-CM

## 2023-06-25 DIAGNOSIS — R194 Change in bowel habit: Secondary | ICD-10-CM

## 2023-06-25 DIAGNOSIS — E042 Nontoxic multinodular goiter: Secondary | ICD-10-CM | POA: Diagnosis not present

## 2023-06-25 DIAGNOSIS — E785 Hyperlipidemia, unspecified: Secondary | ICD-10-CM

## 2023-06-25 DIAGNOSIS — Z83719 Family history of colon polyps, unspecified: Secondary | ICD-10-CM

## 2023-06-25 DIAGNOSIS — M79651 Pain in right thigh: Secondary | ICD-10-CM

## 2023-06-25 NOTE — Patient Instructions (Addendum)
Annual exam in March/ April, call if you need me sooner  You are referred back to Neurology to continue evaluation of right thigh pain and weakness, ongoing for past  1 year  You a re being referred back to  gI re concern with change in stool  Cholesterol and liver are excellent    Thanks for choosing [facility], we consider it a privelige to serve you.

## 2023-06-29 ENCOUNTER — Telehealth: Payer: Self-pay

## 2023-06-29 ENCOUNTER — Other Ambulatory Visit (INDEPENDENT_AMBULATORY_CARE_PROVIDER_SITE_OTHER): Payer: Self-pay | Admitting: Gastroenterology

## 2023-06-29 ENCOUNTER — Encounter: Payer: Self-pay | Admitting: Family Medicine

## 2023-06-29 DIAGNOSIS — K5909 Other constipation: Secondary | ICD-10-CM

## 2023-06-29 DIAGNOSIS — R194 Change in bowel habit: Secondary | ICD-10-CM | POA: Insufficient documentation

## 2023-06-29 DIAGNOSIS — M79651 Pain in right thigh: Secondary | ICD-10-CM | POA: Insufficient documentation

## 2023-06-29 MED ORDER — LINACLOTIDE 290 MCG PO CAPS: 290.0000 ug | ORAL_CAPSULE | Freq: Every day | ORAL | 0 refills | Status: DC

## 2023-06-29 NOTE — Assessment & Plan Note (Signed)
Hyperlipidemia:Low fat diet discussed and encouraged.   Lipid Panel  Lab Results  Component Value Date   CHOL 172 05/14/2023   HDL 65 05/14/2023   LDLCALC 90 05/14/2023   TRIG 94 05/14/2023   CHOLHDL 2.6 05/14/2023     Controlled, no change in medication

## 2023-06-29 NOTE — Assessment & Plan Note (Addendum)
Persistent c/o right thigh pain and weakness, pt needs to continue to follow up with Neurology and is referred back, states she will go , had cancelled her f/u previously

## 2023-06-29 NOTE — Assessment & Plan Note (Signed)
Increased anxiety a s helps a lot with the care of her Tamara Lam, feels overwhelmed at times, as if not getting sufficient rest, though happy she is able to help  No med change

## 2023-06-29 NOTE — Telephone Encounter (Signed)
Pt is requesting refills on her linzess to [facility]. Pt seen Verlon Au on 04/03/2023, routing to you in her absence.

## 2023-06-29 NOTE — Assessment & Plan Note (Signed)
Recent colonoscopy in 06/2021, no polyps noted

## 2023-06-29 NOTE — Progress Notes (Signed)
   Tamara Lam     MRN: [id number]      DOB: June 14, 1951  Chief Complaint  Patient presents with   Follow-up    Follow up    HPI Tamara Lam is here for follow up and re-evaluation of chronic medical conditions, medication management and review of any available recent lab and radiology data.  Preventive health is updated, specifically  Cancer screening and Immunization.   Questions or concerns regarding consultations or procedures which the PT has had in the interim are  addressed. Has been evaluated  recently by both GI and neurology , and returns with the same concerns/ complaints C/o burning right thigh pain and weakness persisitent  States also that she is  concerned that GI did not address the change in stool caliber/ consistency that she reported and this concerns her , thinks she needs a colonoscopy, most recent was 06/2021   The PT denies any adverse reactions to current medications since the last visit.  There are no new concerns.  There are no specific complaints   ROS Denies recent fever or chills. Denies sinus pressure, nasal congestion, ear pain or sore throat. Denies chest congestion, productive cough or wheezing. Denies chest pains, palpitations and leg swelling Denies abdominal pain, nausea, vomiting,diarrhea or constipation.   Denies dysuria, frequency, hesitancy or incontinence. Denies depression, has increased  anxiety or insomnia. Denies skin break down or rash.   PE  BP 128/83 (BP Location: Right Arm, Patient Position: Sitting, Cuff Size: Normal)   Pulse 78   Ht 5\' 7"  (1.702 m)   Wt 132 lb 1.9 oz (59.9 kg)   SpO2 94%   BMI 20.69 kg/m   Patient alert and oriented and in no cardiopulmonary distress.  HEENT: No facial asymmetry, EOMI,     Neck supple .  Chest: Clear to auscultation bilaterally.  CVS: S1, S2 no murmurs, no S3.Regular rate.  ABD: Soft non tender.   Ext: No edema  MS: Adequate ROM spine, shoulders, hips and knees.  Skin: Intact,  no ulcerations or rash noted.  Psych: Good eye contact, normal affect. Memory intact mildly  anxious and  depressed appearing.  CNS: CN 2-12 intact, power,  normal throughout.no focal deficits noted.   Assessment & Plan  Right thigh pain Persistent c/o right thigh pain and weakness, pt needs to continue to follow up with Neurology and is referred back, states she will go , had cancelled her f/u previously  Change in stool habits Recent change in stool habits reported, concerned that this has not been addressed at recent GI visit, will refer back to GI  Family history of colonic polyps Recent colonoscopy in 06/2021, no polyps noted  Anxiety disorder Increased anxiety a s helps a lot with the care of her Lucila Maine, feels overwhelmed at times, as if not getting sufficient rest, though happy she is able to help  No med change  Hyperlipidemia LDL goal <100 Hyperlipidemia:Low fat diet discussed and encouraged.   Lipid Panel  Lab Results  Component Value Date   CHOL 172 05/14/2023   HDL 65 05/14/2023   LDLCALC 90 05/14/2023   TRIG 94 05/14/2023   CHOLHDL 2.6 05/14/2023     Controlled, no change in medication   Nontoxic multinodular goiter Followed by Endo and has upcoming appt, stable

## 2023-06-29 NOTE — Assessment & Plan Note (Signed)
Recent change in stool habits reported, concerned that this has not been addressed at recent GI visit, will refer back to GI

## 2023-06-29 NOTE — Assessment & Plan Note (Signed)
Followed by Endo and has upcoming appt, stable

## 2023-06-29 NOTE — Addendum Note (Signed)
Addended by: Syliva Overman E on: 06/29/2023 12:59 AM   Modules accepted: Orders

## 2023-07-12 NOTE — Progress Notes (Signed)
 GI Office Note    Referring Provider: Antonetta Rollene BRAVO, MD Primary Care Physician:  Antonetta Rollene BRAVO, MD  Primary Gastroenterologist: Ozell Hollingshead, MD   Chief Complaint   Chief Complaint  Patient presents with   change in bowels    States that she has issues with constipation and gas. Has been taking Linzess  for years.     History of Present Illness   Tamara Lam is a 73 y.o. female presenting today for follow up. Last seen in 03/2023. H/o GERD, constipation, bloating.   Due to patient's concern for parasites, since last ov she completed parasite ID, ova and parasite exam which were both negative.   Today: Did a mini colon purge about a month ago. Has been struggling wit constipation since before Thanksgiving. Long history of constipation, has been on Linzess  for years but states she does not feel like she is having good evacuation. Does not feel stool is complete. Passing some stool daily. No abdominal pain. No nocturnal stools. Some [place] 1. At other times stools loose. Now taking miralax  in addition to Linzess . No melena, brbpr. Complains of a lot of gas, flatulence. Pressure in epigastric region. No dysphagia. On pantoprazole  in am, has helped more so than pepcid  only. In the past she reports trying fodmap diet, probiotics, ibgard without relief of her bloating/gas.   CT A/P with contrast 09/2022 IMPRESSION: 1. No acute findings within the abdomen or pelvis. No evidence for bowel obstruction. 2. Bilateral nonobstructing renal calculi. 3. Sigmoid diverticulosis without signs of acute diverticulitis.  Colonoscopy 06/2021: -non-bleeding internal hemorrhoids -Diverticulosis in the entire examined colon. -The examination was otherwise normal on direct and retroflexion views. - No specimens collected. -next colonoscopy in five years.   EGD 02/2018: -scattered antral erosions/erythema -bx with mild inflammation, no h.pylori  Medications   Current Outpatient  Medications  Medication Sig Dispense Refill   Calcium-Magnesium -Vitamin D  (CALCIUM 1200+D3 PO) Take 1 tablet by mouth daily.     escitalopram  (LEXAPRO ) 10 MG tablet TAKE (1) TABLET BY MOUTH AT BEDTIME. 90 tablet 3   famotidine  (PEPCID ) 20 MG tablet Take 20 mg by mouth daily as needed for heartburn.     latanoprost  (XALATAN ) 0.005 % ophthalmic solution SMARTSIG:In Eye(s)     linaclotide  (LINZESS ) 290 MCG CAPS capsule Take 1 capsule (290 mcg total) by mouth daily before breakfast. TAKE 1 CAPSULE (290 mcg total) BY MOUTH  DAILY BEFORE BREAKFAST 90 capsule 0   Multiple Vitamins-Minerals (CENTRUM ADULTS) TABS Take 1 tablet by mouth daily.     pantoprazole  (PROTONIX ) 40 MG tablet Take 1 tablet (40 mg total) by mouth daily before breakfast. 90 tablet 3   simvastatin  (ZOCOR ) 20 MG tablet TAKE 1 TABLET BY MOUTH AT  BEDTIME FOR CHOLESTEROL 100 tablet 2   triamcinolone  (KENALOG ) 0.025 % cream Apply 1 Application topically daily.     vitamin C (ASCORBIC ACID) 500 MG tablet Take 500 mg by mouth daily.     No current facility-administered medications for this visit.    Allergies   Allergies as of 07/13/2023   (No Known Allergies)       Review of Systems   General: Negative for anorexia, weight loss, fever, chills, fatigue, weakness. ENT: Negative for hoarseness, difficulty swallowing , nasal congestion. CV: Negative for chest pain, angina, palpitations, dyspnea on exertion, peripheral edema.  Respiratory: Negative for dyspnea at rest, dyspnea on exertion, cough, sputum, wheezing.  GI: See history of present illness. GU:  Negative for dysuria,  hematuria, urinary incontinence, urinary frequency, nocturnal urination.  Endo: Negative for unusual weight change.     Physical Exam   BP 119/73 (BP Location: Right Arm, Patient Position: Sitting, Cuff Size: Normal)   Pulse 76   Temp 98 F (36.7 C) (Oral)   Ht 5' 7 (1.702 m)   Wt 132 lb 6.4 oz (60.1 kg)   SpO2 97%   BMI 20.74 kg/m    General:  Well-nourished, well-developed in no acute distress.  Eyes: No icterus. Mouth: Oropharyngeal mucosa moist and pink   Abdomen: Bowel sounds are normal, nontender, nondistended, no hepatosplenomegaly or masses,  no abdominal bruits or hernia , no rebound or guarding. Palpable gas in upper abdomen. Rectal: not performed  Extremities: No lower extremity edema. No clubbing or deformities. Neuro: Alert and oriented x 4   Skin: Warm and dry, no jaundice.   Psych: Alert and cooperative, normal mood and affect.  Labs   Lab Results  Component Value Date   ALT 17 05/14/2023   AST 26 05/14/2023   ALKPHOS 60 05/14/2023   BILITOT 0.4 05/14/2023    Imaging Studies   No results found.  Assessment/Plan    GERD/epigastric pressure: -typical reflux improved on PPI -add pepcid  20mg  daily for four weeks to see if symptoms improve, if not, she may need EGD  Constipation/bloating/gas: -incomplete stools -switch linzess  to trulance  3mg  daily -continue miralax  once daily  Progress report in 3-4 weeks.    [name]. Ezzard, MHS, PA-C [facility]

## 2023-07-13 ENCOUNTER — Ambulatory Visit: Payer: Medicare Other | Admitting: Gastroenterology

## 2023-07-13 ENCOUNTER — Encounter: Payer: Self-pay | Admitting: Gastroenterology

## 2023-07-13 VITALS — BP 119/73 | HR 76 | Temp 98.0°F | Ht 67.0 in | Wt 132.4 lb

## 2023-07-13 DIAGNOSIS — R140 Abdominal distension (gaseous): Secondary | ICD-10-CM | POA: Diagnosis not present

## 2023-07-13 DIAGNOSIS — K5909 Other constipation: Secondary | ICD-10-CM

## 2023-07-13 DIAGNOSIS — K219 Gastro-esophageal reflux disease without esophagitis: Secondary | ICD-10-CM

## 2023-07-13 DIAGNOSIS — K5900 Constipation, unspecified: Secondary | ICD-10-CM | POA: Diagnosis not present

## 2023-07-13 MED ORDER — TRULANCE 3 MG PO TABS: 3.0000 mg | ORAL_TABLET | Freq: Every day | ORAL | 5 refills | Status: AC

## 2023-07-13 NOTE — Patient Instructions (Signed)
 Stop linzess . Start Trulance  3mg  daily with or without food. RX sent to pharmacy.   Continue pantoprazole  40mg  daily before breakfast. Add back famotidine  20mg  at supper for the next 3-4 weeks.   Call with a progress report in 3-4 weeks and let me know how your bowels, gas/bloating, reflux are doing.

## 2023-07-29 DIAGNOSIS — H401131 Primary open-angle glaucoma, bilateral, mild stage: Secondary | ICD-10-CM | POA: Diagnosis not present

## 2023-07-29 DIAGNOSIS — R7309 Other abnormal glucose: Secondary | ICD-10-CM | POA: Diagnosis not present

## 2023-07-29 DIAGNOSIS — Z961 Presence of intraocular lens: Secondary | ICD-10-CM | POA: Diagnosis not present

## 2023-08-13 DIAGNOSIS — M25551 Pain in right hip: Secondary | ICD-10-CM | POA: Diagnosis not present

## 2023-08-13 DIAGNOSIS — M25561 Pain in right knee: Secondary | ICD-10-CM | POA: Diagnosis not present

## 2023-08-13 DIAGNOSIS — Z96651 Presence of right artificial knee joint: Secondary | ICD-10-CM | POA: Diagnosis not present

## 2023-08-13 DIAGNOSIS — M1611 Unilateral primary osteoarthritis, right hip: Secondary | ICD-10-CM | POA: Diagnosis not present

## 2023-08-30 ENCOUNTER — Other Ambulatory Visit (INDEPENDENT_AMBULATORY_CARE_PROVIDER_SITE_OTHER): Payer: Self-pay | Admitting: Gastroenterology

## 2023-08-30 DIAGNOSIS — K5909 Other constipation: Secondary | ICD-10-CM

## 2023-09-02 DIAGNOSIS — M1611 Unilateral primary osteoarthritis, right hip: Secondary | ICD-10-CM | POA: Diagnosis not present

## 2023-09-08 ENCOUNTER — Telehealth: Payer: Self-pay | Admitting: "Endocrinology

## 2023-09-08 DIAGNOSIS — R7303 Prediabetes: Secondary | ICD-10-CM

## 2023-09-08 DIAGNOSIS — E042 Nontoxic multinodular goiter: Secondary | ICD-10-CM

## 2023-09-08 DIAGNOSIS — E782 Mixed hyperlipidemia: Secondary | ICD-10-CM

## 2023-09-08 DIAGNOSIS — M8580 Other specified disorders of bone density and structure, unspecified site: Secondary | ICD-10-CM

## 2023-09-08 NOTE — Telephone Encounter (Signed)
Lab orders updated and sent to [place].

## 2023-09-08 NOTE — Telephone Encounter (Signed)
 Update labs.

## 2023-09-09 ENCOUNTER — Encounter: Payer: Medicare Other | Admitting: Family Medicine

## 2023-09-15 DIAGNOSIS — E782 Mixed hyperlipidemia: Secondary | ICD-10-CM | POA: Diagnosis not present

## 2023-09-15 DIAGNOSIS — E042 Nontoxic multinodular goiter: Secondary | ICD-10-CM | POA: Diagnosis not present

## 2023-09-16 LAB — T4, FREE: Free T4: 1.24 ng/dL (ref 0.82–1.77)

## 2023-09-16 LAB — LIPID PANEL
Chol/HDL Ratio: 2.7 ratio (ref 0.0–4.4)
Cholesterol, Total: 176 mg/dL (ref 100–199)
HDL: 66 mg/dL (ref >39)
LDL Chol Calc (NIH): 95 mg/dL (ref 0–99)
Triglycerides: 81 mg/dL (ref 0–149)
VLDL Cholesterol Cal: 15 mg/dL (ref 5–40)

## 2023-09-16 LAB — T3, FREE: T3, Free: 2.6 pg/mL (ref 2.0–4.4)

## 2023-09-16 LAB — TSH: TSH: 1.28 u[IU]/mL (ref 0.450–4.500)

## 2023-09-17 ENCOUNTER — Encounter: Payer: Self-pay | Admitting: "Endocrinology

## 2023-09-17 ENCOUNTER — Ambulatory Visit: Payer: Medicare Other | Admitting: "Endocrinology

## 2023-09-17 VITALS — BP 108/74 | HR 72 | Ht 67.0 in | Wt 131.4 lb

## 2023-09-17 DIAGNOSIS — E782 Mixed hyperlipidemia: Secondary | ICD-10-CM | POA: Diagnosis not present

## 2023-09-17 DIAGNOSIS — E042 Nontoxic multinodular goiter: Secondary | ICD-10-CM

## 2023-09-17 DIAGNOSIS — R7303 Prediabetes: Secondary | ICD-10-CM

## 2023-09-17 DIAGNOSIS — M8580 Other specified disorders of bone density and structure, unspecified site: Secondary | ICD-10-CM | POA: Diagnosis not present

## 2023-09-17 LAB — POCT GLYCOSYLATED HEMOGLOBIN (HGB A1C): HbA1c, POC (prediabetic range): 5.7 % (ref 5.7–6.4)

## 2023-09-17 NOTE — Progress Notes (Signed)
 09/17/2023  Endocrinology follow-up note  Subjective:    Patient ID: Tamara Lam, female    DOB: February 25, 1951, PCP Kerri Perches, MD   Past Medical History:  Diagnosis Date   Abnormal CXR (chest x-ray)    Evaluated with Pulmonary- Dr. Juanetta Gosling- "pt. was told probably due to thin body frame"-saw no issues-PFT test normal.   Anxiety 1980's    Arthritis    osteoarthritis -knees, hands   Chronic constipation    tx. Linzess   Chronic nausea    Glaucoma 2004   Dr. Harlon Flor, laser to left eye Sept 2011, bilateral eye drops for this   Helicobacter pylori gastritis 04/2004   last EGD    Hematochezia    Hx of colonoscopy 08/2005   Dr. Jena Gauss / hemorrhoids    Thyroid disease    thyroid goiter, nodules-no problems.   Weight loss    Past Surgical History:  Procedure Laterality Date   ABDOMINAL HYSTERECTOMY     arthoscopic surgery on right knee  2007   BIOPSY  03/03/2018   Procedure: BIOPSY;  Surgeon: Corbin Ade, MD;  Location: [facility];  Service: Endoscopy;;  gastric for h. pylori   BREAST BIOPSY Right 08/07/2021   CHOLECYSTECTOMY  1990's   COLONOSCOPY  2011   Dr. Jena Gauss: anal canal/internal hemorrhoids, dimintuive rectosigmoid polyp (polypoid rectal mucosa), few sigmoid diverticula    COLONOSCOPY N/A 10/30/2015   Dr. Jena Gauss: diverticulosis, hemorrhoids, surveillance in 2022    COLONOSCOPY WITH PROPOFOL N/A 07/08/2021   Procedure: COLONOSCOPY WITH PROPOFOL;  Surgeon: Corbin Ade, MD;  Location: [facility];  Service: Endoscopy;  Laterality: N/A;  10:00am   ESOPHAGOGASTRODUODENOSCOPY  2005   Dr. Jena Gauss: diffuse submucosal petechial hemorrhage of te gastric mucosa, some noncompliance of gastric cavity, failure to insufflate fully, multiple gastric biopsies. gastric bx, h.pylori   ESOPHAGOGASTRODUODENOSCOPY N/A 03/03/2018   Dr. Jena Gauss: scattered antral erosions/erythema no ulcer/mass. Bx: mild inflammation and No H.pylori   TOTAL KNEE ARTHROPLASTY Right 05/26/2016    Procedure: RIGHT TOTAL KNEE ARTHROPLASTY;  Surgeon: Ollen Gross, MD;  Location: [facility];  Service: Orthopedics;  Laterality: Right;   VESICOVAGINAL FISTULA CLOSURE W/ TAH  1989   Social History   Socioeconomic History   Marital status: Widowed    Spouse name: Not on file   Number of children: Not on file   Years of education: Not on file   Highest education level: Not on file  Occupational History   Occupation: homemaker     Employer: UNEMPLOYED  Tobacco Use   Smoking status: Never   Smokeless tobacco: Never   Tobacco comments:    Never smoked  Vaping Use   Vaping status: Never Used  Substance and Sexual Activity   Alcohol use: No    Alcohol/week: 0.0 standard drinks of alcohol   Drug use: No   Sexual activity: Not Currently  Other Topics Concern   Not on file  Social History Narrative   Right Handed   Lives in a one story home with her son.    Social Drivers of Corporate investment banker Strain: Low Risk  (01/12/2023)   Overall Financial Resource Strain (CARDIA)    Difficulty of Paying Living Expenses: Not hard at all  Food Insecurity: No Food Insecurity (01/12/2023)   Hunger Vital Sign    Worried About Running Out of Food in the Last Year: Never true    Ran Out of Food in the Last Year: Never true  Transportation Needs:  No Transportation Needs (01/12/2023)   PRAPARE - Administrator, Civil Service (Medical): No    Lack of Transportation (Non-Medical): No  Physical Activity: Sufficiently Active (01/12/2023)   Exercise Vital Sign    Days of Exercise per Week: 7 days    Minutes of Exercise per Session: 60 min  Stress: No Stress Concern Present (01/12/2023)   [employer] of Occupational Health - Occupational Stress Questionnaire    Feeling of Stress : Not at all  Social Connections: Socially Integrated (01/12/2023)   Social Connection and Isolation Panel [NHANES]    Frequency of Communication with Friends and Family: More than three times a week     Frequency of Social Gatherings with Friends and Family: More than three times a week    Attends Religious Services: More than 4 times per year    Active Member of [employer] or Organizations: Yes    Attends Banker Meetings: More than 4 times per year    Marital Status: Married   Outpatient Encounter Medications as of 09/17/2023  Medication Sig   Calcium-Magnesium-Vitamin D (CALCIUM 1200+D3 PO) Take 1 tablet by mouth daily.   escitalopram (LEXAPRO) 10 MG tablet TAKE (1) TABLET BY MOUTH AT BEDTIME.   famotidine (PEPCID) 20 MG tablet Take 20 mg by mouth daily as needed for heartburn.   latanoprost (XALATAN) 0.005 % ophthalmic solution SMARTSIG:In Eye(s)   Multiple Vitamins-Minerals (CENTRUM ADULTS) TABS Take 1 tablet by mouth daily.   pantoprazole (PROTONIX) 40 MG tablet Take 1 tablet (40 mg total) by mouth daily before breakfast.   Plecanatide (TRULANCE) 3 MG TABS Take 1 tablet (3 mg total) by mouth daily.   simvastatin (ZOCOR) 20 MG tablet TAKE 1 TABLET BY MOUTH AT  BEDTIME FOR CHOLESTEROL   triamcinolone (KENALOG) 0.025 % cream Apply 1 Application topically daily.   vitamin C (ASCORBIC ACID) 500 MG tablet Take 500 mg by mouth daily.   No facility-administered encounter medications on file as of 09/17/2023.   ALLERGIES: No Known Allergies VACCINATION STATUS: Immunization History  Administered Date(s) Administered   Fluad Quad(high Dose 65+) 03/28/2019, 03/27/2021, 03/19/2022   Fluad Trivalent(High Dose 65+) 03/31/2023   Influenza Whole 03/13/2010, 03/12/2011   Influenza,inj,Quad PF,6+ Mos 04/24/2013, 02/20/2014, 03/27/2015, 04/28/2016, 04/14/2017, 02/24/2018, 02/20/2020   Moderna Sars-Covid-2 Vaccination 08/04/2019, 09/02/2019, 05/14/2020, 01/31/2021   Pneumococcal Conjugate-13 06/20/2014   Pneumococcal Polysaccharide-23 05/12/2016   Td 08/16/2009   Tdap 12/19/2019   Zoster Recombinant(Shingrix) 04/03/2021, 08/10/2021   Zoster, Live 02/17/2011    HPI  -73 -year-old  female patient with medical history as above.  She is here to follow-up for euthyroid multinodular goiter, osteopenia, and well-controlled type 2 diabetes.    She is not on any medication for diabetes, her point-of-care A1c remains stable at 5.7%.     She is s/p FNA of thyroid nodule which was benign in 2013, previsit thyroid ultrasound unremarkable for any new findings.  Her thyroid function tests are within normal limits.  She has no new complaints today. Her last thyroid ultrasound was unremarkable or stable findings in 2021.    She is not on any antithyroid intervention nor thyroid hormone supplement. She denies heat/cold intolerance.  She denies any family hx of thyroid dysfunction nor thyroid cancer. she denies exposure to neck radiation. No dysphagia , nor SOB. she has steady weight.  Her most recent DEXA scan shows reversal of her osteopenia to normal bone density in the spine and stable osteopenia in the hips.   She has  no interval falls, fractures.  Review of Systems  Constitutional: + Minimally fluctuating body weight,- fatigue, no subjective hyperthermia/hypothermia    Objective:    BP 108/74   Pulse 72   Ht 5\' 7"  (1.702 m)   Wt 131 lb 6.4 oz (59.6 kg)   BMI 20.58 kg/m   Wt Readings from Last 3 Encounters:  09/17/23 131 lb 6.4 oz (59.6 kg)  07/13/23 132 lb 6.4 oz (60.1 kg)  06/25/23 132 lb 1.9 oz (59.9 kg)    Physical Exam Constitutional:  + Current BMI of 20.1, not in acute distress, stable state of mind.   Eyes: PERRLA, EOMI, no exophthalmos ENT: moist mucous membranes, + decrease in thyromegaly, no cervical lymphadenopathy  CMP ( most recent) CMP     Component Value Date/Time   NA 142 01/09/2023 0953   K 4.0 01/09/2023 0953   CL 102 01/09/2023 0953   CO2 26 01/09/2023 0953   GLUCOSE 90 01/09/2023 0953   GLUCOSE 82 07/25/2016 1451   BUN 15 01/09/2023 0953   CREATININE 0.66 01/09/2023 0953   CREATININE 0.68 07/25/2016 1451   CALCIUM 9.6 01/09/2023 0953    PROT 7.0 05/14/2023 1127   ALBUMIN 4.2 05/14/2023 1127   AST 26 05/14/2023 1127   ALT 17 05/14/2023 1127   ALKPHOS 60 05/14/2023 1127   BILITOT 0.4 05/14/2023 1127   GFRNONAA 79 07/26/2020 1600   GFRNONAA >89 05/05/2016 1012   GFRAA 91 07/26/2020 1600   GFRAA >89 05/05/2016 1012     Diabetic Labs (most recent): Lab Results  Component Value Date   HGBA1C 5.7 09/17/2023   HGBA1C 5.8 09/17/2022   HGBA1C 5.9 (H) 01/20/2022   MICROALBUR 0.4 11/13/2015   MICROALBUR 0.9 06/20/2014     Lipid Panel ( most recent) Lipid Panel     Component Value Date/Time   CHOL 176 09/15/2023 1049   TRIG 81 09/15/2023 1049   HDL 66 09/15/2023 1049   CHOLHDL 2.7 09/15/2023 1049   CHOLHDL 2.3 05/05/2016 1012   VLDL 13 05/05/2016 1012   LDLCALC 95 09/15/2023 1049      Thyroid ultrasound from 08/01/2016  IMPRESSION: 1. The previously biopsied dominant approximately 2.1 cm nodule within the right lobe of the thyroid is grossly unchanged compared to the 09/2011 examination. Correlation prior biopsy results is recommended. 2. None of the additional discretely measured thyroid nodules, the majority of which appear either spongiform or cystic in composition, meet imaging criteria to recommend percutaneous sampling or dedicated follow-up.    Repeat surveillance thyroid ultrasound on September 06, 2019 IMPRESSION: 1. Similar findings of multinodular goiter. No worrisome new or enlarging thyroid nodules. 2. Previously biopsied dominant nodule within mid aspect of the right lobe of the thyroid (labeled #1), is unchanged compared to the 2013 examination. Correlation previous biopsy results is advised. 3. None of the remaining thyroid nodules, many of which are grossly unchanged since (at least) the 04/2014 examination, meet imaging criteria to recommend percutaneous sampling or continued dedicated follow-up.   Her recent bone density from August 22, 2022 [facility] L1-L2 08/22/2022 71.8 Normal  -1.0 1.046 g/cm2 0.2% - [facility] L1-L2 07/14/2019 68.7 Normal -1.0 1.044 g/cm2 2.9% - [facility] L1-L2 08/01/2016 65.7 Osteopenia -1.2 1.015 g/cm2 -0.5% - [facility] L1-L2 08/10/2013 62.7 Osteopenia -1.2 1.020 g/cm2 2.6% -  DualFemur Total Right 08/22/2022 71.8 Osteopenia -1.5 0.822 g/cm2 0.7% - DualFemur Total Right 07/14/2019 68.7 Osteopenia -1.5 0.816 g/cm2 4.5% Yes DualFemur Total Right 08/01/2016 65.7 Osteopenia -1.8 0.781 g/cm2 -  1.5% - DualFemur Total Right 08/10/2013 62.7 Osteopenia -1.7 0.793 g/cm2    Assessment & Plan:   1. Nontoxic multinodular goiter -She has stable euthyroid multinodular goiter-ultrasound imaging studies between 2013-2021.  She is status post fine-needle aspiration of a nodule in the right lobe of her thyroid with benign outcomes in 2013.     Her previsit thyroid function tests are consistent with euthyroid state.  She will not require any intervention with thyroid hormone or antithyroid medications.     She will have repeat thyroid function test and office visit in 1 year.    2. Diabetes mellitus without complication (HCC) -Her point-of-care A1c is 5.7% improving from 6.1%.  She will not need medication intervention for this at this time.    3.  Osteopenia-patient is not on antiosteoporosis treatment.  Her bone density studies from February 2024, are reassuring for now.  She will have repeat bone density in February 2026.    4.  Hyperlipidemia: She is encouraged to stay on her atorvastatin 10 mg 3 days a week to control her severe dyslipidemia.  - I advised patient to maintain close follow up with Kerri Perches, MD for primary care needs.   I spent  22  minutes in the care of the patient today including review of labs from Thyroid Function, CMP, and other relevant labs ; imaging/biopsy records (current and previous including abstractions from other facilities); face-to-face time discussing  her lab results and symptoms, medications doses, her options  of short and long term treatment based on the latest standards of care / guidelines;   and documenting the encounter.  Sharyn Lull Kellis  participated in the discussions, expressed understanding, and voiced agreement with the above plans.  All questions were answered to her satisfaction. she is encouraged to contact clinic should she have any questions or concerns prior to her return visit.     Follow up plan: Return in about 1 year (around 09/16/2024), or Bone density in Feb. 2026, for F/U with Pre-visit Labs, DXA Scan B4 NV.  Marquis Lunch, MD Phone: [phone]  Fax: [phone]  -  This note was partially dictated with voice recognition software. Similar sounding words can be transcribed inadequately or may not  be corrected upon review.  09/17/2023, 5:07 PM

## 2023-10-02 DIAGNOSIS — M1611 Unilateral primary osteoarthritis, right hip: Secondary | ICD-10-CM | POA: Diagnosis not present

## 2023-11-06 ENCOUNTER — Ambulatory Visit (HOSPITAL_COMMUNITY)
Admission: RE | Admit: 2023-11-06 | Discharge: 2023-11-06 | Disposition: A | Discharge status: Home / Self Care | Source: Ambulatory Visit | Attending: Family Medicine | Admitting: Family Medicine

## 2023-11-06 ENCOUNTER — Ambulatory Visit (INDEPENDENT_AMBULATORY_CARE_PROVIDER_SITE_OTHER): Admitting: Family Medicine

## 2023-11-06 ENCOUNTER — Encounter: Payer: Self-pay | Admitting: Family Medicine

## 2023-11-06 VITALS — BP 140/90 | HR 68 | Resp 16 | Ht 67.0 in | Wt 131.0 lb

## 2023-11-06 DIAGNOSIS — R7989 Other specified abnormal findings of blood chemistry: Secondary | ICD-10-CM | POA: Insufficient documentation

## 2023-11-06 DIAGNOSIS — L2082 Flexural eczema: Secondary | ICD-10-CM | POA: Diagnosis not present

## 2023-11-06 DIAGNOSIS — M542 Cervicalgia: Secondary | ICD-10-CM

## 2023-11-06 DIAGNOSIS — Z0000 Encounter for general adult medical examination without abnormal findings: Secondary | ICD-10-CM | POA: Diagnosis not present

## 2023-11-06 DIAGNOSIS — I10 Essential (primary) hypertension: Secondary | ICD-10-CM | POA: Insufficient documentation

## 2023-11-06 DIAGNOSIS — Z01818 Encounter for other preprocedural examination: Secondary | ICD-10-CM | POA: Insufficient documentation

## 2023-11-06 DIAGNOSIS — F419 Anxiety disorder, unspecified: Secondary | ICD-10-CM

## 2023-11-06 DIAGNOSIS — Z0001 Encounter for general adult medical examination with abnormal findings: Secondary | ICD-10-CM | POA: Diagnosis not present

## 2023-11-06 MED ORDER — BUSPIRONE HCL 5 MG PO TABS: ORAL_TABLET | ORAL | 2 refills | Status: DC

## 2023-11-06 MED ORDER — AMLODIPINE BESYLATE 2.5 MG PO TABS: 2.5000 mg | ORAL_TABLET | Freq: Every day | ORAL | 2 refills | Status: DC

## 2023-11-06 MED ORDER — TRIAMCINOLONE ACETONIDE 0.025 % EX CREA: 1.0000 | TOPICAL_CREAM | Freq: Two times a day (BID) | CUTANEOUS | 1 refills | Status: DC

## 2023-11-06 NOTE — Patient Instructions (Addendum)
 F/U early July, call if you need me sooner, EKG and CCUA at visit  Labs today, CBC, cmp and EGFR, vit D  Pls get CXR at hospital today  New for blood pressure is amlodipine 2.5 mg one daily  New for anxiety is buspirone  5 mg , take half tab twice daily and one at bedtime  Continue all other meds as before   Recent blood work is excellent!  Thanks for choosing [facility], we consider it a privelige to serve you.

## 2023-11-06 NOTE — Assessment & Plan Note (Signed)
 Annual exam as documented. . Immunization and cancer screening needs are specifically addressed at this visit.

## 2023-11-06 NOTE — Progress Notes (Unsigned)
    Tamara Lam     MRN: [id number]      DOB: [birth year]  Chief Complaint  Patient presents with   Annual Exam    cpe   Neck Pain    Ongoing neck pain/stiffness off and on. Worse on left side. Finds some relief with alternating heat/ice.     HPI: Patient is in for annual physical exam. No other health concerns are expressed or addressed at the visit. Recent labs,  are reviewed. Immunization is reviewed , and  updated if needed.   PE: Pleasant  female, alert and oriented x 3, in no cardio-pulmonary distress. Afebrile. HEENT No facial trauma or asymetry. Sinuses non tender.  Extra occullar muscles intact.. External ears normal, . Neck: supple, no adenopathy,JVD or thyromegaly.No bruits.  Chest: Clear to ascultation bilaterally.No crackles or wheezes. Non tender to palpation  Breast: No asymetry,no masses or lumps. No tenderness. No nipple discharge or inversion. No axillary or supraclavicular adenopathy  Cardiovascular system; Heart sounds normal,  S1 and  S2 ,no S3.  No murmur, or thrill. Apical beat not displaced Peripheral pulses normal.  Abdomen: Soft, non tender, no organomegaly or masses. No bruits. Bowel sounds normal. No guarding, tenderness or rebound.   GU: External genitalia normal female genitalia , normal female distribution of hair. No lesions. Urethral meatus normal in size, no  Prolapse, no lesions visibly  Present. Bladder non tender. Vagina pink and moist , with no visible lesions , discharge present . Adequate pelvic support no  cystocele or rectocele noted Cervix pink and appears healthy, no lesions or ulcerations noted, no discharge noted from os Uterus normal size, no adnexal masses, no cervical motion or adnexal tenderness.   Musculoskeletal exam: Full ROM of spine, hips , shoulders and knees. No deformity ,swelling or crepitus noted. No muscle wasting or atrophy.   Neurologic: Cranial nerves 2 to 12 intact. Power, tone ,sensation  and reflexes normal throughout. No disturbance in gait. No tremor.  Skin: Intact, no ulceration, erythema , scaling or rash noted. Pigmentation normal throughout  Psych; Normal mood and affect. Judgement and concentration normal   Assessment & Plan:  No problem-specific Assessment & Plan notes found for this encounter.

## 2023-11-06 NOTE — Assessment & Plan Note (Signed)
 Updated lab needed at/ before next visit.

## 2023-11-07 LAB — CBC
Hematocrit: 41.7 % (ref 34.0–46.6)
Hemoglobin: 13.1 g/dL (ref 11.1–15.9)
MCH: 28.5 pg (ref 26.6–33.0)
MCHC: 31.4 g/dL — ABNORMAL LOW (ref 31.5–35.7)
MCV: 91 fL (ref 79–97)
Platelets: 203 10*3/uL (ref 150–450)
RBC: 4.59 x10E6/uL (ref 3.77–5.28)
RDW: 12.9 % (ref 11.7–15.4)
WBC: 5.9 10*3/uL (ref 3.4–10.8)

## 2023-11-07 LAB — CMP14+EGFR
ALT: 15 IU/L (ref 0–32)
AST: 22 IU/L (ref 0–40)
Albumin: 4.5 g/dL (ref 3.8–4.8)
Alkaline Phosphatase: 64 IU/L (ref 44–121)
BUN/Creatinine Ratio: 20 (ref 12–28)
BUN: 16 mg/dL (ref 8–27)
Bilirubin Total: 0.4 mg/dL (ref 0.0–1.2)
CO2: 26 mmol/L (ref 20–29)
Calcium: 9.9 mg/dL (ref 8.7–10.3)
Chloride: 101 mmol/L (ref 96–106)
Creatinine, Ser: 0.81 mg/dL (ref 0.57–1.00)
Globulin, Total: 2.3 g/dL (ref 1.5–4.5)
Glucose: 84 mg/dL (ref 70–99)
Potassium: 4.2 mmol/L (ref 3.5–5.2)
Sodium: 142 mmol/L (ref 134–144)
Total Protein: 6.8 g/dL (ref 6.0–8.5)
eGFR: 77 mL/min/{1.73_m2} (ref >59)

## 2023-11-07 LAB — VITAMIN D 25 HYDROXY (VIT D DEFICIENCY, FRACTURES): Vit D, 25-Hydroxy: 51.6 ng/mL (ref 30.0–100.0)

## 2023-11-08 ENCOUNTER — Encounter: Payer: Self-pay | Admitting: Family Medicine

## 2023-11-08 DIAGNOSIS — L309 Dermatitis, unspecified: Secondary | ICD-10-CM | POA: Insufficient documentation

## 2023-11-08 NOTE — Assessment & Plan Note (Signed)
 Curently ex[periencing arthritis flare and neck spasm, topical rubs and tylenol  and exercises recommended

## 2023-11-08 NOTE — Assessment & Plan Note (Signed)
 DASH diet and commitment to daily physical activity for a minimum of 30 minutes discussed and encouraged, as a part of hypertension management. The importance of attaining a healthy weight is also discussed.     11/06/2023    9:25 AM 11/06/2023    8:46 AM 09/17/2023    2:00 PM 07/13/2023    3:34 PM 06/25/2023    3:05 PM 06/11/2023    9:50 AM 04/03/2023    8:59 AM  BP/Weight  Systolic BP 140 144 108 119 128 124 147  Diastolic BP 90 88 74 73 83 72 82  Wt. (Lbs)  131.04 131.4 132.4 132.12 130.9   BMI  20.52 kg/m2 20.58 kg/m2 20.74 kg/m2 20.69 kg/m2 20.5 kg/m2      Start amlodipine 2.5 mg daily EKG: NSR, no ischemia, no LVH

## 2023-11-08 NOTE — Assessment & Plan Note (Signed)
 Intermittent as needed topical steroid for control

## 2023-11-08 NOTE — Assessment & Plan Note (Signed)
 Uncontrolled, add buspar , low dose

## 2023-11-09 ENCOUNTER — Encounter: Payer: Self-pay | Admitting: Family Medicine

## 2023-11-23 ENCOUNTER — Other Ambulatory Visit: Payer: Self-pay | Admitting: Family Medicine

## 2023-12-15 ENCOUNTER — Encounter: Payer: Self-pay | Admitting: Family Medicine

## 2023-12-15 ENCOUNTER — Ambulatory Visit (INDEPENDENT_AMBULATORY_CARE_PROVIDER_SITE_OTHER): Admitting: Family Medicine

## 2023-12-15 VITALS — BP 153/96 | HR 65 | Resp 16 | Ht 67.0 in | Wt 131.1 lb

## 2023-12-15 DIAGNOSIS — F3341 Major depressive disorder, recurrent, in partial remission: Secondary | ICD-10-CM | POA: Diagnosis not present

## 2023-12-15 DIAGNOSIS — I10 Essential (primary) hypertension: Secondary | ICD-10-CM | POA: Diagnosis not present

## 2023-12-15 DIAGNOSIS — G4733 Obstructive sleep apnea (adult) (pediatric): Secondary | ICD-10-CM | POA: Diagnosis not present

## 2023-12-15 DIAGNOSIS — Z1231 Encounter for screening mammogram for malignant neoplasm of breast: Secondary | ICD-10-CM | POA: Diagnosis not present

## 2023-12-15 MED ORDER — AMLODIPINE BESYLATE 2.5 MG PO TABS: 2.5000 mg | ORAL_TABLET | Freq: Every day | ORAL | 3 refills | Status: DC

## 2023-12-15 MED ORDER — AMLODIPINE BESYLATE 5 MG PO TABS
5.0000 mg | ORAL_TABLET | Freq: Every day | ORAL | 3 refills | Status: DC
Start: 2023-12-15 — End: 2024-05-02

## 2023-12-15 NOTE — Progress Notes (Signed)
   CHALET KERWIN     MRN: [id number]      DOB: 1951/04/20  Chief Complaint  Patient presents with   Medical Management of Chronic Issues    5 week follow up     HPI Ms. Spare is here for Medical clearance for right hip replacement scheduled for 01/2024, specifically as far as blood pressure is concerned, as she has had the other necessary evaluations Denies adverse s/e with amlodipine , checks BP at home and it is  in the 150/s at times, and in the 130/s at other times  ROS Denies recent fever or chills. Denies sinus pressure, nasal congestion, ear pain or sore throat. Denies chest congestion, productive cough or wheezing. Denies chest pains, palpitations and leg swelling Denies abdominal pain, nausea, vomiting,diarrhea or constipation.   Denies dysuria, frequency, hesitancy or incontinence.  Denies headaches, seizures, numbness, or tingling. Denies depression, anxiety or insomnia. Denies skin break down or rash.   PE  BP (!) 153/96   Pulse 65   Resp 16   Ht 5' 7 (1.702 m)   Wt 131 lb 1.3 oz (59.5 kg)   SpO2 96%   BMI 20.53 kg/m   Patient alert and oriented and in no cardiopulmonary distress.  HEENT: No facial asymmetry, EOMI,     Neck supple .  Chest: Clear to auscultation bilaterally.  CVS: S1, S2 no murmurs, no S3.Regular rate.  ABD: Soft non tender.   Ext: No edema     Assessment & Plan  Primary hypertension Uncontrolled , add amlodipine  5 mg  Total dose of 7.5 mg daily DASH diet and commitment to daily physical activity for a minimum of 30 minutes discussed and encouraged, as a part of hypertension management. The importance of attaining a healthy weight is also discussed.     12/15/2023    9:42 AM 11/06/2023    9:25 AM 11/06/2023    8:46 AM 09/17/2023    2:00 PM 07/13/2023    3:34 PM 06/25/2023    3:05 PM 06/11/2023    9:50 AM  BP/Weight  Systolic BP 153 140 144 108 119 128 124  Diastolic BP 96 90 88 74 73 83 72  Wt. (Lbs) 131.08  131.04 131.4  132.4 132.12 130.9  BMI 20.53 kg/m2  20.52 kg/m2 20.58 kg/m2 20.74 kg/m2 20.69 kg/m2 20.5 kg/m2     Nurse BP check in 4 weeks  OSA (obstructive sleep apnea) Complies with treatment  Depression, major, recurrent (HCC) Controlled, no change in medication

## 2023-12-15 NOTE — Assessment & Plan Note (Signed)
 Controlled, no change in medication

## 2023-12-15 NOTE — Assessment & Plan Note (Signed)
 Complies with treatment

## 2023-12-15 NOTE — Patient Instructions (Addendum)
 Nurse BP check 7/16 in am , medical clearance for right hip surgery will be completed once this is done  F/u WITH Md IN 3 TO 4 MONTHS, CALL IF YOU NEED ME SOONER  Pls schedule mammogram at checkout  Blood pressure still high,   CONTINUE amlodipine  2.5 mg  tablet daily Add on AMLODIPINE  5 MG DAILY  Take both together  Total DAILY DOSE AMLODIPINE  IS 7.5 MG  ALL THE BEST WITH RIGHT HIP REPLACEMENT!  Thanks for choosing [facility], we consider it a privelige to serve you.

## 2023-12-15 NOTE — Assessment & Plan Note (Signed)
 Uncontrolled , add amlodipine  5 mg  Total dose of 7.5 mg daily DASH diet and commitment to daily physical activity for a minimum of 30 minutes discussed and encouraged, as a part of hypertension management. The importance of attaining a healthy weight is also discussed.     12/15/2023    9:42 AM 11/06/2023    9:25 AM 11/06/2023    8:46 AM 09/17/2023    2:00 PM 07/13/2023    3:34 PM 06/25/2023    3:05 PM 06/11/2023    9:50 AM  BP/Weight  Systolic BP 153 140 144 108 119 128 124  Diastolic BP 96 90 88 74 73 83 72  Wt. (Lbs) 131.08  131.04 131.4 132.4 132.12 130.9  BMI 20.53 kg/m2  20.52 kg/m2 20.58 kg/m2 20.74 kg/m2 20.69 kg/m2 20.5 kg/m2     Nurse BP check in 4 weeks

## 2024-01-13 ENCOUNTER — Telehealth: Payer: Self-pay | Admitting: Family Medicine

## 2024-01-13 ENCOUNTER — Ambulatory Visit

## 2024-01-13 ENCOUNTER — Ambulatory Visit: Payer: Medicare Other

## 2024-01-13 VITALS — BP 136/83 | Ht 67.0 in | Wt 133.0 lb

## 2024-01-13 DIAGNOSIS — Z01812 Encounter for preprocedural laboratory examination: Secondary | ICD-10-CM | POA: Diagnosis not present

## 2024-01-13 DIAGNOSIS — Z0000 Encounter for general adult medical examination without abnormal findings: Secondary | ICD-10-CM | POA: Diagnosis not present

## 2024-01-13 DIAGNOSIS — M1611 Unilateral primary osteoarthritis, right hip: Secondary | ICD-10-CM | POA: Diagnosis not present

## 2024-01-13 NOTE — Patient Instructions (Addendum)
 Tamara Lam ,  Thank you for taking time out of your busy schedule to complete your Annual Wellness Visit with me. I enjoyed our conversation and look forward to speaking with you again next year. I, as well as your care team,  appreciate your ongoing commitment to your health goals. Please review the following plan we discussed and let me know if I can assist you in the future.  I enjoyed our conversation and look forward to it again next year. Blessing for the upcoming year!!  -Crystie Yanko     TOGETHER, WE CAME UP WITH THE FOLLOWING GAME PLAN! LETS DO THIS!!!  Referrals: No referrals placed today  Follow up Visits Next PCP Visit: March 23, 2024 at 10:00 am in office 1 Year F/U Wellness Visit: January 16, 2025 at 1:50 am telephone  Clinician Recommendations:  Aim for 30 minutes of exercise or brisk walking, 6-8 glasses of water , and 5 servings of fruits and vegetables each day.       This is a list of the screening recommended for you and due dates:  Health Maintenance  Topic Date Due   COVID-19 Vaccine (5 - 2024-25 season) 03/01/2023   Flu Shot  01/29/2024   Mammogram  03/23/2024   DEXA scan (bone density measurement)  08/22/2024   Medicare Annual Wellness Visit  01/12/2025   Colon Cancer Screening  07/08/2026   DTaP/Tdap/Td vaccine (3 - Td or Tdap) 12/18/2029   Pneumococcal Vaccine for age over 81  Completed   Hepatitis C Screening  Completed   Zoster (Shingles) Vaccine  Completed   Hepatitis B Vaccine  Aged Out   HPV Vaccine  Aged Out   Meningitis B Vaccine  Aged Out    Advanced directives: (Provided) Advance directive discussed with you today. I have provided a copy for you to complete at home and have notarized. Once this is complete, please bring a copy in to our office so we can scan it into your chart.  Advance Care Planning is important because it:  [x]  Makes sure you receive the medical care that is consistent with your values, goals, and preferences  [x]  It provides  guidance to your family and loved ones and reduces their decisional burden about whether or not they are making the right decisions based on your wishes.  Follow the link provided in your after visit summary or read over the paperwork we have mailed to you to help you started getting your Advance Directives in place. If you need assistance in completing these, please reach out to us  so that we can help you!  We will mail you an Advanced Directives packet as we discussed during your visit today. You do not have to have an attorney to complete this paperwork. Read over the packet, discuss it with family, and complete it. Choose who will be making decisions for you in the event you can no longer make them for yourself. Once completed have them notarized, and bring the packet back to the office. We will scan it and make sure it gets into your chart.   If you choose to have a DNR (Do Not Resuscitate Order) you must get this from your primary care doctor because they have to sign it. You can get this in the office during an appointment.   THIS ORDER MUST BE VISIBLE AT ALL TIMES WITHIN YOUR HOME SUCH AS ON A REFRIGERATOR.

## 2024-01-13 NOTE — Progress Notes (Signed)
 Patient is in office today for a nurse visit for Blood Pressure Check. Patients BP was 136/83.

## 2024-01-13 NOTE — Telephone Encounter (Signed)
 SABRA

## 2024-01-13 NOTE — Progress Notes (Signed)
 Subjective:   Tamara Lam is a 73 y.o. who presents for a Medicare Wellness preventive visit.  As a reminder, Annual Wellness Visits don't include a physical exam, and some assessments may be limited, especially if this visit is performed virtually. We may recommend an in-person follow-up visit with your provider if needed.  Visit Complete: Virtual I connected with  [name] on 01/13/24 by a audio enabled telemedicine application and verified that I am speaking with the correct person using two identifiers.  Patient Location: Home  Provider Location: Home Office  I discussed the limitations of evaluation and management by telemedicine. The patient expressed understanding and agreed to proceed.  Vital Signs: Because this visit was a virtual/telehealth visit, some criteria may be missing or patient reported. Any vitals not documented were not able to be obtained and vitals that have been documented are patient reported.  VideoDeclined- This patient declined Librarian, academic. Therefore the visit was completed with audio only.  Persons Participating in Visit: Patient.  AWV Questionnaire: No: Patient Medicare AWV questionnaire was not completed prior to this visit.  Cardiac Risk Factors include: advanced age (>59men, >58 women);dyslipidemia;hypertension     Objective:    Today's Vitals   01/13/24 1243 01/13/24 1244  BP: 136/83   Weight: 133 lb (60.3 kg)   Height: 5' 7 (1.702 m)   PainSc:  8    Body mass index is 20.83 kg/m.     01/13/2024   12:40 PM 01/12/2023   10:19 AM 09/15/2022   11:15 AM 12/23/2021    1:33 PM 07/08/2021    8:44 AM 02/04/2021    9:58 AM 12/19/2020    4:01 PM  Advanced Directives  Does Patient Have a Medical Advance Directive? No No Yes Yes Yes Yes Yes  Type of Surveyor, minerals;Living will;Out of facility DNR (pink MOST or yellow form) Healthcare Power of [place];Living will Living will  Healthcare Power of [place];Living will Healthcare Power of [place];Living will  Does patient want to make changes to medical advance directive?      No - Patient declined No - Patient declined  Copy of Healthcare Power of Attorney in Chart?    Yes - validated most recent copy scanned in chart (See row information)  No - copy requested No - copy requested  Would patient like information on creating a medical advance directive? Yes (MAU/Ambulatory/Procedural Areas - Information given) No - Patient declined         Current Medications (verified) Outpatient Encounter Medications as of 01/13/2024  Medication Sig   amLODipine  (NORVASC ) 2.5 MG tablet Take 1 tablet (2.5 mg total) by mouth daily.   amLODipine  (NORVASC ) 5 MG tablet Take 1 tablet (5 mg total) by mouth daily.   busPIRone  (BUSPAR ) 5 MG tablet Take half tablet twice daily and one at bedtime   Calcium-Magnesium -Vitamin D  (CALCIUM 1200+D3 PO) Take 1 tablet by mouth daily.   escitalopram  (LEXAPRO ) 10 MG tablet TAKE (1) TABLET BY MOUTH AT BEDTIME.   famotidine  (PEPCID ) 20 MG tablet Take 20 mg by mouth daily as needed for heartburn.   latanoprost  (XALATAN ) 0.005 % ophthalmic solution SMARTSIG:In Eye(s)   Multiple Vitamins-Minerals (CENTRUM ADULTS) TABS Take 1 tablet by mouth daily.   pantoprazole  (PROTONIX ) 40 MG tablet Take 1 tablet (40 mg total) by mouth daily before breakfast.   Plecanatide  (TRULANCE ) 3 MG TABS Take 1 tablet (3 mg total) by mouth daily.   simvastatin  (ZOCOR ) 20  MG tablet TAKE 1 TABLET BY MOUTH AT  BEDTIME FOR CHOLESTEROL   triamcinolone  (KENALOG ) 0.025 % cream Apply 1 Application topically 2 (two) times daily.   vitamin C (ASCORBIC ACID) 500 MG tablet Take 500 mg by mouth daily.   No facility-administered encounter medications on file as of 01/13/2024.    Allergies (verified) Patient has no known allergies.   History: Past Medical History:  Diagnosis Date   Abnormal CXR (chest x-ray)    Evaluated with Pulmonary-  Dr. Vonzell- pt. was told probably due to thin body frame-saw no issues-PFT test normal.   Anxiety 1980's    Arthritis    osteoarthritis -knees, hands   Chronic constipation    tx. Linzess    Chronic nausea    Glaucoma 2004   Dr. Cyrus, laser to left eye Sept 2011, bilateral eye drops for this   Helicobacter pylori gastritis 04/2004   last EGD    Hematochezia    Hx of colonoscopy 08/2005   Dr. Shaaron / hemorrhoids    Thyroid  disease    thyroid  goiter, nodules-no problems.   Weight loss    Past Surgical History:  Procedure Laterality Date   ABDOMINAL HYSTERECTOMY     arthoscopic surgery on right knee  2007   BIOPSY  03/03/2018   Procedure: BIOPSY;  Surgeon: Shaaron Lamar HERO, MD;  Location: [facility];  Service: Endoscopy;;  gastric for h. pylori   BREAST BIOPSY Right 08/07/2021   CHOLECYSTECTOMY  1990's   COLONOSCOPY  2011   Dr. Shaaron: anal canal/internal hemorrhoids, dimintuive rectosigmoid polyp (polypoid rectal mucosa), few sigmoid diverticula    COLONOSCOPY N/A 10/30/2015   Dr. Shaaron: diverticulosis, hemorrhoids, surveillance in 2022    COLONOSCOPY WITH PROPOFOL  N/A 07/08/2021   Procedure: COLONOSCOPY WITH PROPOFOL ;  Surgeon: Shaaron Lamar HERO, MD;  Location: [facility];  Service: Endoscopy;  Laterality: N/A;  10:00am   ESOPHAGOGASTRODUODENOSCOPY  2005   Dr. Shaaron: diffuse submucosal petechial hemorrhage of te gastric mucosa, some noncompliance of gastric cavity, failure to insufflate fully, multiple gastric biopsies. gastric bx, h.pylori   ESOPHAGOGASTRODUODENOSCOPY N/A 03/03/2018   Dr. Shaaron: scattered antral erosions/erythema no ulcer/mass. Bx: mild inflammation and No H.pylori   TOTAL KNEE ARTHROPLASTY Right 05/26/2016   Procedure: RIGHT TOTAL KNEE ARTHROPLASTY;  Surgeon: Dempsey Moan, MD;  Location: [facility];  Service: Orthopedics;  Laterality: Right;   VESICOVAGINAL FISTULA CLOSURE W/ TAH  1989   Family History  Problem Relation Age of Onset   Hypertension  Mother    Glaucoma Mother    Diverticulosis Mother    Heart disease Mother    Lung cancer Father    Mental illness Sister    Depression Sister    Anxiety disorder Sister    Cancer Sister    Depression Sister    Anxiety disorder Sister    Depression Sister    Anxiety disorder Sister    Depression Daughter    Leukemia Brother        some form of leukemia   Colon polyps Son    Colon polyps Son        before age 57   Colon cancer Neg Hx    Breast cancer Neg Hx    Social History   Socioeconomic History   Marital status: Widowed    Spouse name: Not on file   Number of children: Not on file   Years of education: Not on file   Highest education level: Not on file  Occupational History  Occupation: Engineer, structural: UNEMPLOYED  Tobacco Use   Smoking status: Never   Smokeless tobacco: Never   Tobacco comments:    Never smoked  Vaping Use   Vaping status: Never Used  Substance and Sexual Activity   Alcohol use: No    Alcohol/week: 0.0 standard drinks of alcohol   Drug use: No   Sexual activity: Not Currently  Other Topics Concern   Not on file  Social History Narrative   Right Handed   Lives in a one story home with her son.    Social Drivers of Corporate investment banker Strain: Low Risk  (01/13/2024)   Overall Financial Resource Strain (CARDIA)    Difficulty of Paying Living Expenses: Not hard at all  Food Insecurity: No Food Insecurity (01/13/2024)   Hunger Vital Sign    Worried About Running Out of Food in the Last Year: Never true    Ran Out of Food in the Last Year: Never true  Transportation Needs: No Transportation Needs (01/13/2024)   PRAPARE - Administrator, Civil Service (Medical): No    Lack of Transportation (Non-Medical): No  Physical Activity: Sufficiently Active (01/13/2024)   Exercise Vital Sign    Days of Exercise per Week: 7 days    Minutes of Exercise per Session: 30 min  Stress: No Stress Concern Present (01/13/2024)    [employer] of Occupational Health - Occupational Stress Questionnaire    Feeling of Stress: Not at all  Social Connections: Socially Integrated (01/13/2024)   Social Connection and Isolation Panel    Frequency of Communication with Friends and Family: More than three times a week    Frequency of Social Gatherings with Friends and Family: More than three times a week    Attends Religious Services: More than 4 times per year    Active Member of [employer] or Organizations: Yes    Attends Engineer, structural: More than 4 times per year    Marital Status: Married    Tobacco Counseling Counseling given: Yes Tobacco comments: Never smoked    Clinical Intake:  Pre-visit preparation completed: Yes  Pain : 0-10 Pain Score: 8  Pain Type: Chronic pain Pain Location: Hip (thigh\) Pain Orientation: Right Pain Descriptors / Indicators: Constant, Nagging Pain Onset: More than a month ago Pain Frequency: Constant     BMI - recorded: 20.83 Nutritional Risks: None Diabetes: No  Lab Results  Component Value Date   HGBA1C 5.7 09/17/2023   HGBA1C 5.8 09/17/2022   HGBA1C 5.9 (H) 01/20/2022     How often do you need to have someone help you when you read instructions, pamphlets, or other written materials from your doctor or pharmacy?: 1 - Never  Interpreter Needed?: No  Information entered by :: A Rahi Chandonnet CMA-AAMA   Activities of Daily Living     01/13/2024   12:53 PM  In your present state of health, do you have any difficulty performing the following activities:  Hearing? 0  Vision? 0  Difficulty concentrating or making decisions? 0  Walking or climbing stairs? 0  Dressing or bathing? 0  Doing errands, shopping? 0  Preparing Food and eating ? N  Using the Toilet? N  In the past six months, have you accidently leaked urine? N  Do you have problems with loss of bowel control? N  Managing your Medications? N  Managing your Finances? N  Housekeeping or managing  your Housekeeping? N    Patient  Care Team: Antonetta Rollene BRAVO, MD as PCP - General Rourk, Lamar HERO, MD as Consulting Physician (Gastroenterology) Lenis Ethelle ORN, MD as Consulting Physician (Endocrinology) Tobie Tonita POUR, DO as Consulting Physician (Neurology) Octavia Bruckner, MD as Consulting Physician (Ophthalmology) Alvan Dorn FALCON, MD as Consulting Physician (Cardiology) Melodi Lerner, MD as Consulting Physician (Orthopedic Surgery) Bonner Ade, MD as Consulting Physician (Physical Medicine and Rehabilitation) Ezzard Sonny GORMAN DEVONNA as Physician Assistant (Gastroenterology) Jude Harden GAILS, MD as Consulting Physician (Pulmonary Disease) Elnor Rome BROCKS, MD as Referring Physician (Specialist)  I have updated your Care Teams any recent Medical Services you may have received from other providers in the past year.     Assessment:   This is a routine wellness examination for [place].  Hearing/Vision screen Hearing Screening - Comments:: Patient denies any hearing difficulties.   Vision Screening - Comments:: Wears rx glasses - up to date with routine eye exams with  Dr. Bruckner Octavia    Goals Addressed               This Visit's Progress     DIET - INCREASE WATER  INTAKE   On track     Drink more water - at least 3 bottles per day       Patient Stated (pt-stated)   On track     To continue to take care of myself and remain as active as I currently am and maintain my health       Depression Screen     01/13/2024   12:57 PM 12/15/2023    9:43 AM 11/06/2023    8:49 AM 06/25/2023    3:06 PM 01/13/2023    2:11 PM 01/12/2023   10:28 AM 08/13/2022    2:50 PM  PHQ 2/9 Scores  PHQ - 2 Score 1 2 2  0 0 0 2  PHQ- 9 Score 3 8 5    5     Fall Risk     01/13/2024   12:52 PM 12/15/2023    9:43 AM 11/06/2023    8:48 AM 06/25/2023    3:06 PM 01/13/2023    2:11 PM  Fall Risk   Falls in the past year? 0 1 0 0 0  Number falls in past yr: 0 0 0 0 0  Injury with Fall?  0 0 0 0 0  Risk for fall due to : No Fall Risks   No Fall Risks No Fall Risks  Follow up Falls evaluation completed;Education provided;Falls prevention discussed Falls evaluation completed Falls evaluation completed Falls evaluation completed Falls evaluation completed    MEDICARE RISK AT HOME:  Medicare Risk at Home Any stairs in or around the home?: Yes If so, are there any without handrails?: No Home free of loose throw rugs in walkways, pet beds, electrical cords, etc?: Yes Adequate lighting in your home to reduce risk of falls?: Yes Life alert?: No Use of a cane, walker or w/c?: No Grab bars in the bathroom?: Yes Shower chair or bench in shower?: No Elevated toilet seat or a handicapped toilet?: No  TIMED UP AND GO:  Was the test performed?  No  Cognitive Function: 6CIT completed        01/13/2024   12:53 PM 01/12/2023   10:24 AM 12/23/2021    1:36 PM 12/19/2019    2:21 PM 12/14/2018    1:18 PM  6CIT Screen  What Year? 0 points 0 points 0 points 0 points 0 points  What month? 0 points 0 points 0  points 0 points 0 points  What time? 0 points 0 points 0 points 0 points 0 points  Count back from 20 0 points 0 points 0 points 0 points 0 points  Months in reverse 0 points 0 points 0 points 0 points 0 points  Repeat phrase 0 points 0 points 0 points 0 points 2 points  Total Score 0 points 0 points 0 points 0 points 2 points    Immunizations Immunization History  Administered Date(s) Administered   Fluad Quad(high Dose 65+) 03/28/2019, 03/27/2021, 03/19/2022   Fluad Trivalent(High Dose 65+) 03/31/2023   Influenza Whole 03/13/2010, 03/12/2011   Influenza,inj,Quad PF,6+ Mos 04/24/2013, 02/20/2014, 03/27/2015, 04/28/2016, 04/14/2017, 02/24/2018, 02/20/2020   Moderna Sars-Covid-2 Vaccination 08/04/2019, 09/02/2019, 05/14/2020, 01/31/2021   Pneumococcal Conjugate-13 06/20/2014   Pneumococcal Polysaccharide-23 05/12/2016   Td 08/16/2009   Tdap 12/19/2019   Zoster  Recombinant(Shingrix) 04/03/2021, 08/10/2021   Zoster, Live 02/17/2011    Screening Tests Health Maintenance  Topic Date Due   COVID-19 Vaccine (5 - 2024-25 season) 03/01/2023   INFLUENZA VACCINE  01/29/2024   MAMMOGRAM  03/23/2024   DEXA SCAN  08/22/2024   Medicare Annual Wellness (AWV)  11/05/2024   Colonoscopy  07/08/2026   DTaP/Tdap/Td (3 - Td or Tdap) 12/18/2029   Pneumococcal Vaccine: 50+ Years  Completed   Hepatitis C Screening  Completed   Zoster Vaccines- Shingrix  Completed   Hepatitis B Vaccines  Aged Out   HPV VACCINES  Aged Out   Meningococcal B Vaccine  Aged Out    Health Maintenance  Health Maintenance Due  Topic Date Due   COVID-19 Vaccine (5 - 2024-25 season) 03/01/2023   Health Maintenance Items Addressed: Patient advised of recommended vaccines and where to obtain those vaccines with verbal understanding  Additional Screening:  Vision Screening: Recommended annual ophthalmology exams for early detection of glaucoma and other disorders of the eye. Would you like a referral to an eye doctor? No    Dental Screening: Recommended annual dental exams for proper oral hygiene  Community Resource Referral / Chronic Care Management: CRR required this visit?  No   CCM required this visit?  No   Plan:    I have personally reviewed and noted the following in the patient's chart:   Medical and social history Use of alcohol, tobacco or illicit drugs  Current medications and supplements including opioid prescriptions. Patient is not currently taking opioid prescriptions. Functional ability and status Nutritional status Physical activity Advanced directives List of other physicians Hospitalizations, surgeries, and ER visits in previous 12 months Vitals Screenings to include cognitive, depression, and falls Referrals and appointments  In addition, I have reviewed and discussed with patient certain preventive protocols, quality metrics, and best practice  recommendations. A written personalized care plan for preventive services as well as general preventive health recommendations were provided to patient.   Nicci Vaughan, CMA   01/13/2024   After Visit Summary: (Mail) Due to this being a telephonic visit, the after visit summary with patients personalized plan was offered to patient via mail   Notes: Nothing significant to report at this time.

## 2024-02-01 DIAGNOSIS — Z961 Presence of intraocular lens: Secondary | ICD-10-CM | POA: Diagnosis not present

## 2024-02-01 DIAGNOSIS — H401131 Primary open-angle glaucoma, bilateral, mild stage: Secondary | ICD-10-CM | POA: Diagnosis not present

## 2024-02-02 DIAGNOSIS — M1611 Unilateral primary osteoarthritis, right hip: Secondary | ICD-10-CM | POA: Diagnosis not present

## 2024-02-10 ENCOUNTER — Telehealth: Payer: Self-pay

## 2024-02-10 NOTE — Telephone Encounter (Signed)
 Copied from CRM [id number]. Topic: Clinical - Medication Question >> Feb 09, 2024  4:03 PM [place] B wrote: Reason for CRM:  Hoy from [facility] called in needs to verify if patient is supposed to be taking the 5mg  and the 2.5mg  of amlodipine 

## 2024-02-11 NOTE — Telephone Encounter (Signed)
 [place] informed

## 2024-02-19 ENCOUNTER — Telehealth (HOSPITAL_BASED_OUTPATIENT_CLINIC_OR_DEPARTMENT_OTHER): Payer: Self-pay

## 2024-02-19 NOTE — Telephone Encounter (Signed)
 CMN received for CPAP supplies signed by provider and faxed confirmation received

## 2024-03-09 DIAGNOSIS — Z5189 Encounter for other specified aftercare: Secondary | ICD-10-CM | POA: Diagnosis not present

## 2024-03-20 ENCOUNTER — Other Ambulatory Visit: Payer: Self-pay | Admitting: Gastroenterology

## 2024-03-23 ENCOUNTER — Ambulatory Visit: Admitting: Family Medicine

## 2024-03-24 ENCOUNTER — Ambulatory Visit

## 2024-03-29 ENCOUNTER — Ambulatory Visit
Admission: RE | Admit: 2024-03-29 | Discharge: 2024-03-29 | Disposition: A | Discharge status: Home / Self Care | Source: Ambulatory Visit | Attending: Family Medicine | Admitting: Family Medicine

## 2024-03-29 DIAGNOSIS — Z1231 Encounter for screening mammogram for malignant neoplasm of breast: Secondary | ICD-10-CM

## 2024-04-01 ENCOUNTER — Ambulatory Visit (INDEPENDENT_AMBULATORY_CARE_PROVIDER_SITE_OTHER)

## 2024-04-01 DIAGNOSIS — Z23 Encounter for immunization: Secondary | ICD-10-CM | POA: Diagnosis not present

## 2024-04-01 NOTE — Progress Notes (Signed)
 Patient is in office today for a nurse visit for Immunization. Patient Injection was given in the  Right deltoid. Patient tolerated injection well.

## 2024-04-06 DIAGNOSIS — L309 Dermatitis, unspecified: Secondary | ICD-10-CM | POA: Diagnosis not present

## 2024-04-06 DIAGNOSIS — L0101 Non-bullous impetigo: Secondary | ICD-10-CM | POA: Diagnosis not present

## 2024-04-06 DIAGNOSIS — L821 Other seborrheic keratosis: Secondary | ICD-10-CM | POA: Diagnosis not present

## 2024-04-15 ENCOUNTER — Ambulatory Visit: Payer: Self-pay

## 2024-04-15 NOTE — Telephone Encounter (Signed)
Mailed normal results

## 2024-05-02 ENCOUNTER — Other Ambulatory Visit: Payer: Self-pay | Admitting: Family Medicine

## 2024-06-18 ENCOUNTER — Other Ambulatory Visit: Payer: Self-pay | Admitting: Family Medicine

## 2024-06-30 ENCOUNTER — Encounter: Payer: Self-pay | Admitting: Gastroenterology

## 2024-07-05 ENCOUNTER — Telehealth: Payer: Self-pay | Admitting: Family Medicine

## 2024-07-05 ENCOUNTER — Other Ambulatory Visit: Payer: Self-pay

## 2024-07-05 DIAGNOSIS — E782 Mixed hyperlipidemia: Secondary | ICD-10-CM

## 2024-07-05 DIAGNOSIS — E559 Vitamin D deficiency, unspecified: Secondary | ICD-10-CM

## 2024-07-05 DIAGNOSIS — R7303 Prediabetes: Secondary | ICD-10-CM

## 2024-07-05 DIAGNOSIS — I10 Essential (primary) hypertension: Secondary | ICD-10-CM

## 2024-07-05 NOTE — Telephone Encounter (Signed)
 Labs orderedPt informed

## 2024-07-05 NOTE — Telephone Encounter (Signed)
 Pls order fasting CBC, lipid, cmp and EGFr, hBA1C and vit D Thanks, get 3 to 5 days before next appt , thanks for calling for lab order

## 2024-07-05 NOTE — Telephone Encounter (Signed)
 Patient wanting to come in for blood work this week so she has the results in before her appointment. Can these orders be put in? Please advise Thanks

## 2024-07-08 ENCOUNTER — Ambulatory Visit: Payer: Self-pay | Admitting: Family Medicine

## 2024-07-08 LAB — CBC WITH DIFFERENTIAL/PLATELET
Basophils Absolute: 0 x10E3/uL (ref 0.0–0.2)
Basos: 1 %
EOS (ABSOLUTE): 0.1 x10E3/uL (ref 0.0–0.4)
Eos: 1 %
Hematocrit: 41.6 % (ref 34.0–46.6)
Hemoglobin: 13.3 g/dL (ref 11.1–15.9)
Immature Grans (Abs): 0 x10E3/uL (ref 0.0–0.1)
Immature Granulocytes: 0 %
Lymphocytes Absolute: 1.8 x10E3/uL (ref 0.7–3.1)
Lymphs: 38 %
MCH: 29 pg (ref 26.6–33.0)
MCHC: 32 g/dL (ref 31.5–35.7)
MCV: 91 fL (ref 79–97)
Monocytes Absolute: 0.5 x10E3/uL (ref 0.1–0.9)
Monocytes: 11 %
Neutrophils Absolute: 2.3 x10E3/uL (ref 1.4–7.0)
Neutrophils: 49 %
Platelets: 194 x10E3/uL (ref 150–450)
RBC: 4.59 x10E6/uL (ref 3.77–5.28)
RDW: 13.6 % (ref 11.7–15.4)
WBC: 4.7 x10E3/uL (ref 3.4–10.8)

## 2024-07-08 LAB — HEMOGLOBIN A1C
Est. average glucose Bld gHb Est-mCnc: 128 mg/dL
Hgb A1c MFr Bld: 6.1 % — ABNORMAL HIGH (ref 4.8–5.6)

## 2024-07-08 LAB — LIPID PANEL
Chol/HDL Ratio: 2.7 ratio (ref 0.0–4.4)
Cholesterol, Total: 168 mg/dL (ref 100–199)
HDL: 63 mg/dL
LDL Chol Calc (NIH): 89 mg/dL (ref 0–99)
Triglycerides: 87 mg/dL (ref 0–149)
VLDL Cholesterol Cal: 16 mg/dL (ref 5–40)

## 2024-07-08 LAB — CMP14+EGFR
ALT: 15 IU/L (ref 0–32)
AST: 19 IU/L (ref 0–40)
Albumin: 4.3 g/dL (ref 3.8–4.8)
Alkaline Phosphatase: 56 IU/L (ref 49–135)
BUN/Creatinine Ratio: 14 (ref 12–28)
BUN: 10 mg/dL (ref 8–27)
Bilirubin Total: 0.4 mg/dL (ref 0.0–1.2)
CO2: 27 mmol/L (ref 20–29)
Calcium: 10 mg/dL (ref 8.7–10.3)
Chloride: 102 mmol/L (ref 96–106)
Creatinine, Ser: 0.7 mg/dL (ref 0.57–1.00)
Globulin, Total: 2.5 g/dL (ref 1.5–4.5)
Glucose: 94 mg/dL (ref 70–99)
Potassium: 4.1 mmol/L (ref 3.5–5.2)
Sodium: 142 mmol/L (ref 134–144)
Total Protein: 6.8 g/dL (ref 6.0–8.5)
eGFR: 91 mL/min/1.73

## 2024-07-08 LAB — VITAMIN D 25 HYDROXY (VIT D DEFICIENCY, FRACTURES): Vit D, 25-Hydroxy: 50.8 ng/mL (ref 30.0–100.0)

## 2024-07-12 ENCOUNTER — Encounter: Payer: Self-pay | Admitting: Internal Medicine

## 2024-07-12 ENCOUNTER — Ambulatory Visit: Admitting: Internal Medicine

## 2024-07-12 VITALS — BP 128/76 | HR 65 | Temp 98.2°F | Ht 67.0 in | Wt 137.0 lb

## 2024-07-12 DIAGNOSIS — K5909 Other constipation: Secondary | ICD-10-CM | POA: Diagnosis not present

## 2024-07-12 DIAGNOSIS — K219 Gastro-esophageal reflux disease without esophagitis: Secondary | ICD-10-CM | POA: Diagnosis not present

## 2024-07-12 DIAGNOSIS — R140 Abdominal distension (gaseous): Secondary | ICD-10-CM

## 2024-07-12 NOTE — Progress Notes (Unsigned)
 "   Gastroenterology Progress Note    Primary Care Physician:  Antonetta Rollene BRAVO, MD Primary Gastroenterologist:  Dr. Shaaron  Pre-Procedure History & Physical: HPI:  Tamara Lam is a 74 y.o. female here for follow-up GERD and constipation.  Did not feel Linzess  290 was producing adequate bowel action.  Went to Trulance  3 mg daily reports she took it for a month felt it did little better then Linzess  but stopped taking it.  She continues to get along with taking MiraLAX  once daily 1 bowel movement every other day sometimes not very productive.  No rectal bleeding.  Colonoscopy 2023 without polyps; due for high risk screening (family history of polyps) 2028.  Past Medical History:  Diagnosis Date   Abnormal CXR (chest x-ray)    Evaluated with Pulmonary- Dr. Vonzell- pt. was told probably due to thin body frame-saw no issues-PFT test normal.   Anxiety 1980's    Arthritis    osteoarthritis -knees, hands   Chronic constipation    tx. Linzess    Chronic nausea    Glaucoma 2004   Dr. Cyrus, laser to left eye Sept 2011, bilateral eye drops for this   Helicobacter pylori gastritis 04/2004   last EGD    Hematochezia    Hx of colonoscopy 08/2005   Dr. Shaaron / hemorrhoids    Thyroid  disease    thyroid  goiter, nodules-no problems.   Weight loss     Past Surgical History:  Procedure Laterality Date   ABDOMINAL HYSTERECTOMY     arthoscopic surgery on right knee  2007   BIOPSY  03/03/2018   Procedure: BIOPSY;  Surgeon: Shaaron Lamar HERO, MD;  Location: [facility];  Service: Endoscopy;;  gastric for h. pylori   BREAST BIOPSY Right 08/07/2021   CHOLECYSTECTOMY  1990's   COLONOSCOPY  2011   Dr. Shaaron: anal canal/internal hemorrhoids, dimintuive rectosigmoid polyp (polypoid rectal mucosa), few sigmoid diverticula    COLONOSCOPY N/A 10/30/2015   Dr. Shaaron: diverticulosis, hemorrhoids, surveillance in 2022    COLONOSCOPY WITH PROPOFOL  N/A 07/08/2021   Procedure: COLONOSCOPY WITH  PROPOFOL ;  Surgeon: Shaaron Lamar HERO, MD;  Location: [facility];  Service: Endoscopy;  Laterality: N/A;  10:00am   ESOPHAGOGASTRODUODENOSCOPY  2005   Dr. Shaaron: diffuse submucosal petechial hemorrhage of te gastric mucosa, some noncompliance of gastric cavity, failure to insufflate fully, multiple gastric biopsies. gastric bx, h.pylori   ESOPHAGOGASTRODUODENOSCOPY N/A 03/03/2018   Dr. Shaaron: scattered antral erosions/erythema no ulcer/mass. Bx: mild inflammation and No H.pylori   TOTAL KNEE ARTHROPLASTY Right 05/26/2016   Procedure: RIGHT TOTAL KNEE ARTHROPLASTY;  Surgeon: Dempsey Moan, MD;  Location: [facility];  Service: Orthopedics;  Laterality: Right;   VESICOVAGINAL FISTULA CLOSURE W/ TAH  1989    Prior to Admission medications  Medication Sig Start Date End Date Taking? Authorizing Provider  amLODipine  (NORVASC ) 2.5 MG tablet Take 1 tablet (2.5 mg total) by mouth daily. (along with 5 mg) 05/02/24  Yes Antonetta Rollene BRAVO, MD  amLODipine  (NORVASC ) 5 MG tablet TAKE ONE TABLET BY MOUTH DAILY (along with 2.5 mg) 05/02/24  Yes Antonetta Rollene BRAVO, MD  Calcium-Magnesium -Vitamin D  (CALCIUM 1200+D3 PO) Take 1 tablet by mouth daily.   Yes [provider]  escitalopram  (LEXAPRO ) 10 MG tablet TAKE (1) TABLET BY MOUTH AT BEDTIME. 06/20/24  Yes Antonetta Rollene BRAVO, MD  famotidine  (PEPCID ) 20 MG tablet Take 20 mg by mouth daily as needed for heartburn.   Yes [provider]  latanoprost  (XALATAN ) 0.005 % ophthalmic  solution SMARTSIG:In Eye(s) 02/07/23  Yes [provider]  Multiple Vitamins-Minerals (CENTRUM ADULTS) TABS Take 1 tablet by mouth daily.   Yes [provider]  pantoprazole  (PROTONIX ) 40 MG tablet Take 1 tablet (40 mg total) by mouth daily before breakfast. 03/21/24  Yes Ezzard [name], PA-C  Plecanatide  (TRULANCE ) 3 MG TABS Take 1 tablet (3 mg total) by mouth daily. Patient taking differently: Take 3 mg by mouth daily as needed. 07/13/23  Yes Ezzard [name],  PA-C  simvastatin  (ZOCOR ) 20 MG tablet TAKE 1 TABLET BY MOUTH AT  BEDTIME FOR CHOLESTEROL 11/24/23  Yes Antonetta Rollene BRAVO, MD  vitamin C (ASCORBIC ACID) 500 MG tablet Take 500 mg by mouth daily.   Yes [provider]    Allergies as of 07/12/2024   (No Known Allergies)    Family History  Problem Relation Age of Onset   Hypertension Mother    Glaucoma Mother    Diverticulosis Mother    Heart disease Mother    Lung cancer Father    Mental illness Sister    Depression Sister    Anxiety disorder Sister    Cancer Sister    Depression Sister    Anxiety disorder Sister    Depression Sister    Anxiety disorder Sister    Depression Daughter    Leukemia Brother        some form of leukemia   Colon polyps Son    Colon polyps Son        before age 65   Colon cancer Neg Hx    Breast cancer Neg Hx     Social History   Socioeconomic History   Marital status: Widowed    Spouse name: Not on file   Number of children: Not on file   Years of education: Not on file   Highest education level: Not on file  Occupational History   Occupation: homemaker     Employer: UNEMPLOYED  Tobacco Use   Smoking status: Never   Smokeless tobacco: Never   Tobacco comments:    Never smoked  Vaping Use   Vaping status: Never Used  Substance and Sexual Activity   Alcohol use: No    Alcohol/week: 0.0 standard drinks of alcohol   Drug use: No   Sexual activity: Not Currently  Other Topics Concern   Not on file  Social History Narrative   Right Handed   Lives in a one story home with her son.    Social Drivers of Health   Tobacco Use: Low Risk (07/12/2024)   Patient History    Smoking Tobacco Use: Never    Smokeless Tobacco Use: Never    Passive Exposure: Not on file  Financial Resource Strain: Low Risk (01/13/2024)   Overall Financial Resource Strain (CARDIA)    Difficulty of Paying Living Expenses: Not hard at all  Food Insecurity: No Food Insecurity (01/13/2024)   Epic     Worried About Programme Researcher, Broadcasting/film/video in the Last Year: Never true    Ran Out of Food in the Last Year: Never true  Transportation Needs: No Transportation Needs (01/13/2024)   Epic    Lack of Transportation (Medical): No    Lack of Transportation (Non-Medical): No  Physical Activity: Sufficiently Active (01/13/2024)   Exercise Vital Sign    Days of Exercise per Week: 7 days    Minutes of Exercise per Session: 30 min  Stress: No Stress Concern Present (01/13/2024)   [employer] of Occupational  Health - Occupational Stress Questionnaire    Feeling of Stress: Not at all  Social Connections: Socially Integrated (01/13/2024)   Social Connection and Isolation Panel    Frequency of Communication with Friends and Family: More than three times a week    Frequency of Social Gatherings with Friends and Family: More than three times a week    Attends Religious Services: More than 4 times per year    Active Member of Clubs or Organizations: Yes    Attends Banker Meetings: More than 4 times per year    Marital Status: Married  Catering Manager Violence: Not At Risk (01/13/2024)   Epic    Fear of Current or Ex-Partner: No    Emotionally Abused: No    Physically Abused: No    Sexually Abused: No  Depression (PHQ2-9): Low Risk (01/13/2024)   Depression (PHQ2-9)    PHQ-2 Score: 3  Recent Concern: Depression (PHQ2-9) - Medium Risk (12/15/2023)   Depression (PHQ2-9)    PHQ-2 Score: 8  Alcohol Screen: Low Risk (01/13/2024)   Alcohol Screen    Last Alcohol Screening Score (AUDIT): 0  Housing: Low Risk (01/13/2024)   Epic    Unable to Pay for Housing in the Last Year: No    Number of Times Moved in the Last Year: 0    Homeless in the Last Year: No  Utilities: Not At Risk (01/13/2024)   Epic    Threatened with loss of utilities: No  Health Literacy: Adequate Health Literacy (01/13/2024)   B1300 Health Literacy    Frequency of need for help with medical instructions: Never    Review of  Systems   See HPI, otherwise negative ROS  Physical Exam: BP 128/76   Pulse 65   Temp 98.2 F (36.8 C) (Oral)   Ht 5' 7 (1.702 m)   Wt 137 lb (62.1 kg)   SpO2 98%   BMI 21.46 kg/m  General:   Alert,  Well-developed, well-nourished, pleasant and cooperative in NAD Neck:  Supple; no masses or thyromegaly. No significant cervical adenopathy. Lungs:  Clear throughout to auscultation.   No wheezes, crackles, or rhonchi. No acute distress. Heart:  Regular rate and rhythm; no murmurs, clicks, rubs,  or gallops. Abdomen: Non-distended, normal bowel sounds.  Soft and nontender without appreciable mass or hepatosplenomegaly.   Impression/Plan:   Pleasant 74 year old lady with chronic constipation  - partial response to Linzess  and subsequently Trulance .  Feels she is not quite there as far as optimal management is concerned.  No alarm features.  GERD suboptimally controlled on pantoprazole  40 mg daily.  No dysphagia.  Recommendations:   For now we will increase the pantoprazole  Protonix  to 40 mg twice daily before breakfast and supper (dispense 60 with 3 additional refills)  Hold up on taking any more Trulance   Increase MiraLAX  to 17 g or 1 capful twice daily without fail as a matter of routine to facilitate better bowel function.  This should help your gas and bloating as well.  Follow-up colonoscopy due 2028  Office visit here in 3 months.    Notice: This dictation was prepared with Dragon dictation along with smaller phrase technology. Any transcriptional errors that result from this process are unintentional and may not be corrected upon review.  "

## 2024-07-12 NOTE — Patient Instructions (Signed)
 It was nice to see you again today  For now we will increase the pantoprazole  Protonix  to 40 mg twice daily before breakfast and supper (dispense 60 with 3 additional refills)  Hold up on taking any more Trulance   Increase MiraLAX  to 17 g or 1 capful twice daily without fail as a matter of routine to facilitate better bowel function.  This should help your gas and bloating as well.  Follow-up colonoscopy due 2028  Office visit here in 3 months.

## 2024-07-13 ENCOUNTER — Encounter: Payer: Self-pay | Admitting: Family Medicine

## 2024-07-13 ENCOUNTER — Ambulatory Visit (INDEPENDENT_AMBULATORY_CARE_PROVIDER_SITE_OTHER): Admitting: Family Medicine

## 2024-07-13 VITALS — BP 119/73 | HR 66 | Resp 16 | Ht 67.0 in | Wt 136.1 lb

## 2024-07-13 DIAGNOSIS — E782 Mixed hyperlipidemia: Secondary | ICD-10-CM

## 2024-07-13 DIAGNOSIS — G4733 Obstructive sleep apnea (adult) (pediatric): Secondary | ICD-10-CM

## 2024-07-13 DIAGNOSIS — F3341 Major depressive disorder, recurrent, in partial remission: Secondary | ICD-10-CM

## 2024-07-13 DIAGNOSIS — M79672 Pain in left foot: Secondary | ICD-10-CM | POA: Diagnosis not present

## 2024-07-13 DIAGNOSIS — M79671 Pain in right foot: Secondary | ICD-10-CM

## 2024-07-13 DIAGNOSIS — R202 Paresthesia of skin: Secondary | ICD-10-CM | POA: Diagnosis not present

## 2024-07-13 DIAGNOSIS — M25552 Pain in left hip: Secondary | ICD-10-CM

## 2024-07-13 DIAGNOSIS — G8929 Other chronic pain: Secondary | ICD-10-CM | POA: Diagnosis not present

## 2024-07-13 MED ORDER — PREDNISONE 5 MG PO TABS: 5.0000 mg | ORAL_TABLET | Freq: Two times a day (BID) | ORAL | 0 refills | Status: AC

## 2024-07-13 NOTE — Assessment & Plan Note (Signed)
 Needs local Provider

## 2024-07-13 NOTE — Assessment & Plan Note (Signed)
 Progressively worsening refer Ortho

## 2024-07-13 NOTE — Patient Instructions (Addendum)
 Annual exam mid to end August   You are referred to Pulmonary locally and to Dr Beckie  11 to 19 mm HG compression hose to be prescribed and sent to [facility]  Please reduce sweets   3 day course of prednisone  prescribed for burning pain  Thanks for choosing [facility], we consider it a privelige to serve you.

## 2024-07-18 NOTE — Progress Notes (Signed)
" ° °  Tamara Lam     MRN: [id number]      DOB: Oct 08, 1950  Chief Complaint  Patient presents with   Medical Management of Chronic Issues    Follow up    Foot Pain    Pt complains of ongoing bilateral foot and ankle pain and swelling     HPI [name] is here for follow up and re-evaluation of chronic medical conditions, medication management and review of any available recent lab and radiology data.  Preventive health is updated, specifically  Cancer screening and Immunization.   Questions or concerns regarding consultations or procedures which the PT has had in the interim are  addressed. The PT denies any adverse reactions to current medications since the last visit.  Concern as above   ROS Denies recent fever or chills. Denies sinus pressure, nasal congestion, ear pain or sore throat. Denies chest congestion, productive cough or wheezing. Denies chest pains, palpitations , c/o ankle swelling left Denies abdominal pain, nausea, vomiting,diarrhea or constipation.   Denies dysuria, frequency, hesitancy or incontinence. C/o new left hip pain, no inciting trauma Denies depression, anxiety or insomnia. Denies skin break down or rash.   PE  BP 119/73   Pulse 66   Resp 16   Ht 5' 7 (1.702 m)   Wt 136 lb 1.9 oz (61.7 kg)   SpO2 96%   BMI 21.32 kg/m   Patient alert and oriented and in no cardiopulmonary distress.  HEENT: No facial asymmetry, EOMI,     Neck supple .  Chest: Clear to auscultation bilaterally.  CVS: S1, S2 no murmurs, no S3.Regular rate.  ABD: Soft non tender.   Ext: No edema  MS: Adequate ROM spine, shoulders,  and knees.Decreased in left hip  Skin: Intact, no ulcerations or rash noted.  Psych: Good eye contact, normal affect. Memory intact not anxious or depressed appearing.  CNS: CN 2-12 intact, power,  normal throughout.no focal deficits noted.   Assessment & Plan  OSA (obstructive sleep apnea) Needs local Provider  Hip pain, chronic,  left Progressively worsening refer Ortho  Foot pain, bilateral Burning pain , 3 day course of low dose prednisone  and compression hose  Tingling of both feet 3 day course of prednisone  prescribed  Mixed hyperlipidemia Hyperlipidemia:Low fat diet discussed and encouraged.   Lipid Panel  Lab Results  Component Value Date   CHOL 168 07/07/2024   HDL 63 07/07/2024   LDLCALC 89 07/07/2024   TRIG 87 07/07/2024   CHOLHDL 2.7 07/07/2024     Controlled, no change in management  Depression, major, recurrent Controlled, no change in medication   MDD (major depressive disorder), recurrent, in partial remission Controlled, no change in medication   "

## 2024-07-18 NOTE — Assessment & Plan Note (Signed)
 Controlled, no change in medication

## 2024-07-18 NOTE — Assessment & Plan Note (Signed)
 Hyperlipidemia:Low fat diet discussed and encouraged.   Lipid Panel  Lab Results  Component Value Date   CHOL 168 07/07/2024   HDL 63 07/07/2024   LDLCALC 89 07/07/2024   TRIG 87 07/07/2024   CHOLHDL 2.7 07/07/2024     Controlled, no change in management

## 2024-07-18 NOTE — Assessment & Plan Note (Signed)
 3 day course of prednisone  prescribed

## 2024-07-18 NOTE — Assessment & Plan Note (Signed)
 Burning pain , 3 day course of low dose prednisone  and compression hose

## 2024-09-07 ENCOUNTER — Ambulatory Visit: Admitting: Pulmonary Disease

## 2024-09-19 ENCOUNTER — Ambulatory Visit: Admitting: "Endocrinology

## 2025-01-16 ENCOUNTER — Ambulatory Visit

## 2025-02-22 ENCOUNTER — Encounter: Payer: Self-pay | Admitting: Family Medicine
